# Patient Record
Sex: Female | Born: 1950 | Race: White | Hispanic: No | Marital: Married | Smoking: Never smoker
Health system: Southern US, Community
[De-identification: ages and names within clinical notes are randomized; demographics above are authoritative.]

## PROBLEM LIST (undated history)

## (undated) DIAGNOSIS — E119 Type 2 diabetes mellitus without complications: Secondary | ICD-10-CM

## (undated) DIAGNOSIS — E785 Hyperlipidemia, unspecified: Secondary | ICD-10-CM

## (undated) DIAGNOSIS — E039 Hypothyroidism, unspecified: Secondary | ICD-10-CM

## (undated) DIAGNOSIS — M1990 Unspecified osteoarthritis, unspecified site: Secondary | ICD-10-CM

## (undated) DIAGNOSIS — R918 Other nonspecific abnormal finding of lung field: Secondary | ICD-10-CM

## (undated) DIAGNOSIS — R569 Unspecified convulsions: Secondary | ICD-10-CM

## (undated) DIAGNOSIS — N6019 Diffuse cystic mastopathy of unspecified breast: Secondary | ICD-10-CM

## (undated) DIAGNOSIS — I10 Essential (primary) hypertension: Secondary | ICD-10-CM

## (undated) DIAGNOSIS — B009 Herpesviral infection, unspecified: Secondary | ICD-10-CM

## (undated) DIAGNOSIS — G4730 Sleep apnea, unspecified: Secondary | ICD-10-CM

## (undated) DIAGNOSIS — K5790 Diverticulosis of intestine, part unspecified, without perforation or abscess without bleeding: Secondary | ICD-10-CM

## (undated) DIAGNOSIS — E7800 Pure hypercholesterolemia, unspecified: Secondary | ICD-10-CM

## (undated) DIAGNOSIS — F40240 Claustrophobia: Secondary | ICD-10-CM

## (undated) DIAGNOSIS — M75100 Unspecified rotator cuff tear or rupture of unspecified shoulder, not specified as traumatic: Secondary | ICD-10-CM

## (undated) DIAGNOSIS — R7303 Prediabetes: Secondary | ICD-10-CM

## (undated) DIAGNOSIS — K219 Gastro-esophageal reflux disease without esophagitis: Secondary | ICD-10-CM

## (undated) DIAGNOSIS — E559 Vitamin D deficiency, unspecified: Secondary | ICD-10-CM

## (undated) DIAGNOSIS — D649 Anemia, unspecified: Secondary | ICD-10-CM

## (undated) HISTORY — PX: KNEE ARTHROSCOPY: SHX127

## (undated) HISTORY — PX: DG FOOT HEEL (ARMC HX): HXRAD1517

## (undated) HISTORY — PX: CHOLECYSTECTOMY: SHX55

## (undated) HISTORY — PX: ABDOMINAL HYSTERECTOMY: SHX81

## (undated) HISTORY — PX: DILATION AND CURETTAGE OF UTERUS: SHX78

## (undated) HISTORY — PX: BREAST EXCISIONAL BIOPSY: SUR124

## (undated) HISTORY — PX: JOINT REPLACEMENT: SHX530

---

## 2005-02-14 ENCOUNTER — Ambulatory Visit: Payer: Self-pay

## 2006-03-11 ENCOUNTER — Ambulatory Visit: Payer: Self-pay

## 2007-04-20 ENCOUNTER — Ambulatory Visit: Payer: Self-pay | Admitting: Internal Medicine

## 2008-03-13 ENCOUNTER — Observation Stay: Payer: Self-pay | Admitting: Internal Medicine

## 2008-03-18 ENCOUNTER — Emergency Department: Payer: Self-pay | Admitting: Emergency Medicine

## 2008-03-18 ENCOUNTER — Other Ambulatory Visit: Payer: Self-pay

## 2008-06-27 ENCOUNTER — Ambulatory Visit: Payer: Self-pay | Admitting: Internal Medicine

## 2009-03-14 ENCOUNTER — Ambulatory Visit: Payer: Self-pay | Admitting: Orthopedic Surgery

## 2009-03-15 ENCOUNTER — Ambulatory Visit: Payer: Self-pay | Admitting: Orthopedic Surgery

## 2009-06-29 ENCOUNTER — Ambulatory Visit: Payer: Self-pay | Admitting: Internal Medicine

## 2010-06-28 ENCOUNTER — Emergency Department: Payer: Self-pay | Admitting: Unknown Physician Specialty

## 2010-07-01 ENCOUNTER — Ambulatory Visit: Payer: Self-pay | Admitting: Internal Medicine

## 2010-08-04 ENCOUNTER — Encounter: Payer: Self-pay | Admitting: Orthopedic Surgery

## 2010-09-26 ENCOUNTER — Ambulatory Visit: Payer: Self-pay | Admitting: Internal Medicine

## 2011-01-27 ENCOUNTER — Ambulatory Visit: Payer: Self-pay | Admitting: Internal Medicine

## 2011-06-03 ENCOUNTER — Ambulatory Visit: Payer: Self-pay | Admitting: Internal Medicine

## 2011-07-31 ENCOUNTER — Ambulatory Visit: Payer: Self-pay | Admitting: Internal Medicine

## 2012-02-09 ENCOUNTER — Ambulatory Visit: Payer: Self-pay | Admitting: Internal Medicine

## 2012-03-14 ENCOUNTER — Emergency Department: Payer: Self-pay | Admitting: Internal Medicine

## 2012-06-03 ENCOUNTER — Ambulatory Visit: Payer: Self-pay | Admitting: Internal Medicine

## 2012-08-19 ENCOUNTER — Ambulatory Visit: Payer: Self-pay | Admitting: Internal Medicine

## 2012-12-06 ENCOUNTER — Emergency Department: Payer: Self-pay | Admitting: Emergency Medicine

## 2012-12-06 LAB — COMPREHENSIVE METABOLIC PANEL
Albumin: 3.8 g/dL (ref 3.4–5.0)
Alkaline Phosphatase: 75 U/L (ref 50–136)
Anion Gap: 8 (ref 7–16)
BUN: 10 mg/dL (ref 7–18)
Bilirubin,Total: 0.4 mg/dL (ref 0.2–1.0)
Calcium, Total: 9 mg/dL (ref 8.5–10.1)
Chloride: 107 mmol/L (ref 98–107)
Co2: 26 mmol/L (ref 21–32)
Creatinine: 0.76 mg/dL (ref 0.60–1.30)
EGFR (African American): >60
EGFR (Non-African Amer.): >60
Glucose: 118 mg/dL — ABNORMAL HIGH (ref 65–99)
Osmolality: 281 (ref 275–301)
Potassium: 3.6 mmol/L (ref 3.5–5.1)
SGOT(AST): 31 U/L (ref 15–37)
SGPT (ALT): 26 U/L (ref 12–78)
Sodium: 141 mmol/L (ref 136–145)
Total Protein: 7.6 g/dL (ref 6.4–8.2)

## 2012-12-06 LAB — CBC
HCT: 38.4 % (ref 35.0–47.0)
HGB: 13.1 g/dL (ref 12.0–16.0)
MCH: 31.3 pg (ref 26.0–34.0)
MCHC: 34.2 g/dL (ref 32.0–36.0)
MCV: 91 fL (ref 80–100)
Platelet: 245 10*3/uL (ref 150–440)
RBC: 4.2 10*6/uL (ref 3.80–5.20)
RDW: 14.7 % — ABNORMAL HIGH (ref 11.5–14.5)
WBC: 7.3 10*3/uL (ref 3.6–11.0)

## 2012-12-31 ENCOUNTER — Ambulatory Visit: Payer: Self-pay | Admitting: Internal Medicine

## 2013-01-26 ENCOUNTER — Ambulatory Visit: Payer: Self-pay | Admitting: Gastroenterology

## 2013-01-27 LAB — PATHOLOGY REPORT

## 2013-02-15 ENCOUNTER — Ambulatory Visit: Payer: Self-pay | Admitting: Internal Medicine

## 2013-06-07 ENCOUNTER — Ambulatory Visit: Payer: Self-pay | Admitting: Internal Medicine

## 2014-02-01 ENCOUNTER — Emergency Department: Payer: Self-pay | Admitting: Emergency Medicine

## 2014-02-01 LAB — CBC WITH DIFFERENTIAL/PLATELET
Basophil #: 0.1 10*3/uL (ref 0.0–0.1)
Basophil %: 0.9 %
Eosinophil #: 0.2 10*3/uL (ref 0.0–0.7)
Eosinophil %: 2.3 %
HCT: 39.3 % (ref 35.0–47.0)
HGB: 12.9 g/dL (ref 12.0–16.0)
Lymphocyte #: 4.8 10*3/uL — ABNORMAL HIGH (ref 1.0–3.6)
Lymphocyte %: 52 %
MCH: 31.2 pg (ref 26.0–34.0)
MCHC: 32.8 g/dL (ref 32.0–36.0)
MCV: 95 fL (ref 80–100)
Monocyte #: 0.6 x10 3/mm (ref 0.2–0.9)
Monocyte %: 6.6 %
Neutrophil #: 3.5 10*3/uL (ref 1.4–6.5)
Neutrophil %: 38.2 %
Platelet: 269 10*3/uL (ref 150–440)
RBC: 4.13 10*6/uL (ref 3.80–5.20)
RDW: 13.7 % (ref 11.5–14.5)
WBC: 9.3 10*3/uL (ref 3.6–11.0)

## 2014-02-01 LAB — BASIC METABOLIC PANEL
Anion Gap: 9 (ref 7–16)
BUN: 25 mg/dL — ABNORMAL HIGH (ref 7–18)
Calcium, Total: 8.6 mg/dL (ref 8.5–10.1)
Chloride: 107 mmol/L (ref 98–107)
Co2: 24 mmol/L (ref 21–32)
Creatinine: 1.19 mg/dL (ref 0.60–1.30)
EGFR (African American): 56 — ABNORMAL LOW
EGFR (Non-African Amer.): 48 — ABNORMAL LOW
Glucose: 127 mg/dL — ABNORMAL HIGH (ref 65–99)
Osmolality: 285 (ref 275–301)
Potassium: 3.5 mmol/L (ref 3.5–5.1)
Sodium: 140 mmol/L (ref 136–145)

## 2014-05-26 DIAGNOSIS — E7800 Pure hypercholesterolemia, unspecified: Secondary | ICD-10-CM | POA: Insufficient documentation

## 2014-06-12 ENCOUNTER — Ambulatory Visit: Payer: Self-pay | Admitting: Internal Medicine

## 2014-06-26 ENCOUNTER — Emergency Department: Payer: Self-pay | Admitting: Emergency Medicine

## 2015-03-21 ENCOUNTER — Encounter: Payer: Self-pay | Admitting: *Deleted

## 2015-03-22 ENCOUNTER — Encounter
Admission: RE | Disposition: A | Discharge status: Home / Self Care | Payer: Self-pay | Source: Ambulatory Visit | Attending: Gastroenterology

## 2015-03-22 ENCOUNTER — Ambulatory Visit
Admission: RE | Admit: 2015-03-22 | Discharge: 2015-03-22 | Disposition: A | Discharge status: Home / Self Care | Payer: BLUE CROSS/BLUE SHIELD | Source: Ambulatory Visit | Attending: Gastroenterology | Admitting: Gastroenterology

## 2015-03-22 ENCOUNTER — Ambulatory Visit: Payer: BLUE CROSS/BLUE SHIELD | Admitting: Anesthesiology

## 2015-03-22 DIAGNOSIS — R1310 Dysphagia, unspecified: Secondary | ICD-10-CM | POA: Insufficient documentation

## 2015-03-22 DIAGNOSIS — K635 Polyp of colon: Secondary | ICD-10-CM | POA: Insufficient documentation

## 2015-03-22 DIAGNOSIS — K219 Gastro-esophageal reflux disease without esophagitis: Secondary | ICD-10-CM | POA: Insufficient documentation

## 2015-03-22 DIAGNOSIS — Z6837 Body mass index (BMI) 37.0-37.9, adult: Secondary | ICD-10-CM | POA: Diagnosis not present

## 2015-03-22 DIAGNOSIS — E119 Type 2 diabetes mellitus without complications: Secondary | ICD-10-CM | POA: Diagnosis not present

## 2015-03-22 DIAGNOSIS — K5730 Diverticulosis of large intestine without perforation or abscess without bleeding: Secondary | ICD-10-CM | POA: Insufficient documentation

## 2015-03-22 DIAGNOSIS — I10 Essential (primary) hypertension: Secondary | ICD-10-CM | POA: Diagnosis not present

## 2015-03-22 DIAGNOSIS — E559 Vitamin D deficiency, unspecified: Secondary | ICD-10-CM | POA: Insufficient documentation

## 2015-03-22 DIAGNOSIS — E785 Hyperlipidemia, unspecified: Secondary | ICD-10-CM | POA: Insufficient documentation

## 2015-03-22 DIAGNOSIS — E780 Pure hypercholesterolemia: Secondary | ICD-10-CM | POA: Insufficient documentation

## 2015-03-22 DIAGNOSIS — Z800 Family history of malignant neoplasm of digestive organs: Secondary | ICD-10-CM | POA: Diagnosis not present

## 2015-03-22 DIAGNOSIS — G4730 Sleep apnea, unspecified: Secondary | ICD-10-CM | POA: Diagnosis not present

## 2015-03-22 DIAGNOSIS — E039 Hypothyroidism, unspecified: Secondary | ICD-10-CM | POA: Diagnosis not present

## 2015-03-22 HISTORY — DX: Type 2 diabetes mellitus without complications: E11.9

## 2015-03-22 HISTORY — DX: Vitamin D deficiency, unspecified: E55.9

## 2015-03-22 HISTORY — DX: Herpesviral infection, unspecified: B00.9

## 2015-03-22 HISTORY — DX: Diffuse cystic mastopathy of unspecified breast: N60.19

## 2015-03-22 HISTORY — DX: Gastro-esophageal reflux disease without esophagitis: K21.9

## 2015-03-22 HISTORY — DX: Hyperlipidemia, unspecified: E78.5

## 2015-03-22 HISTORY — PX: ESOPHAGOGASTRODUODENOSCOPY (EGD) WITH PROPOFOL: SHX5813

## 2015-03-22 HISTORY — DX: Pure hypercholesterolemia, unspecified: E78.00

## 2015-03-22 HISTORY — DX: Sleep apnea, unspecified: G47.30

## 2015-03-22 HISTORY — DX: Essential (primary) hypertension: I10

## 2015-03-22 HISTORY — DX: Hypothyroidism, unspecified: E03.9

## 2015-03-22 HISTORY — PX: COLONOSCOPY WITH PROPOFOL: SHX5780

## 2015-03-22 SURGERY — COLONOSCOPY WITH PROPOFOL
Anesthesia: General

## 2015-03-22 MED ORDER — SODIUM CHLORIDE 0.9 % IV SOLN
INTRAVENOUS | Status: DC
Start: 2015-03-22 — End: 2015-03-22
  Administered 2015-03-22 (×2): via INTRAVENOUS

## 2015-03-22 MED ORDER — PROPOFOL INFUSION 10 MG/ML OPTIME
INTRAVENOUS | Status: DC | PRN
  Administered 2015-03-22: 125 ug/kg/min via INTRAVENOUS

## 2015-03-22 MED ORDER — PROPOFOL 10 MG/ML IV BOLUS
INTRAVENOUS | Status: DC | PRN
  Administered 2015-03-22: 100 mg via INTRAVENOUS

## 2015-03-22 MED ORDER — GLYCOPYRROLATE 0.2 MG/ML IJ SOLN
INTRAMUSCULAR | Status: DC | PRN
  Administered 2015-03-22: 0.2 mg via INTRAVENOUS

## 2015-03-22 MED ORDER — LIDOCAINE HCL (CARDIAC) 20 MG/ML IV SOLN
INTRAVENOUS | Status: DC | PRN
  Administered 2015-03-22: 20 mg via INTRAVENOUS

## 2015-03-22 NOTE — Transfer of Care (Signed)
Immediate Anesthesia Transfer of Care Note  Patient: Dana Morton  Procedure(s) Performed: Procedure(s): COLONOSCOPY WITH PROPOFOL (N/A) ESOPHAGOGASTRODUODENOSCOPY (EGD) WITH PROPOFOL (N/A)  Patient Location: Endoscopy Unit  Anesthesia Type:General  Level of Consciousness: awake and oriented  Airway & Oxygen Therapy: Patient Spontanous Breathing and Patient connected to nasal cannula oxygen  Post-op Assessment: Report given to RN and Post -op Vital signs reviewed and stable  Post vital signs: Reviewed and stable  Last Vitals:  Filed Vitals:   03/22/15 0953  BP: 133/65  Pulse: 69  Temp: 35.9 C  Resp: 20    Complications: No apparent anesthesia complications

## 2015-03-22 NOTE — Anesthesia Preprocedure Evaluation (Signed)
Anesthesia Evaluation  Patient identified by MRN, date of birth, ID band Patient awake    Reviewed: Allergy & Precautions, H&P , NPO status , Patient's Chart, lab work & pertinent test results, reviewed documented beta blocker date and time   History of Anesthesia Complications Negative for: history of anesthetic complications  Airway Mallampati: IV  TM Distance: >3 FB Neck ROM: full  Mouth opening: Limited Mouth Opening  Dental no notable dental hx. (+) Teeth Intact   Pulmonary neg shortness of breath, sleep apnea , neg COPD, neg recent URI,    Pulmonary exam normal breath sounds clear to auscultation       Cardiovascular Exercise Tolerance: Good hypertension, On Medications (-) angina(-) CAD, (-) Past MI, (-) Cardiac Stents and (-) CABG Normal cardiovascular exam(-) dysrhythmias (-) Valvular Problems/Murmurs Rhythm:regular Rate:Normal     Neuro/Psych negative neurological ROS  negative psych ROS   GI/Hepatic Neg liver ROS, GERD  Medicated and Controlled,  Endo/Other  diabetesHypothyroidism Morbid obesity  Renal/GU negative Renal ROS  negative genitourinary   Musculoskeletal   Abdominal   Peds  Hematology negative hematology ROS (+)   Anesthesia Other Findings Past Medical History:   Hypertension                                                 Herpes simplex                                               Fibrocystic disease of breast                                Hyperlipidemia                                               Vitamin D deficiency                                         Hypothyroidism                                               Diabetes mellitus without complication                       Hypercholesteremia                                           Sleep apnea                                                  GERD (gastroesophageal reflux disease)  Reproductive/Obstetrics negative OB ROS                             Anesthesia Physical Anesthesia Plan  ASA: III  Anesthesia Plan: General   Post-op Pain Management:    Induction:   Airway Management Planned:   Additional Equipment:   Intra-op Plan:   Post-operative Plan:   Informed Consent: I have reviewed the patients History and Physical, chart, labs and discussed the procedure including the risks, benefits and alternatives for the proposed anesthesia with the patient or authorized representative who has indicated his/her understanding and acceptance.   Dental Advisory Given  Plan Discussed with: Anesthesiologist, CRNA and Surgeon  Anesthesia Plan Comments:         Anesthesia Quick Evaluation

## 2015-03-22 NOTE — Op Note (Signed)
[facility] Gastroenterology Patient Name: Dana Morton Procedure Date: 03/22/2015 10:49 AM MRN: [id number] Account #: [id number] Date of Birth: [birth year] Admit Type: Outpatient Age: 64 Room: [facility] ENDO ROOM 4 Gender: Female Note Status: Finalized Procedure:         Upper GI endoscopy Indications:       Dysphagia Providers:         Lupita Dawn. Candace Cruise, MD Referring MD:      Ramonita Lab, MD (Referring MD) Medicines:         Monitored Anesthesia Care Complications:     No immediate complications. Procedure:         Pre-Anesthesia Assessment:                    - Prior to the procedure, a History and Physical was                     performed, and patient medications, allergies and                     sensitivities were reviewed. The patient's tolerance of                     previous anesthesia was reviewed.                    - The risks and benefits of the procedure and the sedation                     options and risks were discussed with the patient. All                     questions were answered and informed consent was obtained.                    - After reviewing the risks and benefits, the patient was                     deemed in satisfactory condition to undergo the procedure.                    After obtaining informed consent, the endoscope was passed                     under direct vision. Throughout the procedure, the                     patient's blood pressure, pulse, and oxygen saturations                     were monitored continuously. The Endoscope was introduced                     through the mouth, and advanced to the second part of                     duodenum. The upper GI endoscopy was accomplished without                     difficulty. The patient tolerated the procedure well. Findings:      No endoscopic abnormality was evident in the esophagus to explain the       patient's complaint of dysphagia. It was decided, however, to proceed   with dilation of the entire esophagus. The scope  was withdrawn. Dilation       was performed with a Maloney dilator with mild resistance at 40 Fr.      The entire examined stomach was normal.      The examined duodenum was normal. Impression:        - No endoscopic esophageal abnormality to explain                     patient's dysphagia. Esophagus dilated. Dilated.                    - Normal stomach.                    - Normal examined duodenum.                    - No specimens collected. Recommendation:    - Discharge patient to home.                    - Observe patient's clinical course.                    - The findings and recommendations were discussed with the                     patient. Procedure Code(s): --- Professional ---                    [phone], Esophagogastroduodenoscopy, flexible, transoral;                     diagnostic, including collection of specimen(s) by                     brushing or washing, when performed (separate procedure)                    43450, Dilation of esophagus, by unguided sound or bougie,                     single or multiple passes Diagnosis Code(s): --- Professional ---                    R13.10, Dysphagia, unspecified CPT copyright 2014 American Medical Association. All rights reserved. The codes documented in this report are preliminary and upon coder review may  be revised to meet current compliance requirements. Hulen Luster, MD 03/22/2015 11:15:45 AM This report has been signed electronically. Number of Addenda: 0 Note Initiated On: 03/22/2015 10:49 AM      [facility]

## 2015-03-22 NOTE — H&P (Signed)
Primary Care Physician:  Adin Hector, MD Primary Gastroenterologist:  Dr. Candace Cruise  Pre-Procedure History & Physical: HPI:  Dana Morton is a 64 y.o. female is here for an EGD/colonoscopy.   Past Medical History  Diagnosis Date  . Hypertension   . Herpes simplex   . Fibrocystic disease of breast   . Hyperlipidemia   . Vitamin D deficiency   . Hypothyroidism   . Diabetes mellitus without complication   . Hypercholesteremia     No past surgical history on file.  Prior to Admission medications   Medication Sig Start Date End Date Taking? Authorizing Provider  acetaminophen (TYLENOL) 500 MG tablet Take 500 mg by mouth every 6 (six) hours as needed.   Yes Historical Provider, MD  bisoprolol (ZEBETA) 5 MG tablet Take 5 mg by mouth daily.   Yes Historical Provider, MD  BLACK COHOSH PO Take by mouth.   Yes Historical Provider, MD  calcium carbonate (OS-CAL) 600 MG TABS tablet Take 600 mg by mouth 2 (two) times daily with a meal.   Yes Historical Provider, MD  Cetirizine HCl (ZYRTEC ALLERGY) 10 MG CAPS Take by mouth.   Yes Historical Provider, MD  cholecalciferol (VITAMIN D) 400 UNITS TABS tablet Take 400 Units by mouth.   Yes Historical Provider, MD  hydrochlorothiazide (HYDRODIURIL) 25 MG tablet Take 25 mg by mouth daily.   Yes Historical Provider, MD  ibuprofen (ADVIL,MOTRIN) 200 MG tablet Take 200 mg by mouth every 6 (six) hours as needed.   Yes Historical Provider, MD  levothyroxine (SYNTHROID, LEVOTHROID) 125 MCG tablet Take 125 mcg by mouth daily before breakfast.   Yes Historical Provider, MD  lisinopril (PRINIVIL,ZESTRIL) 40 MG tablet Take 40 mg by mouth daily.   Yes Historical Provider, MD  lovastatin (MEVACOR) 40 MG tablet Take 40 mg by mouth at bedtime.   Yes Historical Provider, MD  Multiple Vitamins-Minerals (CENTRUM SILVER PO) Take by mouth.   Yes Historical Provider, MD  omeprazole (PRILOSEC) 20 MG capsule Take 20 mg by mouth daily.   Yes Historical Provider, MD     Allergies as of 02/28/2015  . (Not on File)    No family history on file.  Social History   Social History  . Marital Status: Married    Spouse Name: N/A  . Number of Children: N/A  . Years of Education: N/A   Occupational History  . Not on file.   Social History Main Topics  . Smoking status: Not on file  . Smokeless tobacco: Not on file  . Alcohol Use: Not on file  . Drug Use: Not on file  . Sexual Activity: Not on file   Other Topics Concern  . Not on file   Social History Narrative  . No narrative on file    Review of Systems: See HPI, otherwise negative ROS  Physical Exam: There were no vitals taken for this visit. General:   Alert,  pleasant and cooperative in NAD Head:  Normocephalic and atraumatic. Neck:  Supple; no masses or thyromegaly. Lungs:  Clear throughout to auscultation.    Heart:  Regular rate and rhythm. Abdomen:  Soft, nontender and nondistended. Normal bowel sounds, without guarding, and without rebound.   Neurologic:  Alert and  oriented x4;  grossly normal neurologically.  Impression/Plan: Dana Morton is here for an EGD/colonoscopy to be performed for rectal bleeding, family hx of colon cancer, and dysphagia.  Risks, benefits, limitations, and alternatives regarding EGD/colonoscopy have been reviewed  with the patient.  Questions have been answered.  All parties agreeable.   Janey Petron, Lupita Dawn, MD  03/22/2015, 9:33 AM

## 2015-03-22 NOTE — Op Note (Signed)
[facility] Gastroenterology Patient Name: Dana Morton Procedure Date: 03/22/2015 10:49 AM MRN: [id number] Account #: [id number] Date of Birth: 28-Aug-1950 Admit Type: Outpatient Age: 64 Room: [facility] ENDO ROOM 4 Gender: Female Note Status: Finalized Procedure:         Colonoscopy Indications:       Rectal bleeding, Family history of colon cancer in                     multiple first-degree relatives Providers:         Lupita Dawn. Candace Cruise, MD Referring MD:      Ramonita Lab, MD (Referring MD) Medicines:         Monitored Anesthesia Care Complications:     No immediate complications. Procedure:         Pre-Anesthesia Assessment:                    - Prior to the procedure, a History and Physical was                     performed, and patient medications, allergies and                     sensitivities were reviewed. The patient's tolerance of                     previous anesthesia was reviewed.                    - The risks and benefits of the procedure and the sedation                     options and risks were discussed with the patient. All                     questions were answered and informed consent was obtained.                    - After reviewing the risks and benefits, the patient was                     deemed in satisfactory condition to undergo the procedure.                    After obtaining informed consent, the colonoscope was                     passed under direct vision. Throughout the procedure, the                     patient's blood pressure, pulse, and oxygen saturations                     were monitored continuously. The Olympus CF-Q160AL                     colonoscope (S#. [id number]) was introduced through the anus                     and advanced to the the cecum, identified by appendiceal                     orifice and ileocecal valve. The entire colon was examined. Findings:      Multiple small and large-mouthed diverticula were found in the  sigmoid       colon.      Two sessile polyps were found in the sigmoid colon. The polyps were       small in size. These polyps were removed with a cold snare. Resection       and retrieval were complete.      The exam was otherwise without abnormality. Impression:        - Diverticulosis in the sigmoid colon.                    - Two small polyps in the sigmoid colon. Resected and                     retrieved.                    - The examination was otherwise normal. Recommendation:    - Discharge patient to home.                    - Await pathology results.                    - Repeat colonoscopy in 5 years for surveillance based on                     pathology results.                    - The findings and recommendations were discussed with the                     patient. Procedure Code(s): --- Professional ---                    [phone], Colonoscopy, flexible; with removal of tumor(s),                     polyp(s), or other lesion(s) by snare technique Diagnosis Code(s): --- Professional ---                    D12.5, Benign neoplasm of sigmoid colon                    K62.5, Hemorrhage of anus and rectum                    Z80.0, Family history of malignant neoplasm of digestive                     organs                    K57.30, Diverticulosis of large intestine without                     perforation or abscess without bleeding CPT copyright 2014 American Medical Association. All rights reserved. The codes documented in this report are preliminary and upon coder review may  be revised to meet current compliance requirements. Hulen Luster, MD 03/22/2015 11:35:11 AM This report has been signed electronically. Number of Addenda: 0 Note Initiated On: 03/22/2015 10:49 AM Scope Withdrawal Time: 0 hours 12 minutes 2 seconds  Total Procedure Duration: 0 hours 13 minutes 58 seconds       [facility]

## 2015-03-23 ENCOUNTER — Encounter: Payer: Self-pay | Admitting: Gastroenterology

## 2015-03-23 LAB — SURGICAL PATHOLOGY

## 2015-03-23 NOTE — Anesthesia Postprocedure Evaluation (Signed)
  Anesthesia Post-op Note  Patient: Dana Morton  Procedure(s) Performed: Procedure(s): COLONOSCOPY WITH PROPOFOL (N/A) ESOPHAGOGASTRODUODENOSCOPY (EGD) WITH PROPOFOL (N/A)  Anesthesia type:General  Patient location: PACU  Post pain: Pain level controlled  Post assessment: Post-op Vital signs reviewed, Patient's Cardiovascular Status Stable, Respiratory Function Stable, Patent Airway and No signs of Nausea or vomiting  Post vital signs: Reviewed and stable  Last Vitals:  Filed Vitals:   03/22/15 1210  BP: 110/51  Pulse: 68  Temp:   Resp: 23    Level of consciousness: awake, alert  and patient cooperative  Complications: No apparent anesthesia complications

## 2015-05-27 ENCOUNTER — Encounter: Payer: Self-pay | Admitting: *Deleted

## 2015-05-27 ENCOUNTER — Emergency Department
Admission: EM | Admit: 2015-05-27 | Discharge: 2015-05-27 | Disposition: A | Discharge status: Home / Self Care | Payer: 59 | Attending: Emergency Medicine | Admitting: Emergency Medicine

## 2015-05-27 DIAGNOSIS — E119 Type 2 diabetes mellitus without complications: Secondary | ICD-10-CM | POA: Insufficient documentation

## 2015-05-27 DIAGNOSIS — X501XXA Overexertion from prolonged static or awkward postures, initial encounter: Secondary | ICD-10-CM | POA: Insufficient documentation

## 2015-05-27 DIAGNOSIS — Y9289 Other specified places as the place of occurrence of the external cause: Secondary | ICD-10-CM | POA: Diagnosis not present

## 2015-05-27 DIAGNOSIS — Y998 Other external cause status: Secondary | ICD-10-CM | POA: Diagnosis not present

## 2015-05-27 DIAGNOSIS — Z79899 Other long term (current) drug therapy: Secondary | ICD-10-CM | POA: Insufficient documentation

## 2015-05-27 DIAGNOSIS — S99929A Unspecified injury of unspecified foot, initial encounter: Secondary | ICD-10-CM | POA: Diagnosis not present

## 2015-05-27 DIAGNOSIS — I10 Essential (primary) hypertension: Secondary | ICD-10-CM | POA: Insufficient documentation

## 2015-05-27 DIAGNOSIS — S3992XA Unspecified injury of lower back, initial encounter: Secondary | ICD-10-CM | POA: Diagnosis not present

## 2015-05-27 DIAGNOSIS — M545 Low back pain: Secondary | ICD-10-CM

## 2015-05-27 DIAGNOSIS — Y9389 Activity, other specified: Secondary | ICD-10-CM | POA: Insufficient documentation

## 2015-05-27 DIAGNOSIS — S79921A Unspecified injury of right thigh, initial encounter: Secondary | ICD-10-CM | POA: Insufficient documentation

## 2015-05-27 MED ORDER — PREDNISONE 20 MG PO TABS: 20.0000 mg | ORAL_TABLET | Freq: Every day | ORAL | Status: DC

## 2015-05-27 MED ORDER — PREDNISONE 20 MG PO TABS
20.0000 mg | ORAL_TABLET | Freq: Once | ORAL | Status: AC
  Administered 2015-05-27: 20 mg via ORAL
  Filled 2015-05-27: qty 1

## 2015-05-27 MED ORDER — IBUPROFEN 600 MG PO TABS
600.0000 mg | ORAL_TABLET | ORAL | Status: AC
  Administered 2015-05-27: 600 mg via ORAL
  Filled 2015-05-27: qty 1

## 2015-05-27 NOTE — ED Notes (Signed)
Pt ambulated to wheel chair. 

## 2015-05-27 NOTE — ED Provider Notes (Signed)
[facility] Emergency Department Provider Note REMINDER - THIS NOTE IS NOT A FINAL MEDICAL RECORD UNTIL IT IS SIGNED. UNTIL THEN, THE CONTENT BELOW MAY REFLECT INFORMATION FROM A DOCUMENTATION TEMPLATE, NOT THE ACTUAL PATIENT VISIT. ____________________________________________  Time seen: Approximately 7:38 AM  I have reviewed the triage vital signs and the nursing notes.   HISTORY  Chief Complaint Leg Pain    HPI Dana Morton is a 64 y.o. female . History of hypertension, hypothyroidism, low back pain.  Patient presents today states that about 2 weeks ago she slipped out of bed, but did not fall or injure herself but sort of twisted and since that time she been having shooting pains from the right lower back into the right buttock and back of the right thigh. He says it radiates to the right knee.  Pain is severe at times, worse with standing or walking. This morning she had fairly severe sharp pain radiating down to the level of the knee, she took 2 hydrocodone tablets and a gabapentin reports at the present time her pain is much improved. She does, however have significant on pain when she goes to walk. She denies any numbness or tingling, no weakness in the legs. No trouble with her bowel or bladder. She has not expressed any abdominal pain, nausea, vomiting or fevers.   She is no previous history of any cancer. She's never fractured or broken her back. She saw her primary care doctor who placed her on pain medications as well as did an x-ray of the low back and was told this was normal at Dr. Olin Pia office.    Past Medical History  Diagnosis Date  . Hypertension   . Herpes simplex   . Fibrocystic disease of breast   . Hyperlipidemia   . Vitamin D deficiency   . Hypothyroidism   . Diabetes mellitus without complication ([place])   . Hypercholesteremia   . Sleep apnea   . GERD (gastroesophageal reflux disease)     There are no active problems to  display for this patient.   Past Surgical History  Procedure Laterality Date  . Abdominal hysterectomy    . Cholecystectomy    . Dilation and curettage of uterus    . Colonoscopy with propofol N/A 03/22/2015    Procedure: COLONOSCOPY WITH PROPOFOL;  Surgeon: Hulen Luster, MD;  [facility] ENDOSCOPY;  Service: Gastroenterology;  Laterality: N/A;  . Esophagogastroduodenoscopy (egd) with propofol N/A 03/22/2015    Procedure: ESOPHAGOGASTRODUODENOSCOPY (EGD) WITH PROPOFOL;  Surgeon: Hulen Luster, MD;  Location: [facility] ENDOSCOPY;  Service: Gastroenterology;  Laterality: N/A;    Current Outpatient Rx  Name  Route  Sig  Dispense  Refill  . acetaminophen (TYLENOL) 500 MG tablet   Oral   Take 500 mg by mouth every 6 (six) hours as needed.         . bisoprolol (ZEBETA) 5 MG tablet   Oral   Take 5 mg by mouth daily.         Marland Kitchen BLACK COHOSH PO   Oral   Take by mouth.         . calcium carbonate (OS-CAL) 600 MG TABS tablet   Oral   Take 600 mg by mouth 2 (two) times daily with a meal.         . Cetirizine HCl (ZYRTEC ALLERGY) 10 MG CAPS   Oral   Take by mouth.         . cholecalciferol (VITAMIN D)  400 UNITS TABS tablet   Oral   Take 400 Units by mouth.         . hydrochlorothiazide (HYDRODIURIL) 25 MG tablet   Oral   Take 25 mg by mouth daily.         Marland Kitchen ibuprofen (ADVIL,MOTRIN) 200 MG tablet   Oral   Take 200 mg by mouth every 6 (six) hours as needed.         Marland Kitchen levothyroxine (SYNTHROID, LEVOTHROID) 125 MCG tablet   Oral   Take 125 mcg by mouth daily before breakfast.         . lisinopril (PRINIVIL,ZESTRIL) 40 MG tablet   Oral   Take 40 mg by mouth daily.         Marland Kitchen lovastatin (MEVACOR) 40 MG tablet   Oral   Take 40 mg by mouth at bedtime.         . Multiple Vitamins-Minerals (CENTRUM SILVER PO)   Oral   Take by mouth.         Marland Kitchen omeprazole (PRILOSEC) 20 MG capsule   Oral   Take 20 mg by mouth daily.         . predniSONE (DELTASONE) 20 MG tablet    Oral   Take 1 tablet (20 mg total) by mouth daily with breakfast.   5 tablet   0     Allergies Codeine  History reviewed. No pertinent family history.  Social History Social History  Substance Use Topics  . Smoking status: Never Smoker   . Smokeless tobacco: None  . Alcohol Use: None    Review of Systems Constitutional: No fever/chills. Eyes: No visual changes. ENT: No sore throat. Cardiovascular: Denies chest pain. Respiratory: Denies shortness of breath. Gastrointestinal: No abdominal pain.  No nausea, no vomiting.  No diarrhea.  No constipation. Genitourinary: Negative for dysuria. MusculoskeletalPain lower legs or foot. in: Negative for rash. Neurological: Negative for headaches, focal weakness or numbness.  10-point ROS otherwise negative.  ____________________________________________   PHYSICAL EXAM:  VITAL SIGNS: ED Triage Vitals  Enc Vitals Group     BP 05/27/15 0637 130/67 mmHg     Pulse Rate 05/27/15 0637 75     Resp 05/27/15 0637 20     Temp 05/27/15 0637 98.1 F (36.7 C)     Temp Source 05/27/15 0637 Oral     SpO2 05/27/15 0637 92 %     Weight 05/27/15 0637 250 lb (113.399 kg)     Height 05/27/15 0637 5\' 6"  (1.676 m)     Head Cir --      Peak Flow --      Pain Score 05/27/15 0638 0     Pain Loc --      Pain Edu? --      Excl. in [place]? --    Constitutional: Alert and oriented. Well appearing and in no acute distress. Eyes: Conjunctivae are normal. PERRL. EOMI. Head: Atraumatic. Nose: No congestion/rhinnorhea. Mouth/Throat: Mucous membranes are moist.  Oropharynx non-erythematous. Neck: No stridor.  No thoracic tenderness. Patient does have mild low lumbar midline tenderness. Cardiovascular: Normal rate, regular rhythm. Grossly normal heart sounds.  Good peripheral circulation. Respiratory: Normal respiratory effort.  No retractions. Lungs CTAB. Gastrointestinal: Soft and nontender. No distention. No abdominal bruits. No CVA  tenderness. Musculoskeletal: No lower extremity tenderness nor edema.  No joint effusions.  Lower Extremities  No edema. Normal, dopplerable DP/PT pulses bilateral with good cap refill.  Normal neuro-motor function lower extremities bilateral.  RIGHT Right  lower extremity demonstrates normal strength, good use of all muscles. No edema bruising or contusions of the right hip, right knee, right ankle. Full range of motion of the right lower extremity without pain except when flexing at the right hip, she experiences return of shooting pain from the back into the back of the right thigh. No pain on axial loading. No evidence of trauma.  LEFT Left lower extremity demonstrates normal strength, good use of all muscles. No edema bruising or contusions of the hip,  knee, ankle. Full range of motion of the left lower extremity without pain. No pain on axial loading. No evidence of trauma.   Neurologic:  Normal speech and language. No gross focal neurologic deficits are appreciated. No gait instability. Skin:  Skin is warm, dry and intact. No rash noted. Psychiatric: Mood and affect are normal. Speech and behavior are normal.  ____________________________________________   LABS (all labs ordered are listed, but only abnormal results are displayed)  Labs Reviewed - No data to display ____________________________________________  EKG   ____________________________________________  RADIOLOGY   ____________________________________________   PROCEDURES  Procedure(s) performed: None  Critical Care performed: No  ____________________________________________   INITIAL IMPRESSION / ASSESSMENT AND PLAN / ED COURSE  Pertinent labs & imaging results that were available during my care of the patient were reviewed by me and considered in my medical decision making (see chart for details).  Patient presents for reevaluation of her right low back pain. She reports that primary concern is  pain control that she had a "thorough" evaluation by Dr. Caryl Comes. To my examination, I find no evidence of traumatic injury. No indication for repeat x-ray. She has no seemingly significant red flags for back pain. She has no significant risk factor for aneurysm or dissection. Her pain seems to be intermittent and worse with ambulation. She is reassuring neurologic and vascular exam. I do believe pain control is warranted, and her doctor has initiated a good regimen of hydrocodone and Neurontin. I will add NSAIDs including Motrin 600 mg up to 3 times a day which I discussed the patient, and we will also try low-dose steroid for the next 6 days. Patient is agreeable, we discuss careful back pain return precautions and follow-up care. She'll call Dr. Olin Pia office Monday as well as attempt to make a sooner appointment with orthopedics ____________________________________________   FINAL CLINICAL IMPRESSION(S) / ED DIAGNOSES  Final diagnoses:  Right low back pain, with sciatica presence unspecified      Delman Kitten, MD 05/27/15 [id number]

## 2015-05-27 NOTE — ED Notes (Signed)
Pt presents w/ c/o R back and leg pain. Pt slipped off her bed x 2 weeks ago, injuring R knee. Pt has a f/u appt. W/ back specialist for lack of resolution of pain and increased pain in R Leg and R back. Pt states this morning she had renewed R knee pain. Pt took hydrocodone x 2 tabs, gabapentin and a muscle relaxer that was RX'd to her for this pain. Pt continues to c/o decreased ability to move and continued pain. Pt states while sitting in wheelchair she is pain free.

## 2015-05-27 NOTE — Discharge Instructions (Signed)
You have been seen in the Emergency Department (ED)  today for back pain.  Your workup and exam have not shown any acute abnormalities and you are likely suffering from muscle strain or possible problems with your discs, but there is no treatment that will fix your symptoms at this time.  Please take Motrin (ibuprofen) as needed for your pain according to the instructions written on the box.  Alternatively, for the next five days you can take 600mg  three times daily with meals (it may upset your stomach).   Please follow up with your doctor as soon as possible regarding today's ED visit and your back pain.  Return to the ED for worsening back pain, fever, weakness or numbness of either leg, or if you develop either (1) an inability to urinate or have bowel movements, or (2) loss of your ability to control your bathroom functions (if you start having "accidents"), or if you develop other new symptoms that concern you.   Back Pain, Adult Back pain is very common in adults.The cause of back pain is rarely dangerous and the pain often gets better over time.The cause of your back pain may not be known. Some common causes of back pain include:  Strain of the muscles or ligaments supporting the spine.  Wear and tear (degeneration) of the spinal disks.  Arthritis.  Direct injury to the back. For many people, back pain may return. Since back pain is rarely dangerous, most people can learn to manage this condition on their own. HOME CARE INSTRUCTIONS Watch your back pain for any changes. The following actions may help to lessen any discomfort you are feeling:  Remain active. It is stressful on your back to sit or stand in one place for long periods of time. Do not sit, drive, or stand in one place for more than 30 minutes at a time. Take short walks on even surfaces as soon as you are able.Try to increase the length of time you walk each day.  Exercise regularly as directed by your health care  provider. Exercise helps your back heal faster. It also helps avoid future injury by keeping your muscles strong and flexible.  Do not stay in bed.Resting more than 1-2 days can delay your recovery.  Pay attention to your body when you bend and lift. The most comfortable positions are those that put less stress on your recovering back. Always use proper lifting techniques, including:  Bending your knees.  Keeping the load close to your body.  Avoiding twisting.  Find a comfortable position to sleep. Use a firm mattress and lie on your side with your knees slightly bent. If you lie on your back, put a pillow under your knees.  Avoid feeling anxious or stressed.Stress increases muscle tension and can worsen back pain.It is important to recognize when you are anxious or stressed and learn ways to manage it, such as with exercise.  Take medicines only as directed by your health care provider. Over-the-counter medicines to reduce pain and inflammation are often the most helpful.Your health care provider may prescribe muscle relaxant drugs.These medicines help dull your pain so you can more quickly return to your normal activities and healthy exercise.  Apply ice to the injured area:  Put ice in a plastic bag.  Place a towel between your skin and the bag.  Leave the ice on for 20 minutes, 2-3 times a day for the first 2-3 days. After that, ice and heat may be alternated to reduce  pain and spasms.  Maintain a healthy weight. Excess weight puts extra stress on your back and makes it difficult to maintain good posture. SEEK MEDICAL CARE IF:  You have pain that is not relieved with rest or medicine.  You have increasing pain going down into the legs or buttocks.  You have pain that does not improve in one week.  You have night pain.  You lose weight.  You have a fever or chills. SEEK IMMEDIATE MEDICAL CARE IF:   You develop new bowel or bladder control problems.  You have  unusual weakness or numbness in your arms or legs.  You develop nausea or vomiting.  You develop abdominal pain.  You feel faint.   This information is not intended to replace advice given to you by your health care provider. Make sure you discuss any questions you have with your health care provider.   Document Released: 06/30/2005 Document Revised: 07/21/2014 Document Reviewed: 11/01/2013 Elsevier Interactive Patient Education [employer].

## 2015-07-12 ENCOUNTER — Other Ambulatory Visit: Payer: Self-pay | Admitting: Orthopedic Surgery

## 2015-07-12 DIAGNOSIS — S83211A Bucket-handle tear of medial meniscus, current injury, right knee, initial encounter: Secondary | ICD-10-CM

## 2015-07-12 DIAGNOSIS — M2391 Unspecified internal derangement of right knee: Secondary | ICD-10-CM

## 2015-07-23 ENCOUNTER — Other Ambulatory Visit: Payer: Self-pay | Admitting: Neurosurgery

## 2015-07-23 DIAGNOSIS — M5416 Radiculopathy, lumbar region: Secondary | ICD-10-CM

## 2015-07-25 ENCOUNTER — Ambulatory Visit
Admission: RE | Admit: 2015-07-25 | Discharge: 2015-07-25 | Disposition: A | Discharge status: Home / Self Care | Payer: 59 | Source: Ambulatory Visit | Attending: Orthopedic Surgery | Admitting: Orthopedic Surgery

## 2015-07-25 DIAGNOSIS — M2391 Unspecified internal derangement of right knee: Secondary | ICD-10-CM | POA: Insufficient documentation

## 2015-07-25 DIAGNOSIS — S83211A Bucket-handle tear of medial meniscus, current injury, right knee, initial encounter: Secondary | ICD-10-CM

## 2015-07-25 DIAGNOSIS — W19XXXA Unspecified fall, initial encounter: Secondary | ICD-10-CM | POA: Insufficient documentation

## 2015-08-01 ENCOUNTER — Encounter: Payer: Self-pay | Admitting: *Deleted

## 2015-08-01 ENCOUNTER — Encounter
Admission: RE | Admit: 2015-08-01 | Discharge: 2015-08-01 | Disposition: A | Discharge status: Home / Self Care | Payer: Managed Care, Other (non HMO) | Source: Ambulatory Visit | Attending: Orthopedic Surgery | Admitting: Orthopedic Surgery

## 2015-08-01 DIAGNOSIS — E669 Obesity, unspecified: Secondary | ICD-10-CM | POA: Diagnosis not present

## 2015-08-01 DIAGNOSIS — G4730 Sleep apnea, unspecified: Secondary | ICD-10-CM | POA: Diagnosis not present

## 2015-08-01 DIAGNOSIS — Z90710 Acquired absence of both cervix and uterus: Secondary | ICD-10-CM | POA: Diagnosis not present

## 2015-08-01 DIAGNOSIS — E7800 Pure hypercholesterolemia, unspecified: Secondary | ICD-10-CM | POA: Diagnosis not present

## 2015-08-01 DIAGNOSIS — Z9049 Acquired absence of other specified parts of digestive tract: Secondary | ICD-10-CM | POA: Diagnosis not present

## 2015-08-01 DIAGNOSIS — M6751 Plica syndrome, right knee: Secondary | ICD-10-CM | POA: Diagnosis not present

## 2015-08-01 DIAGNOSIS — Z86010 Personal history of colonic polyps: Secondary | ICD-10-CM | POA: Diagnosis not present

## 2015-08-01 DIAGNOSIS — S83231A Complex tear of medial meniscus, current injury, right knee, initial encounter: Secondary | ICD-10-CM | POA: Diagnosis not present

## 2015-08-01 DIAGNOSIS — E039 Hypothyroidism, unspecified: Secondary | ICD-10-CM | POA: Diagnosis not present

## 2015-08-01 DIAGNOSIS — E785 Hyperlipidemia, unspecified: Secondary | ICD-10-CM | POA: Diagnosis not present

## 2015-08-01 DIAGNOSIS — N6019 Diffuse cystic mastopathy of unspecified breast: Secondary | ICD-10-CM | POA: Diagnosis not present

## 2015-08-01 DIAGNOSIS — E559 Vitamin D deficiency, unspecified: Secondary | ICD-10-CM | POA: Diagnosis not present

## 2015-08-01 DIAGNOSIS — X58XXXA Exposure to other specified factors, initial encounter: Secondary | ICD-10-CM | POA: Diagnosis not present

## 2015-08-01 DIAGNOSIS — Z79899 Other long term (current) drug therapy: Secondary | ICD-10-CM | POA: Diagnosis not present

## 2015-08-01 DIAGNOSIS — Z8249 Family history of ischemic heart disease and other diseases of the circulatory system: Secondary | ICD-10-CM | POA: Diagnosis not present

## 2015-08-01 DIAGNOSIS — I10 Essential (primary) hypertension: Secondary | ICD-10-CM | POA: Diagnosis not present

## 2015-08-01 DIAGNOSIS — M1711 Unilateral primary osteoarthritis, right knee: Secondary | ICD-10-CM | POA: Diagnosis not present

## 2015-08-01 DIAGNOSIS — Z811 Family history of alcohol abuse and dependence: Secondary | ICD-10-CM | POA: Diagnosis not present

## 2015-08-01 DIAGNOSIS — Y939 Activity, unspecified: Secondary | ICD-10-CM | POA: Diagnosis not present

## 2015-08-01 DIAGNOSIS — E1165 Type 2 diabetes mellitus with hyperglycemia: Secondary | ICD-10-CM | POA: Diagnosis not present

## 2015-08-01 DIAGNOSIS — Z6841 Body Mass Index (BMI) 40.0 and over, adult: Secondary | ICD-10-CM | POA: Diagnosis not present

## 2015-08-01 DIAGNOSIS — Z888 Allergy status to other drugs, medicaments and biological substances status: Secondary | ICD-10-CM | POA: Diagnosis not present

## 2015-08-01 DIAGNOSIS — D649 Anemia, unspecified: Secondary | ICD-10-CM | POA: Diagnosis not present

## 2015-08-01 DIAGNOSIS — S83241A Other tear of medial meniscus, current injury, right knee, initial encounter: Secondary | ICD-10-CM | POA: Diagnosis present

## 2015-08-01 DIAGNOSIS — Z885 Allergy status to narcotic agent status: Secondary | ICD-10-CM | POA: Diagnosis not present

## 2015-08-01 DIAGNOSIS — Z800 Family history of malignant neoplasm of digestive organs: Secondary | ICD-10-CM | POA: Diagnosis not present

## 2015-08-01 DIAGNOSIS — K5790 Diverticulosis of intestine, part unspecified, without perforation or abscess without bleeding: Secondary | ICD-10-CM | POA: Diagnosis not present

## 2015-08-01 LAB — BASIC METABOLIC PANEL
Anion gap: 7 (ref 5–15)
BUN: 22 mg/dL — ABNORMAL HIGH (ref 6–20)
CO2: 28 mmol/L (ref 22–32)
Calcium: 9 mg/dL (ref 8.9–10.3)
Chloride: 106 mmol/L (ref 101–111)
Creatinine, Ser: 1.03 mg/dL — ABNORMAL HIGH (ref 0.44–1.00)
GFR calc Af Amer: >60 mL/min (ref >60)
GFR calc non Af Amer: 55 mL/min — ABNORMAL LOW (ref >60)
Glucose, Bld: 165 mg/dL — ABNORMAL HIGH (ref 65–99)
Potassium: 4.1 mmol/L (ref 3.5–5.1)
Sodium: 141 mmol/L (ref 135–145)

## 2015-08-01 NOTE — Pre-Procedure Instructions (Signed)
CLEARANCE NOTE RECEIVED FROM HOPE AT DR [facility] OFFICE-PT LOW RISK PER DR Ramonita Lab

## 2015-08-01 NOTE — Pre-Procedure Instructions (Signed)
SPOKE WITH DR [facility] VIA PHONE ABOUT ABNORMAL EKG-DR P WANTS PT TO GET MEDICAL CLEARANCE- CALLED [facility]OPE AT DR [facility] OFFICE AND NOTIFIED HER OF THIS- I TOLD HER THAT SHE MAY BE ABLE TO GET PT IN QUICKER THAN ME-HOPE TOLD ME TO FAX OVER EKG AND CLEARANCE NOTE OVER TO HER-RECEIVED FAX CONFIRMATION THAT IT WENT THRU

## 2015-08-01 NOTE — Patient Instructions (Signed)
  Your procedure is scheduled on: 08-02-15 (THURSDAY) Report to [place] To find out your arrival time please call [phone] between 1PM - 3PM on 08-01-15 [facility])  Remember: Instructions that are not followed completely may result in serious medical risk, up to and including death, or upon the discretion of your surgeon and anesthesiologist your surgery may need to be rescheduled.    _X___ 1. Do not eat food or drink liquids after midnight. No gum chewing or hard candies.     _X___ 2. No Alcohol for 24 hours before or after surgery.   ____ 3. Bring all medications with you on the day of surgery if instructed.    _X___ 4. Notify your doctor if there is any change in your medical condition     (cold, fever, infections).     Do not wear jewelry, make-up, hairpins, clips or nail polish.  Do not wear lotions, powders, or perfumes. You may wear deodorant.  Do not shave 48 hours prior to surgery. Men may shave face and neck.  Do not bring valuables to the hospital.    [facility] is not responsible for any belongings or valuables.               Contacts, dentures or bridgework may not be worn into surgery.  Leave your suitcase in the car. After surgery it may be brought to your room.  For patients admitted to the hospital, discharge time is determined by your  treatment team.   Patients discharged the day of surgery will not be allowed to drive home.   Please read over the following fact sheets that you were given:      _X___ Take these medicines the morning of surgery with A SIP OF WATER:    1. BISOPROLOL  2. LISINOPRIL  3. LEVOTHYROXINE  4. GABAPENTIN  5. PRILOSEC  6. TAKE AN EXTRA PRILOSEC TONIGHT  ____ Fleet Enema (as directed)   _X___ Use CHG Soap as directed  ____ Use inhalers on the day of surgery  ____ Stop metformin 2 days prior to surgery    ____ Take 1/2 of usual insulin dose the night before surgery and none on the morning of  surgery.   ____ Stop Coumadin/Plavix/aspirin-N/A  _X___ Stop Anti-inflammatories-STOP ADVIL NOW-NO NSAIDS OR ASPIRIN PRODUCTS-HYDROCODONE OK TO CONTINUE   _X___ Stop supplements until after surgery-STOP BLACK COHOSH NOW   ____ Bring C-Pap to the hospital.

## 2015-08-02 ENCOUNTER — Ambulatory Visit: Payer: Managed Care, Other (non HMO) | Admitting: Anesthesiology

## 2015-08-02 ENCOUNTER — Encounter
Admission: RE | Disposition: A | Discharge status: Home / Self Care | Payer: Self-pay | Source: Ambulatory Visit | Attending: Orthopedic Surgery

## 2015-08-02 ENCOUNTER — Ambulatory Visit
Admission: RE | Admit: 2015-08-02 | Discharge: 2015-08-02 | Disposition: A | Discharge status: Home / Self Care | Payer: Managed Care, Other (non HMO) | Source: Ambulatory Visit | Attending: Orthopedic Surgery | Admitting: Orthopedic Surgery

## 2015-08-02 ENCOUNTER — Encounter: Payer: Self-pay | Admitting: *Deleted

## 2015-08-02 ENCOUNTER — Inpatient Hospital Stay: Admission: RE | Admit: 2015-08-02 | Payer: Managed Care, Other (non HMO) | Source: Ambulatory Visit

## 2015-08-02 DIAGNOSIS — E7800 Pure hypercholesterolemia, unspecified: Secondary | ICD-10-CM | POA: Insufficient documentation

## 2015-08-02 DIAGNOSIS — Z800 Family history of malignant neoplasm of digestive organs: Secondary | ICD-10-CM | POA: Insufficient documentation

## 2015-08-02 DIAGNOSIS — Z885 Allergy status to narcotic agent status: Secondary | ICD-10-CM | POA: Insufficient documentation

## 2015-08-02 DIAGNOSIS — E785 Hyperlipidemia, unspecified: Secondary | ICD-10-CM | POA: Insufficient documentation

## 2015-08-02 DIAGNOSIS — Z888 Allergy status to other drugs, medicaments and biological substances status: Secondary | ICD-10-CM | POA: Insufficient documentation

## 2015-08-02 DIAGNOSIS — S83231A Complex tear of medial meniscus, current injury, right knee, initial encounter: Secondary | ICD-10-CM | POA: Insufficient documentation

## 2015-08-02 DIAGNOSIS — Z90710 Acquired absence of both cervix and uterus: Secondary | ICD-10-CM | POA: Insufficient documentation

## 2015-08-02 DIAGNOSIS — N6019 Diffuse cystic mastopathy of unspecified breast: Secondary | ICD-10-CM | POA: Insufficient documentation

## 2015-08-02 DIAGNOSIS — X58XXXA Exposure to other specified factors, initial encounter: Secondary | ICD-10-CM | POA: Insufficient documentation

## 2015-08-02 DIAGNOSIS — M1711 Unilateral primary osteoarthritis, right knee: Secondary | ICD-10-CM | POA: Insufficient documentation

## 2015-08-02 DIAGNOSIS — E039 Hypothyroidism, unspecified: Secondary | ICD-10-CM | POA: Insufficient documentation

## 2015-08-02 DIAGNOSIS — M6751 Plica syndrome, right knee: Secondary | ICD-10-CM | POA: Insufficient documentation

## 2015-08-02 DIAGNOSIS — Z811 Family history of alcohol abuse and dependence: Secondary | ICD-10-CM | POA: Insufficient documentation

## 2015-08-02 DIAGNOSIS — I10 Essential (primary) hypertension: Secondary | ICD-10-CM | POA: Insufficient documentation

## 2015-08-02 DIAGNOSIS — E559 Vitamin D deficiency, unspecified: Secondary | ICD-10-CM | POA: Insufficient documentation

## 2015-08-02 DIAGNOSIS — Z79899 Other long term (current) drug therapy: Secondary | ICD-10-CM | POA: Insufficient documentation

## 2015-08-02 DIAGNOSIS — Z86010 Personal history of colonic polyps: Secondary | ICD-10-CM | POA: Insufficient documentation

## 2015-08-02 DIAGNOSIS — Y939 Activity, unspecified: Secondary | ICD-10-CM | POA: Insufficient documentation

## 2015-08-02 DIAGNOSIS — E1165 Type 2 diabetes mellitus with hyperglycemia: Secondary | ICD-10-CM | POA: Insufficient documentation

## 2015-08-02 DIAGNOSIS — Z8249 Family history of ischemic heart disease and other diseases of the circulatory system: Secondary | ICD-10-CM | POA: Insufficient documentation

## 2015-08-02 DIAGNOSIS — S83241A Other tear of medial meniscus, current injury, right knee, initial encounter: Secondary | ICD-10-CM | POA: Insufficient documentation

## 2015-08-02 DIAGNOSIS — K5790 Diverticulosis of intestine, part unspecified, without perforation or abscess without bleeding: Secondary | ICD-10-CM | POA: Insufficient documentation

## 2015-08-02 DIAGNOSIS — Z6841 Body Mass Index (BMI) 40.0 and over, adult: Secondary | ICD-10-CM | POA: Insufficient documentation

## 2015-08-02 DIAGNOSIS — G4730 Sleep apnea, unspecified: Secondary | ICD-10-CM | POA: Insufficient documentation

## 2015-08-02 DIAGNOSIS — E669 Obesity, unspecified: Secondary | ICD-10-CM | POA: Insufficient documentation

## 2015-08-02 DIAGNOSIS — Z9049 Acquired absence of other specified parts of digestive tract: Secondary | ICD-10-CM | POA: Insufficient documentation

## 2015-08-02 DIAGNOSIS — D649 Anemia, unspecified: Secondary | ICD-10-CM | POA: Insufficient documentation

## 2015-08-02 HISTORY — PX: KNEE ARTHROSCOPY: SHX127

## 2015-08-02 HISTORY — DX: Anemia, unspecified: D64.9

## 2015-08-02 SURGERY — ARTHROSCOPY, KNEE
Anesthesia: General | Site: Knee | Laterality: Right | Wound class: Clean

## 2015-08-02 MED ORDER — METOCLOPRAMIDE HCL 10 MG PO TABS: 5.0000 mg | ORAL_TABLET | Freq: Three times a day (TID) | ORAL | Status: DC | PRN

## 2015-08-02 MED ORDER — FENTANYL CITRATE (PF) 100 MCG/2ML IJ SOLN
INTRAMUSCULAR | Status: DC | PRN
  Administered 2015-08-02: 100 ug via INTRAVENOUS

## 2015-08-02 MED ORDER — BUPIVACAINE-EPINEPHRINE (PF) 0.5% -1:200000 IJ SOLN
INTRAMUSCULAR | Status: AC
  Filled 2015-08-02: qty 30

## 2015-08-02 MED ORDER — HYDROCODONE-ACETAMINOPHEN 5-325 MG PO TABS
1.0000 | ORAL_TABLET | ORAL | Status: DC | PRN
  Administered 2015-08-02: 1 via ORAL

## 2015-08-02 MED ORDER — SODIUM CHLORIDE 0.9 % IV SOLN: INTRAVENOUS | Status: DC

## 2015-08-02 MED ORDER — METOCLOPRAMIDE HCL 5 MG/ML IJ SOLN: 5.0000 mg | Freq: Three times a day (TID) | INTRAMUSCULAR | Status: DC | PRN

## 2015-08-02 MED ORDER — MIDAZOLAM HCL 2 MG/2ML IJ SOLN
INTRAMUSCULAR | Status: DC | PRN
Start: 2015-08-02 — End: 2015-08-02
  Administered 2015-08-02: 2 mg via INTRAVENOUS

## 2015-08-02 MED ORDER — ONDANSETRON HCL 4 MG/2ML IJ SOLN: 4.0000 mg | Freq: Once | INTRAMUSCULAR | Status: DC | PRN

## 2015-08-02 MED ORDER — FENTANYL CITRATE (PF) 100 MCG/2ML IJ SOLN
25.0000 ug | INTRAMUSCULAR | Status: DC | PRN
  Administered 2015-08-02 (×4): 25 ug via INTRAVENOUS

## 2015-08-02 MED ORDER — PROPOFOL 10 MG/ML IV BOLUS
INTRAVENOUS | Status: DC | PRN
  Administered 2015-08-02: 50 mg via INTRAVENOUS
  Administered 2015-08-02: 150 mg via INTRAVENOUS

## 2015-08-02 MED ORDER — ONDANSETRON HCL 4 MG PO TABS: 4.0000 mg | ORAL_TABLET | Freq: Four times a day (QID) | ORAL | Status: DC | PRN

## 2015-08-02 MED ORDER — ONDANSETRON HCL 4 MG/2ML IJ SOLN: 4.0000 mg | Freq: Four times a day (QID) | INTRAMUSCULAR | Status: DC | PRN

## 2015-08-02 MED ORDER — HYDROCODONE-ACETAMINOPHEN 5-325 MG PO TABS: 1.0000 | ORAL_TABLET | Freq: Four times a day (QID) | ORAL | Status: DC | PRN

## 2015-08-02 MED ORDER — LIDOCAINE HCL (CARDIAC) 20 MG/ML IV SOLN
INTRAVENOUS | Status: DC | PRN
  Administered 2015-08-02: 100 mg via INTRAVENOUS

## 2015-08-02 MED ORDER — ONDANSETRON HCL 4 MG/2ML IJ SOLN
INTRAMUSCULAR | Status: DC | PRN
  Administered 2015-08-02: 4 mg via INTRAVENOUS

## 2015-08-02 MED ORDER — LACTATED RINGERS IV SOLN
INTRAVENOUS | Status: DC
  Administered 2015-08-02: 10:00:00 via INTRAVENOUS

## 2015-08-02 MED ORDER — HYDROCODONE-ACETAMINOPHEN 5-325 MG PO TABS
ORAL_TABLET | ORAL | Status: AC
  Filled 2015-08-02: qty 1

## 2015-08-02 MED ORDER — FENTANYL CITRATE (PF) 100 MCG/2ML IJ SOLN
INTRAMUSCULAR | Status: AC
  Administered 2015-08-02: 25 ug via INTRAVENOUS
  Filled 2015-08-02: qty 2

## 2015-08-02 SURGICAL SUPPLY — 28 items
BANDAGE ACE 4X5 VEL STRL LF (GAUZE/BANDAGES/DRESSINGS) ×2 IMPLANT
BANDAGE ELASTIC 4 LF NS (GAUZE/BANDAGES/DRESSINGS) ×2 IMPLANT
BLADE FULL RADIUS 3.5 (BLADE) IMPLANT
BLADE INCISOR PLUS 4.5 (BLADE) ×2 IMPLANT
BLADE SHAVER 4.5 DBL SERAT CV (CUTTER) IMPLANT
BLADE SHAVER 4.5X7 STR FR (MISCELLANEOUS) ×2 IMPLANT
CHLORAPREP W/TINT 26ML (MISCELLANEOUS) ×2 IMPLANT
CUTTER AGGRESSIVE+ 3.5 (CUTTER) IMPLANT
GAUZE PETRO XEROFOAM 1X8 (MISCELLANEOUS) ×2 IMPLANT
GAUZE SPONGE 4X4 12PLY STRL (GAUZE/BANDAGES/DRESSINGS) ×2 IMPLANT
GLOVE BIOGEL PI IND STRL 9 (GLOVE) ×1 IMPLANT
GLOVE BIOGEL PI INDICATOR 9 (GLOVE) ×1
GLOVE SURG ORTHO 9.0 STRL STRW (GLOVE) ×2 IMPLANT
GOWN SPECIALTY ULTRA XL (MISCELLANEOUS) ×2 IMPLANT
GOWN STRL REUS W/ TWL LRG LVL3 (GOWN DISPOSABLE) ×1 IMPLANT
GOWN STRL REUS W/TWL LRG LVL3 (GOWN DISPOSABLE) ×1
IV LACTATED RINGER IRRG 3000ML (IV SOLUTION) ×4
IV LR IRRIG 3000ML ARTHROMATIC (IV SOLUTION) ×4 IMPLANT
KIT RM TURNOVER STRD PROC AR (KITS) ×2 IMPLANT
MANIFOLD NEPTUNE II (INSTRUMENTS) ×2 IMPLANT
PACK ARTHROSCOPY KNEE (MISCELLANEOUS) ×2 IMPLANT
SET TUBE SUCT SHAVER OUTFL 24K (TUBING) ×2 IMPLANT
SET TUBE TIP INTRA-ARTICULAR (MISCELLANEOUS) ×2 IMPLANT
SUT ETHILON 4-0 (SUTURE) ×1
SUT ETHILON 4-0 FS2 18XMFL BLK (SUTURE) ×1
SUTURE ETHLN 4-0 FS2 18XMF BLK (SUTURE) ×1 IMPLANT
TUBING ARTHRO INFLOW-ONLY STRL (TUBING) ×2 IMPLANT
WAND HAND CNTRL MULTIVAC 50 (MISCELLANEOUS) ×2 IMPLANT

## 2015-08-02 NOTE — Discharge Instructions (Addendum)
Keep dressing clean and dry. If bandage slides down leg remove entire bandage, covered to incisions with Band-Aids and then reapply Ace wrap. Weightbearing as tolerated on right leg. Keep leg elevated this weekend. Take aspirin 325 mg 1 a day until walking normallyAMBULATORY SURGERY  DISCHARGE INSTRUCTIONS   1) The drugs that you were given will stay in your system until tomorrow so for the next 24 hours you should not:  A) Drive an automobile B) Make any legal decisions C) Drink any alcoholic beverage   2) You may resume regular meals tomorrow.  Today it is better to start with liquids and gradually work up to solid foods.  You may eat anything you prefer, but it is better to start with liquids, then soup and crackers, and gradually work up to solid foods.   3) Please notify your doctor immediately if you have any unusual bleeding, trouble breathing, redness and pain at the surgery site, drainage, fever, or pain not relieved by medication.    4) Additional Instructions:        Please contact your physician with any problems or Same Day Surgery at [phone], Monday through Friday 6 am to 4 pm, or [place] at [facility] number at [phone].

## 2015-08-02 NOTE — Anesthesia Preprocedure Evaluation (Signed)
Anesthesia Evaluation  Patient identified by MRN, date of birth, ID band Patient awake    Reviewed: Allergy & Precautions, NPO status , Patient's Chart, lab work & pertinent test results, reviewed documented beta blocker date and time   Airway Mallampati: III  TM Distance: <3 FB Neck ROM: Limited    Dental  (+) Chipped   Pulmonary sleep apnea ,    Pulmonary exam normal breath sounds clear to auscultation       Cardiovascular hypertension, Pt. on medications and Pt. on home beta blockers Normal cardiovascular exam     Neuro/Psych negative neurological ROS  negative psych ROS   GI/Hepatic Neg liver ROS, GERD  Medicated and Controlled,  Endo/Other  Hypothyroidism   Renal/GU negative Renal ROS  negative genitourinary   Musculoskeletal  (+) Arthritis , Osteoarthritis,    Abdominal Normal abdominal exam  (+) + obese,   Peds negative pediatric ROS (+)  Hematology  (+) anemia ,   Anesthesia Other Findings Large neck...obese  Reproductive/Obstetrics                             Anesthesia Physical Anesthesia Plan  ASA: III  Anesthesia Plan: General   Post-op Pain Management:    Induction: Intravenous  Airway Management Planned: LMA  Additional Equipment:   Intra-op Plan:   Post-operative Plan: Extubation in OR  Informed Consent: I have reviewed the patients History and Physical, chart, labs and discussed the procedure including the risks, benefits and alternatives for the proposed anesthesia with the patient or authorized representative who has indicated his/her understanding and acceptance.   Dental advisory given  Plan Discussed with: Surgeon and CRNA  Anesthesia Plan Comments:         Anesthesia Quick Evaluation

## 2015-08-02 NOTE — H&P (Signed)
Reviewed paper H+P, will be scanned into chart. No changes noted.  

## 2015-08-02 NOTE — Transfer of Care (Signed)
Immediate Anesthesia Transfer of Care Note  Patient: Dana Morton  Procedure(s) Performed: Procedure(s): ARTHROSCOPY KNEE, PARTIAL MEDIAL MENISECTOMY, PLICA INCISION (Right)  Patient Location: PACU  Anesthesia Type:General  Level of Consciousness: sedated  Airway & Oxygen Therapy: Patient Spontanous Breathing and Patient connected to face mask oxygen  Post-op Assessment: Report given to RN and Post -op Vital signs reviewed and stable  Post vital signs: Reviewed and stable  Last Vitals:  Filed Vitals:   08/02/15 0838  BP: 146/72  Pulse: 85  Temp: 36.8 C  Resp: 18    Complications: No apparent anesthesia complications

## 2015-08-02 NOTE — Op Note (Signed)
08/02/2015  10:36 AM  PATIENT:  Dana Morton  65 y.o. female  PRE-OPERATIVE DIAGNOSIS:  Internal derangement right knee, arthritis and medial meniscus tear  POST-OPERATIVE DIAGNOSIS:  Internal derangement right knee, same with plica  PROCEDURE:  Procedure(s): ARTHROSCOPY KNEE, PARTIAL MEDIAL MENISECTOMY, PLICA INCISION (Right)  SURGEON: Laurene Footman, MD  ASSISTANTS: None  ANESTHESIA:   general  EBL:     BLOOD ADMINISTERED:none  DRAINS: none   LOCAL MEDICATIONS USED:  MARCAINE     SPECIMEN:  No Specimen  DISPOSITION OF SPECIMEN:  N/A  COUNTS:  YES  TOURNIQUET:  None   IMPLANTS:  None  DICTATION: .Dragon Dictation patient brought the operating room and after adequate general anesthesia was obtained, the right leg was placed in the arthroscopic leg holder with tourniquet applied but not required and the leg prepped and draped in usual sterile fashion. After patient identification and timeout procedures were completed, an inferior lateral portal was made and the arthroscope was introduced. Initial inspection revealed mild to moderate degenerative changes around the entire patella without fissuring or exposed bone cement findings and the trochlear groove with normal tracking coming medially there is a very thick medial plica that impinged on the patellofemoral joint this was subsequently debrided with use of a shaver. Coming in the medial compartment inferior medial portal was made and a probe introduced with a complex tear consistent with MRI findings with the radial tear in the middle third that extended posteriorly involving the entire posterior third this was debrided with use of a meniscal punch followed by shaver and then ArthriCare wand to remove all loose malignant meniscal tissue resecting approximately two thirds of the posterior and middle thirds. There was mild degenerative changes in the medial compartment again was superficial articular cartilage loss without  fissuring or exposed bone. The anterior cruciate ligament was intact and the lateral compartment was relatively normal with just some fraying of the anterior horn of lateral meniscus gutters were free of any loose bodies after addressing the medial meniscal tear of the plica was debrided with a shaver and no longer impinged on the patellofemoral joint or medial femoral condyle. The knee was irrigated until clear. 30 cc of half percent Sensorcaine were infiltrated in the area the incisions and the wounds were closed simple interrupted 4-0 nylon skin sutures. Xeroform 4 x 4 web roll and Ace wrap applied  PLAN OF CARE: Discharge to home after PACU  PATIENT DISPOSITION:  PACU - hemodynamically stable.

## 2015-08-02 NOTE — Anesthesia Procedure Notes (Signed)
Procedure Name: LMA Insertion Date/Time: 08/02/2015 10:07 AM Performed by: Nelda Marseille Pre-anesthesia Checklist: Patient identified, Patient being monitored, Timeout performed, Emergency Drugs available and Suction available Patient Re-evaluated:Patient Re-evaluated prior to inductionOxygen Delivery Method: Circle system utilized Preoxygenation: Pre-oxygenation with 100% oxygen Intubation Type: IV induction Ventilation: Mask ventilation without difficulty LMA: LMA inserted LMA Size: 3.5 Tube type: Oral Number of attempts: 1 Placement Confirmation: positive ETCO2 and breath sounds checked- equal and bilateral Tube secured with: Tape Dental Injury: Teeth and Oropharynx as per pre-operative assessment

## 2015-08-03 NOTE — Anesthesia Postprocedure Evaluation (Signed)
Anesthesia Post Note  Patient: Dana Morton  Procedure(s) Performed: Procedure(s) (LRB): ARTHROSCOPY KNEE, PARTIAL MEDIAL MENISECTOMY, PLICA INCISION (Right)  Patient location during evaluation: PACU Anesthesia Type: General Level of consciousness: awake and alert and oriented Pain management: pain level controlled Vital Signs Assessment: post-procedure vital signs reviewed and stable Respiratory status: spontaneous breathing Cardiovascular status: blood pressure returned to baseline Anesthetic complications: no    Last Vitals:  Filed Vitals:   08/02/15 1123 08/02/15 1140  BP: 132/64 103/79  Pulse: 65 59  Temp: 36.3 C 36.8 C  Resp: 16 16    Last Pain:  Filed Vitals:   08/03/15 0823  PainSc: 8                  Dana Morton

## 2015-08-12 ENCOUNTER — Ambulatory Visit
Admission: RE | Admit: 2015-08-12 | Discharge: 2015-08-12 | Disposition: A | Discharge status: Home / Self Care | Payer: Managed Care, Other (non HMO) | Source: Ambulatory Visit | Attending: Neurosurgery | Admitting: Neurosurgery

## 2015-08-12 DIAGNOSIS — M5416 Radiculopathy, lumbar region: Secondary | ICD-10-CM

## 2015-09-12 ENCOUNTER — Ambulatory Visit: Payer: Managed Care, Other (non HMO) | Attending: Orthopedic Surgery

## 2015-09-12 DIAGNOSIS — M25561 Pain in right knee: Secondary | ICD-10-CM | POA: Diagnosis present

## 2015-09-12 DIAGNOSIS — R531 Weakness: Secondary | ICD-10-CM | POA: Insufficient documentation

## 2015-09-12 DIAGNOSIS — Z4789 Encounter for other orthopedic aftercare: Secondary | ICD-10-CM

## 2015-09-12 DIAGNOSIS — R262 Difficulty in walking, not elsewhere classified: Secondary | ICD-10-CM | POA: Insufficient documentation

## 2015-09-12 NOTE — Therapy (Addendum)
[place] [facility] [address] [place], [address] Phone: [phone]   Fax:  [phone]  Physical Therapy Evaluation  Patient Details  Name: Dana Morton MRN: [id number] Date of Birth: 26-Aug-1950 Referring Provider: Hessie Knows, MD  Encounter Date: 09/12/2015      PT End of Session - 09/12/15 0910    Visit Number 1   Number of Visits 13   Date for PT Re-Evaluation 10/25/15   Authorization Type 1   Authorization Time Period of 10   PT Start Time 0907   PT Stop Time 1000   PT Time Calculation (min) 53 min   Equipment Utilized During Treatment --  pt rollator walker   Activity Tolerance Patient tolerated treatment well   Behavior During Therapy [facility] for tasks assessed/performed      Past Medical History  Diagnosis Date  . Hypertension   . Herpes simplex   . Fibrocystic disease of breast   . Hyperlipidemia   . Vitamin D deficiency   . Hypothyroidism   . Hypercholesteremia   . GERD (gastroesophageal reflux disease)   . Sleep apnea     no cpap  . Cancer ([place])   . Anemia     h/o     Past Surgical History  Procedure Laterality Date  . Abdominal hysterectomy    . Cholecystectomy    . Dilation and curettage of uterus    . Colonoscopy with propofol N/A 03/22/2015    Procedure: COLONOSCOPY WITH PROPOFOL;  Surgeon: Hulen Luster, MD;  Location: [facility] ENDOSCOPY;  Service: Gastroenterology;  Laterality: N/A;  . Esophagogastroduodenoscopy (egd) with propofol N/A 03/22/2015    Procedure: ESOPHAGOGASTRODUODENOSCOPY (EGD) WITH PROPOFOL;  Surgeon: Hulen Luster, MD;  Location: [facility] ENDOSCOPY;  Service: Gastroenterology;  Laterality: N/A;  . Dg foot heel ([facility] hx)    . Knee arthroscopy Right 08/02/2015    Procedure: ARTHROSCOPY KNEE, PARTIAL MEDIAL MENISECTOMY, PLICA INCISION;  Surgeon: Hessie Knows, MD;  Location: [facility] ORS;  Service: Orthopedics;  Laterality: Right;    There were no vitals filed for this  visit.  Visit Diagnosis:  Orthopedic aftercare - Plan: PT plan of care cert/re-cert  Right knee pain - Plan: PT plan of care cert/re-cert  Weakness - Plan: PT plan of care cert/re-cert  Difficulty walking - Plan: PT plan of care cert/re-cert      Subjective Assessment - 09/12/15 0915    Subjective R knee pain: 0/10 currently (pt sitting); 10/10 at worst (towards the evening when up and around). 7/10 R knee pain when she stands up from sitting (took 2 hydrocodone an hour ago).    Pertinent History S/P R medial menisectomy 08/02/2015 secondary to severe knee pain which got to the point that she could not walk. Currently has knee pain with walking. Pt however had one day when she walked without knee pain (3 weeks post op). Still has knee pain and feels like her tendon is going to snap when she stands up and walks.    Patient Stated Goals To be able to walk again normally   Currently in Pain? Yes   Pain Score 7   with sit <> stand   Pain Location Knee   Pain Orientation Right   Pain Descriptors / Indicators Aching   Aggravating Factors  standing up from sitting. Walking (sometimes knee hurts, sometimes, it does not)   Pain Relieving Factors sitting position, heat before exercise, ice pack   Multiple Pain Sites No  Objectives  Manual therapy:  Medial glide to R patella in supine with knee in slight flexion, and in about 90 degree flexion grade 3. Improved mobility.   No R knee pain with sit <> stand afterwards.          [facility] PT Assessment - 09/12/15 0927    Assessment   Medical Diagnosis S/P R knee partial menisectomy, plica incision   Referring Provider Hessie Knows, MD   Onset Date/Surgical Date 08/02/15   Prior Therapy No known physical therapy for current condition   Precautions   Precaution Comments No known precautions   Restrictions   Other Position/Activity Restrictions no known restrictions   Balance Screen   Has the patient fallen in the past 6 months No    Has the patient had a decrease in activity level because of a fear of falling?  No  Pt does state fear of falling.    Is the patient reluctant to leave their home because of a fear of falling?  No  Pt does state fear of falling.    Prior Function   Vocation Retired   Biomedical scientist PLOF: better able to stand up from a chair, walk   Observation/Other Assessments   Observations Surgical incision healed satisfactorily   Posture/Postural Control   Posture Comments Bilateral foot pronation, slight R trunk rotation, bilaterally protracted shoulders and neck, slight L weight shift.   AROM   Right Knee Extension -13  seated; -8 degrees supine PROM   Right Knee Flexion 108  supine; 114 supine PROM   Left Knee Extension 0  seated   Left Knee Flexion 120  supine   Strength   Right Hip ABduction 4/5   Left Hip ABduction 4/5   Right Knee Flexion 4+/5   Right Knee Extension 4/5   Left Knee Flexion 4/5   Left Knee Extension 4+/5   Palpation   Palpation comment TTP R knee (medial and lateral). Decreased medial, superior, and inferior mobility R patella. Some swelling   Ambulation/Gait   Gait Comments uses rollator, antalgic, slight decrease stance R LE; side weight shifting.                            PT Education - 09/12/15 2037    Education provided Yes   Education Details plan of care   Person(s) Educated Patient   Methods Explanation   Comprehension Verbalized understanding             PT Long Term Goals - 09/12/15 2041    PT LONG TERM GOAL #1   Title Patient will have a decrease in R knee pain to 5/10 or less at worst to promote ability to stand up from a chair as well as to improve ability to ambulate.    Baseline 10/10 at worst   Time 6   Period Weeks   Status New   PT LONG TERM GOAL #2   Title Patient will improve bilateral hip strength and R knee strength by at least 1/2 MMT grade to promote ability to stand up from a chair as well as to  improve ability to ambulate.    Time 6   Period Weeks   Status New   PT LONG TERM GOAL #3   Title Patient will be able to ambulate at least 500 ft without AD and no LOB to promote independence with ambulation and mobility.   Baseline pt currently ambulates with a rollator  walker   Time 6   Period Weeks   Status New   PT LONG TERM GOAL #4   Title Patient will improve R knee extension AROM to 0 degrees to promote knee extension during stance phase of gait.    Baseline -13 degrees knee extension AROM   Time 6   Period Weeks   Status New   PT LONG TERM GOAL #5   Title Patient will improve R knee flexion AROM to 115 degrees to promote ability to stand up from a chair.    Baseline 108 degrees R knee flexion AROM   Time 6   Period Weeks   Status New               Plan - 2015/09/16 1005    Clinical Impression Statement Patient is a 65 year old female who came to physical therapy S/P R parital menisectomy, plica incision on [id number]. She also presents with TTP medial and lateral R knee, decreased medial, superior, inferior glide R patella, limited R knee flexion and extension AROM, hip and knee weakness, altered gait pattern and posture, and difficulty performing functional tasks such as walking and standing up from the seated position. Patient will benefit from skilled physical therapy intervention to address the aforementioned deficits.    Pt will benefit from skilled therapeutic intervention in order to improve on the following deficits Pain;Decreased range of motion;Postural dysfunction;Difficulty walking;Decreased strength;Abnormal gait   Rehab Potential Good   Clinical Impairments Affecting Rehab Potential pain   PT Frequency 2x / week   PT Duration 6 weeks   PT Treatment/Interventions Manual techniques;Therapeutic activities;Therapeutic exercise;Electrical Stimulation;Aquatic Therapy;Gait training;Neuromuscular re-education;Patient/family education;Ultrasound   PT Next Visit Plan  patellar mobility, hip strengthening, R LE strengthening, gait   Consulted and Agree with Plan of Care Patient          G-Codes - 09/16/15 10/18/2047    Functional Assessment Tool Used Patient interview, clinical presentation   Functional Limitation Mobility: Walking and moving around   Mobility: Walking and Moving Around Current Status [phone]) At least 60 percent but less than 80 percent impaired, limited or restricted   Mobility: Walking and Moving Around Goal Status [phone]) At least 20 percent but less than 40 percent impaired, limited or restricted       Problem List There are no active problems to display for this patient.  Thank you for your referral.  Joneen Boers PT, DPT    Sep 16, 2015, 8:56 PM  [place] [facility] [address] [place], [address] Phone: [phone]   Fax:  [phone]  Name: Dana Morton MRN: [id number] Date of Birth: 08-02-1950

## 2015-09-17 ENCOUNTER — Ambulatory Visit: Payer: Managed Care, Other (non HMO)

## 2015-09-17 DIAGNOSIS — M25561 Pain in right knee: Secondary | ICD-10-CM

## 2015-09-17 DIAGNOSIS — Z4789 Encounter for other orthopedic aftercare: Secondary | ICD-10-CM | POA: Diagnosis not present

## 2015-09-17 DIAGNOSIS — R531 Weakness: Secondary | ICD-10-CM

## 2015-09-17 DIAGNOSIS — R262 Difficulty in walking, not elsewhere classified: Secondary | ICD-10-CM

## 2015-09-17 NOTE — Therapy (Addendum)
[place] [facility] [address], [place], [address] Phone: [phone]   Fax:  [phone]  Physical Therapy Treatment  Patient Details  Name: Dana Morton MRN: [id number] Date of Birth: May 30, 1951 Referring Provider: Hessie Knows, MD  Encounter Date: 09/17/2015      PT End of Session - 09/17/15 1259    Visit Number 2   Number of Visits 13   Date for PT Re-Evaluation 10/25/15   Authorization Type 2   Authorization Time Period of 10   PT Start Time 1259   PT Stop Time 1343   PT Time Calculation (min) 44 min   Equipment Utilized During Treatment --  pt rollator walker   Activity Tolerance Patient tolerated treatment well   Behavior During Therapy [facility] for tasks assessed/performed      Past Medical History  Diagnosis Date  . Hypertension   . Herpes simplex   . Fibrocystic disease of breast   . Hyperlipidemia   . Vitamin D deficiency   . Hypothyroidism   . Hypercholesteremia   . GERD (gastroesophageal reflux disease)   . Sleep apnea     no cpap  . Cancer ([place])   . Anemia     h/o     Past Surgical History  Procedure Laterality Date  . Abdominal hysterectomy    . Cholecystectomy    . Dilation and curettage of uterus    . Colonoscopy with propofol N/A 03/22/2015    Procedure: COLONOSCOPY WITH PROPOFOL;  Surgeon: Hulen Luster, MD;  Location: [facility] ENDOSCOPY;  Service: Gastroenterology;  Laterality: N/A;  . Esophagogastroduodenoscopy (egd) with propofol N/A 03/22/2015    Procedure: ESOPHAGOGASTRODUODENOSCOPY (EGD) WITH PROPOFOL;  Surgeon: Hulen Luster, MD;  Location: [facility] ENDOSCOPY;  Service: Gastroenterology;  Laterality: N/A;  . Dg foot heel ([facility] hx)    . Knee arthroscopy Right 08/02/2015    Procedure: ARTHROSCOPY KNEE, PARTIAL MEDIAL MENISECTOMY, PLICA INCISION;  Surgeon: Hessie Knows, MD;  Location: [facility] ORS;  Service: Orthopedics;  Laterality: Right;    There were no vitals filed for this visit.  Visit  Diagnosis:  Orthopedic aftercare  Right knee pain  Weakness  Difficulty walking      Subjective Assessment - 09/17/15 1301    Subjective 9/10 R knee this weekend (pt was walking, went out of town this weekend). Knee pain 0/10 currently.    Pertinent History S/P R medial menisectomy 08/02/2015 secondary to severe knee pain which got to the point that she could not walk. Currently has knee pain with walking. Pt however had one day when she walked without knee pain (3 weeks post op). Still has knee pain and feels like her tendon is going to snap when she stands up and walks.    Patient Stated Goals To be able to walk again normally   Currently in Pain? No/denies   Pain Score 0-No pain   Multiple Pain Sites No        Objectives  Manual therapy:  Medial glide to R patella sitting with knee in flexion and in supine with knee in  and in about 90 degree flexion grade 3. Improved mobility.  Soft tissue mobilization to R hamstring in supine with R leg propped on pillow to promote extension AROM     There-ex  Directed patient with supine quad set 10x3 with 5 second holds with L leg propped on pillow  Supine SLR R hip flexion 10x2  S/L R hip abduction 5x3  Seated R knee extension 10x2 with 5 second holds  Sit <> stand with hip adductor ball squeeze from elevated mat table 5x   Improved exercise technique, movement at target joints, use of target muscles after mod verbal, visual, tactile cues.     Pt tolerated session well without aggravation of knee pain. Good glute med and quadriceps muscle use felt by pt during exercises.           PT Education - 09/17/15 1337    Education provided Yes   Education Details ther-ex, HEP   Person(s) Educated Patient   Methods Explanation;Demonstration;Tactile cues;Verbal cues   Comprehension Verbalized understanding;Returned demonstration             PT Long Term Goals - 09/12/15 2041    PT LONG TERM GOAL #1   Title Patient  will have a decrease in R knee pain to 5/10 or less at worst to promote ability to stand up from a chair as well as to improve ability to ambulate.    Baseline 10/10 at worst   Time 6   Period Weeks   Status New   PT LONG TERM GOAL #2   Title Patient will improve bilateral hip strength and R knee strength by at least 1/2 MMT grade to promote ability to stand up from a chair as well as to improve ability to ambulate.    Time 6   Period Weeks   Status New   PT LONG TERM GOAL #3   Title Patient will be able to ambulate at least 500 ft without AD and no LOB to promote independence with ambulation and mobility.   Baseline pt currently ambulates with a rollator walker   Time 6   Period Weeks   Status New   PT LONG TERM GOAL #4   Title Patient will improve R knee extension AROM to 0 degrees to promote knee extension during stance phase of gait.    Baseline -13 degrees knee extension AROM   Time 6   Period Weeks   Status New   PT LONG TERM GOAL #5   Title Patient will improve R knee flexion AROM to 115 degrees to promote ability to stand up from a chair.    Baseline 108 degrees R knee flexion AROM   Time 6   Period Weeks   Status New               Plan - 09/17/15 1337    Clinical Impression Statement Pt tolerated session well without aggravation of knee pain. Good glute med and quadriceps muscle use felt by pt during exercises.    Pt will benefit from skilled therapeutic intervention in order to improve on the following deficits Pain;Decreased range of motion;Postural dysfunction;Difficulty walking;Decreased strength;Abnormal gait   Rehab Potential Good   Clinical Impairments Affecting Rehab Potential pain   PT Frequency 2x / week   PT Duration 6 weeks   PT Treatment/Interventions Manual techniques;Therapeutic activities;Therapeutic exercise;Electrical Stimulation;Aquatic Therapy;Gait training;Neuromuscular re-education;Patient/family education;Ultrasound   PT Next Visit Plan  patellar mobility, hip strengthening, R LE strengthening, gait   Consulted and Agree with Plan of Care Patient        Problem List There are no active problems to display for this patient.   Joneen Boers PT, DPT   09/17/2015, 8:38 PM  [place] [facility] [address] [place], [address] Phone: [phone]   Fax:  [phone]  Name: MODESTY BELGRAVE MRN:  [id number] Date of Birth: [birth year]

## 2015-09-17 NOTE — Patient Instructions (Signed)
Quad Set    With other leg bent, foot flat, slowly tighten muscles on thigh of straight leg (leg propped on pillow) while counting out loud to _5___.  Repeat _10___ times. Do _3___ sessions per day.  http://gt2.exer.us/276   Copyright  VHI. All rights reserved.    Clam Shell 45 Degrees    Lying with hips and knees bent 45, one pillow between knees and ankles. Lift knee. Be sure pelvis does not roll backward. Do not arch back. Do _10__ times, each leg, _3__ times per day.  http://ss.exer.us/75   Copyright  VHI. All rights reserved.

## 2015-09-20 ENCOUNTER — Ambulatory Visit: Payer: Managed Care, Other (non HMO)

## 2015-09-20 DIAGNOSIS — M25561 Pain in right knee: Secondary | ICD-10-CM

## 2015-09-20 DIAGNOSIS — R262 Difficulty in walking, not elsewhere classified: Secondary | ICD-10-CM

## 2015-09-20 DIAGNOSIS — Z4789 Encounter for other orthopedic aftercare: Secondary | ICD-10-CM

## 2015-09-20 DIAGNOSIS — R531 Weakness: Secondary | ICD-10-CM

## 2015-09-20 NOTE — Therapy (Signed)
[place] [facility] [address] [place], [address] Phone: [phone]   Fax:  [phone]  Physical Therapy Treatment  Patient Details  Name: Dana Morton MRN: [id number] Date of Birth: [birth year] Referring Provider: Hessie Knows, MD  Encounter Date: 09/20/2015      PT End of Session - 09/20/15 0948    Visit Number 3   Number of Visits 13   Date for PT Re-Evaluation 10/25/15   Authorization Type 3   Authorization Time Period of 10   PT Start Time 0948   PT Stop Time 1032   PT Time Calculation (min) 44 min   Equipment Utilized During Treatment --  pt rollator walker   Activity Tolerance Patient tolerated treatment well   Behavior During Therapy [facility] for tasks assessed/performed      Past Medical History  Diagnosis Date  . Hypertension   . Herpes simplex   . Fibrocystic disease of breast   . Hyperlipidemia   . Vitamin D deficiency   . Hypothyroidism   . Hypercholesteremia   . GERD (gastroesophageal reflux disease)   . Sleep apnea     no cpap  . Cancer ([place])   . Anemia     h/o     Past Surgical History  Procedure Laterality Date  . Abdominal hysterectomy    . Cholecystectomy    . Dilation and curettage of uterus    . Colonoscopy with propofol N/A 03/22/2015    Procedure: COLONOSCOPY WITH PROPOFOL;  Surgeon: Hulen Luster, MD;  Location: [facility] ENDOSCOPY;  Service: Gastroenterology;  Laterality: N/A;  . Esophagogastroduodenoscopy (egd) with propofol N/A 03/22/2015    Procedure: ESOPHAGOGASTRODUODENOSCOPY (EGD) WITH PROPOFOL;  Surgeon: Hulen Luster, MD;  Location: [facility] ENDOSCOPY;  Service: Gastroenterology;  Laterality: N/A;  . Dg foot heel ([facility] hx)    . Knee arthroscopy Right 08/02/2015    Procedure: ARTHROSCOPY KNEE, PARTIAL MEDIAL MENISECTOMY, PLICA INCISION;  Surgeon: Hessie Knows, MD;  Location: [facility] ORS;  Service: Orthopedics;  Laterality: Right;    There were no vitals filed for this visit.  Visit  Diagnosis:  Orthopedic aftercare  Right knee pain  Weakness  Difficulty walking      Subjective Assessment - 09/20/15 0950    Subjective Pt states having pain bilateral lower pelvic area (anterior hip bilaterally, and R lateral hip) since Monday. Standing up and sitting down from the standing position causes a pulling sensation. Symptoms the same since Monday.  R knee bothered her yesterday trying to get out of the car. Pt felt too weak  to get out of the car and was scary for her.  8/10 R knee pain yesterday. 3/10 R knee pain when walking from waiting room to treatment table.    Pertinent History S/P R medial menisectomy 08/02/2015 secondary to severe knee pain which got to the point that she could not walk. Currently has knee pain with walking. Pt however had one day when she walked without knee pain (3 weeks post op). Still has knee pain and feels like her tendon is going to snap when she stands up and walks.    Patient Stated Goals To be able to walk again normally   Currently in Pain? Yes   Pain Score 3    Multiple Pain Sites No          Objectives  Manual therapy:  Medial glide grade 3 to R patella in sitting with knee in flexion.  Soft tissue  mobilization to R hamstring in supine with R leg propped on pillow to promote extension AROM    There-ex  Directed patient with sit <> stand from mat table 1x (no hip pain)   supine quad set 10x3 with 5 second holds with L leg propped on pillow  S/L R hip abduction 5x  S/L clam shells 10x3   Seated R knee extension 10x2 with 5 second holds   Sit <> stand from elevated mat table (23 inches) 10x2  Seated manually resisted R hip extension (hands at thigh to promote strength to get up from a lower surface such as a car seat) 10x   Improved exercise technique, movement at target joints, use of target muscles after mod verbal, visual, tactile cues.    Tolerated session without complain of knee pain. No hip or knee pain  after session when standing up from the seated position or walking.                              PT Education - 09/20/15 1022    Education provided Yes   Education Details ther-ex   [employer]) Educated Patient   Methods Explanation;Demonstration;Tactile cues;Verbal cues   Comprehension Verbalized understanding;Returned demonstration             PT Long Term Goals - 09/12/15 2041    PT LONG TERM GOAL #1   Title Patient will have a decrease in R knee pain to 5/10 or less at worst to promote ability to stand up from a chair as well as to improve ability to ambulate.    Baseline 10/10 at worst   Time 6   Period Weeks   Status New   PT LONG TERM GOAL #2   Title Patient will improve bilateral hip strength and R knee strength by at least 1/2 MMT grade to promote ability to stand up from a chair as well as to improve ability to ambulate.    Time 6   Period Weeks   Status New   PT LONG TERM GOAL #3   Title Patient will be able to ambulate at least 500 ft without AD and no LOB to promote independence with ambulation and mobility.   Baseline pt currently ambulates with a rollator walker   Time 6   Period Weeks   Status New   PT LONG TERM GOAL #4   Title Patient will improve R knee extension AROM to 0 degrees to promote knee extension during stance phase of gait.    Baseline -13 degrees knee extension AROM   Time 6   Period Weeks   Status New   PT LONG TERM GOAL #5   Title Patient will improve R knee flexion AROM to 115 degrees to promote ability to stand up from a chair.    Baseline 108 degrees R knee flexion AROM   Time 6   Period Weeks   Status New               Plan - 09/20/15 1023    Clinical Impression Statement Tolerated session without complain of knee pain. No hip or knee pain after session when standing up from the seated position or walking.    Pt will benefit from skilled therapeutic intervention in order to improve on the following  deficits Pain;Decreased range of motion;Postural dysfunction;Difficulty walking;Decreased strength;Abnormal gait   Rehab Potential Good   Clinical Impairments Affecting Rehab Potential pain   PT  Frequency 2x / week   PT Duration 6 weeks   PT Treatment/Interventions Manual techniques;Therapeutic activities;Therapeutic exercise;Electrical Stimulation;Aquatic Therapy;Gait training;Neuromuscular re-education;Patient/family education;Ultrasound   PT Next Visit Plan patellar mobility, hip strengthening, R LE strengthening, gait   Consulted and Agree with Plan of Care Patient        Problem List There are no active problems to display for this patient.   Joneen Boers PT, DPT   09/20/2015, 8:18 PM  [place] [facility] [address] [place], [address] Phone: [phone]   Fax:  [phone]  Name: Dana Morton MRN: [id number] Date of Birth: 08-Sep-1950

## 2015-09-24 ENCOUNTER — Ambulatory Visit: Payer: Managed Care, Other (non HMO)

## 2015-09-24 DIAGNOSIS — R531 Weakness: Secondary | ICD-10-CM

## 2015-09-24 DIAGNOSIS — R262 Difficulty in walking, not elsewhere classified: Secondary | ICD-10-CM

## 2015-09-24 DIAGNOSIS — M25561 Pain in right knee: Secondary | ICD-10-CM

## 2015-09-24 DIAGNOSIS — Z4789 Encounter for other orthopedic aftercare: Secondary | ICD-10-CM

## 2015-09-24 NOTE — Therapy (Signed)
[place] [facility] [address], [place], [address] Phone: [phone]   Fax:  [phone]  Physical Therapy Treatment  Patient Details  Name: COURTENEY INTERRANTE MRN: [id number] Date of Birth: February 21, 1951 Referring Provider: Hessie Knows, MD  Encounter Date: 09/24/2015      PT End of Session - 09/24/15 0947    Visit Number 4   Number of Visits 13   Date for PT Re-Evaluation 10/25/15   Authorization Type 4   Authorization Time Period of 10   PT Start Time 0947   PT Stop Time 1031   PT Time Calculation (min) 44 min   Equipment Utilized During Treatment --  pt rollator walker   Activity Tolerance Patient tolerated treatment well   Behavior During Therapy [facility] for tasks assessed/performed      Past Medical History  Diagnosis Date  . Hypertension   . Herpes simplex   . Fibrocystic disease of breast   . Hyperlipidemia   . Vitamin D deficiency   . Hypothyroidism   . Hypercholesteremia   . GERD (gastroesophageal reflux disease)   . Sleep apnea     no cpap  . Cancer ([place])   . Anemia     h/o     Past Surgical History  Procedure Laterality Date  . Abdominal hysterectomy    . Cholecystectomy    . Dilation and curettage of uterus    . Colonoscopy with propofol N/A 03/22/2015    Procedure: COLONOSCOPY WITH PROPOFOL;  Surgeon: Hulen Luster, MD;  Location: [facility] ENDOSCOPY;  Service: Gastroenterology;  Laterality: N/A;  . Esophagogastroduodenoscopy (egd) with propofol N/A 03/22/2015    Procedure: ESOPHAGOGASTRODUODENOSCOPY (EGD) WITH PROPOFOL;  Surgeon: Hulen Luster, MD;  Location: [facility] ENDOSCOPY;  Service: Gastroenterology;  Laterality: N/A;  . Dg foot heel ([facility] hx)    . Knee arthroscopy Right 08/02/2015    Procedure: ARTHROSCOPY KNEE, PARTIAL MEDIAL MENISECTOMY, PLICA INCISION;  Surgeon: Hessie Knows, MD;  Location: [facility] ORS;  Service: Orthopedics;  Laterality: Right;    There were no vitals filed for this  visit.  Visit Diagnosis:  Orthopedic aftercare  Right knee pain  Weakness  Difficulty walking      Subjective Assessment - 09/24/15 0949    Subjective R knee is ok today. Did not take pain medication yesterday. Felt a toothache from he knee to her ankle. Took a pain medication yesterday before going to bed. 2/10 when walking from waiting room to treatment table.    Pertinent History S/P R medial menisectomy 08/02/2015 secondary to severe knee pain which got to the point that she could not walk. Currently has knee pain with walking. Pt however had one day when she walked without knee pain (3 weeks post op). Still has knee pain and feels like her tendon is going to snap when she stands up and walks.    Patient Stated Goals To be able to walk again normally   Currently in Pain? Yes   Pain Score 2    Multiple Pain Sites No         Objectives  Manual therapy:  Medial glide grade 3- to 3 to R patella in sitting with knee in flexion and in supine with knee in very slight flexion and propped with pillow and a towel roll  Soft tissue mobilization to R hamstring in supine with R leg propped on pillow to promote extension AROM    There-ex  Directed patient with SLR R  hip flexion 10x2   S/L clam shells 10x3   S/L R hip abduction 5x2  Seated manually resisted R hip extension (hands at thigh to promote strength to get up from a lower surface such as a car seat) 10x2  Seated R knee extension 10x with 5 second holds, then 7x 5 second holds (rest breaks due to fatigue)   Sit <> stand from elevated mat table (25 inches) 10x2   Improved exercise technique, movement at target joints, use of target muscles after mod verbal, visual, tactile cues.       Pt tolerated session well without aggravation of knee pain. No complain of knee pain at end of session with sit <> stand or walking.                             PT Education - 09/24/15 0959    Education  provided Yes   Education Details ther-ex   [employer]) Educated Patient   Methods Explanation;Demonstration;Tactile cues;Verbal cues   Comprehension Returned demonstration;Verbalized understanding             PT Long Term Goals - 09/12/15 2041    PT LONG TERM GOAL #1   Title Patient will have a decrease in R knee pain to 5/10 or less at worst to promote ability to stand up from a chair as well as to improve ability to ambulate.    Baseline 10/10 at worst   Time 6   Period Weeks   Status New   PT LONG TERM GOAL #2   Title Patient will improve bilateral hip strength and R knee strength by at least 1/2 MMT grade to promote ability to stand up from a chair as well as to improve ability to ambulate.    Time 6   Period Weeks   Status New   PT LONG TERM GOAL #3   Title Patient will be able to ambulate at least 500 ft without AD and no LOB to promote independence with ambulation and mobility.   Baseline pt currently ambulates with a rollator walker   Time 6   Period Weeks   Status New   PT LONG TERM GOAL #4   Title Patient will improve R knee extension AROM to 0 degrees to promote knee extension during stance phase of gait.    Baseline -13 degrees knee extension AROM   Time 6   Period Weeks   Status New   PT LONG TERM GOAL #5   Title Patient will improve R knee flexion AROM to 115 degrees to promote ability to stand up from a chair.    Baseline 108 degrees R knee flexion AROM   Time 6   Period Weeks   Status New               Plan - 09/24/15 1002    Clinical Impression Statement  Pt tolerated session well without aggravation of knee pain. No complain of knee pain at end of session with sit <> stand or walking.    Pt will benefit from skilled therapeutic intervention in order to improve on the following deficits Pain;Decreased range of motion;Postural dysfunction;Difficulty walking;Decreased strength;Abnormal gait   Rehab Potential Good   Clinical Impairments Affecting  Rehab Potential pain   PT Frequency 2x / week   PT Duration 6 weeks   PT Treatment/Interventions Manual techniques;Therapeutic activities;Therapeutic exercise;Electrical Stimulation;Aquatic Therapy;Gait training;Neuromuscular re-education;Patient/family education;Ultrasound   PT Next Visit Plan patellar mobility,  hip strengthening, R LE strengthening, gait   Consulted and Agree with Plan of Care Patient        Problem List There are no active problems to display for this patient.   Joneen Boers PT, DPT   09/24/2015, 12:58 PM  [place] [facility] [address] [place], [address] Phone: [phone]   Fax:  [phone]  Name: SHREENA EILAND MRN: [id number] Date of Birth: 06-07-1951

## 2015-09-27 ENCOUNTER — Ambulatory Visit: Payer: Managed Care, Other (non HMO)

## 2015-09-27 DIAGNOSIS — M25561 Pain in right knee: Secondary | ICD-10-CM

## 2015-09-27 DIAGNOSIS — R262 Difficulty in walking, not elsewhere classified: Secondary | ICD-10-CM

## 2015-09-27 DIAGNOSIS — R531 Weakness: Secondary | ICD-10-CM

## 2015-09-27 DIAGNOSIS — Z4789 Encounter for other orthopedic aftercare: Secondary | ICD-10-CM

## 2015-09-27 NOTE — Therapy (Signed)
[place] [facility] [address] [place], [address] Phone: [phone]   Fax:  [phone]  Physical Therapy Treatment And Progress Report  Patient Details  Name: Dana Morton MRN: [id number] Date of Birth: 1950/09/19 Referring Provider: Hessie Knows, MD  Encounter Date: 09/27/2015      PT End of Session - 09/27/15 0953    Visit Number 5   Number of Visits 13   Date for PT Re-Evaluation 10/25/15   Authorization Type 5   Authorization Time Period of 10   PT Start Time 0951   PT Stop Time 1032   PT Time Calculation (min) 41 min   Equipment Utilized During Treatment --  pt rollator walker   Activity Tolerance Patient tolerated treatment well   Behavior During Therapy [facility] for tasks assessed/performed      Past Medical History  Diagnosis Date  . Hypertension   . Herpes simplex   . Fibrocystic disease of breast   . Hyperlipidemia   . Vitamin D deficiency   . Hypothyroidism   . Hypercholesteremia   . GERD (gastroesophageal reflux disease)   . Sleep apnea     no cpap  . Cancer ([place])   . Anemia     h/o     Past Surgical History  Procedure Laterality Date  . Abdominal hysterectomy    . Cholecystectomy    . Dilation and curettage of uterus    . Colonoscopy with propofol N/A 03/22/2015    Procedure: COLONOSCOPY WITH PROPOFOL;  Surgeon: Hulen Luster, MD;  Location: [facility] ENDOSCOPY;  Service: Gastroenterology;  Laterality: N/A;  . Esophagogastroduodenoscopy (egd) with propofol N/A 03/22/2015    Procedure: ESOPHAGOGASTRODUODENOSCOPY (EGD) WITH PROPOFOL;  Surgeon: Hulen Luster, MD;  Location: [facility] ENDOSCOPY;  Service: Gastroenterology;  Laterality: N/A;  . Dg foot heel ([facility] hx)    . Knee arthroscopy Right 08/02/2015    Procedure: ARTHROSCOPY KNEE, PARTIAL MEDIAL MENISECTOMY, PLICA INCISION;  Surgeon: Hessie Knows, MD;  Location: [facility] ORS;  Service: Orthopedics;  Laterality: Right;    There were no vitals filed  for this visit.  Visit Diagnosis:  Orthopedic aftercare  Right knee pain  Weakness  Difficulty walking      Subjective Assessment - 09/27/15 0953    Subjective Pt states her chairs at home are really low and has a hard time standing up from it. R knee is doing pretty pretty good. 1/10 R knee pain when walking from waiting room to treatment table. Next MD appointment is this Monday 10/01/15 at 1:30pm.   Pertinent History S/P R medial menisectomy 08/02/2015 secondary to severe knee pain which got to the point that she could not walk. Currently has knee pain with walking. Pt however had one day when she walked without knee pain (3 weeks post op). Still has knee pain and feels like her tendon is going to snap when she stands up and walks.    Patient Stated Goals To be able to walk again normally   Currently in Pain? Yes   Pain Score 1    Multiple Pain Sites No        Objectives  Manual therapy:  Medial glide grade 3- to 3 to R patella in supine with knee in very slight flexion and propped with pillow   Soft tissue mobilization to R vastus lateralis in sitting with knee in flexion 90 degrees.      There-ex  Directed patient with S/L hip abduction 10x2  each LE  Sit <> stand from low mat table (19 inches) with hip adductor ball squeeze 10x2  Seated manually resisted knee flexion, knee extension, S/L hip abduction 1-2x each LE for each way  Seated AROM R knee flexion and extension  Reviewed progress/current status with LE strength and R knee AROM with pt.    Improved exercise technique, movement at target joints, use of target muscles after mod verbal, visual, tactile cues.      Pt tolerated session well without aggravation of R knee pain or discomfort.  Patient has demonstrated improved R knee flexion and extension AROM, knee extension strength, and overall decreased R knee pain since initial evaluation. She still demonstrates bilateral hip weakness, R knee pain, and  difficulty walking and would benefit from continued skilled physical therapy intervention to address the aforementioned deficits.               [facility] PT Assessment - 09/27/15 0956    AROM   Right Knee Extension -6   Right Knee Flexion 105  improved to 110 degrees after manual therapy    Strength   Right Hip ABduction 4/5   Left Hip ABduction 4/5   Right Knee Flexion 4+/5   Right Knee Extension 5/5   Left Knee Flexion 4/5   Left Knee Extension 5/5                             PT Education - 09/27/15 1013    Education provided Yes   Education Details ther-ex, progress/current status with PT   Person([employer]) Educated Patient   Methods Explanation;Demonstration;Tactile cues;Verbal cues   Comprehension Verbalized understanding;Returned demonstration             PT Long Term Goals - 09/27/15 1014    PT LONG TERM GOAL #1   Title Patient will have a decrease in R knee pain to 5/10 or less at worst to promote ability to stand up from a chair as well as to improve ability to ambulate.    Baseline --   Time 6   Period Weeks   Status On-going   PT LONG TERM GOAL #2   Title Patient will improve bilateral hip strength and R knee strength by at least 1/2 MMT grade to promote ability to stand up from a chair as well as to improve ability to ambulate.    Time 6   Period Weeks   Status Partially Met   PT LONG TERM GOAL #3   Title Patient will be able to ambulate at least 500 ft without AD and no LOB to promote independence with ambulation and mobility.   Baseline pt currently ambulates with a rollator walker   Time 6   Period Weeks   Status On-going   PT LONG TERM GOAL #4   Title Patient will improve R knee extension AROM to 0 degrees to promote knee extension during stance phase of gait.    Baseline --   Time 6   Period Weeks   Status On-going   PT LONG TERM GOAL #5   Title Patient will improve R knee flexion AROM to 115 degrees to promote ability to  stand up from a chair.    Baseline --   Time 6   Period Weeks   Status On-going               Plan - 09/27/15 1014    Clinical Impression Statement Pt  tolerated session well without aggravation of R knee pain or discomfort.  Patient has demonstrated improved R knee flexion and extension AROM, knee extension strength, and overall decreased R knee pain since initial evaluation. She still demonstrates bilateral hip weakness, R knee pain, and difficulty walking and would benefit from continued skilled physical therapy intervention to address the aforementioned deficits.    Pt will benefit from skilled therapeutic intervention in order to improve on the following deficits Pain;Decreased range of motion;Postural dysfunction;Difficulty walking;Decreased strength;Abnormal gait   Rehab Potential Good   Clinical Impairments Affecting Rehab Potential pain   PT Frequency 2x / week   PT Duration 6 weeks   PT Treatment/Interventions Manual techniques;Therapeutic activities;Therapeutic exercise;Electrical Stimulation;Aquatic Therapy;Gait training;Neuromuscular re-education;Patient/family education;Ultrasound   PT Next Visit Plan patellar mobility, hip strengthening, R LE strengthening, gait   Consulted and Agree with Plan of Care Patient        Problem List There are no active problems to display for this patient.  Thank you for your referral.   Joneen Boers PT, DPT   09/27/2015, 4:22 PM  [place] [facility] [address] [place], [address] Phone: [phone]   Fax:  [phone]  Name: KAMI KUBE MRN: [id number] Date of Birth: Nov 19, 1950

## 2015-10-01 ENCOUNTER — Ambulatory Visit: Payer: Managed Care, Other (non HMO)

## 2015-10-04 ENCOUNTER — Ambulatory Visit: Payer: Managed Care, Other (non HMO)

## 2015-10-08 ENCOUNTER — Ambulatory Visit: Payer: Managed Care, Other (non HMO)

## 2015-10-10 ENCOUNTER — Ambulatory Visit: Payer: Managed Care, Other (non HMO)

## 2015-10-10 DIAGNOSIS — R531 Weakness: Secondary | ICD-10-CM

## 2015-10-10 DIAGNOSIS — Z4789 Encounter for other orthopedic aftercare: Secondary | ICD-10-CM

## 2015-10-10 DIAGNOSIS — M25561 Pain in right knee: Secondary | ICD-10-CM

## 2015-10-10 DIAGNOSIS — R262 Difficulty in walking, not elsewhere classified: Secondary | ICD-10-CM

## 2015-10-10 NOTE — Therapy (Signed)
[place] [facility] [address], [place], [address] Phone: [phone]   Fax:  [phone]  Physical Therapy Treatment  Patient Details  Name: Dana Morton MRN: [id number] Date of Birth: Jul 10, 1951 Referring Provider: Hessie Knows, MD  Encounter Date: 10/10/2015      PT End of Session - 10/10/15 1003    Visit Number 6   Number of Visits 13   Date for PT Re-Evaluation 10/25/15   Authorization Type 6   Authorization Time Period of 10   PT Start Time 1004   PT Stop Time 1047   PT Time Calculation (min) 43 min   Equipment Utilized During Treatment --  pt rollator walker   Activity Tolerance Patient tolerated treatment well   Behavior During Therapy WFL for tasks assessed/performed      Past Medical History  Diagnosis Date  . Hypertension   . Herpes simplex   . Fibrocystic disease of breast   . Hyperlipidemia   . Vitamin D deficiency   . Hypothyroidism   . Hypercholesteremia   . GERD (gastroesophageal reflux disease)   . Sleep apnea     no cpap  . Cancer ([place])   . Anemia     h/o     Past Surgical History  Procedure Laterality Date  . Abdominal hysterectomy    . Cholecystectomy    . Dilation and curettage of uterus    . Colonoscopy with propofol N/A 03/22/2015    Procedure: COLONOSCOPY WITH PROPOFOL;  Surgeon: Hulen Luster, MD;  Location[facility] ENDOSCOPY;  Service: Gastroenterology;  Laterality: N/A;  . Esophagogastroduodenoscopy (egd) with propofol N/A 03/22/2015    Procedure: ESOPHAGOGASTRODUODENOSCOPY (EGD) WITH PROPOFOL;  Surgeon: Hulen Luster, MD;  Location: [facility] ENDOSCOPY;  Service: Gastroenterology;  Laterality: N/A;  . Dg foot heel ([facility] hx)    . Knee arthroscopy Right 08/02/2015    Procedure: ARTHROSCOPY KNEE, PARTIAL MEDIAL MENISECTOMY, PLICA INCISION;  Surgeon: Hessie Knows, MD;  Location: [facility] ORS;  Service: Orthopedics;  Laterality: Right;    There were no vitals filed for this  visit.  Visit Diagnosis:  Orthopedic aftercare  Right knee pain  Weakness  Difficulty walking      Subjective Assessment - 10/10/15 1007    Subjective Pt states that her R knee is bothering her. Still feels like R knee snaps and pops.  Got a shot in her R knee last Monday. Not really sure if it helps but she is able to walk better once she has gotten adjusted. R knee pain 2/10 when walking from the waiting room to the treatment table. When she raises her R knee, she feels something pulling on the outside of her knee.    Pertinent History S/P R medial menisectomy 08/02/2015 secondary to severe knee pain which got to the point that she could not walk. Currently has knee pain with walking. Pt however had one day when she walked without knee pain (3 weeks post op). Still has knee pain and feels like her tendon is going to snap when she stands up and walks.    Patient Stated Goals To be able to walk again normally   Currently in Pain? Yes   Pain Score --  please see subjective   Multiple Pain Sites No           Objectives  Manual therapy:  Medial glide grade 3 to R patella in supine with knee in slight flexion propped with pillow  Soft tissue mobilization to R vastus lateralis in sitting with knee in flexion 90 degrees.      There-ex  Directed patient with supine SLR R hip flexion 10x  Standing retro stepping with R foot back in tandem stance 10x3 (reviewed and given as part of her HEP; pt demonstrated and verbalized understanding)  R LE SLS with L tip toe assist with emphasis on steady pelvis and use of R glute med muscle 10x2 with 5 second holds   Sit <> stand from low mat table (19 inches) with hip adductor pillow squeeze 10x2  Standing heel-toe raises with bilateral UE assist 10x3     Improved exercise technique, movement at target joints, use of target muscles after mod verbal, visual, tactile cues.     Some stiffness with medial patellar glide which improved  after manual therapy. TTP R vastus lateralis. Pt tolerated session well without aggravation of R knee discomfort.           PT Education - 10/10/15 1040    Education provided Yes   Education Details ther-ex   [employer]) Educated Patient   Methods Explanation;Demonstration;Tactile cues;Verbal cues   Comprehension Verbalized understanding;Returned demonstration             PT Long Term Goals - 09/27/15 1014    PT LONG TERM GOAL #1   Title Patient will have a decrease in R knee pain to 5/10 or less at worst to promote ability to stand up from a chair as well as to improve ability to ambulate.    Baseline --   Time 6   Period Weeks   Status On-going   PT LONG TERM GOAL #2   Title Patient will improve bilateral hip strength and R knee strength by at least 1/2 MMT grade to promote ability to stand up from a chair as well as to improve ability to ambulate.    Time 6   Period Weeks   Status Partially Met   PT LONG TERM GOAL #3   Title Patient will be able to ambulate at least 500 ft without AD and no LOB to promote independence with ambulation and mobility.   Baseline pt currently ambulates with a rollator walker   Time 6   Period Weeks   Status On-going   PT LONG TERM GOAL #4   Title Patient will improve R knee extension AROM to 0 degrees to promote knee extension during stance phase of gait.    Baseline --   Time 6   Period Weeks   Status On-going   PT LONG TERM GOAL #5   Title Patient will improve R knee flexion AROM to 115 degrees to promote ability to stand up from a chair.    Baseline --   Time 6   Period Weeks   Status On-going               Plan - 10/10/15 1040    Clinical Impression Statement Some stiffness with medial patellar glide which improved after manual therapy. TTP R vastus lateralis. Pt tolerated session well without aggravation of R knee discomfort. No knee pain at end of session.    Pt will benefit from skilled therapeutic intervention in  order to improve on the following deficits Pain;Decreased range of motion;Postural dysfunction;Difficulty walking;Decreased strength;Abnormal gait   Rehab Potential Good   Clinical Impairments Affecting Rehab Potential pain   PT Frequency 2x / week   PT Duration 6 weeks   PT Treatment/Interventions Manual techniques;Therapeutic activities;Therapeutic  exercise;Electrical Stimulation;Aquatic Therapy;Gait training;Neuromuscular re-education;Patient/family education;Ultrasound   PT Next Visit Plan patellar mobility, hip strengthening, R LE strengthening, gait   Consulted and Agree with Plan of Care Patient        Problem List There are no active problems to display for this patient.   Joneen Boers PT, DPT   10/10/2015, 12:56 PM  [place] [facility] [address] [place], [address] Phone: [phone]   Fax:  [phone]  Name: TASHIANA LAMARCA MRN: [id number] Date of Birth: [birth year]

## 2015-10-10 NOTE — Patient Instructions (Signed)
Standing retro stepping with R foot back in tandem stance 10x3 daily  (reviewed and given as part of her HEP; pt demonstrated and verbalized understanding)

## 2015-10-15 ENCOUNTER — Ambulatory Visit: Payer: Managed Care, Other (non HMO)

## 2015-10-16 ENCOUNTER — Ambulatory Visit: Payer: Managed Care, Other (non HMO) | Attending: Orthopedic Surgery

## 2015-10-16 DIAGNOSIS — R531 Weakness: Secondary | ICD-10-CM | POA: Diagnosis present

## 2015-10-16 DIAGNOSIS — Z4789 Encounter for other orthopedic aftercare: Secondary | ICD-10-CM | POA: Diagnosis not present

## 2015-10-16 DIAGNOSIS — M25561 Pain in right knee: Secondary | ICD-10-CM

## 2015-10-16 DIAGNOSIS — M6281 Muscle weakness (generalized): Secondary | ICD-10-CM | POA: Diagnosis present

## 2015-10-16 DIAGNOSIS — R262 Difficulty in walking, not elsewhere classified: Secondary | ICD-10-CM

## 2015-10-16 NOTE — Therapy (Signed)
[place] [facility] [address] [place], [address] Phone: [phone]   Fax:  [phone]  Physical Therapy Treatment  Patient Details  Name: Dana Morton MRN: [id number] Date of Birth: [birth year] Referring Provider: Hessie Knows, MD  Encounter Date: 10/16/2015      PT End of Session - 10/16/15 1020    Visit Number 7   Number of Visits 13   Date for PT Re-Evaluation 10/25/15   Authorization Type 7   Authorization Time Period of 10   PT Start Time 1020   PT Stop Time 1107   PT Time Calculation (min) 47 min   Equipment Utilized During Treatment --  pt rollator walker   Activity Tolerance Patient tolerated treatment well   Behavior During Therapy [facility] for tasks assessed/performed      Past Medical History  Diagnosis Date  . Hypertension   . Herpes simplex   . Fibrocystic disease of breast   . Hyperlipidemia   . Vitamin D deficiency   . Hypothyroidism   . Hypercholesteremia   . GERD (gastroesophageal reflux disease)   . Sleep apnea     no cpap  . Cancer ([place])   . Anemia     h/o     Past Surgical History  Procedure Laterality Date  . Abdominal hysterectomy    . Cholecystectomy    . Dilation and curettage of uterus    . Colonoscopy with propofol N/A 03/22/2015    Procedure: COLONOSCOPY WITH PROPOFOL;  Surgeon: Hulen Luster, MD;  Location: [facility] ENDOSCOPY;  Service: Gastroenterology;  Laterality: N/A;  . Esophagogastroduodenoscopy (egd) with propofol N/A 03/22/2015    Procedure: ESOPHAGOGASTRODUODENOSCOPY (EGD) WITH PROPOFOL;  Surgeon: Hulen Luster, MD;  Location: [facility] ENDOSCOPY;  Service: Gastroenterology;  Laterality: N/A;  . Dg foot heel ([facility] hx)    . Knee arthroscopy Right 08/02/2015    Procedure: ARTHROSCOPY KNEE, PARTIAL MEDIAL MENISECTOMY, PLICA INCISION;  Surgeon: Hessie Knows, MD;  Location: [facility] ORS;  Service: Orthopedics;  Laterality: Right;    There were no vitals filed for this visit.  Visit  Diagnosis:  Orthopedic aftercare  Right knee pain  Weakness  Difficulty walking      Subjective Assessment - 10/16/15 1021    Subjective R knee is doing pretty good. Feels like something is not right in it. Feels a raw feeling at her knee at night. No problems with HEP. Sometimes has to stand still after getting up before walking because of knee pain. No knee pain currently or when she stood up and walked from the waiting room to the treatment table. Feels like something is pulling in her knee.    Pertinent History S/P R medial menisectomy 08/02/2015 secondary to severe knee pain which got to the point that she could not walk. Currently has knee pain with walking. Pt however had one day when she walked without knee pain (3 weeks post op). Still has knee pain and feels like her tendon is going to snap when she stands up and walks.    Patient Stated Goals To be able to walk again normally   Currently in Pain? No/denies   Pain Score 0-No pain   Multiple Pain Sites No         Objectives  Manual therapy:  Medial glide grade 3 to R patella in supine with knee in slight flexion propped with pillow   McConnell Tape to R patella to promote medial glide.   Low  dye tape to R foot to support arch and redistribute pressure from forefoot to rest of the foot (slight decrease in R foot pain with gait)  Recommended for pt to get arch supports. Pt verbalized understanding.     There-ex  Directed patient with gait with rollator 100 ft x 2  Gait with SPC on L side 50 ft, 100 ft with CGA, then 100 ft SBA  Sit <> stand from low mat table (19 inches) with hip adductor pillow squeeze 10x2  R LE SLS with L tip toe assist with emphasis on steady pelvis and use of R glute med muscle 10x with 5 second holds    Improved exercise technique, movement at target joints, use of target muscles after min to mod verbal, visual, tactile cues.     Decreased foot pain with gait after low dye taping. No  knee pain with exercises and gait throughout session. Able to ambulate with SPC without LOB. Tried McConnell tape to see if promoting medial glide to R patella helps with discomfort outside of therapy.                            PT Education - 10/16/15 1114    Education provided Yes   Education Details ther-ex, arch supports   Person(s) Educated Patient   Methods Explanation;Demonstration;Verbal cues;Tactile cues   Comprehension Verbalized understanding;Returned demonstration             PT Long Term Goals - 09/27/15 1014    PT LONG TERM GOAL #1   Title Patient will have a decrease in R knee pain to 5/10 or less at worst to promote ability to stand up from a chair as well as to improve ability to ambulate.    Baseline --   Time 6   Period Weeks   Status On-going   PT LONG TERM GOAL #2   Title Patient will improve bilateral hip strength and R knee strength by at least 1/2 MMT grade to promote ability to stand up from a chair as well as to improve ability to ambulate.    Time 6   Period Weeks   Status Partially Met   PT LONG TERM GOAL #3   Title Patient will be able to ambulate at least 500 ft without AD and no LOB to promote independence with ambulation and mobility.   Baseline pt currently ambulates with a rollator walker   Time 6   Period Weeks   Status On-going   PT LONG TERM GOAL #4   Title Patient will improve R knee extension AROM to 0 degrees to promote knee extension during stance phase of gait.    Baseline --   Time 6   Period Weeks   Status On-going   PT LONG TERM GOAL #5   Title Patient will improve R knee flexion AROM to 115 degrees to promote ability to stand up from a chair.    Baseline --   Time 6   Period Weeks   Status On-going               Plan - 10/16/15 1115    Clinical Impression Statement Decreased foot pain with gait after low dye taping. No knee pain with exercises and gait throughout session. Able to ambulate  with SPC without LOB. Tried McConnell tape to see if promoting medial glide to R patella helps with discomfort outside of therapy.    Pt will benefit  from skilled therapeutic intervention in order to improve on the following deficits Pain;Decreased range of motion;Postural dysfunction;Difficulty walking;Decreased strength;Abnormal gait   Rehab Potential Good   Clinical Impairments Affecting Rehab Potential pain   PT Frequency 2x / week   PT Duration 6 weeks   PT Treatment/Interventions Manual techniques;Therapeutic activities;Therapeutic exercise;Electrical Stimulation;Aquatic Therapy;Gait training;Neuromuscular re-education;Patient/family education;Ultrasound   PT Next Visit Plan patellar mobility, hip strengthening, R LE strengthening, gait   Consulted and Agree with Plan of Care Patient        Problem List There are no active problems to display for this patient.   Joneen Boers PT, DPT   10/16/2015, 11:25 AM  [place] [facility] [address], [place], [address] Phone: [phone]   Fax:  [phone]  Name: Dana Morton MRN: [id number] Date of Birth: [birth year]

## 2015-10-17 ENCOUNTER — Ambulatory Visit: Payer: Managed Care, Other (non HMO)

## 2015-10-17 DIAGNOSIS — R262 Difficulty in walking, not elsewhere classified: Secondary | ICD-10-CM

## 2015-10-17 DIAGNOSIS — M25561 Pain in right knee: Secondary | ICD-10-CM

## 2015-10-17 DIAGNOSIS — R531 Weakness: Secondary | ICD-10-CM

## 2015-10-17 DIAGNOSIS — Z4789 Encounter for other orthopedic aftercare: Secondary | ICD-10-CM | POA: Diagnosis not present

## 2015-10-17 NOTE — Therapy (Signed)
[place] [facility] [address], [place], [address] Phone: [phone]   Fax:  [phone]  Physical Therapy Treatment  Patient Details  Name: Dana Morton MRN: [id number] Date of Birth: 06-14-1951 Referring Provider: Hessie Knows, MD  Encounter Date: 10/17/2015      PT End of Session - 10/17/15 0948    Visit Number 8   Number of Visits 13   Date for PT Re-Evaluation 10/25/15   Authorization Type 8   Authorization Time Period of 10   PT Start Time 0948   PT Stop Time 1030   PT Time Calculation (min) 42 min   Equipment Utilized During Treatment --   Activity Tolerance Patient tolerated treatment well   Behavior During Therapy [facility] for tasks assessed/performed      Past Medical History  Diagnosis Date  . Hypertension   . Herpes simplex   . Fibrocystic disease of breast   . Hyperlipidemia   . Vitamin D deficiency   . Hypothyroidism   . Hypercholesteremia   . GERD (gastroesophageal reflux disease)   . Sleep apnea     no cpap  . Cancer ([place])   . Anemia     h/o     Past Surgical History  Procedure Laterality Date  . Abdominal hysterectomy    . Cholecystectomy    . Dilation and curettage of uterus    . Colonoscopy with propofol N/A 03/22/2015    Procedure: COLONOSCOPY WITH PROPOFOL;  Surgeon: Hulen Luster, MD;  Location: [facility] ENDOSCOPY;  Service: Gastroenterology;  Laterality: N/A;  . Esophagogastroduodenoscopy (egd) with propofol N/A 03/22/2015    Procedure: ESOPHAGOGASTRODUODENOSCOPY (EGD) WITH PROPOFOL;  Surgeon: Hulen Luster, MD;  Location: [facility] ENDOSCOPY;  Service: Gastroenterology;  Laterality: N/A;  . Dg foot heel ([facility] hx)    . Knee arthroscopy Right 08/02/2015    Procedure: ARTHROSCOPY KNEE, PARTIAL MEDIAL MENISECTOMY, PLICA INCISION;  Surgeon: Hessie Knows, MD;  Location: [facility] ORS;  Service: Orthopedics;  Laterality: Right;    There were no vitals filed for this visit.  Visit Diagnosis:   Orthopedic aftercare  Right knee pain  Weakness  Difficulty walking      Subjective Assessment - 10/17/15 0950    Subjective The tape for her knee and foot bothered her yesterday and took it off. R knee feels pretty good today. 2-3/10 R knee pain when walking from waiting room to gym. Stopped using her rollator walker this morning. Forgot the cane.    Pertinent History S/P R medial menisectomy 08/02/2015 secondary to severe knee pain which got to the point that she could not walk. Currently has knee pain with walking. Pt however had one day when she walked without knee pain (3 weeks post op). Still has knee pain and feels like her tendon is going to snap when she stands up and walks.    Patient Stated Goals To be able to walk again normally   Pain Score 3   2-3/10 R knee pain when walking from waiting room to treatment room         Objectives  Manual therapy:  Medial glide grade 3 to R patella in supine with knee in slight flexion propped with pillow   STM R vastus lateralis in supine.   Improved patellar mobility and slight decrease in vastus lateralis sensitivity after manual therapy    There-ex  Directed patient gait with SPC on L side 300 ft with SBA  Then without AD  100 ft standing alternating toe taps onto treadmill without UE assist 6x, then 5x, then with bilateral UE assist 5x2 each LE for glute med muscle use  forward step up onto Air Ex pad 10x2 with R LE with bilateral UE assist  side step up onto Air Ex pad 10x2 with R LE with bilateral UE assist  Seated R knee flexion resisting green band 10x (slight R lateral knee discomfort, decreased with rest)  Seated hip adduction pillow squeeze 10x5 second holds  Pt was recommended to use SPC for now if not using her rollator walker for safety and to help decrease pressure on R knee when walking. Pt demonstrated and verbalized understanding.    Improved exercise technique, movement at target joints, use of target  muscles after mod verbal, visual, tactile cues.     Pt able to ambulate with SPC and without AD without aggravation of knee discomfort. No LOB. Some R LE weakness felt during standing exercises. Patient will benefit from continued skilled physical therapy services to promote ability to ambulate, help decrease knee pain, and improve function.                      PT Education - 10/17/15 1022    Education provided Yes   Education Details ther-ex   [employer]) Educated Patient   Methods Explanation;Demonstration;Tactile cues;Verbal cues   Comprehension Verbalized understanding;Returned demonstration             PT Long Term Goals - 09/27/15 1014    PT LONG TERM GOAL #1   Title Patient will have a decrease in R knee pain to 5/10 or less at worst to promote ability to stand up from a chair as well as to improve ability to ambulate.    Baseline --   Time 6   Period Weeks   Status On-going   PT LONG TERM GOAL #2   Title Patient will improve bilateral hip strength and R knee strength by at least 1/2 MMT grade to promote ability to stand up from a chair as well as to improve ability to ambulate.    Time 6   Period Weeks   Status Partially Met   PT LONG TERM GOAL #3   Title Patient will be able to ambulate at least 500 ft without AD and no LOB to promote independence with ambulation and mobility.   Baseline pt currently ambulates with a rollator walker   Time 6   Period Weeks   Status On-going   PT LONG TERM GOAL #4   Title Patient will improve R knee extension AROM to 0 degrees to promote knee extension during stance phase of gait.    Baseline --   Time 6   Period Weeks   Status On-going   PT LONG TERM GOAL #5   Title Patient will improve R knee flexion AROM to 115 degrees to promote ability to stand up from a chair.    Baseline --   Time 6   Period Weeks   Status On-going               Plan - 10/17/15 1021    Clinical Impression Statement Pt able to  ambulate with SPC and without AD without aggravation of knee discomfort. No LOB. Some R LE weakness felt during standing exercises. Patient will benefit from continued skilled physical therapy services to promote ability to ambulate, help decrease knee pain, and improve function.    Pt will benefit from  skilled therapeutic intervention in order to improve on the following deficits Pain;Decreased range of motion;Postural dysfunction;Difficulty walking;Decreased strength;Abnormal gait   Rehab Potential Good   Clinical Impairments Affecting Rehab Potential pain   PT Frequency 2x / week   PT Duration 6 weeks   PT Treatment/Interventions Manual techniques;Therapeutic activities;Therapeutic exercise;Electrical Stimulation;Aquatic Therapy;Gait training;Neuromuscular re-education;Patient/family education;Ultrasound   PT Next Visit Plan patellar mobility, hip strengthening, R LE strengthening, gait   Consulted and Agree with Plan of Care Patient        Problem List There are no active problems to display for this patient.  Joneen Boers PT, DPT   10/17/2015, 11:53 AM  [place] [facility] [address] [place], [address] Phone: [phone]   Fax:  [phone]  Name: Dana Morton MRN: [id number] Date of Birth: [birth year]

## 2015-10-22 ENCOUNTER — Ambulatory Visit: Payer: Managed Care, Other (non HMO)

## 2015-10-22 ENCOUNTER — Other Ambulatory Visit: Payer: Self-pay | Admitting: Internal Medicine

## 2015-10-22 DIAGNOSIS — R262 Difficulty in walking, not elsewhere classified: Secondary | ICD-10-CM

## 2015-10-22 DIAGNOSIS — Z4789 Encounter for other orthopedic aftercare: Secondary | ICD-10-CM

## 2015-10-22 DIAGNOSIS — R531 Weakness: Secondary | ICD-10-CM

## 2015-10-22 DIAGNOSIS — Z1231 Encounter for screening mammogram for malignant neoplasm of breast: Secondary | ICD-10-CM

## 2015-10-22 DIAGNOSIS — M25561 Pain in right knee: Secondary | ICD-10-CM

## 2015-10-22 NOTE — Therapy (Signed)
[place] [facility] [address], [place], [address] Phone: [phone]   Fax:  [phone]  Physical Therapy Treatment  Patient Details  Name: Dana Morton MRN: [id number] Date of Birth: 1950/12/17 Referring Provider: Hessie Knows, MD  Encounter Date: 10/22/2015      PT End of Session - 10/22/15 1028    Visit Number 9   Number of Visits 13   Date for PT Re-Evaluation 10/25/15   Authorization Type 9   Authorization Time Period of 10   PT Start Time 1028   PT Stop Time 1117   PT Time Calculation (min) 49 min   Activity Tolerance Patient tolerated treatment well   Behavior During Therapy [facility] for tasks assessed/performed      Past Medical History  Diagnosis Date  . Hypertension   . Herpes simplex   . Fibrocystic disease of breast   . Hyperlipidemia   . Vitamin D deficiency   . Hypothyroidism   . Hypercholesteremia   . GERD (gastroesophageal reflux disease)   . Sleep apnea     no cpap  . Cancer ([place])   . Anemia     h/o     Past Surgical History  Procedure Laterality Date  . Abdominal hysterectomy    . Cholecystectomy    . Dilation and curettage of uterus    . Colonoscopy with propofol N/A 03/22/2015    Procedure: COLONOSCOPY WITH PROPOFOL;  Surgeon: Hulen Luster, MD;  Location: [facility] ENDOSCOPY;  Service: Gastroenterology;  Laterality: N/A;  . Esophagogastroduodenoscopy (egd) with propofol N/A 03/22/2015    Procedure: ESOPHAGOGASTRODUODENOSCOPY (EGD) WITH PROPOFOL;  Surgeon: Hulen Luster, MD;  Location: [facility] ENDOSCOPY;  Service: Gastroenterology;  Laterality: N/A;  . Dg foot heel ([facility] hx)    . Knee arthroscopy Right 08/02/2015    Procedure: ARTHROSCOPY KNEE, PARTIAL MEDIAL MENISECTOMY, PLICA INCISION;  Surgeon: Hessie Knows, MD;  Location: [facility] ORS;  Service: Orthopedics;  Laterality: Right;    There were no vitals filed for this visit.      Subjective Assessment - 10/22/15 1030    Subjective R knee  still feels like it's not healed. Feels popping when she straightens her R leg. Was swollen yesterday.    Pertinent History S/P R medial menisectomy 08/02/2015 secondary to severe knee pain which got to the point that she could not walk. Currently has knee pain with walking. Pt however had one day when she walked without knee pain (3 weeks post op). Still has knee pain and feels like her tendon is going to snap when she stands up and walks.    Patient Stated Goals To be able to walk again normally   Currently in Pain? Yes   Pain Score 3   2-3/10 medial patellar twinge with knee extension   Pain Orientation Right   Multiple Pain Sites No            [facility] PT Assessment - 10/22/15 1054    AROM   Right Knee Extension -5  sitting   Right Knee Flexion 115  sitting     S/P 11 weeks  Objectives   [place] E-stim R VMO x 15 min at 19.5 mA. Quad set during 10 seconds on/ rest at 10 seconds off   There-ex  Directed patient gait with SPC on L side 500 ft with SBA  L tip-toe, standing on R LE with finger touch assist 7x 5 second holds for R glute med muscle use  Seated R knee flexion and extension AROM  Reviewed progress/current status with R knee AROM with pt.    standing alternating toe taps onto first regular step  10x each LE with bilateral UE assist for glute med use   Seated knee flexion/extension rolling basketball (mid range, comfortable range) 10x2 R knee (decreased R knee discomfort)   Improved exercise technique, movement at target joints, use of target muscles after mod verbal, visual, tactile cues.     Some discomfort with R knee during standing activities as well as with knee flexion activities to end range. Slight decrease in R knee discomfort after seated knee flexion/extension (mid range) rolling a basketball exercise. Decreased R knee twinge with knee extension after e-stim. Improved R knee flexion/extension AROM, overall decreased knee pain, and ability to  ambulate with a less restrictive AD since initial evaluation.             PT Education - 10/22/15 1046    Education provided Yes   Education Details ther-ex, e-stm   Person(s) Educated Patient   Methods Explanation;Tactile cues;Verbal cues;Demonstration   Comprehension Verbalized understanding;Returned demonstration             PT Long Term Goals - 10/22/15 1102    PT LONG TERM GOAL #1   Title Patient will have a decrease in R knee pain to 5/10 or less at worst to promote ability to stand up from a chair as well as to improve ability to ambulate.    Baseline 5-6/10 R knee pain at most for the past 5 days (when standing from a sitting position)   Time 6   Period Weeks   Status On-going   PT LONG TERM GOAL #2   Title Patient will improve bilateral hip strength and R knee strength by at least 1/2 MMT grade to promote ability to stand up from a chair as well as to improve ability to ambulate.    Time 6   Period Weeks   Status Partially Met   PT LONG TERM GOAL #3   Title Patient will be able to ambulate at least 500 ft without AD and no LOB to promote independence with ambulation and mobility.   Baseline pt ambulated with a rollator walker; pt currently now able to ambulate 500 ft with SPC   Time 6   Period Weeks   Status On-going   PT LONG TERM GOAL #4   Title Patient will improve R knee extension AROM to 0 degrees to promote knee extension during stance phase of gait.    Baseline Currently -5 degrees R knee extension AROM in sitting   Time 6   Period Weeks   Status On-going   PT LONG TERM GOAL #5   Title Patient will improve R knee flexion AROM to 115 degrees to promote ability to stand up from a chair.    Baseline 115 degrees R knee flexion in sitting.    Time 6   Period Weeks   Status Achieved               Plan - 10/22/15 1026    Clinical Impression Statement Some discomfort with R knee during standing activities as well as with knee flexion activities  to end range. Slight decrease in R knee discomfort after seated knee flexion/extension (mid range) rolling a basketball exercise. Decreased R knee twinge with knee extension after e-stim. Improved R knee flexion/extension AROM, overall decreased knee pain, and ability to ambulate with a less restrictive AD  since initial evaluation.    Rehab Potential Good   Clinical Impairments Affecting Rehab Potential pain   PT Frequency 2x / week   PT Duration 6 weeks   PT Treatment/Interventions Manual techniques;Therapeutic activities;Therapeutic exercise;Electrical Stimulation;Aquatic Therapy;Gait training;Neuromuscular re-education;Patient/family education;Ultrasound   PT Next Visit Plan patellar mobility, hip strengthening, R LE strengthening, gait   Consulted and Agree with Plan of Care Patient      Patient will benefit from skilled therapeutic intervention in order to improve the following deficits and impairments:  Pain, Decreased range of motion, Postural dysfunction, Difficulty walking, Decreased strength, Abnormal gait  Visit Diagnosis: Orthopedic aftercare  Right knee pain  Weakness  Difficulty walking     Problem List There are no active problems to display for this patient.   Joneen Boers PT, DPT   10/22/2015, 3:36 PM  [place] [facility] [address] [place], [address] Phone: [phone]   Fax:  [phone]  Name: Dana Morton MRN: [id number] Date of Birth: 04-Dec-1950

## 2015-10-24 ENCOUNTER — Ambulatory Visit: Payer: Managed Care, Other (non HMO)

## 2015-10-24 DIAGNOSIS — R262 Difficulty in walking, not elsewhere classified: Secondary | ICD-10-CM

## 2015-10-24 DIAGNOSIS — M6281 Muscle weakness (generalized): Secondary | ICD-10-CM

## 2015-10-24 DIAGNOSIS — Z4789 Encounter for other orthopedic aftercare: Secondary | ICD-10-CM | POA: Diagnosis not present

## 2015-10-24 DIAGNOSIS — M25561 Pain in right knee: Secondary | ICD-10-CM

## 2015-10-24 NOTE — Therapy (Signed)
[facility] [address], [place], [address] Phone: [phone]   Fax:  [phone]  Physical Therapy Treatment And Progress Report  Patient Details  Name: Dana Morton MRN: [id number] Date of Birth: [birth year] Referring Provider: Hessie Knows, MD  Encounter Date: 10/24/2015      PT End of Session - 10/24/15 1037    Visit Number 10   Number of Visits 21   Date for PT Re-Evaluation 11/22/15   Authorization Type 1   Authorization Time Period of 10   PT Start Time 1035   PT Stop Time 1117   PT Time Calculation (min) 42 min   Activity Tolerance Patient tolerated treatment well   Behavior During Therapy [facility] for tasks assessed/performed      Past Medical History  Diagnosis Date  . Hypertension   . Herpes simplex   . Fibrocystic disease of breast   . Hyperlipidemia   . Vitamin D deficiency   . Hypothyroidism   . Hypercholesteremia   . GERD (gastroesophageal reflux disease)   . Sleep apnea     no cpap  . Cancer ([place])   . Anemia     h/o     Past Surgical History  Procedure Laterality Date  . Abdominal hysterectomy    . Cholecystectomy    . Dilation and curettage of uterus    . Colonoscopy with propofol N/A 03/22/2015    Procedure: COLONOSCOPY WITH PROPOFOL;  Surgeon: Hulen Luster, MD;  Location: [facility] ENDOSCOPY;  Service: Gastroenterology;  Laterality: N/A;  . Esophagogastroduodenoscopy (egd) with propofol N/A 03/22/2015    Procedure: ESOPHAGOGASTRODUODENOSCOPY (EGD) WITH PROPOFOL;  Surgeon: Hulen Luster, MD;  Location: [facility] ENDOSCOPY;  Service: Gastroenterology;  Laterality: N/A;  . Dg foot heel ([facility] hx)    . Knee arthroscopy Right 08/02/2015    Procedure: ARTHROSCOPY KNEE, PARTIAL MEDIAL MENISECTOMY, PLICA INCISION;  Surgeon: Hessie Knows, MD;  Location: [facility] ORS;  Service: Orthopedics;  Laterality: Right;    There were no vitals filed for this visit.      Subjective Assessment - 10/24/15 1037     Subjective R knee is coming along. No pain currently (pt sitting). The bottom of her R foot is bothering her when she walks (1-2/10) when going to the waiting room to the treatment table. The R knee still hurts when standing up from a chair. 5/10 when standing up from a chair. 7-8/10 when standing up from sitting 1 hour or longer.   PLOF: independent ambulation but with a limp on the R side. Pt also states that PT is helping with her knee.    Pertinent History S/P R medial menisectomy 08/02/2015 secondary to severe knee pain which got to the point that she could not walk. Currently has knee pain with walking. Pt however had one day when she walked without knee pain (3 weeks post op). Still has knee pain and feels like her tendon is going to snap when she stands up and walks.    Patient Stated Goals To be able to walk again normally   Currently in Pain? Yes   Pain Score 5   when standing up from a chair       Objectives  There-ex  Sit <> stand from low mat table with hip adductor pillow squeeze 10x  Then 6x Side stepping at treadmill bars (5 ft) 8x each direction (R gastroc tightness)  Standing bilateral ankle DF/PF x 1 min for gentle gastroc  stretch  Forward wedding march 32 ft with SPC  Standing R gastroc stretch 10 seconds x 5  Rest breaks provided secondary to fatigue  R gastroc tightness with standing exercises.   Improved exercise technique, movement at target joints, use of target muscles after mod verbal, visual, tactile cues.      Manual therapy  Medial glide to R patella in sitting grade 3 STM to R vastus lateralis  No knee pain with sit <> stand from low mat table after manual therapy       Patient has demonstrated decreased R knee pain with standing up from the sitting position following manual therapy techniques to promote medial glide to the patella, as well as improved R knee flexion and extension AROM, and R knee extension strength since initial evaluation.  Pt also now currently ambulating with a SPC on her L side instead of her rollator. Patient still demonstrates bilateral hip weakness, R knee pain, difficulty walking, and difficulty standing up from the seated position (secondary to R knee pain) and would benefit from continued skilled physical therapy intervention to address the aforementioned deficits.                      PT Education - 10/24/15 1043    Education provided Yes   Education Details ther-ex   [employer]) Educated Patient   Methods Explanation;Demonstration;Tactile cues;Verbal cues   Comprehension Verbalized understanding;Returned demonstration             PT Long Term Goals - 10/24/15 2004    PT LONG TERM GOAL #1   Title Patient will have a decrease in R knee pain to 5/10 or less at worst to promote ability to stand up from a chair as well as to improve ability to ambulate.    Baseline 7-8/10 when standing up after sitting for one hour or greater   Time 4   Period Weeks   Status On-going   PT LONG TERM GOAL #2   Title Patient will improve bilateral hip strength and R knee strength by at least 1/2 MMT grade to promote ability to stand up from a chair as well as to improve ability to ambulate.    Time 4   Period Weeks   Status Partially Met   PT LONG TERM GOAL #3   Title Patient will be able to ambulate at least 500 ft without AD and no LOB to promote independence with ambulation and mobility.   Baseline pt ambulated with a rollator walker; pt currently now able to ambulate 500 ft with SPC   Time 4   Period Weeks   Status On-going   PT LONG TERM GOAL #4   Title Patient will improve R knee extension AROM to 0 degrees to promote knee extension during stance phase of gait.    Baseline Currently -5 degrees R knee extension AROM in sitting   Time 4   Period Weeks   Status On-going   PT LONG TERM GOAL #5   Title Patient will improve R knee flexion AROM to 115 degrees to promote ability to stand up from  a chair.    Baseline 115 degrees R knee flexion in sitting.    Time 6   Period Weeks   Status Achieved               Plan - 10/24/15 1044    Clinical Impression Statement Patient has demonstrated decreased R knee pain with standing up from the sitting  position following manual therapy techniques to promote medial glide to the patella, as well as improved R knee flexion and extension AROM, and R knee extension strength since initial evaluation. Pt also now currently ambulating with a SPC on her L side instead of her rollator. Patient still demonstrates bilateral hip weakness, R knee pain, difficulty walking, and difficulty standing up from the seated position (secondary to R knee pain) and would benefit from continued skilled physical therapy intervention to address the aforementioned deficits.    Rehab Potential Good   Clinical Impairments Affecting Rehab Potential pain, insurance   PT Frequency 2x / week   PT Duration 6 weeks   PT Treatment/Interventions Manual techniques;Therapeutic activities;Therapeutic exercise;Electrical Stimulation;Aquatic Therapy;Gait training;Neuromuscular re-education;Patient/family education;Ultrasound   PT Next Visit Plan patellar mobility, hip strengthening, R LE strengthening, gait   Consulted and Agree with Plan of Care Patient      Patient will benefit from skilled therapeutic intervention in order to improve the following deficits and impairments:  Pain, Decreased range of motion, Postural dysfunction, Difficulty walking, Decreased strength, Abnormal gait  Visit Diagnosis: Difficulty in walking, not elsewhere classified - Plan: PT plan of care cert/re-cert  Pain in right knee - Plan: PT plan of care cert/re-cert  Muscle weakness (generalized) - Plan: PT plan of care cert/re-cert       G-Codes - November 10, 2015 1119    Functional Assessment Tool Used Patient interview, clinical presentation   Functional Limitation Mobility: Walking and moving around    Mobility: Walking and Moving Around Current Status (Q4210) At least 60 percent but less than 80 percent impaired, limited or restricted   Mobility: Walking and Moving Around Goal Status (Z1281) At least 20 percent but less than 40 percent impaired, limited or restricted      Problem List There are no active problems to display for this patient.  Thank you for your referral.  Joneen Boers PT, DPT   November 10, 2015, 8:22 PM  [facility] [address] [place], [address] Phone: [phone]   Fax:  [phone]  Name: Dana Morton MRN: [id number] Date of Birth: [birth year]

## 2015-10-30 ENCOUNTER — Ambulatory Visit
Admission: RE | Admit: 2015-10-30 | Discharge: 2015-10-30 | Disposition: A | Discharge status: Home / Self Care | Payer: Managed Care, Other (non HMO) | Source: Ambulatory Visit | Attending: Internal Medicine | Admitting: Internal Medicine

## 2015-10-30 ENCOUNTER — Ambulatory Visit: Payer: Managed Care, Other (non HMO)

## 2015-10-30 DIAGNOSIS — Z4789 Encounter for other orthopedic aftercare: Secondary | ICD-10-CM | POA: Diagnosis not present

## 2015-10-30 DIAGNOSIS — Z1231 Encounter for screening mammogram for malignant neoplasm of breast: Secondary | ICD-10-CM | POA: Diagnosis not present

## 2015-10-30 DIAGNOSIS — M6281 Muscle weakness (generalized): Secondary | ICD-10-CM

## 2015-10-30 DIAGNOSIS — M25561 Pain in right knee: Secondary | ICD-10-CM

## 2015-10-30 DIAGNOSIS — R262 Difficulty in walking, not elsewhere classified: Secondary | ICD-10-CM

## 2015-10-30 NOTE — Therapy (Signed)
[facility] [address] [place], [address] Phone: [phone]   Fax:  [phone]  Physical Therapy Treatment  Patient Details  Name: Dana Morton MRN: [id number] Date of Birth: [birth year] Referring Provider: Hessie Knows, MD  Encounter Date: 10/30/2015      PT End of Session - 10/30/15 1722    Visit Number 11   Number of Visits 21   Date for PT Re-Evaluation 11/22/15   Authorization Type 2   Authorization Time Period of 10   PT Start Time 1722   PT Stop Time 1809   PT Time Calculation (min) 47 min   Activity Tolerance Patient tolerated treatment well   Behavior During Therapy [facility] for tasks assessed/performed      Past Medical History  Diagnosis Date  . Hypertension   . Herpes simplex   . Fibrocystic disease of breast   . Hyperlipidemia   . Vitamin D deficiency   . Hypothyroidism   . Hypercholesteremia   . GERD (gastroesophageal reflux disease)   . Sleep apnea     no cpap  . Cancer ([place])   . Anemia     h/o     Past Surgical History  Procedure Laterality Date  . Abdominal hysterectomy    . Cholecystectomy    . Dilation and curettage of uterus    . Colonoscopy with propofol N/A 03/22/2015    Procedure: COLONOSCOPY WITH PROPOFOL;  Surgeon: Hulen Luster, MD;  Location: [facility] ENDOSCOPY;  Service: Gastroenterology;  Laterality: N/A;  . Esophagogastroduodenoscopy (egd) with propofol N/A 03/22/2015    Procedure: ESOPHAGOGASTRODUODENOSCOPY (EGD) WITH PROPOFOL;  Surgeon: Hulen Luster, MD;  Location: [facility] ENDOSCOPY;  Service: Gastroenterology;  Laterality: N/A;  . Dg foot heel ([facility] hx)    . Knee arthroscopy Right 08/02/2015    Procedure: ARTHROSCOPY KNEE, PARTIAL MEDIAL MENISECTOMY, PLICA INCISION;  Surgeon: Hessie Knows, MD;  Location: [facility] ORS;  Service: Orthopedics;  Laterality: Right;    There were no vitals filed for this visit.      Subjective Assessment - 10/30/15 1726    Subjective R knee  has been doing pretty good. Feels sore today because she has been up on it a lot today from doctor's appointments. Used to rollator today becuase of knee pain.    Pertinent History S/P R medial menisectomy 08/02/2015 secondary to severe knee pain which got to the point that she could not walk. Currently has knee pain with walking. Pt however had one day when she walked without knee pain (3 weeks post op). Still has knee pain and feels like her tendon is going to snap when she stands up and walks.    Patient Stated Goals To be able to walk again normally   Currently in Pain? Yes   Pain Score 4   when standing up from the sitting position.     Pt adds that she feels a burning sensation on her R forefoot at times when she is standing on it.      Objectives   Manual therapy:   Medial glide to R patella in sitting grade 3 STM to R vastus lateralis   There-ex  Sit <> stand from low mat table with hip adductor pillow squeeze 10x2  Forward wedding march 32 ft x 2 with SPC  Standing bilateral ankle DF/PF x 1 min 30 seconds on rocker board for gentle gastroc stretch (Pt states feeling a burning sensation at her forefoot which decreased  with rest)  (-) Slump test R LE S/L R hip abduction 4x (increased knee pain, decreased with rest) S/L clam shell 5x4 R hip  NuStep seat 8 Level 1 x 5 min (cues for proper technique)  Improved exercise technique, movement at target joints, use of target muscles after min to mod verbal, visual, tactile cues.     Decreased R knee pain with sit <> stand after manual therapy. Slight increase in R knee pain with S/L R hip abduction which decreased with rest. Increased burning sensation to R forefoot with standing bilateral ankle DF/PF on rocker board which decreased with rest. Fair tolerance to therapy today.              PT Education - 10/30/15 1818    Education provided Yes   Education Details ther-ex   Person(s) Educated Patient   Methods  Explanation;Demonstration;Tactile cues;Verbal cues   Comprehension Verbalized understanding;Returned demonstration             PT Long Term Goals - 10/24/15 2004    PT LONG TERM GOAL #1   Title Patient will have a decrease in R knee pain to 5/10 or less at worst to promote ability to stand up from a chair as well as to improve ability to ambulate.    Baseline 7-8/10 when standing up after sitting for one hour or greater   Time 4   Period Weeks   Status On-going   PT LONG TERM GOAL #2   Title Patient will improve bilateral hip strength and R knee strength by at least 1/2 MMT grade to promote ability to stand up from a chair as well as to improve ability to ambulate.    Time 4   Period Weeks   Status Partially Met   PT LONG TERM GOAL #3   Title Patient will be able to ambulate at least 500 ft without AD and no LOB to promote independence with ambulation and mobility.   Baseline pt ambulated with a rollator walker; pt currently now able to ambulate 500 ft with SPC   Time 4   Period Weeks   Status On-going   PT LONG TERM GOAL #4   Title Patient will improve R knee extension AROM to 0 degrees to promote knee extension during stance phase of gait.    Baseline Currently -5 degrees R knee extension AROM in sitting   Time 4   Period Weeks   Status On-going   PT LONG TERM GOAL #5   Title Patient will improve R knee flexion AROM to 115 degrees to promote ability to stand up from a chair.    Baseline 115 degrees R knee flexion in sitting.    Time 6   Period Weeks   Status Achieved               Plan - 10/30/15 1818    Clinical Impression Statement Decreased R knee pain with sit <> stand after manual therapy. Slight increase in R knee pain with S/L R hip abduction which decreased with rest. Increased burning sensation to R forefoot with standing bilateral ankle DF/PF on rocker board which decreased with rest. Fair tolerance to therapy today.    Rehab Potential Good   Clinical  Impairments Affecting Rehab Potential pain, insurance   PT Frequency 2x / week   PT Duration 6 weeks   PT Treatment/Interventions Manual techniques;Therapeutic activities;Therapeutic exercise;Electrical Stimulation;Aquatic Therapy;Gait training;Neuromuscular re-education;Patient/family education;Ultrasound   PT Next Visit Plan patellar mobility, hip strengthening, R  LE strengthening, gait   Consulted and Agree with Plan of Care Patient      Patient will benefit from skilled therapeutic intervention in order to improve the following deficits and impairments:  Pain, Decreased range of motion, Postural dysfunction, Difficulty walking, Decreased strength, Abnormal gait  Visit Diagnosis: Difficulty in walking, not elsewhere classified  Pain in right knee  Muscle weakness (generalized)     Problem List There are no active problems to display for this patient.   Joneen Boers PT, DPT   10/30/2015, 6:22 PM  [facility] [address] [place], [address] Phone: [phone]   Fax:  [phone]  Name: EMSLEE LOPEZMARTINEZ MRN: [id number] Date of Birth: 1950-09-12

## 2015-11-01 ENCOUNTER — Ambulatory Visit: Payer: Managed Care, Other (non HMO)

## 2015-11-05 ENCOUNTER — Ambulatory Visit: Payer: Managed Care, Other (non HMO)

## 2015-11-05 DIAGNOSIS — Z4789 Encounter for other orthopedic aftercare: Secondary | ICD-10-CM | POA: Diagnosis not present

## 2015-11-05 DIAGNOSIS — M6281 Muscle weakness (generalized): Secondary | ICD-10-CM

## 2015-11-05 DIAGNOSIS — M25561 Pain in right knee: Secondary | ICD-10-CM

## 2015-11-05 DIAGNOSIS — R262 Difficulty in walking, not elsewhere classified: Secondary | ICD-10-CM

## 2015-11-05 NOTE — Therapy (Signed)
[facility] [address], [place], [address] Phone: [phone]   Fax:  [phone]  Physical Therapy Treatment And Discharge Summary  Patient Details  Name: LAQUISHIA GIMLIN MRN: [id number] Date of Birth: [birth year] Referring Provider: Hessie Knows, MD  Encounter Date: 11/05/2015      PT End of Session - 11/05/15 1621    Visit Number 12   Number of Visits 21   Date for PT Re-Evaluation 11/22/15   Authorization Type 3   Authorization Time Period of 10   PT Start Time 1620   PT Stop Time 1706   PT Time Calculation (min) 46 min   Activity Tolerance Patient tolerated treatment well   Behavior During Therapy [facility] for tasks assessed/performed      Past Medical History  Diagnosis Date  . Hypertension   . Herpes simplex   . Fibrocystic disease of breast   . Hyperlipidemia   . Vitamin D deficiency   . Hypothyroidism   . Hypercholesteremia   . GERD (gastroesophageal reflux disease)   . Sleep apnea     no cpap  . Cancer ([place])   . Anemia     h/o     Past Surgical History  Procedure Laterality Date  . Abdominal hysterectomy    . Cholecystectomy    . Dilation and curettage of uterus    . Colonoscopy with propofol N/A 03/22/2015    Procedure: COLONOSCOPY WITH PROPOFOL;  Surgeon: Hulen Luster, MD;  Location: [facility] ENDOSCOPY;  Service: Gastroenterology;  Laterality: N/A;  . Esophagogastroduodenoscopy (egd) with propofol N/A 03/22/2015    Procedure: ESOPHAGOGASTRODUODENOSCOPY (EGD) WITH PROPOFOL;  Surgeon: Hulen Luster, MD;  Location: [facility] ENDOSCOPY;  Service: Gastroenterology;  Laterality: N/A;  . Dg foot heel ([facility] hx)    . Knee arthroscopy Right 08/02/2015    Procedure: ARTHROSCOPY KNEE, PARTIAL MEDIAL MENISECTOMY, PLICA INCISION;  Surgeon: Hessie Knows, MD;  Location: [facility] ORS;  Service: Orthopedics;  Laterality: Right;    There were no vitals filed for this visit.      Subjective Assessment - 11/05/15  1622    Subjective Pt states that she did not have to use her cane or rollator walker this weekend and today and is doing good. 0/10 R knee pain currently. 1/10 R knee pain at worst for the past 3 days. Today might be my last visit.    Pertinent History S/P R medial menisectomy 08/02/2015 secondary to severe knee pain which got to the point that she could not walk. Currently has knee pain with walking. Pt however had one day when she walked without knee pain (3 weeks post op). Still has knee pain and feels like her tendon is going to snap when she stands up and walks.    Patient Stated Goals To be able to walk again normally   Currently in Pain? No/denies   Pain Score 0-No pain  raining and cold outside   Multiple Pain Sites No            [facility] PT Assessment - 11/05/15 1636    AROM   Right Knee Extension -5  sitting   Strength   Right Hip ABduction 4+/5   Left Hip ABduction 4+/5   Right Knee Flexion 5/5   Right Knee Extension 5/5   Left Knee Flexion 5/5   Left Knee Extension 5/5      Objectives   There-ex  NuStep seat 8 Level 1 x 5 min (  no charge)   Gait around gym 500 ft without AD  Seated manually resisted knee flexion, knee extension, S/L hip abduction 1-2x each way for each LE  Reviewed progress/current status with LE strength, R knee AROM, and gait without AD with pt.   Sit <> stand from low mat table with hip adductor pillow squeeze 10x2  Forward wedding march 32 ft x 2   Side stepping 32 ft each direction  Improved exercise technique, movement at target joints, use of target muscles after min to mod verbal, visual, tactile cues.      Manual therapy:   Medial glide to R patella in sitting grade 3 STM to R vastus lateralis   Patient has demonstrated improved bilateral hip and knee strength, ability to ambulate without AD, improved R knee flexion and extension AROM, and overall decreased pain since initial evaluation. Patient has progressed very well towards  goals. Skilled physical therapy services discharged with patient continuing progress with her home exercises.                    PT Education - 11/05/15 1626    Education provided Yes   Education Details ther-ex; progress/current status with PT. POC: discharge   Person(s) Educated Patient   Methods Explanation;Demonstration;Tactile cues;Verbal cues   Comprehension Verbalized understanding;Returned demonstration             PT Long Term Goals - 11/05/15 1627    PT LONG TERM GOAL #1   Title Patient will have a decrease in R knee pain to 5/10 or less at worst to promote ability to stand up from a chair as well as to improve ability to ambulate.    Baseline 7-8/10 when standing up after sitting for one hour or greater   Time 4   Period Weeks   Status Achieved   PT LONG TERM GOAL #2   Title Patient will improve bilateral hip strength and R knee strength by at least 1/2 MMT grade to promote ability to stand up from a chair as well as to improve ability to ambulate.    Time 4   Period Weeks   Status Achieved   PT LONG TERM GOAL #3   Title Patient will be able to ambulate at least 500 ft without AD and no LOB to promote independence with ambulation and mobility.   Baseline Able to ambulate indepenently without LOB or aggravation of knee pain.    Time 4   Period Weeks   Status Achieved   PT LONG TERM GOAL #4   Title Patient will improve R knee extension AROM to 0 degrees to promote knee extension during stance phase of gait.    Baseline Currently -5 degrees R knee extension AROM in sitting   Time 4   Period Weeks   Status On-going   PT LONG TERM GOAL #5   Title Patient will improve R knee flexion AROM to 115 degrees to promote ability to stand up from a chair.    Baseline 115 degrees R knee flexion in sitting.    Time 6   Period Weeks   Status Achieved               Plan - 11/05/15 1627    Clinical Impression Statement Patient has demonstrated improved  bilateral hip and knee strength, ability to ambulate without AD, improved R knee flexion and extension AROM, and overall decreased pain since initial evaluation. Patient has progressed very well towards goals. Skilled physical  therapy services discharged with patient continuing progress with her home exercises.     Rehab Potential Good   Clinical Impairments Affecting Rehab Potential pain, insurance   PT Frequency --   PT Duration --   PT Treatment/Interventions Manual techniques;Therapeutic activities;Therapeutic exercise;Electrical Stimulation;Aquatic Therapy;Gait training;Neuromuscular re-education;Patient/family education;Ultrasound   PT Next Visit Plan continue progress with her exercises at home   Consulted and Agree with Plan of Care Patient      Patient will benefit from skilled therapeutic intervention in order to improve the following deficits and impairments:  Pain, Decreased range of motion, Postural dysfunction, Difficulty walking, Decreased strength, Abnormal gait  Visit Diagnosis: Difficulty in walking, not elsewhere classified  Pain in right knee  Muscle weakness (generalized)       G-Codes - 11/20/2015 1717    Functional Assessment Tool Used Patient interview, clinical presentation   Functional Limitation Mobility: Walking and moving around   Mobility: Walking and Moving Around Goal Status [phone]) At least 20 percent but less than 40 percent impaired, limited or restricted   Mobility: Walking and Moving Around Discharge Status [phone]) At least 20 percent but less than 40 percent impaired, limited or restricted      Problem List There are no active problems to display for this patient.  Thank you for your referral.   Joneen Boers PT, DPT   11/20/2015, 5:25 PM  [facility] [address] [place], [address] Phone: [phone]   Fax:  [phone]  Name: CEILIDH LUKER MRN:  [id number] Date of Birth: Jul 17, 1950

## 2015-12-03 DIAGNOSIS — Z6841 Body Mass Index (BMI) 40.0 and over, adult: Secondary | ICD-10-CM | POA: Insufficient documentation

## 2016-05-30 ENCOUNTER — Encounter: Payer: Self-pay | Admitting: Emergency Medicine

## 2016-05-30 ENCOUNTER — Emergency Department
Admission: EM | Admit: 2016-05-30 | Discharge: 2016-05-31 | Disposition: A | Discharge status: Home / Self Care | Payer: Managed Care, Other (non HMO) | Attending: Emergency Medicine | Admitting: Emergency Medicine

## 2016-05-30 ENCOUNTER — Emergency Department: Payer: Managed Care, Other (non HMO)

## 2016-05-30 DIAGNOSIS — R0602 Shortness of breath: Secondary | ICD-10-CM | POA: Diagnosis not present

## 2016-05-30 DIAGNOSIS — Z79899 Other long term (current) drug therapy: Secondary | ICD-10-CM | POA: Diagnosis not present

## 2016-05-30 DIAGNOSIS — I10 Essential (primary) hypertension: Secondary | ICD-10-CM | POA: Diagnosis not present

## 2016-05-30 DIAGNOSIS — E039 Hypothyroidism, unspecified: Secondary | ICD-10-CM | POA: Diagnosis not present

## 2016-05-30 MED ORDER — ALBUTEROL SULFATE (2.5 MG/3ML) 0.083% IN NEBU
2.5000 mg | INHALATION_SOLUTION | Freq: Once | RESPIRATORY_TRACT | Status: AC
  Administered 2016-05-31: 2.5 mg via RESPIRATORY_TRACT

## 2016-05-30 MED ORDER — ALBUTEROL SULFATE (2.5 MG/3ML) 0.083% IN NEBU
INHALATION_SOLUTION | RESPIRATORY_TRACT | Status: AC
  Administered 2016-05-31: 2.5 mg via RESPIRATORY_TRACT
  Filled 2016-05-30: qty 3

## 2016-05-30 MED ORDER — ALBUTEROL SULFATE (2.5 MG/3ML) 0.083% IN NEBU
INHALATION_SOLUTION | RESPIRATORY_TRACT | Status: AC
  Filled 2016-05-30: qty 3

## 2016-05-30 NOTE — ED Provider Notes (Signed)
[facility] Emergency Department Provider Note   ____________________________________________   First MD Initiated Contact with Patient 05/30/16 2338     (approximate)  I have reviewed the triage vital signs and the nursing notes.   HISTORY  Chief Complaint Shortness of Breath    HPI Dana Morton is a 65 y.o. female who presents to the ED from home with a chief complaint of shortness of breath. Patient reports sudden onset of dyspnea approximately 1 hour ago while at rest. Denies history of lung disease. Denies recent illness. Denies fever, chills, chest pain, cough, abdominal pain, nausea, vomiting, diarrhea. Also complains of generalized weakness. Denies recent travel or trauma. Denies hormone use. Nothing makes her symptoms better. Exertion makes her symptoms worse.   Past Medical History:  Diagnosis Date  . Anemia    h/o   . Fibrocystic disease of breast   . GERD (gastroesophageal reflux disease)   . Herpes simplex   . Hypercholesteremia   . Hyperlipidemia   . Hypertension   . Hypothyroidism   . Sleep apnea    no cpap  . Vitamin D deficiency     There are no active problems to display for this patient.   Past Surgical History:  Procedure Laterality Date  . ABDOMINAL HYSTERECTOMY    . CHOLECYSTECTOMY    . COLONOSCOPY WITH PROPOFOL N/A 03/22/2015   Procedure: COLONOSCOPY WITH PROPOFOL;  Surgeon: Hulen Luster, MD;  Location: [facility] ENDOSCOPY;  Service: Gastroenterology;  Laterality: N/A;  . DG FOOT HEEL ([place] HX)    . DILATION AND CURETTAGE OF UTERUS    . ESOPHAGOGASTRODUODENOSCOPY (EGD) WITH PROPOFOL N/A 03/22/2015   Procedure: ESOPHAGOGASTRODUODENOSCOPY (EGD) WITH PROPOFOL;  Surgeon: Hulen Luster, MD;  Location: [facility] ENDOSCOPY;  Service: Gastroenterology;  Laterality: N/A;  . KNEE ARTHROSCOPY Right 08/02/2015   Procedure: ARTHROSCOPY KNEE, PARTIAL MEDIAL MENISECTOMY, PLICA INCISION;  Surgeon: Hessie Knows, MD;  Location: [facility] ORS;  Service:  Orthopedics;  Laterality: Right;    Prior to Admission medications   Medication Sig Start Date End Date Taking? Authorizing Provider  albuterol (PROVENTIL HFA;VENTOLIN HFA) 108 (90 Base) MCG/ACT inhaler Inhale 2 puffs into the lungs every 4 (four) hours as needed for wheezing or shortness of breath. 05/31/16   Paulette Blanch, MD  bisoprolol (ZEBETA) 5 MG tablet Take 5 mg by mouth every morning.     Historical Provider, MD  BLACK COHOSH PO Take by mouth.    Historical Provider, MD  calcium carbonate (OS-CAL) 600 MG TABS tablet Take 600 mg by mouth 2 (two) times daily with a meal.    Historical Provider, MD  Cetirizine HCl (ZYRTEC ALLERGY) 10 MG CAPS Take 1 capsule by mouth daily.     Historical Provider, MD  cholecalciferol (VITAMIN D) 400 UNITS TABS tablet Take 400 Units by mouth.    Historical Provider, MD  gabapentin (NEURONTIN) 300 MG capsule Take 300 mg by mouth 2 (two) times daily.    Historical Provider, MD  hydrochlorothiazide (HYDRODIURIL) 25 MG tablet Take 25 mg by mouth daily.    Historical Provider, MD  HYDROcodone-acetaminophen (NORCO) 5-325 MG tablet Take 1 tablet by mouth every 6 (six) hours as needed for moderate pain. 08/02/15   Hessie Knows, MD  HYDROcodone-acetaminophen (NORCO/VICODIN) 5-325 MG tablet Take 2 tablets by mouth daily.    Historical Provider, MD  ibuprofen (ADVIL,MOTRIN) 200 MG tablet Take 800 mg by mouth at bedtime.     Historical Provider, MD  levothyroxine (SYNTHROID, LEVOTHROID) 125 MCG  tablet Take 125 mcg by mouth daily before breakfast.    Historical Provider, MD  lisinopril (PRINIVIL,ZESTRIL) 40 MG tablet Take 40 mg by mouth 2 (two) times daily.     Historical Provider, MD  lovastatin (MEVACOR) 40 MG tablet Take 40 mg by mouth at bedtime.    Historical Provider, MD  Multiple Vitamins-Minerals (CENTRUM SILVER PO) Take by mouth.    Historical Provider, MD  omeprazole (PRILOSEC) 20 MG capsule Take 20 mg by mouth every morning.     Historical Provider, MD     Allergies Codeine  No family history on file.  Social History Social History  Substance Use Topics  . Smoking status: Never Smoker  . Smokeless tobacco: Never Used  . Alcohol use No    Review of Systems  Constitutional: No fever/chills. Eyes: No visual changes. ENT: No sore throat. Cardiovascular: Denies chest pain. Respiratory: Positive for shortness of breath. Gastrointestinal: No abdominal pain.  No nausea, no vomiting.  No diarrhea.  No constipation. Genitourinary: Negative for dysuria. Musculoskeletal: Negative for back pain. Skin: Negative for rash. Neurological: Negative for headaches, focal weakness or numbness.  10-point ROS otherwise negative.  ____________________________________________   PHYSICAL EXAM:  VITAL SIGNS: ED Triage Vitals  Enc Vitals Group     BP      Pulse      Resp      Temp      Temp src      SpO2      Weight      Height      Head Circumference      Peak Flow      Pain Score      Pain Loc      Pain Edu?      Excl. in [place]?     Constitutional: Alert and oriented. Well appearing and in mild acute distress. Anxious. Eyes: Conjunctivae are normal. PERRL. EOMI. Head: Atraumatic. Nose: No congestion/rhinnorhea. Mouth/Throat: Mucous membranes are moist.  Oropharynx non-erythematous. Neck: No stridor.   Cardiovascular: Normal rate, regular rhythm. Grossly normal heart sounds.  Good peripheral circulation. Respiratory: Person lip breathing. Increased respiratory effort.  No retractions. Lungs diminished bibasilarly; rhonchi heard at left lower lobe. Gastrointestinal: Obese. Soft and nontender. No distention. No abdominal bruits. No CVA tenderness. Musculoskeletal: No lower extremity tenderness. Baseline 1+ nonpitting edema.  No joint effusions. Neurologic:  Normal speech and language. No gross focal neurologic deficits are appreciated.  Skin:  Skin is warm, clammy and intact. No rash noted. Psychiatric: Mood and affect are normal.  Speech and behavior are normal.  ____________________________________________   LABS (all labs ordered are listed, but only abnormal results are displayed)  Labs Reviewed  CBC WITH DIFFERENTIAL/PLATELET - Abnormal; Notable for the following:       Result Value   WBC 15.4 (*)    Lymphs Abs 8.0 (*)    Monocytes Absolute 1.1 (*)    All other components within normal limits  COMPREHENSIVE METABOLIC PANEL - Abnormal; Notable for the following:    Potassium 3.4 (*)    Glucose, Bld 111 (*)    BUN 21 (*)    Creatinine, Ser 1.09 (*)    GFR calc non Af Amer 51 (*)    GFR calc Af Amer 59 (*)    All other components within normal limits  BRAIN NATRIURETIC PEPTIDE  TROPONIN I  TROPONIN I   ____________________________________________  EKG  ED ECG REPORT I, Paelyn Smick J, the attending physician, personally viewed and interpreted this ECG.  Date: 05/30/2016  EKG Time: 2337  Rate: 62  Rhythm: normal EKG, normal sinus rhythm  Axis: Normal  Intervals:none  ST&T Change: Nonspecific  ____________________________________________  RADIOLOGY  Portable chest x-ray (viewed by me, interpreted per Dr. Randel Pigg): Upper lobe predominant emphysematous hyperinflation of the lungs.  CT Chest interpreted per Dr. Radene Knee: 1. No evidence of significant pulmonary embolus.  2. Tiny hiatal hernia noted.  3. Minimal bibasilar atelectasis or scarring noted. Mild scarring at  the lung apices.   ____________________________________________   PROCEDURES  Procedure(s) performed: None  Procedures  Critical Care performed: No  ____________________________________________   INITIAL IMPRESSION / ASSESSMENT AND PLAN / ED COURSE  Pertinent labs & imaging results that were available during my care of the patient were reviewed by me and considered in my medical decision making (see chart for details).  65 year old female without prior history of lung disease nor CHF who presents with acute onset of  shortness of breath. Patient is mildly tachypneic, hypertensive without chest pain, not hypoxic. Will initiate albuterol neb to improve aeration; obtain screening lab work, chest x-ray and reassess.  Clinical Course as of Jun 01 735  Sat May 31, 2016  0111 Patient feeling somewhat improved after nebulizer treatment. Still appears anxious. Chest x-ray unremarkable. Will proceed with CT chest to evaluate for pulmonary embolism. Low dose Ativan given for anxiety.  [JS]  [id number] Patient ambulated with a pulse oximeter and remained at 95% or above. Awaiting repeat troponin level.  [JS]  [id number] Updated patient and spouse on repeat negative troponin. Patient is resting comfortably in no acute distress. Will discharge home with prescription for albuterol inhaler to use as needed. Strict return precautions given. Patient and spouse verbalized understanding and agree with plan of care.  [JS]    Clinical Course User Index [JS] Paulette Blanch, MD     ____________________________________________   FINAL CLINICAL IMPRESSION(S) / ED DIAGNOSES  Final diagnoses:  Shortness of breath      NEW MEDICATIONS STARTED DURING THIS VISIT:  Discharge Medication List as of 05/31/2016  4:18 AM    START taking these medications   Details  albuterol (PROVENTIL HFA;VENTOLIN HFA) 108 (90 Base) MCG/ACT inhaler Inhale 2 puffs into the lungs every 4 (four) hours as needed for wheezing or shortness of breath., Starting Sat 05/31/2016, Print         Note:  This document was prepared using Dragon voice recognition software and may include unintentional dictation errors.    Paulette Blanch, MD 05/31/16 [phone]

## 2016-05-30 NOTE — ED Triage Notes (Signed)
Pt presents w/ sudden onset of dyspnea w/o prior exertion. Pt denies pain, cardiac and respiratory illness hx. Pt denies vomiting. Pt c/o weakness and required assistance w/ transfer from [facility] to stretcher.

## 2016-05-31 ENCOUNTER — Emergency Department: Payer: Managed Care, Other (non HMO)

## 2016-05-31 DIAGNOSIS — R0602 Shortness of breath: Secondary | ICD-10-CM | POA: Diagnosis not present

## 2016-05-31 LAB — COMPREHENSIVE METABOLIC PANEL
ALT: 20 U/L (ref 14–54)
AST: 31 U/L (ref 15–41)
Albumin: 4.4 g/dL (ref 3.5–5.0)
Alkaline Phosphatase: 68 U/L (ref 38–126)
Anion gap: 8 (ref 5–15)
BUN: 21 mg/dL — ABNORMAL HIGH (ref 6–20)
CO2: 27 mmol/L (ref 22–32)
Calcium: 9.1 mg/dL (ref 8.9–10.3)
Chloride: 102 mmol/L (ref 101–111)
Creatinine, Ser: 1.09 mg/dL — ABNORMAL HIGH (ref 0.44–1.00)
GFR calc Af Amer: 59 mL/min — ABNORMAL LOW (ref >60)
GFR calc non Af Amer: 51 mL/min — ABNORMAL LOW (ref >60)
Glucose, Bld: 111 mg/dL — ABNORMAL HIGH (ref 65–99)
Potassium: 3.4 mmol/L — ABNORMAL LOW (ref 3.5–5.1)
Sodium: 137 mmol/L (ref 135–145)
Total Bilirubin: 0.4 mg/dL (ref 0.3–1.2)
Total Protein: 7.6 g/dL (ref 6.5–8.1)

## 2016-05-31 LAB — CBC WITH DIFFERENTIAL/PLATELET
Basophils Absolute: 0.1 10*3/uL (ref 0–0.1)
Basophils Relative: 0 %
Eosinophils Absolute: 0.4 10*3/uL (ref 0–0.7)
Eosinophils Relative: 2 %
HCT: 39.6 % (ref 35.0–47.0)
Hemoglobin: 13.5 g/dL (ref 12.0–16.0)
Lymphocytes Relative: 53 %
Lymphs Abs: 8 10*3/uL — ABNORMAL HIGH (ref 1.0–3.6)
MCH: 32.3 pg (ref 26.0–34.0)
MCHC: 34.2 g/dL (ref 32.0–36.0)
MCV: 94.5 fL (ref 80.0–100.0)
Monocytes Absolute: 1.1 10*3/uL — ABNORMAL HIGH (ref 0.2–0.9)
Monocytes Relative: 7 %
Neutro Abs: 5.9 10*3/uL (ref 1.4–6.5)
Neutrophils Relative %: 38 %
Platelets: 239 10*3/uL (ref 150–440)
RBC: 4.19 MIL/uL (ref 3.80–5.20)
RDW: 13.9 % (ref 11.5–14.5)
WBC: 15.4 10*3/uL — ABNORMAL HIGH (ref 3.6–11.0)

## 2016-05-31 LAB — TROPONIN I
Troponin I: <0.03 ng/mL (ref <0.03)
Troponin I: <0.03 ng/mL (ref <0.03)

## 2016-05-31 LAB — BRAIN NATRIURETIC PEPTIDE: B Natriuretic Peptide: 32 pg/mL (ref 0.0–100.0)

## 2016-05-31 MED ORDER — ALBUTEROL SULFATE HFA 108 (90 BASE) MCG/ACT IN AERS: 2.0000 | INHALATION_SPRAY | RESPIRATORY_TRACT | 0 refills | Status: DC | PRN

## 2016-05-31 MED ORDER — LORAZEPAM 2 MG/ML IJ SOLN
0.5000 mg | Freq: Once | INTRAMUSCULAR | Status: AC
  Administered 2016-05-31: 0.5 mg via INTRAVENOUS
  Filled 2016-05-31: qty 1

## 2016-05-31 MED ORDER — IOPAMIDOL (ISOVUE-370) INJECTION 76%
75.0000 mL | Freq: Once | INTRAVENOUS | Status: AC | PRN
  Administered 2016-05-31: 75 mL via INTRAVENOUS

## 2016-05-31 NOTE — ED Notes (Signed)
Pt. States when she was getting ready for bed tonight around 11 pm pt. States she was feeling weak.  Pt. States when she got into bed, pt. States feeling pressure in chest.  Pt. Denies cardiac or respiratory hx.

## 2016-05-31 NOTE — ED Notes (Signed)
Pt ambulated in room with pulse ox, sp02 remained at 95%

## 2016-05-31 NOTE — Discharge Instructions (Signed)
1.  Use albuterol inhaler 2 puffs every 4 hours as needed for difficulty breathing. 2.  Return to the ER for worsening symptoms, persistent vomiting, difficulty breathing or other concerns. 

## 2016-10-15 ENCOUNTER — Other Ambulatory Visit: Payer: Self-pay | Admitting: Neurosurgery

## 2016-10-15 DIAGNOSIS — M4310 Spondylolisthesis, site unspecified: Secondary | ICD-10-CM

## 2016-10-22 ENCOUNTER — Ambulatory Visit
Admission: RE | Admit: 2016-10-22 | Discharge: 2016-10-22 | Disposition: A | Discharge status: Home / Self Care | Payer: Managed Care, Other (non HMO) | Source: Ambulatory Visit | Attending: Neurosurgery | Admitting: Neurosurgery

## 2016-10-22 DIAGNOSIS — M4310 Spondylolisthesis, site unspecified: Secondary | ICD-10-CM

## 2016-10-22 MED ORDER — ONDANSETRON HCL 4 MG/2ML IJ SOLN: 4.0000 mg | Freq: Four times a day (QID) | INTRAMUSCULAR | Status: DC | PRN

## 2016-10-22 MED ORDER — IOPAMIDOL (ISOVUE-M 200) INJECTION 41%
15.0000 mL | Freq: Once | INTRAMUSCULAR | Status: AC
  Administered 2016-10-22: 15 mL via INTRATHECAL

## 2016-10-22 MED ORDER — DIAZEPAM 5 MG PO TABS
5.0000 mg | ORAL_TABLET | Freq: Once | ORAL | Status: AC
  Administered 2016-10-22: 5 mg via ORAL

## 2016-10-22 NOTE — Discharge Instructions (Signed)

## 2016-10-31 ENCOUNTER — Other Ambulatory Visit: Payer: Self-pay | Admitting: Neurosurgery

## 2016-10-31 DIAGNOSIS — M4310 Spondylolisthesis, site unspecified: Secondary | ICD-10-CM

## 2016-11-05 ENCOUNTER — Ambulatory Visit
Admission: RE | Admit: 2016-11-05 | Discharge: 2016-11-05 | Disposition: A | Discharge status: Home / Self Care | Payer: Managed Care, Other (non HMO) | Source: Ambulatory Visit | Attending: Neurosurgery | Admitting: Neurosurgery

## 2016-11-05 VITALS — BP 145/62 | HR 66

## 2016-11-05 DIAGNOSIS — M4310 Spondylolisthesis, site unspecified: Secondary | ICD-10-CM

## 2016-11-05 MED ORDER — METHYLPREDNISOLONE ACETATE 40 MG/ML INJ SUSP (RADIOLOG
120.0000 mg | Freq: Once | INTRAMUSCULAR | Status: AC
  Administered 2016-11-05: 120 mg via EPIDURAL

## 2016-11-05 MED ORDER — DIAZEPAM 5 MG PO TABS
5.0000 mg | ORAL_TABLET | Freq: Once | ORAL | Status: AC
  Administered 2016-11-05: 5 mg via ORAL

## 2016-11-05 MED ORDER — IOPAMIDOL (ISOVUE-M 200) INJECTION 41%
1.0000 mL | Freq: Once | INTRAMUSCULAR | Status: AC
  Administered 2016-11-05: 1 mL via EPIDURAL

## 2016-11-05 NOTE — Discharge Instructions (Signed)

## 2016-12-05 ENCOUNTER — Other Ambulatory Visit: Payer: Self-pay | Admitting: Neurosurgery

## 2016-12-05 DIAGNOSIS — M4310 Spondylolisthesis, site unspecified: Secondary | ICD-10-CM

## 2016-12-11 ENCOUNTER — Other Ambulatory Visit: Payer: Self-pay | Admitting: Internal Medicine

## 2016-12-11 DIAGNOSIS — Z1231 Encounter for screening mammogram for malignant neoplasm of breast: Secondary | ICD-10-CM

## 2016-12-16 ENCOUNTER — Ambulatory Visit
Admission: RE | Admit: 2016-12-16 | Discharge: 2016-12-16 | Disposition: A | Discharge status: Home / Self Care | Payer: Managed Care, Other (non HMO) | Source: Ambulatory Visit | Attending: Internal Medicine | Admitting: Internal Medicine

## 2016-12-16 DIAGNOSIS — Z1231 Encounter for screening mammogram for malignant neoplasm of breast: Secondary | ICD-10-CM | POA: Insufficient documentation

## 2017-01-01 ENCOUNTER — Ambulatory Visit
Admission: RE | Admit: 2017-01-01 | Discharge: 2017-01-01 | Disposition: A | Discharge status: Home / Self Care | Payer: Managed Care, Other (non HMO) | Source: Ambulatory Visit | Attending: Neurosurgery | Admitting: Neurosurgery

## 2017-01-01 DIAGNOSIS — M4310 Spondylolisthesis, site unspecified: Secondary | ICD-10-CM

## 2017-01-01 MED ORDER — METHYLPREDNISOLONE ACETATE 40 MG/ML INJ SUSP (RADIOLOG
120.0000 mg | Freq: Once | INTRAMUSCULAR | Status: AC
  Administered 2017-01-01: 120 mg via EPIDURAL

## 2017-01-01 MED ORDER — DIAZEPAM 5 MG PO TABS
5.0000 mg | ORAL_TABLET | Freq: Once | ORAL | Status: AC
  Administered 2017-01-01: 5 mg via ORAL

## 2017-01-01 MED ORDER — IOPAMIDOL (ISOVUE-M 200) INJECTION 41%
1.0000 mL | Freq: Once | INTRAMUSCULAR | Status: AC
  Administered 2017-01-01: 1 mL via EPIDURAL

## 2017-01-01 NOTE — Discharge Instructions (Signed)

## 2017-01-16 ENCOUNTER — Emergency Department
Admission: EM | Admit: 2017-01-16 | Discharge: 2017-01-16 | Disposition: A | Discharge status: Home / Self Care | Payer: Managed Care, Other (non HMO) | Attending: Emergency Medicine | Admitting: Emergency Medicine

## 2017-01-16 ENCOUNTER — Encounter: Payer: Self-pay | Admitting: Medical Oncology

## 2017-01-16 DIAGNOSIS — I10 Essential (primary) hypertension: Secondary | ICD-10-CM | POA: Insufficient documentation

## 2017-01-16 DIAGNOSIS — Z79899 Other long term (current) drug therapy: Secondary | ICD-10-CM | POA: Diagnosis not present

## 2017-01-16 DIAGNOSIS — R531 Weakness: Secondary | ICD-10-CM | POA: Insufficient documentation

## 2017-01-16 DIAGNOSIS — E039 Hypothyroidism, unspecified: Secondary | ICD-10-CM | POA: Diagnosis not present

## 2017-01-16 LAB — URINALYSIS, COMPLETE (UACMP) WITH MICROSCOPIC
Bilirubin Urine: NEGATIVE
Glucose, UA: NEGATIVE mg/dL
Hgb urine dipstick: NEGATIVE
Ketones, ur: NEGATIVE mg/dL
Nitrite: NEGATIVE
Protein, ur: NEGATIVE mg/dL
Specific Gravity, Urine: 1.021 (ref 1.005–1.030)
pH: 5 (ref 5.0–8.0)

## 2017-01-16 LAB — COMPREHENSIVE METABOLIC PANEL
ALT: 35 U/L (ref 14–54)
AST: 34 U/L (ref 15–41)
Albumin: 4 g/dL (ref 3.5–5.0)
Alkaline Phosphatase: 71 U/L (ref 38–126)
Anion gap: 10 (ref 5–15)
BUN: 41 mg/dL — ABNORMAL HIGH (ref 6–20)
CO2: 28 mmol/L (ref 22–32)
Calcium: 9.3 mg/dL (ref 8.9–10.3)
Chloride: 99 mmol/L — ABNORMAL LOW (ref 101–111)
Creatinine, Ser: 1.67 mg/dL — ABNORMAL HIGH (ref 0.44–1.00)
GFR calc Af Amer: 36 mL/min — ABNORMAL LOW (ref >60)
GFR calc non Af Amer: 31 mL/min — ABNORMAL LOW (ref >60)
Glucose, Bld: 140 mg/dL — ABNORMAL HIGH (ref 65–99)
Potassium: 4.1 mmol/L (ref 3.5–5.1)
Sodium: 137 mmol/L (ref 135–145)
Total Bilirubin: 0.7 mg/dL (ref 0.3–1.2)
Total Protein: 7.6 g/dL (ref 6.5–8.1)

## 2017-01-16 LAB — CBC
HCT: 41.2 % (ref 35.0–47.0)
Hemoglobin: 14.1 g/dL (ref 12.0–16.0)
MCH: 32.9 pg (ref 26.0–34.0)
MCHC: 34.3 g/dL (ref 32.0–36.0)
MCV: 96.1 fL (ref 80.0–100.0)
Platelets: 241 10*3/uL (ref 150–440)
RBC: 4.29 MIL/uL (ref 3.80–5.20)
RDW: 14.2 % (ref 11.5–14.5)
WBC: 11.2 10*3/uL — ABNORMAL HIGH (ref 3.6–11.0)

## 2017-01-16 LAB — TROPONIN I: Troponin I: <0.03 ng/mL (ref <0.03)

## 2017-01-16 NOTE — ED Triage Notes (Signed)
Pt reports that for the past 30 mins she has been feeling really weak all over. Pts husband checked pts bp at home and noted it to be 86/54. Pt denies pain. Pt reports similar sx's occurred 3-4 days ago too.

## 2017-01-16 NOTE — Discharge Instructions (Signed)
As we discussed starting tomorrow please take 2.5 mg instead of 5 mg of your bisoprolol.  Please follow-up with your primary care doctor soon as possible. Please call the number provided for cardiology to arrange a follow-up appointment next week. Return to the emergency department for any further concerning issues, or for any chest pain or trouble breathing, or any other symptom personally concerning to yourself.

## 2017-01-16 NOTE — ED Provider Notes (Signed)
[facility] Emergency Department Provider Note  Time seen: 11:31 AM  I have reviewed the triage vital signs and the nursing notes.   HISTORY  Chief Complaint Hypotension and Weakness    HPI Dana Morton is a 66 y.o. female With a past medical history of gastric reflux, hypertension, hyperlipidemia, presents the emergency departmentfor episodes of weakness. According to the patient over the past 2-3 weeks she has been expressing episodes of weakness. She describes these episodes as brief lasting seconds to sometimes minutes. During these episodes she states she will occasionally get nauseated or diaphoretic. Denies any chest pain or shortness of breath. Denies abdominal pain. Denies dysuria, nausea vomiting diarrhea, black or bloody stool. Patient states she has a history of panic attacks but states this feels somewhat different to her. She was at home today when one of these episodes occurred and she used her husband's blood pressure cuff and got a reading in the 80s so she came to the emergency department for evaluation. Patient does currently take 3 blood pressure medications per patient. Denies any recent changes in these medications.  Past Medical History:  Diagnosis Date  . Anemia    h/o   . Fibrocystic disease of breast   . GERD (gastroesophageal reflux disease)   . Herpes simplex   . Hypercholesteremia   . Hyperlipidemia   . Hypertension   . Hypothyroidism   . Sleep apnea    no cpap  . Vitamin D deficiency     There are no active problems to display for this patient.   Past Surgical History:  Procedure Laterality Date  . ABDOMINAL HYSTERECTOMY    . BREAST EXCISIONAL BIOPSY Left    long ago  . CHOLECYSTECTOMY    . COLONOSCOPY WITH PROPOFOL N/A 03/22/2015   Procedure: COLONOSCOPY WITH PROPOFOL;  Surgeon: Hulen Luster, MD;  Location: [facility] ENDOSCOPY;  Service: Gastroenterology;  Laterality: N/A;  . DG FOOT HEEL ([place] HX)    . DILATION AND  CURETTAGE OF UTERUS    . ESOPHAGOGASTRODUODENOSCOPY (EGD) WITH PROPOFOL N/A 03/22/2015   Procedure: ESOPHAGOGASTRODUODENOSCOPY (EGD) WITH PROPOFOL;  Surgeon: Hulen Luster, MD;  Location: [facility] ENDOSCOPY;  Service: Gastroenterology;  Laterality: N/A;  . KNEE ARTHROSCOPY Right 08/02/2015   Procedure: ARTHROSCOPY KNEE, PARTIAL MEDIAL MENISECTOMY, PLICA INCISION;  Surgeon: Hessie Knows, MD;  Location: [facility] ORS;  Service: Orthopedics;  Laterality: Right;    Prior to Admission medications   Medication Sig Start Date End Date Taking? Authorizing Provider  bisoprolol (ZEBETA) 5 MG tablet Take 5 mg by mouth every morning.     [provider]  BLACK COHOSH PO Take by mouth.    [provider]  calcium carbonate (OS-CAL) 600 MG TABS tablet Take 600 mg by mouth 2 (two) times daily with a meal.    [provider]  Cetirizine HCl (ZYRTEC ALLERGY) 10 MG CAPS Take 1 capsule by mouth daily.     [provider]  cholecalciferol (VITAMIN D) 400 UNITS TABS tablet Take 400 Units by mouth.    [provider]  gabapentin (NEURONTIN) 300 MG capsule Take 300 mg by mouth 2 (two) times daily.    [provider]  hydrochlorothiazide (HYDRODIURIL) 25 MG tablet Take 25 mg by mouth daily.    [provider]  HYDROcodone-acetaminophen (NORCO) 5-325 MG tablet Take 1 tablet by mouth every 6 (six) hours as needed for moderate pain. 08/02/15   Hessie Knows, MD  HYDROcodone-acetaminophen (NORCO/VICODIN) 5-325 MG tablet Take 2  tablets by mouth daily.    [provider]  ibuprofen (ADVIL,MOTRIN) 200 MG tablet Take 800 mg by mouth at bedtime.     [provider]  levothyroxine (SYNTHROID, LEVOTHROID) 125 MCG tablet Take 125 mcg by mouth daily before breakfast.    [provider]  lisinopril (PRINIVIL,ZESTRIL) 40 MG tablet Take 40 mg by mouth 2 (two) times daily.     [provider]  lovastatin (MEVACOR) 40 MG tablet Take 40 mg by mouth at  bedtime.    [provider]  Multiple Vitamins-Minerals (CENTRUM SILVER PO) Take by mouth.    [provider]  omeprazole (PRILOSEC) 20 MG capsule Take 20 mg by mouth every morning.     [provider]    Allergies  Allergen Reactions  . Celebrex [Celecoxib] Nausea Only  . Codeine Nausea Only    No family history on file.  Social History Social History  Substance Use Topics  . Smoking status: Never Smoker  . Smokeless tobacco: Never Used  . Alcohol use No    Review of Systems Constitutional: Negative for fever. Cardiovascular: Negative for chest pain. Respiratory: Negative for shortness of breath. Gastrointestinal: Negative for abdominal pain Genitourinary: Negative for dysuria. Musculoskeletal: Negative for back pain Neurological: Negative for headaches, focal weakness or numbness. Generalized weakness at times. All other ROS negative  ____________________________________________   PHYSICAL EXAM:  VITAL SIGNS: ED Triage Vitals  Enc Vitals Group     BP 01/16/17 1101 (!) 113/44     Pulse Rate 01/16/17 1101 66     Resp 01/16/17 1101 20     Temp 01/16/17 1101 97.6 F (36.4 C)     Temp Source 01/16/17 1101 Oral     SpO2 01/16/17 1101 95 %     Weight 01/16/17 1059 270 lb (122.5 kg)     Height 01/16/17 1059 5\' 6"  (1.676 m)     Head Circumference --      Peak Flow --      Pain Score --      Pain Loc --      Pain Edu? --      Excl. in [place]? --     Constitutional: Alert and oriented. Well appearing and in no distress. Eyes: Normal exam ENT   Head: Normocephalic and atraumatic.   Mouth/Throat: Mucous membranes are moist. Cardiovascular: Normal rate, regular rhythm. No murmur Respiratory: Normal respiratory effort without tachypnea nor retractions. Breath sounds are clear Gastrointestinal: Soft and nontender. No distention.   Musculoskeletal: Nontender with normal range of motion in all extremities.  Neurologic:  Normal speech and  language. No gross focal neurologic deficits  Skin:  Skin is warm, dry and intact.  Psychiatric: Mood and affect are normal.   ____________________________________________    EKG  EKG reviewed and interpreted by myself shows normal sinus rhythm at 60 bpm, narrow QRS, normal axis, normal intervals, no concerning ST changes.  ____________________________________________   INITIAL IMPRESSION / ASSESSMENT AND PLAN / ED COURSE  Pertinent labs & imaging results that were available during my care of the patient were reviewed by me and considered in my medical decision making (see chart for details).  The patient presents the emergency department for episodes of weakness. She states the episodes are brief lasting seconds to sometimes up weight minute. Denies any chest pain or shortness of breath. Largely negative review of systems. We will check labs including cardiac enzymes. EKG is reassuring. Currently the patient appears well her heart rate is around  60 bpm and blood pressure 438 systolic. We will continue to closely monitor the patient. Pulse ox recordederroneous at 68%, during my evaluation this was 98-100 percent on room air. In reviewing the patient's medications she appears to take lisinopril, hydrochlorothiazide as well as a beta blocker. Given the patient's symptoms I believe she would benefit from discontinuation of her beta blocker until could be seen by her primary care doctor.  Patient's lab work is largely at baseline, besides slight renal insufficiency. Troponin negative. Patient's blood pressure currently 887 systolic heart rate remains around 60 bpm. I discussed with the patient starting a half dose of the beta blocker and following up with cardiologist for further workup. Patient agreeable.  ____________________________________________   FINAL CLINICAL IMPRESSION(S) / ED DIAGNOSES  Weakness    Harvest Dark, MD 01/16/17 1319

## 2017-02-23 ENCOUNTER — Other Ambulatory Visit: Payer: Self-pay | Admitting: Neurosurgery

## 2017-02-23 DIAGNOSIS — M4310 Spondylolisthesis, site unspecified: Secondary | ICD-10-CM

## 2017-03-03 DIAGNOSIS — R600 Localized edema: Secondary | ICD-10-CM | POA: Insufficient documentation

## 2017-03-05 ENCOUNTER — Ambulatory Visit
Admission: RE | Admit: 2017-03-05 | Discharge: 2017-03-05 | Disposition: A | Discharge status: Home / Self Care | Payer: Managed Care, Other (non HMO) | Source: Ambulatory Visit | Attending: Neurosurgery | Admitting: Neurosurgery

## 2017-03-05 DIAGNOSIS — M4310 Spondylolisthesis, site unspecified: Secondary | ICD-10-CM

## 2017-03-05 MED ORDER — IOPAMIDOL (ISOVUE-M 200) INJECTION 41%
1.0000 mL | Freq: Once | INTRAMUSCULAR | Status: AC
  Administered 2017-03-05: 1 mL via EPIDURAL

## 2017-03-05 MED ORDER — DIAZEPAM 5 MG PO TABS
5.0000 mg | ORAL_TABLET | Freq: Once | ORAL | Status: AC
  Administered 2017-03-05: 5 mg via ORAL

## 2017-03-05 MED ORDER — METHYLPREDNISOLONE ACETATE 40 MG/ML INJ SUSP (RADIOLOG
120.0000 mg | Freq: Once | INTRAMUSCULAR | Status: AC
  Administered 2017-03-05: 120 mg via EPIDURAL

## 2017-03-05 NOTE — Discharge Instructions (Signed)

## 2017-04-24 ENCOUNTER — Other Ambulatory Visit: Payer: Self-pay | Admitting: Neurosurgery

## 2017-04-24 DIAGNOSIS — M4310 Spondylolisthesis, site unspecified: Secondary | ICD-10-CM

## 2017-05-21 ENCOUNTER — Other Ambulatory Visit: Payer: BLUE CROSS/BLUE SHIELD

## 2017-07-27 ENCOUNTER — Ambulatory Visit
Admission: RE | Admit: 2017-07-27 | Discharge: 2017-07-27 | Disposition: A | Discharge status: Home / Self Care | Payer: BLUE CROSS/BLUE SHIELD | Source: Ambulatory Visit | Attending: Neurosurgery | Admitting: Neurosurgery

## 2017-07-27 DIAGNOSIS — M4310 Spondylolisthesis, site unspecified: Secondary | ICD-10-CM

## 2017-07-27 MED ORDER — IOPAMIDOL (ISOVUE-M 200) INJECTION 41%
1.0000 mL | Freq: Once | INTRAMUSCULAR | Status: AC
  Administered 2017-07-27: 1 mL via EPIDURAL

## 2017-07-27 MED ORDER — DIAZEPAM 5 MG PO TABS
5.0000 mg | ORAL_TABLET | Freq: Once | ORAL | Status: AC
  Administered 2017-07-27: 5 mg via ORAL

## 2017-07-27 MED ORDER — METHYLPREDNISOLONE ACETATE 40 MG/ML INJ SUSP (RADIOLOG
120.0000 mg | Freq: Once | INTRAMUSCULAR | Status: AC
  Administered 2017-07-27: 120 mg via EPIDURAL

## 2017-07-27 NOTE — Discharge Instructions (Signed)

## 2017-09-09 ENCOUNTER — Encounter
Admission: RE | Admit: 2017-09-09 | Discharge: 2017-09-09 | Disposition: A | Discharge status: Home / Self Care | Payer: BLUE CROSS/BLUE SHIELD | Source: Ambulatory Visit | Attending: Orthopedic Surgery | Admitting: Orthopedic Surgery

## 2017-09-09 ENCOUNTER — Other Ambulatory Visit: Payer: Self-pay

## 2017-09-09 DIAGNOSIS — Z01812 Encounter for preprocedural laboratory examination: Secondary | ICD-10-CM | POA: Diagnosis not present

## 2017-09-09 DIAGNOSIS — Z01810 Encounter for preprocedural cardiovascular examination: Secondary | ICD-10-CM | POA: Insufficient documentation

## 2017-09-09 DIAGNOSIS — E119 Type 2 diabetes mellitus without complications: Secondary | ICD-10-CM | POA: Insufficient documentation

## 2017-09-09 HISTORY — DX: Pure hypercholesterolemia, unspecified: E78.00

## 2017-09-09 HISTORY — DX: Claustrophobia: F40.240

## 2017-09-09 HISTORY — DX: Diffuse cystic mastopathy of unspecified breast: N60.19

## 2017-09-09 HISTORY — DX: Unspecified rotator cuff tear or rupture of unspecified shoulder, not specified as traumatic: M75.100

## 2017-09-09 HISTORY — DX: Prediabetes: R73.03

## 2017-09-09 HISTORY — DX: Hyperlipidemia, unspecified: E78.5

## 2017-09-09 HISTORY — DX: Diverticulosis of intestine, part unspecified, without perforation or abscess without bleeding: K57.90

## 2017-09-09 HISTORY — DX: Other nonspecific abnormal finding of lung field: R91.8

## 2017-09-09 HISTORY — DX: Unspecified osteoarthritis, unspecified site: M19.90

## 2017-09-09 LAB — URINALYSIS, ROUTINE W REFLEX MICROSCOPIC
Bilirubin Urine: NEGATIVE
Glucose, UA: NEGATIVE mg/dL
Hgb urine dipstick: NEGATIVE
Ketones, ur: NEGATIVE mg/dL
Leukocytes, UA: NEGATIVE
Nitrite: NEGATIVE
Protein, ur: NEGATIVE mg/dL
Specific Gravity, Urine: 1.021 (ref 1.005–1.030)
pH: 5 (ref 5.0–8.0)

## 2017-09-09 LAB — C-REACTIVE PROTEIN: CRP: 1.2 mg/dL — ABNORMAL HIGH (ref <1.0)

## 2017-09-09 LAB — CBC
HCT: 41.7 % (ref 35.0–47.0)
Hemoglobin: 13.8 g/dL (ref 12.0–16.0)
MCH: 32.2 pg (ref 26.0–34.0)
MCHC: 33 g/dL (ref 32.0–36.0)
MCV: 97.3 fL (ref 80.0–100.0)
Platelets: 226 10*3/uL (ref 150–440)
RBC: 4.29 MIL/uL (ref 3.80–5.20)
RDW: 13.6 % (ref 11.5–14.5)
WBC: 7.8 10*3/uL (ref 3.6–11.0)

## 2017-09-09 LAB — APTT: aPTT: 29 seconds (ref 24–36)

## 2017-09-09 LAB — COMPREHENSIVE METABOLIC PANEL
ALT: 28 U/L (ref 14–54)
AST: 33 U/L (ref 15–41)
Albumin: 4.1 g/dL (ref 3.5–5.0)
Alkaline Phosphatase: 71 U/L (ref 38–126)
Anion gap: 9 (ref 5–15)
BUN: 36 mg/dL — ABNORMAL HIGH (ref 6–20)
CO2: 27 mmol/L (ref 22–32)
Calcium: 9.3 mg/dL (ref 8.9–10.3)
Chloride: 103 mmol/L (ref 101–111)
Creatinine, Ser: 1.01 mg/dL — ABNORMAL HIGH (ref 0.44–1.00)
GFR calc Af Amer: >60 mL/min (ref >60)
GFR calc non Af Amer: 56 mL/min — ABNORMAL LOW (ref >60)
Glucose, Bld: 103 mg/dL — ABNORMAL HIGH (ref 65–99)
Potassium: 3.7 mmol/L (ref 3.5–5.1)
Sodium: 139 mmol/L (ref 135–145)
Total Bilirubin: 0.7 mg/dL (ref 0.3–1.2)
Total Protein: 7.9 g/dL (ref 6.5–8.1)

## 2017-09-09 LAB — TYPE AND SCREEN
ABO/RH(D): O POS
Antibody Screen: NEGATIVE

## 2017-09-09 LAB — PROTIME-INR
INR: 1.04
Prothrombin Time: 13.5 seconds (ref 11.4–15.2)

## 2017-09-09 LAB — SURGICAL PCR SCREEN
MRSA, PCR: NEGATIVE
Staphylococcus aureus: NEGATIVE

## 2017-09-09 LAB — HEMOGLOBIN A1C
Hgb A1c MFr Bld: 6.5 % — ABNORMAL HIGH (ref 4.8–5.6)
Mean Plasma Glucose: 139.85 mg/dL

## 2017-09-09 LAB — SEDIMENTATION RATE: Sed Rate: 37 mm/hr — ABNORMAL HIGH (ref 0–30)

## 2017-09-09 NOTE — Patient Instructions (Signed)
Your procedure is scheduled on: Wednesday 09/23/17 Report to [place]. To find out your arrival time please call [phone] between 1PM - 3PM on Tuesday 09/22/17.  Remember: Instructions that are not followed completely may result in serious medical risk, up to and including death, or upon the discretion of your surgeon and anesthesiologist your surgery may need to be rescheduled.     _X__ 1. Do not eat food after midnight the night before your procedure.                 No gum chewing or hard candies. You may drink clear liquids up to 2 hours                 before you are scheduled to arrive for your surgery- DO not drink clear                 liquids within 2 hours of the start of your surgery.                 Clear Liquids include:  water, apple juice without pulp, clear carbohydrate                 drink such as Clearfast or Gatorade, Black Coffee or Tea (Do not add                 anything to coffee or tea).  __X__2.  On the morning of surgery brush your teeth with toothpaste and water, you                 may rinse your mouth with mouthwash if you wish.  Do not swallow any              toothpaste of mouthwash.     _X__ 3.  No Alcohol for 24 hours before or after surgery.   _X__ 4.  Do Not Smoke or use e-cigarettes For 24 Hours Prior to Your Surgery.                 Do not use any chewable tobacco products for at least 6 hours prior to                 surgery.  ____  5.  Bring all medications with you on the day of surgery if instructed.   __X__  6.  Notify your doctor if there is any change in your medical condition      (cold, fever, infections).     Do not wear jewelry, make-up, hairpins, clips or nail polish. Do not wear lotions, powders, or perfumes. You may wear deodorant. Do not shave 48 hours prior to surgery. Men may shave face and neck. Do not bring valuables to the hospital.    [facility] is not responsible  for any belongings or valuables.  Contacts, dentures or bridgework may not be worn into surgery. Leave your suitcase in the car. After surgery it may be brought to your room. For patients admitted to the hospital, discharge time is determined by your treatment team.   Patients discharged the day of surgery will not be allowed to drive home.   Please read over the following fact sheets that you were given:   MRSA Information   __X__ Take these medicines the morning of surgery with A SIP OF WATER:    1. CETIRIZINE  2. LEVOTHYROXINE  3. OMEPRAZOLE  4.  5.  6.  ____ Fleet Enema (as directed)   __X__ Use CHG Soap as directed  ____ Use inhalers on the day of surgery  ____ Stop metformin/Janumet/Farxiga 2 days prior to surgery    ____ Take 1/2 of usual insulin dose the night before surgery. No insulin the morning          of surgery.   ____ Stop Blood Thinners Coumadin/Plavix/Xarelto/Pleta/Pradaxa/Eliquis/Effient/Aspirin  on   __X__ Stop Anti-inflammatories such as Advil, Ibuprofen, Motrin, BC or Goodies  Powder, Naprosyn, Naproxen, Aleve YOU MAY USE TYLENOL/ACETAMINOPHEN   __X__ Stop herbal supplements, fish oil or vitamin E until after surgery.  STOP KETO AND BLACK COHOSH 7 DAYS PRIOR  ____ Bring C-Pap to the hospital.

## 2017-09-09 NOTE — Pre-Procedure Instructions (Signed)
Anterior infact noted on previous EKG from 08/01/15

## 2017-09-10 LAB — URINE CULTURE
Culture: NO GROWTH
Special Requests: NORMAL

## 2017-09-22 ENCOUNTER — Encounter: Payer: Self-pay | Admitting: Orthopedic Surgery

## 2017-09-22 DIAGNOSIS — I10 Essential (primary) hypertension: Secondary | ICD-10-CM | POA: Insufficient documentation

## 2017-09-22 DIAGNOSIS — E039 Hypothyroidism, unspecified: Secondary | ICD-10-CM | POA: Insufficient documentation

## 2017-09-22 DIAGNOSIS — E119 Type 2 diabetes mellitus without complications: Secondary | ICD-10-CM | POA: Insufficient documentation

## 2017-09-22 DIAGNOSIS — E559 Vitamin D deficiency, unspecified: Secondary | ICD-10-CM | POA: Insufficient documentation

## 2017-09-22 MED ORDER — CEFAZOLIN SODIUM-DEXTROSE 2-4 GM/100ML-% IV SOLN: 2.0000 g | INTRAVENOUS | Status: DC

## 2017-09-22 MED ORDER — TRANEXAMIC ACID 1000 MG/10ML IV SOLN
1000.0000 mg | INTRAVENOUS | Status: DC
  Filled 2017-09-22: qty 10

## 2017-09-22 NOTE — Discharge Instructions (Signed)
°  Instructions after Total Knee Replacement ° ° Alicja Everitt P. Esta Carmon, Jr., M.D.    ° Dept. of Orthopaedics & Sports Medicine ° [facility] ° [address] ° , [place]  [address] ° Phone: [phone]   Fax: [phone] ° °  °DIET: °• Drink plenty of non-alcoholic fluids. °• Resume your normal diet. Include foods high in fiber. ° °ACTIVITY:  °• You may use crutches or a walker with weight-bearing as tolerated, unless instructed otherwise. °• You may be weaned off of the walker or crutches by your Physical Therapist.  °• Do NOT place pillows under the knee. Anything placed under the knee could limit your ability to straighten the knee.   °• Continue doing gentle exercises. Exercising will reduce the pain and swelling, increase motion, and prevent muscle weakness.   °• Please continue to use the TED compression stockings for 6 weeks. You may remove the stockings at night, but should reapply them in the morning. °• Do not drive or operate any equipment until instructed. ° °WOUND CARE:  °• Continue to use the PolarCare or ice packs periodically to reduce pain and swelling. °• You may bathe or shower after the staples are removed at the first office visit following surgery. ° °MEDICATIONS: °• You may resume your regular medications. °• Please take the pain medication as prescribed on the medication. °• Do not take pain medication on an empty stomach. °• You have been given a prescription for a blood thinner (Lovenox or Coumadin). Please take the medication as instructed. (NOTE: After completing a 2 week course of Lovenox, take one Enteric-coated aspirin once a day. This along with elevation will help reduce the possibility of phlebitis in your operated leg.) °• Do not drive or drink alcoholic beverages when taking pain medications. ° °CALL THE OFFICE FOR: °• Temperature above 101 degrees °• Excessive bleeding or drainage on the dressing. °• Excessive swelling, coldness, or paleness of the toes. °• Persistent  nausea and vomiting. ° °FOLLOW-UP:  °• You should have an appointment to return to the office in 10-14 days after surgery. °• Arrangements have been made for continuation of Physical Therapy (either home therapy or outpatient therapy). °  °

## 2017-09-23 ENCOUNTER — Other Ambulatory Visit: Payer: Self-pay

## 2017-09-23 ENCOUNTER — Encounter
Admission: AD | Disposition: A | Discharge status: Home Health | Payer: Self-pay | Source: Ambulatory Visit | Attending: Orthopedic Surgery

## 2017-09-23 ENCOUNTER — Ambulatory Visit: Payer: BLUE CROSS/BLUE SHIELD | Admitting: Certified Registered Nurse Anesthetist

## 2017-09-23 ENCOUNTER — Inpatient Hospital Stay: Payer: BLUE CROSS/BLUE SHIELD

## 2017-09-23 ENCOUNTER — Inpatient Hospital Stay
Admission: AD | Admit: 2017-09-23 | Discharge: 2017-09-25 | DRG: 470 | Disposition: A | Discharge status: Home Health | Payer: BLUE CROSS/BLUE SHIELD | Source: Ambulatory Visit | Attending: Orthopedic Surgery | Admitting: Orthopedic Surgery

## 2017-09-23 DIAGNOSIS — Z79890 Hormone replacement therapy (postmenopausal): Secondary | ICD-10-CM

## 2017-09-23 DIAGNOSIS — E559 Vitamin D deficiency, unspecified: Secondary | ICD-10-CM | POA: Diagnosis present

## 2017-09-23 DIAGNOSIS — M1711 Unilateral primary osteoarthritis, right knee: Principal | ICD-10-CM | POA: Diagnosis present

## 2017-09-23 DIAGNOSIS — Z6841 Body Mass Index (BMI) 40.0 and over, adult: Secondary | ICD-10-CM

## 2017-09-23 DIAGNOSIS — E7800 Pure hypercholesterolemia, unspecified: Secondary | ICD-10-CM | POA: Diagnosis present

## 2017-09-23 DIAGNOSIS — Z886 Allergy status to analgesic agent status: Secondary | ICD-10-CM | POA: Diagnosis not present

## 2017-09-23 DIAGNOSIS — E039 Hypothyroidism, unspecified: Secondary | ICD-10-CM | POA: Diagnosis present

## 2017-09-23 DIAGNOSIS — Z8249 Family history of ischemic heart disease and other diseases of the circulatory system: Secondary | ICD-10-CM

## 2017-09-23 DIAGNOSIS — Z885 Allergy status to narcotic agent status: Secondary | ICD-10-CM

## 2017-09-23 DIAGNOSIS — Z791 Long term (current) use of non-steroidal anti-inflammatories (NSAID): Secondary | ICD-10-CM | POA: Diagnosis not present

## 2017-09-23 DIAGNOSIS — Z79899 Other long term (current) drug therapy: Secondary | ICD-10-CM

## 2017-09-23 DIAGNOSIS — E119 Type 2 diabetes mellitus without complications: Secondary | ICD-10-CM | POA: Diagnosis present

## 2017-09-23 DIAGNOSIS — I10 Essential (primary) hypertension: Secondary | ICD-10-CM | POA: Diagnosis present

## 2017-09-23 DIAGNOSIS — Z96659 Presence of unspecified artificial knee joint: Secondary | ICD-10-CM

## 2017-09-23 DIAGNOSIS — M25761 Osteophyte, right knee: Secondary | ICD-10-CM | POA: Diagnosis present

## 2017-09-23 DIAGNOSIS — K219 Gastro-esophageal reflux disease without esophagitis: Secondary | ICD-10-CM | POA: Diagnosis present

## 2017-09-23 DIAGNOSIS — F40240 Claustrophobia: Secondary | ICD-10-CM | POA: Diagnosis present

## 2017-09-23 DIAGNOSIS — M25561 Pain in right knee: Secondary | ICD-10-CM | POA: Diagnosis present

## 2017-09-23 HISTORY — PX: KNEE ARTHROPLASTY: SHX992

## 2017-09-23 LAB — ABO/RH: ABO/RH(D): O POS

## 2017-09-23 SURGERY — ARTHROPLASTY, KNEE, TOTAL, USING IMAGELESS COMPUTER-ASSISTED NAVIGATION
Anesthesia: Spinal | Laterality: Right

## 2017-09-23 MED ORDER — LIDOCAINE HCL (PF) 2 % IJ SOLN
INTRAMUSCULAR | Status: AC
  Filled 2017-09-23: qty 10

## 2017-09-23 MED ORDER — METOCLOPRAMIDE HCL 10 MG PO TABS: 5.0000 mg | ORAL_TABLET | Freq: Three times a day (TID) | ORAL | Status: DC | PRN

## 2017-09-23 MED ORDER — BUPIVACAINE LIPOSOME 1.3 % IJ SUSP
INTRAMUSCULAR | Status: AC
  Filled 2017-09-23: qty 20

## 2017-09-23 MED ORDER — ACETAMINOPHEN 10 MG/ML IV SOLN
INTRAVENOUS | Status: AC
  Filled 2017-09-23: qty 100

## 2017-09-23 MED ORDER — MIDAZOLAM HCL 2 MG/2ML IJ SOLN
INTRAMUSCULAR | Status: AC
  Filled 2017-09-23: qty 2

## 2017-09-23 MED ORDER — NEOMYCIN-POLYMYXIN B GU 40-200000 IR SOLN
Status: AC
  Filled 2017-09-23: qty 20

## 2017-09-23 MED ORDER — BUPIVACAINE HCL (PF) 0.5 % IJ SOLN
INTRAMUSCULAR | Status: DC | PRN
  Administered 2017-09-23: 2.5 mL

## 2017-09-23 MED ORDER — FENTANYL CITRATE (PF) 100 MCG/2ML IJ SOLN
INTRAMUSCULAR | Status: AC
  Filled 2017-09-23: qty 2

## 2017-09-23 MED ORDER — ONDANSETRON HCL 4 MG/2ML IJ SOLN: 4.0000 mg | Freq: Once | INTRAMUSCULAR | Status: DC | PRN

## 2017-09-23 MED ORDER — LORATADINE 10 MG PO TABS
10.0000 mg | ORAL_TABLET | Freq: Every day | ORAL | Status: DC
  Administered 2017-09-24 – 2017-09-25 (×2): 10 mg via ORAL
  Filled 2017-09-23 (×2): qty 1

## 2017-09-23 MED ORDER — FENTANYL CITRATE (PF) 100 MCG/2ML IJ SOLN: 25.0000 ug | INTRAMUSCULAR | Status: DC | PRN

## 2017-09-23 MED ORDER — PROPOFOL 500 MG/50ML IV EMUL
INTRAVENOUS | Status: AC
  Filled 2017-09-23: qty 50

## 2017-09-23 MED ORDER — MENTHOL 3 MG MT LOZG: 1.0000 | LOZENGE | OROMUCOSAL | Status: DC | PRN

## 2017-09-23 MED ORDER — NEOMYCIN-POLYMYXIN B GU 40-200000 IR SOLN
Status: DC | PRN
Start: 2017-09-23 — End: 2017-09-23
  Administered 2017-09-23: 14 mL

## 2017-09-23 MED ORDER — DEXAMETHASONE SODIUM PHOSPHATE 10 MG/ML IJ SOLN
8.0000 mg | Freq: Once | INTRAMUSCULAR | Status: AC
  Administered 2017-09-23: 8 mg via INTRAVENOUS

## 2017-09-23 MED ORDER — CALCIUM-VITAMIN D 500-200 MG-UNIT PO TABS
1.0000 | ORAL_TABLET | Freq: Every day | ORAL | Status: DC
  Administered 2017-09-24 – 2017-09-25 (×2): 1 via ORAL
  Filled 2017-09-23 (×3): qty 1

## 2017-09-23 MED ORDER — ADULT MULTIVITAMIN W/MINERALS CH
1.0000 | ORAL_TABLET | Freq: Every day | ORAL | Status: DC
  Administered 2017-09-24 – 2017-09-25 (×2): 1 via ORAL
  Filled 2017-09-23 (×2): qty 1

## 2017-09-23 MED ORDER — SODIUM CHLORIDE 0.9 % IJ SOLN
INTRAMUSCULAR | Status: AC
  Filled 2017-09-23: qty 50

## 2017-09-23 MED ORDER — ONDANSETRON HCL 4 MG PO TABS: 4.0000 mg | ORAL_TABLET | Freq: Four times a day (QID) | ORAL | Status: DC | PRN

## 2017-09-23 MED ORDER — PROPOFOL 10 MG/ML IV BOLUS
INTRAVENOUS | Status: DC | PRN
  Administered 2017-09-23: 30 mg via INTRAVENOUS

## 2017-09-23 MED ORDER — ALUM & MAG HYDROXIDE-SIMETH 200-200-20 MG/5ML PO SUSP: 30.0000 mL | ORAL | Status: DC | PRN

## 2017-09-23 MED ORDER — BISACODYL 10 MG RE SUPP: 10.0000 mg | Freq: Every day | RECTAL | Status: DC | PRN

## 2017-09-23 MED ORDER — OXYCODONE HCL 5 MG PO TABS
10.0000 mg | ORAL_TABLET | ORAL | Status: DC | PRN
  Administered 2017-09-24 – 2017-09-25 (×6): 10 mg via ORAL
  Filled 2017-09-23 (×6): qty 2

## 2017-09-23 MED ORDER — GABAPENTIN 300 MG PO CAPS
300.0000 mg | ORAL_CAPSULE | Freq: Every day | ORAL | Status: DC
  Administered 2017-09-23 – 2017-09-24 (×2): 300 mg via ORAL
  Filled 2017-09-23 (×2): qty 1

## 2017-09-23 MED ORDER — MIDAZOLAM HCL 5 MG/5ML IJ SOLN
INTRAMUSCULAR | Status: DC | PRN
  Administered 2017-09-23: 2 mg via INTRAVENOUS

## 2017-09-23 MED ORDER — LACTATED RINGERS IV SOLN
INTRAVENOUS | Status: DC
  Administered 2017-09-23 (×2): via INTRAVENOUS

## 2017-09-23 MED ORDER — CEFAZOLIN SODIUM-DEXTROSE 2-3 GM-%(50ML) IV SOLR
INTRAVENOUS | Status: DC | PRN
  Administered 2017-09-23: 2 g via INTRAVENOUS

## 2017-09-23 MED ORDER — GLYCOPYRROLATE 0.2 MG/ML IJ SOLN
INTRAMUSCULAR | Status: AC
  Filled 2017-09-23: qty 1

## 2017-09-23 MED ORDER — MAGNESIUM OXIDE 400 (241.3 MG) MG PO TABS
400.0000 mg | ORAL_TABLET | Freq: Every day | ORAL | Status: DC
  Administered 2017-09-24 – 2017-09-25 (×2): 400 mg via ORAL
  Filled 2017-09-23 (×2): qty 1

## 2017-09-23 MED ORDER — TETRACAINE HCL 1 % IJ SOLN
INTRAMUSCULAR | Status: DC | PRN
  Administered 2017-09-23: 5 mg via INTRASPINAL

## 2017-09-23 MED ORDER — PANTOPRAZOLE SODIUM 40 MG PO TBEC
40.0000 mg | DELAYED_RELEASE_TABLET | Freq: Two times a day (BID) | ORAL | Status: DC
  Administered 2017-09-23 – 2017-09-25 (×4): 40 mg via ORAL
  Filled 2017-09-23 (×4): qty 1

## 2017-09-23 MED ORDER — BUPIVACAINE HCL (PF) 0.25 % IJ SOLN
INTRAMUSCULAR | Status: AC
  Filled 2017-09-23: qty 60

## 2017-09-23 MED ORDER — LISINOPRIL 20 MG PO TABS
40.0000 mg | ORAL_TABLET | Freq: Every day | ORAL | Status: DC
  Administered 2017-09-23 – 2017-09-24 (×2): 40 mg via ORAL
  Filled 2017-09-23 (×2): qty 2

## 2017-09-23 MED ORDER — GABAPENTIN 300 MG PO CAPS
ORAL_CAPSULE | ORAL | Status: AC
  Administered 2017-09-23: 300 mg via ORAL
  Filled 2017-09-23: qty 1

## 2017-09-23 MED ORDER — BUPIVACAINE HCL (PF) 0.5 % IJ SOLN
INTRAMUSCULAR | Status: AC
  Filled 2017-09-23: qty 10

## 2017-09-23 MED ORDER — BUPIVACAINE HCL (PF) 0.25 % IJ SOLN
INTRAMUSCULAR | Status: DC | PRN
  Administered 2017-09-23: 60 mL

## 2017-09-23 MED ORDER — ENOXAPARIN SODIUM 30 MG/0.3ML ~~LOC~~ SOLN
30.0000 mg | Freq: Two times a day (BID) | SUBCUTANEOUS | Status: DC
  Administered 2017-09-24 – 2017-09-25 (×3): 30 mg via SUBCUTANEOUS
  Filled 2017-09-23 (×3): qty 0.3

## 2017-09-23 MED ORDER — VITAMIN D 1000 UNITS PO TABS
1000.0000 [IU] | ORAL_TABLET | Freq: Every day | ORAL | Status: DC
  Administered 2017-09-24 – 2017-09-25 (×2): 1000 [IU] via ORAL
  Filled 2017-09-23 (×2): qty 1

## 2017-09-23 MED ORDER — PHENOL 1.4 % MT LIQD: 1.0000 | OROMUCOSAL | Status: DC | PRN

## 2017-09-23 MED ORDER — ACETAMINOPHEN 325 MG PO TABS: 325.0000 mg | ORAL_TABLET | Freq: Four times a day (QID) | ORAL | Status: DC | PRN

## 2017-09-23 MED ORDER — OXYCODONE HCL 5 MG PO TABS
5.0000 mg | ORAL_TABLET | ORAL | Status: DC | PRN
  Administered 2017-09-23 – 2017-09-24 (×3): 5 mg via ORAL
  Filled 2017-09-23 (×3): qty 1

## 2017-09-23 MED ORDER — GABAPENTIN 300 MG PO CAPS
300.0000 mg | ORAL_CAPSULE | Freq: Once | ORAL | Status: AC
  Administered 2017-09-23: 300 mg via ORAL

## 2017-09-23 MED ORDER — TRANEXAMIC ACID 1000 MG/10ML IV SOLN
1000.0000 mg | Freq: Once | INTRAVENOUS | Status: AC
  Administered 2017-09-23: 1000 mg via INTRAVENOUS
  Filled 2017-09-23: qty 10

## 2017-09-23 MED ORDER — METOCLOPRAMIDE HCL 5 MG/ML IJ SOLN: 5.0000 mg | Freq: Three times a day (TID) | INTRAMUSCULAR | Status: DC | PRN

## 2017-09-23 MED ORDER — FENTANYL CITRATE (PF) 100 MCG/2ML IJ SOLN
INTRAMUSCULAR | Status: DC | PRN
  Administered 2017-09-23: 100 ug via INTRAVENOUS

## 2017-09-23 MED ORDER — CHLORHEXIDINE GLUCONATE 4 % EX LIQD: 60.0000 mL | Freq: Once | CUTANEOUS | Status: DC

## 2017-09-23 MED ORDER — PRAVASTATIN SODIUM 20 MG PO TABS
40.0000 mg | ORAL_TABLET | Freq: Every day | ORAL | Status: DC
  Administered 2017-09-24: 40 mg via ORAL
  Filled 2017-09-23: qty 2

## 2017-09-23 MED ORDER — SODIUM CHLORIDE 0.9 % IV SOLN
INTRAVENOUS | Status: DC | PRN
  Administered 2017-09-23: 60 mL

## 2017-09-23 MED ORDER — SODIUM CHLORIDE 0.9 % IV SOLN
INTRAVENOUS | Status: DC
  Administered 2017-09-23 – 2017-09-24 (×2): via INTRAVENOUS

## 2017-09-23 MED ORDER — FERROUS SULFATE 325 (65 FE) MG PO TABS
325.0000 mg | ORAL_TABLET | Freq: Two times a day (BID) | ORAL | Status: DC
  Administered 2017-09-24 – 2017-09-25 (×3): 325 mg via ORAL
  Filled 2017-09-23 (×3): qty 1

## 2017-09-23 MED ORDER — SODIUM CHLORIDE 0.9 % IV SOLN
INTRAVENOUS | Status: DC | PRN
  Administered 2017-09-23: 30 ug/min via INTRAVENOUS

## 2017-09-23 MED ORDER — BISOPROLOL FUMARATE 5 MG PO TABS
5.0000 mg | ORAL_TABLET | Freq: Every day | ORAL | Status: DC
  Administered 2017-09-24 – 2017-09-25 (×2): 5 mg via ORAL
  Filled 2017-09-23 (×3): qty 1

## 2017-09-23 MED ORDER — DIPHENHYDRAMINE HCL 12.5 MG/5ML PO ELIX: 12.5000 mg | ORAL_SOLUTION | ORAL | Status: DC | PRN

## 2017-09-23 MED ORDER — ACETAMINOPHEN 10 MG/ML IV SOLN
1000.0000 mg | Freq: Four times a day (QID) | INTRAVENOUS | Status: AC
  Administered 2017-09-23 – 2017-09-24 (×4): 1000 mg via INTRAVENOUS
  Filled 2017-09-23 (×4): qty 100

## 2017-09-23 MED ORDER — HYDROMORPHONE HCL 1 MG/ML IJ SOLN: 0.5000 mg | INTRAMUSCULAR | Status: DC | PRN

## 2017-09-23 MED ORDER — TRANEXAMIC ACID 1000 MG/10ML IV SOLN
INTRAVENOUS | Status: DC | PRN
  Administered 2017-09-23: 1000 mg via INTRAVENOUS

## 2017-09-23 MED ORDER — FLEET ENEMA 7-19 GM/118ML RE ENEM: 1.0000 | ENEMA | Freq: Once | RECTAL | Status: DC | PRN

## 2017-09-23 MED ORDER — PHENYLEPHRINE HCL 10 MG/ML IJ SOLN
INTRAMUSCULAR | Status: AC
  Filled 2017-09-23: qty 1

## 2017-09-23 MED ORDER — METOCLOPRAMIDE HCL 10 MG PO TABS
10.0000 mg | ORAL_TABLET | Freq: Three times a day (TID) | ORAL | Status: DC
  Administered 2017-09-23 – 2017-09-25 (×6): 10 mg via ORAL
  Filled 2017-09-23 (×6): qty 1

## 2017-09-23 MED ORDER — MAGNESIUM HYDROXIDE 400 MG/5ML PO SUSP: 30.0000 mL | Freq: Every day | ORAL | Status: DC | PRN

## 2017-09-23 MED ORDER — DEXAMETHASONE SODIUM PHOSPHATE 10 MG/ML IJ SOLN
INTRAMUSCULAR | Status: AC
  Administered 2017-09-23: 8 mg via INTRAVENOUS
  Filled 2017-09-23: qty 1

## 2017-09-23 MED ORDER — CEFAZOLIN SODIUM-DEXTROSE 2-4 GM/100ML-% IV SOLN
2.0000 g | Freq: Four times a day (QID) | INTRAVENOUS | Status: AC
  Administered 2017-09-23 – 2017-09-24 (×4): 2 g via INTRAVENOUS
  Filled 2017-09-23 (×4): qty 100

## 2017-09-23 MED ORDER — HYDROCHLOROTHIAZIDE 25 MG PO TABS
25.0000 mg | ORAL_TABLET | Freq: Every day | ORAL | Status: DC
  Administered 2017-09-24 – 2017-09-25 (×2): 25 mg via ORAL
  Filled 2017-09-23 (×2): qty 1

## 2017-09-23 MED ORDER — LEVOTHYROXINE SODIUM 125 MCG PO TABS
125.0000 ug | ORAL_TABLET | Freq: Every day | ORAL | Status: DC
  Administered 2017-09-24 – 2017-09-25 (×2): 125 ug via ORAL
  Filled 2017-09-23 (×3): qty 1

## 2017-09-23 MED ORDER — TRAMADOL HCL 50 MG PO TABS
50.0000 mg | ORAL_TABLET | ORAL | Status: DC | PRN
  Administered 2017-09-23 – 2017-09-25 (×7): 100 mg via ORAL
  Filled 2017-09-23 (×7): qty 2

## 2017-09-23 MED ORDER — ACETAMINOPHEN 10 MG/ML IV SOLN
INTRAVENOUS | Status: DC | PRN
  Administered 2017-09-23: 1000 mg via INTRAVENOUS

## 2017-09-23 MED ORDER — CEFAZOLIN SODIUM-DEXTROSE 2-4 GM/100ML-% IV SOLN
INTRAVENOUS | Status: AC
  Filled 2017-09-23: qty 100

## 2017-09-23 MED ORDER — PROPOFOL 10 MG/ML IV BOLUS
INTRAVENOUS | Status: AC
  Filled 2017-09-23: qty 20

## 2017-09-23 MED ORDER — PROPOFOL 500 MG/50ML IV EMUL
INTRAVENOUS | Status: DC | PRN
  Administered 2017-09-23: 50 ug/kg/min via INTRAVENOUS

## 2017-09-23 MED ORDER — ONDANSETRON HCL 4 MG/2ML IJ SOLN: 4.0000 mg | Freq: Four times a day (QID) | INTRAMUSCULAR | Status: DC | PRN

## 2017-09-23 MED ORDER — SENNOSIDES-DOCUSATE SODIUM 8.6-50 MG PO TABS
1.0000 | ORAL_TABLET | Freq: Two times a day (BID) | ORAL | Status: DC
  Administered 2017-09-23 – 2017-09-25 (×4): 1 via ORAL
  Filled 2017-09-23 (×4): qty 1

## 2017-09-23 SURGICAL SUPPLY — 67 items
BATTERY INSTRU NAVIGATION (MISCELLANEOUS) ×8 IMPLANT
BLADE SAW 1 (BLADE) ×2 IMPLANT
BLADE SAW 1/2 (BLADE) ×2 IMPLANT
BLADE SAW 70X12.5 (BLADE) ×2 IMPLANT
BONE CEMENT GENTAMICIN (Cement) ×4 IMPLANT
CANISTER SUCT 1200ML W/VALVE (MISCELLANEOUS) ×2 IMPLANT
CANISTER SUCT 3000ML PPV (MISCELLANEOUS) ×4 IMPLANT
CAPT KNEE TOTAL 3 ATTUNE ×2 IMPLANT
CEMENT BONE GENTAMICIN 40 (Cement) ×2 IMPLANT
COOLER POLAR GLACIER W/PUMP (MISCELLANEOUS) ×2 IMPLANT
CUFF TOURN 24 STER (MISCELLANEOUS) IMPLANT
CUFF TOURN 30 STER DUAL PORT (MISCELLANEOUS) IMPLANT
CUFF TOURN 34 STER (MISCELLANEOUS) ×2 IMPLANT
DRAPE SHEET LG 3/4 BI-LAMINATE (DRAPES) ×2 IMPLANT
DRSG DERMACEA 8X12 NADH (GAUZE/BANDAGES/DRESSINGS) ×2 IMPLANT
DRSG OPSITE POSTOP 4X14 (GAUZE/BANDAGES/DRESSINGS) ×2 IMPLANT
DRSG TEGADERM 4X4.75 (GAUZE/BANDAGES/DRESSINGS) ×2 IMPLANT
DURAPREP 26ML APPLICATOR (WOUND CARE) ×4 IMPLANT
ELECT CAUTERY BLADE 6.4 (BLADE) ×2 IMPLANT
ELECT REM PT RETURN 9FT ADLT (ELECTROSURGICAL) ×2
ELECTRODE REM PT RTRN 9FT ADLT (ELECTROSURGICAL) ×1 IMPLANT
EVACUATOR 1/8 PVC DRAIN (DRAIN) ×2 IMPLANT
EX-PIN ORTHOLOCK NAV 4X150 (PIN) ×4 IMPLANT
GLOVE BIOGEL M STRL SZ7.5 (GLOVE) ×4 IMPLANT
GLOVE BIOGEL PI IND STRL 9 (GLOVE) ×1 IMPLANT
GLOVE BIOGEL PI INDICATOR 9 (GLOVE) ×1
GLOVE INDICATOR 8.0 STRL GRN (GLOVE) ×2 IMPLANT
GLOVE SURG SYN 9.0  PF PI (GLOVE) ×1
GLOVE SURG SYN 9.0 PF PI (GLOVE) ×1 IMPLANT
GOWN STRL REUS W/ TWL LRG LVL3 (GOWN DISPOSABLE) ×2 IMPLANT
GOWN STRL REUS W/TWL 2XL LVL3 (GOWN DISPOSABLE) ×2 IMPLANT
GOWN STRL REUS W/TWL LRG LVL3 (GOWN DISPOSABLE) ×2
HOLDER FOLEY CATH W/STRAP (MISCELLANEOUS) ×2 IMPLANT
HOOD PEEL AWAY FLYTE STAYCOOL (MISCELLANEOUS) ×4 IMPLANT
KIT TURNOVER KIT A (KITS) ×2 IMPLANT
KNIFE SCULPS 14X20 (INSTRUMENTS) ×2 IMPLANT
LABEL OR SOLS (LABEL) ×2 IMPLANT
NDL SAFETY ECLIPSE 18X1.5 (NEEDLE) ×1 IMPLANT
NEEDLE HYPO 18GX1.5 SHARP (NEEDLE) ×1
NEEDLE SPNL 20GX3.5 QUINCKE YW (NEEDLE) ×4 IMPLANT
NS IRRIG 500ML POUR BTL (IV SOLUTION) ×2 IMPLANT
PACK TOTAL KNEE (MISCELLANEOUS) ×2 IMPLANT
PAD WRAPON POLAR KNEE (MISCELLANEOUS) ×1 IMPLANT
PAD WRAPON POLAR KNEE LONG (MISCELLANEOUS) ×1 IMPLANT
PIN DRILL QUICK PACK ×2 IMPLANT
PIN FIXATION 1/8DIA X 3INL (PIN) ×2 IMPLANT
PULSAVAC PLUS IRRIG FAN TIP (DISPOSABLE) ×2
SOL .9 NS 3000ML IRR  AL (IV SOLUTION) ×1
SOL .9 NS 3000ML IRR UROMATIC (IV SOLUTION) ×1 IMPLANT
SOL PREP PVP 2OZ (MISCELLANEOUS) ×2
SOLUTION PREP PVP 2OZ (MISCELLANEOUS) ×1 IMPLANT
SPONGE DRAIN TRACH 4X4 STRL 2S (GAUZE/BANDAGES/DRESSINGS) ×2 IMPLANT
STAPLER SKIN PROX 35W (STAPLE) ×2 IMPLANT
STRAP TIBIA SHORT (MISCELLANEOUS) ×2 IMPLANT
SUCTION FRAZIER HANDLE 10FR (MISCELLANEOUS) ×1
SUCTION TUBE FRAZIER 10FR DISP (MISCELLANEOUS) ×1 IMPLANT
SUT VIC AB 0 CT1 36 (SUTURE) ×2 IMPLANT
SUT VIC AB 1 CT1 36 (SUTURE) ×4 IMPLANT
SUT VIC AB 2-0 CT2 27 (SUTURE) ×2 IMPLANT
SYR 20CC LL (SYRINGE) ×2 IMPLANT
SYR 30ML LL (SYRINGE) ×4 IMPLANT
TIP FAN IRRIG PULSAVAC PLUS (DISPOSABLE) ×1 IMPLANT
TOWEL OR 17X26 4PK STRL BLUE (TOWEL DISPOSABLE) ×2 IMPLANT
TOWER CARTRIDGE SMART MIX (DISPOSABLE) ×2 IMPLANT
TRAY FOLEY W/METER SILVER 16FR (SET/KITS/TRAYS/PACK) ×2 IMPLANT
WRAPON POLAR PAD KNEE (MISCELLANEOUS) ×2
WRAPON POLAR PAD KNEE LONG (MISCELLANEOUS) ×2

## 2017-09-23 NOTE — Anesthesia Post-op Follow-up Note (Signed)
Anesthesia QCDR form completed.        

## 2017-09-23 NOTE — Progress Notes (Signed)
ADMISSION NOTE:  Pt alert and oriented X4. No complaints of pain. Bone foam on. Surgical dressing clean dry and intact. Family at bedside. Bed in lowest position call bell in reach and bed alarm on.

## 2017-09-23 NOTE — Anesthesia Preprocedure Evaluation (Signed)
Anesthesia Evaluation  Patient identified by MRN, date of birth, ID band Patient awake    Reviewed: Allergy & Precautions, H&P , NPO status , Patient's Chart, lab work & pertinent test results, reviewed documented beta blocker date and time   History of Anesthesia Complications Negative for: history of anesthetic complications  Airway Mallampati: IV  TM Distance: >3 FB Neck ROM: full  Mouth opening: Limited Mouth Opening  Dental  (+) Teeth Intact, Dental Advidsory Given   Pulmonary neg shortness of breath, sleep apnea , neg COPD, neg recent URI,           Cardiovascular Exercise Tolerance: Good hypertension, On Medications (-) angina(-) CAD, (-) Past MI, (-) Cardiac Stents and (-) CABG (-) dysrhythmias (-) Valvular Problems/Murmurs     Neuro/Psych negative neurological ROS  negative psych ROS   GI/Hepatic Neg liver ROS, GERD  Medicated and Controlled,  Endo/Other  diabetesHypothyroidism Morbid obesity  Renal/GU negative Renal ROS  negative genitourinary   Musculoskeletal   Abdominal   Peds  Hematology negative hematology ROS (+)   Anesthesia Other Findings Past Medical History:   Hypertension                                                 Herpes simplex                                               Fibrocystic disease of breast                                Hyperlipidemia                                               Vitamin D deficiency                                         Hypothyroidism                                               Diabetes mellitus without complication                       Hypercholesteremia                                           Sleep apnea                                                  GERD (gastroesophageal reflux disease)  Reproductive/Obstetrics negative OB ROS                             Anesthesia Physical  Anesthesia  Plan  ASA: III  Anesthesia Plan: Spinal   Post-op Pain Management:    Induction: Intravenous  PONV Risk Score and Plan: 2 and Propofol infusion  Airway Management Planned: Simple Face Mask  Additional Equipment:   Intra-op Plan:   Post-operative Plan:   Informed Consent: I have reviewed the patients History and Physical, chart, labs and discussed the procedure including the risks, benefits and alternatives for the proposed anesthesia with the patient or authorized representative who has indicated his/her understanding and acceptance.   Dental Advisory Given  Plan Discussed with: Anesthesiologist, CRNA and Surgeon  Anesthesia Plan Comments:         Anesthesia Quick Evaluation

## 2017-09-23 NOTE — NC FL2 (Signed)
[place] LEVEL OF CARE SCREENING TOOL     IDENTIFICATION  Patient Name: Dana Morton Birthdate: 16-Jul-1950 Sex: female Admission Date (Current Location): 09/23/2017  [place] and [place] Number:  Engineering geologist and Address:  [facility], [address] [place], [place] [address]      Provider Number: [id number]  Attending Physician Name and Address:  Dereck Leep, MD  Relative Name and Phone Number:       Current Level of Care: Hospital Recommended Level of Care: [place] Prior Approval Number:    Date Approved/Denied:   PASRR Number: [id number])  Discharge Plan: SNF    Current Diagnoses: Patient Active Problem List   Diagnosis Date Noted  . S/P total knee arthroplasty 09/23/2017  . Diabetes mellitus type 2, uncomplicated ([place]) 72/03/4708  . Hypertension 09/22/2017  . Hypothyroidism, unspecified 09/22/2017  . Vitamin D deficiency, unspecified 09/22/2017  . Edema of both legs 03/03/2017  . BMI 40.0-44.9, adult ([place]) 12/03/2015  . Pure hypercholesterolemia 05/26/2014    Orientation RESPIRATION BLADDER Height & Weight     Self, Time, Situation, Place  Normal Continent Weight:   Height:     BEHAVIORAL SYMPTOMS/MOOD NEUROLOGICAL BOWEL NUTRITION STATUS      Continent Diet(Diet: Clear Liquid to be Advanced. )  AMBULATORY STATUS COMMUNICATION OF NEEDS Skin   Extensive Assist Verbally Surgical wounds(Incision: Right Knee. )                       Personal Care Assistance Level of Assistance  Bathing, Feeding, Dressing Bathing Assistance: Limited assistance Feeding assistance: Independent Dressing Assistance: Limited assistance     Functional Limitations Info  Sight, Hearing, Speech Sight Info: Adequate Hearing Info: Adequate Speech Info: Adequate    SPECIAL CARE FACTORS FREQUENCY  PT (By licensed PT), OT (By licensed OT)     PT Frequency: (5) OT Frequency: (5)             Contractures      Additional Factors Info  Code Status, Allergies Code Status Info: (Full Code. ) Allergies Info: (Celebrex Celecoxib, Codeine)           Current Medications (09/23/2017):  This is the current hospital active medication list Current Facility-Administered Medications  Medication Dose Route Frequency Provider Last Rate Last Dose  . 0.9 %  sodium chloride infusion   Intravenous Continuous Hooten, Laurice Record, MD 100 mL/hr at 09/23/17 1307    . acetaminophen (OFIRMEV) IV 1,000 mg  1,000 mg Intravenous Q6H Hooten, Laurice Record, MD      . Derrill Memo ON 09/24/2017] acetaminophen (TYLENOL) tablet 325-650 mg  325-650 mg Oral Q6H PRN Hooten, Laurice Record, MD      . alum & mag hydroxide-simeth (MAALOX/MYLANTA) 200-200-20 MG/5ML suspension 30 mL  30 mL Oral Q4H PRN Hooten, Laurice Record, MD      . bisacodyl (DULCOLAX) suppository 10 mg  10 mg Rectal Daily PRN Hooten, Laurice Record, MD      . bisoprolol (ZEBETA) tablet 5 mg  5 mg Oral Daily Hooten, Laurice Record, MD      . calcium-vitamin D 500-200 MG-UNIT per tablet 1 tablet  1 tablet Oral Daily Hooten, Laurice Record, MD      . ceFAZolin (ANCEF) 2-4 GM/100ML-% IVPB           . ceFAZolin (ANCEF) IVPB 2g/100 mL premix  2 g Intravenous Q6H Hooten, Laurice Record, MD      . cholecalciferol (VITAMIN  D) tablet 1,000 Units  1,000 Units Oral Daily Hooten, Laurice Record, MD      . diphenhydrAMINE (BENADRYL) 12.5 MG/5ML elixir 12.5-25 mg  12.5-25 mg Oral Q4H PRN Hooten, Laurice Record, MD      . Derrill Memo ON 09/24/2017] enoxaparin (LOVENOX) injection 30 mg  30 mg Subcutaneous Q12H Hooten, Laurice Record, MD      . ferrous sulfate tablet 325 mg  325 mg Oral BID WC Hooten, Laurice Record, MD      . gabapentin (NEURONTIN) capsule 300 mg  300 mg Oral QHS Hooten, Laurice Record, MD      . hydrochlorothiazide (HYDRODIURIL) tablet 25 mg  25 mg Oral Daily Hooten, Laurice Record, MD      . HYDROmorphone (DILAUDID) injection 0.5-1 mg  0.5-1 mg Intravenous Q4H PRN Hooten, Laurice Record, MD      . Derrill Memo ON 09/24/2017] levothyroxine (SYNTHROID,  LEVOTHROID) tablet 125 mcg  125 mcg Oral QAC breakfast Hooten, Laurice Record, MD      . lisinopril (PRINIVIL,ZESTRIL) tablet 40 mg  40 mg Oral QHS Hooten, Laurice Record, MD      . loratadine (CLARITIN) tablet 10 mg  10 mg Oral Daily Hooten, Laurice Record, MD      . magnesium hydroxide (MILK OF MAGNESIA) suspension 30 mL  30 mL Oral Daily PRN Hooten, Laurice Record, MD      . magnesium oxide (MAG-OX) tablet 400 mg  400 mg Oral Daily Hooten, Laurice Record, MD      . menthol-cetylpyridinium (CEPACOL) lozenge 3 mg  1 lozenge Oral PRN Hooten, Laurice Record, MD       Or  . phenol (CHLORASEPTIC) mouth spray 1 spray  1 spray Mouth/Throat PRN Hooten, Laurice Record, MD      . metoCLOPramide (REGLAN) tablet 5-10 mg  5-10 mg Oral Q8H PRN Hooten, Laurice Record, MD       Or  . metoCLOPramide (REGLAN) injection 5-10 mg  5-10 mg Intravenous Q8H PRN Hooten, Laurice Record, MD      . metoCLOPramide (REGLAN) tablet 10 mg  10 mg Oral TID AC & HS Hooten, Laurice Record, MD      . multivitamin with minerals tablet 1 tablet  1 tablet Oral Daily Hooten, Laurice Record, MD      . ondansetron (ZOFRAN) tablet 4 mg  4 mg Oral Q6H PRN Hooten, Laurice Record, MD       Or  . ondansetron (ZOFRAN) injection 4 mg  4 mg Intravenous Q6H PRN Hooten, Laurice Record, MD      . oxyCODONE (Oxy IR/ROXICODONE) immediate release tablet 10 mg  10 mg Oral Q4H PRN Hooten, Laurice Record, MD      . oxyCODONE (Oxy IR/ROXICODONE) immediate release tablet 5 mg  5 mg Oral Q4H PRN Hooten, Laurice Record, MD      . pantoprazole (PROTONIX) EC tablet 40 mg  40 mg Oral BID Hooten, Laurice Record, MD      . pravastatin (PRAVACHOL) tablet 40 mg  40 mg Oral q1800 Hooten, Laurice Record, MD      . senna-docusate (Senokot-S) tablet 1 tablet  1 tablet Oral BID Hooten, Laurice Record, MD      . sodium phosphate (FLEET) 7-19 GM/118ML enema 1 enema  1 enema Rectal Once PRN Hooten, Laurice Record, MD      . traMADol Veatrice Bourbon) tablet 50-100 mg  50-100 mg Oral Q4H PRN Hooten, Laurice Record, MD         Discharge Medications: Please see discharge summary for a  list of discharge  medications.  Relevant Imaging Results:  Relevant Lab Results:   Additional Information (SSN: [id number])  Jaziya Obarr, Veronia Beets, LCSW

## 2017-09-23 NOTE — H&P (Signed)
The patient has been re-examined, and the chart reviewed, and there have been no interval changes to the documented history and physical.    The risks, benefits, and alternatives have been discussed at length. The patient expressed understanding of the risks benefits and agreed with plans for surgical intervention.  Jaimeson Gopal P. Britlee Skolnik, Jr. M.D.    

## 2017-09-23 NOTE — Progress Notes (Signed)
PT Cancellation Note  Patient Details Name: Dana Morton MRN: [id number] DOB: [birth year]   Cancelled Treatment:    Reason Eval/Treat Not Completed: Medical issues which prohibited therapy(Consult received and chart reviewed.  Patient continues with absent sensation in bilat LEs status post surgical procedure.  Will hold at this time and re-attempt at later time/date as medically appropriate.)   Susi Goslin H. Owens Shark, PT, DPT, NCS 09/23/17, 2:44 PM [phone]

## 2017-09-23 NOTE — Op Note (Signed)
OPERATIVE NOTE  DATE OF SURGERY:  09/23/2017  PATIENT NAME:  Dana Morton   DOB: 1951-01-23  MRN: [id number]  PRE-OPERATIVE DIAGNOSIS: Degenerative arthrosis of the right knee, primary  POST-OPERATIVE DIAGNOSIS:  Same  PROCEDURE:  Right total knee arthroplasty using computer-assisted navigation  SURGEON:  Marciano Sequin. M.D.  ASSISTANT:  Vance Peper, PA (present and scrubbed throughout the case, critical for assistance with exposure, retraction, instrumentation, and closure)  ANESTHESIA: spinal  ESTIMATED BLOOD LOSS: 50 mL  FLUIDS REPLACED: 1100 mL of crystalloid  TOURNIQUET TIME: 97 minutes  DRAINS: 2 medium Hemovac drains  SOFT TISSUE RELEASES: Anterior cruciate ligament, posterior cruciate ligament, deep medial collateral ligament, patellofemoral ligament  IMPLANTS UTILIZED: DePuy Attune size 5N posterior stabilized femoral component (cemented), size 4 rotating platform tibial component (cemented), 35 mm medialized dome patella (cemented), and a 5 mm stabilized rotating platform polyethylene insert.  INDICATIONS FOR SURGERY: Dana Morton is a 67 y.o. year old female with a long history of progressive knee pain. X-rays demonstrated severe degenerative changes in tricompartmental fashion. The patient had not seen any significant improvement despite conservative nonsurgical intervention. After discussion of the risks and benefits of surgical intervention, the patient expressed understanding of the risks benefits and agree with plans for total knee arthroplasty.   The risks, benefits, and alternatives were discussed at length including but not limited to the risks of infection, bleeding, nerve injury, stiffness, blood clots, the need for revision surgery, cardiopulmonary complications, among others, and they were willing to proceed.  PROCEDURE IN DETAIL: The patient was brought into the operating room and, after adequate spinal anesthesia was achieved, a tourniquet was  placed on the patient's upper thigh. The patient's knee and leg were cleaned and prepped with alcohol and DuraPrep and draped in the usual sterile fashion. A "timeout" was performed as per usual protocol. The lower extremity was exsanguinated using an Esmarch, and the tourniquet was inflated to 300 mmHg. An anterior longitudinal incision was made followed by a standard mid vastus approach. The deep fibers of the medial collateral ligament were elevated in a subperiosteal fashion off of the medial flare of the tibia so as to maintain a continuous soft tissue sleeve. The patella was subluxed laterally and the patellofemoral ligament was incised. Inspection of the knee demonstrated severe degenerative changes with full-thickness loss of articular cartilage. Osteophytes were debrided using a rongeur. Anterior and posterior cruciate ligaments were excised. Two 4.0 mm Schanz pins were inserted in the femur and into the tibia for attachment of the array of trackers used for computer-assisted navigation. Hip center was identified using a circumduction technique. Distal landmarks were mapped using the computer. The distal femur and proximal tibia were mapped using the computer. The distal femoral cutting guide was positioned using computer-assisted navigation so as to achieve a 5 distal valgus cut. The femur was sized and it was felt that a size 5N femoral component was appropriate. A size 5 femoral cutting guide was positioned and the anterior cut was performed and verified using the computer. This was followed by completion of the posterior and chamfer cuts. Femoral cutting guide for the central box was then positioned in the center box cut was performed.  Attention was then directed to the proximal tibia. Medial and lateral menisci were excised. The extramedullary tibial cutting guide was positioned using computer-assisted navigation so as to achieve a 0 varus-valgus alignment and 3 posterior slope. The cut was  performed and verified using the computer. The proximal tibia  was sized and it was felt that a size 4 tibial tray was appropriate. Tibial and femoral trials were inserted followed by insertion of a 5 mm polyethylene insert. This allowed for excellent mediolateral soft tissue balancing both in flexion and in full extension. Finally, the patella was cut and prepared so as to accommodate a 35 mm medialized dome patella. A patella trial was placed and the knee was placed through a range of motion with excellent patellar tracking appreciated. The femoral trial was removed after debridement of posterior osteophytes. The central post-hole for the tibial component was reamed followed by insertion of a keel punch. Tibial trials were then removed. Cut surfaces of bone were irrigated with copious amounts of normal saline with antibiotic solution using pulsatile lavage and then suctioned dry. Polymethylmethacrylate cement with gentamicin was prepared in the usual fashion using a vacuum mixer. Cement was applied to the cut surface of the proximal tibia as well as along the undersurface of a size 4 rotating platform tibial component. Tibial component was positioned and impacted into place. Excess cement was removed using [employer] fast streamer. Cement was then applied to the cut surfaces of the femur as well as along the posterior flanges of the size 5N femoral component. The femoral component was positioned and impacted into place. Excess cement was removed using [employer] fast streamer. A 5 mm polyethylene trial was inserted and the knee was brought into full extension with steady axial compression applied. Finally, cement was applied to the backside of a 35 mm medialized dome patella and the patellar component was positioned and patellar clamp applied. Excess cement was removed using [employer] fast streamer. After adequate curing of the cement, the tourniquet was deflated after a total tourniquet time of 97 minutes. Hemostasis was achieved using  electrocautery. The knee was irrigated with copious amounts of normal saline with antibiotic solution using pulsatile lavage and then suctioned dry. 20 mL of 1.3% Exparel and 60 mL of 0.25% Marcaine in 40 mL of normal saline was injected along the posterior capsule, medial and lateral gutters, and along the arthrotomy site. A 5 mm stabilized rotating platform polyethylene insert was inserted and the knee was placed through a range of motion with excellent mediolateral soft tissue balancing appreciated and excellent patellar tracking noted. 2 medium drains were placed in the wound bed and brought out through separate stab incisions. The medial parapatellar portion of the incision was reapproximated using interrupted sutures of #1 Vicryl. Subcutaneous tissue was approximated in layers using first #0 Vicryl followed #2-0 Vicryl. The skin was approximated with skin staples. A sterile dressing was applied.  The patient tolerated the procedure well and was transported to the recovery room in stable condition.    Sorren Vallier P. Holley Bouche., M.D.

## 2017-09-23 NOTE — Anesthesia Procedure Notes (Signed)
Spinal  Patient location during procedure: OR Start time: 09/23/2017 7:25 AM End time: 09/23/2017 7:36 AM Staffing Performed: resident/CRNA  Preanesthetic Checklist Completed: patient identified, site marked, surgical consent, pre-op evaluation, timeout performed, IV checked, risks and benefits discussed and monitors and equipment checked Spinal Block Patient position: sitting Prep: ChloraPrep Patient monitoring: heart rate, continuous pulse ox, blood pressure and cardiac monitor Approach: midline Location: L3-4 Injection technique: single-shot Needle Needle type: Introducer and Pencan  Needle gauge: 24 G Needle length: 9 cm Additional Notes Negative paresthesia. Negative blood return. Positive free-flowing CSF. Expiration date of kit checked and confirmed. Patient tolerated procedure well, without complications.

## 2017-09-23 NOTE — Transfer of Care (Signed)
Immediate Anesthesia Transfer of Care Note  Patient: Dana Morton  Procedure(s) Performed: COMPUTER ASSISTED TOTAL KNEE ARTHROPLASTY (Right )  Patient Location: PACU  Anesthesia Type:Spinal  Level of Consciousness: sedated  Airway & Oxygen Therapy: Patient Spontanous Breathing and Patient connected to nasal cannula oxygen  Post-op Assessment: Report given to RN and Post -op Vital signs reviewed and stable  Post vital signs: Reviewed and stable  Last Vitals:  Vitals:   09/23/17 0632  BP: (!) 149/60  Pulse: 73  Resp: 18  Temp: (!) 36.4 C  SpO2: 96%    Last Pain:  Vitals:   09/23/17 9244  TempSrc: Oral         Complications: No apparent anesthesia complications

## 2017-09-24 MED ORDER — TRAMADOL HCL 50 MG PO TABS: 50.0000 mg | ORAL_TABLET | ORAL | 0 refills | Status: DC | PRN

## 2017-09-24 MED ORDER — OXYCODONE HCL 5 MG PO TABS: 5.0000 mg | ORAL_TABLET | ORAL | 0 refills | Status: DC | PRN

## 2017-09-24 MED ORDER — ENOXAPARIN SODIUM 40 MG/0.4ML ~~LOC~~ SOLN: 40.0000 mg | SUBCUTANEOUS | 0 refills | Status: DC

## 2017-09-24 NOTE — Progress Notes (Signed)
Physical Therapy Treatment Patient Details Name: Dana Morton MRN: [id number] DOB: 1950-11-13 Today's Date: 09/24/2017    History of Present Illness Pt admitted for R TKR. Pt is POD 1 at time of evaluation. History includes GERD, HTN, fibrocystic breast dx, and claustrophobia.     PT Comments    Pt is making good progress towards goals with improved gait distance this session. Pt able to perform there-ex with good technique and continues to be motivated to perform therapy. Will continue to progress. Needs to perform stair training prior to discharge.   Follow Up Recommendations  Home health PT     Equipment Recommendations  Rolling walker with 5" wheels;3in1 (PT)    Recommendations for Other Services       Precautions / Restrictions Precautions Precautions: Fall;Knee Precaution Booklet Issued: No Restrictions Weight Bearing Restrictions: Yes RLE Weight Bearing: Weight bearing as tolerated    Mobility  Bed Mobility Overal bed mobility: Needs Assistance Bed Mobility: Sit to Supine       Sit to supine: Min guard   General bed mobility comments: guidance given for bringing B LEs up on bed. Able to reposition self once in bed  Transfers Overall transfer level: Needs assistance Equipment used: Rolling walker (2 wheeled) Transfers: Sit to/from Stand Sit to Stand: Min guard         General transfer comment: cues for hand placement. Once standing, able to stand upright  Ambulation/Gait Ambulation/Gait assistance: Min guard Ambulation Distance (Feet): 85 Feet Assistive device: Rolling walker (2 wheeled) Gait Pattern/deviations: Step-through pattern     General Gait Details: ambulated with smooth reciprocal gait and fluid pattern. Able to demonstrate upright posture, however limited by fatigue and pain. Unable to carry conversation during mobility as she is focused on breathing.   Stairs            Wheelchair Mobility    Modified Rankin (Stroke  Patients Only)       Balance Overall balance assessment: Needs assistance Sitting-balance support: Feet supported Sitting balance-Leahy Scale: Good     Standing balance support: Bilateral upper extremity supported Standing balance-Leahy Scale: Good                              Cognition Arousal/Alertness: Awake/alert Behavior During Therapy: WFL for tasks assessed/performed Overall Cognitive Status: Within Functional Limits for tasks assessed                                        Exercises Other Exercises Other Exercises: Pt instructed in polar care mgt, compression stocking mgt, and falls prevention strategies to maximize safety and independence during recovery Other Exercises: supine ther-ex performed on R LE including ankle pumps, quad sets, glut sets, SLRs, hip abd/add x 10 reps x cga.    General Comments General comments (skin integrity, edema, etc.): polar care in place at start and end of session      Pertinent Vitals/Pain Pain Assessment: Faces Pain Score: 5  Faces Pain Scale: Hurts little more Pain Location: R knee Pain Descriptors / Indicators: Operative site guarding Pain Intervention(s): Limited activity within patient's tolerance;Ice applied    Home Living Family/patient expects to be discharged to:: Private residence Living Arrangements: Spouse/significant other Available Help at Discharge: Family Type of Home: House Home Access: Stairs to enter Entrance Stairs-Rails: Can reach both Home Layout: One level Home  Equipment: Gilford Rile - 4 wheels;Shower seat - built in;Grab bars - tub/shower;Grab bars - toilet;Adaptive equipment      Prior Function Level of Independence: Independent with assistive device(s)      Comments: was using rollater for mobility, no falls, indep with ADL   PT Goals (current goals can now be found in the care plan section) Acute Rehab PT Goals Patient Stated Goal: to get stronger PT Goal Formulation:  With patient Time For Goal Achievement: 10/08/17 Potential to Achieve Goals: Good Progress towards PT goals: Progressing toward goals    Frequency    BID      PT Plan Current plan remains appropriate    Co-evaluation              AM-PAC PT "6 Clicks" Daily Activity  Outcome Measure  Difficulty turning over in bed (including adjusting bedclothes, sheets and blankets)?: Unable Difficulty moving from lying on back to sitting on the side of the bed? : Unable Difficulty sitting down on and standing up from a chair with arms (e.g., wheelchair, bedside commode, etc,.)?: Unable Help needed moving to and from a bed to chair (including a wheelchair)?: A Little Help needed walking in hospital room?: A Little Help needed climbing 3-5 steps with a railing? : A Lot 6 Click Score: 11    End of Session Equipment Utilized During Treatment: Gait belt Activity Tolerance: Patient tolerated treatment well Patient left: in bed;with bed alarm set Nurse Communication: Mobility status PT Visit Diagnosis: Muscle weakness (generalized) (M62.81);Difficulty in walking, not elsewhere classified (R26.2);Pain Pain - Right/Left: Right Pain - part of body: Knee     Time: 1321-1346 PT Time Calculation (min) (ACUTE ONLY): 25 min  Charges:  $Gait Training: 8-22 mins $Therapeutic Exercise: 8-22 mins                    G Codes:       Dana Morton, PT, DPT [phone]    Dana Morton 09/24/2017, 3:30 PM

## 2017-09-24 NOTE — Progress Notes (Signed)
Pt refusing bone foam. Attempted to dangle pt but declined.

## 2017-09-24 NOTE — Evaluation (Signed)
Physical Therapy Evaluation Patient Details Name: Dana Morton MRN: [id number] DOB: [birth year] Today's Date: 09/24/2017   History of Present Illness  Pt admitted for R TKR. Pt is POD 1 at time of evaluation. History includes GERD, HTN, fibrocystic breast dx, and claustrophobia.   Clinical Impression  Pt is a pleasant 67 year old female who was admitted for R TKR. Pt performs bed mobility/transfers with min assist and ambulation with cga and RW. Pt demonstrates ability to perform 10 SLRs with independence, therefore does not require KI for mobility. Pt demonstrates deficits with strength/ROM/mobility/pain. Appears motivated to perform therapy. Would benefit from skilled PT to address above deficits and promote optimal return to PLOF. Recommend transition to [facility] upon discharge from acute hospitalization.       Follow Up Recommendations Home health PT    Equipment Recommendations  Rolling walker with 5" wheels;3in1 (PT)    Recommendations for Other Services       Precautions / Restrictions Precautions Precautions: Fall;Knee Precaution Booklet Issued: No Restrictions Weight Bearing Restrictions: Yes RLE Weight Bearing: Weight bearing as tolerated      Mobility  Bed Mobility Overal bed mobility: Needs Assistance Bed Mobility: Supine to Sit     Supine to sit: Min assist     General bed mobility comments: needs assist for guidance of R LE down to floor. Needs slight assist for trunk support. Once seated at EOB, able to sit with upright posture. No dizziness noted  Transfers Overall transfer level: Needs assistance Equipment used: Rolling walker (2 wheeled) Transfers: Sit to/from Stand Sit to Stand: Min assist         General transfer comment: safe technique with cues for sequencing. Once standing, able to tolerate WBing on surgical leg  Ambulation/Gait Ambulation/Gait assistance: Min guard Ambulation Distance (Feet): 45 Feet Assistive device: Rolling walker (2  wheeled) Gait Pattern/deviations: Step-through pattern     General Gait Details: ambulated using RW and verbal cues for sequencing. Step to gait pattern performed with slow speed. Pt follows commands well, however fatigues with increased distance.  Stairs            Wheelchair Mobility    Modified Rankin (Stroke Patients Only)       Balance Overall balance assessment: Needs assistance Sitting-balance support: Feet supported Sitting balance-Leahy Scale: Good     Standing balance support: Bilateral upper extremity supported Standing balance-Leahy Scale: Good                               Pertinent Vitals/Pain Pain Assessment: Faces Faces Pain Scale: Hurts little more Pain Location: R knee Pain Descriptors / Indicators: Operative site guarding Pain Intervention(s): Limited activity within patient's tolerance;Monitored during session;Premedicated before session;Repositioned;Ice applied    Home Living Family/patient expects to be discharged to:: Private residence Living Arrangements: Spouse/significant other Available Help at Discharge: Family Type of Home: House Home Access: Stairs to enter Entrance Stairs-Rails: Can reach both Entrance Stairs-Number of Steps: 3 Home Layout: One level Home Equipment: Environmental consultant - 4 wheels      Prior Function Level of Independence: Independent with assistive device(s)         Comments: was using rollater for mobility     Hand Dominance        Extremity/Trunk Assessment   Upper Extremity Assessment Upper Extremity Assessment: Generalized weakness(B UE grossly 4/5)    Lower Extremity Assessment Lower Extremity Assessment: Generalized weakness(R LE grossly 3/5; L LE grossly 4/5)  Communication   Communication: No difficulties  Cognition Arousal/Alertness: Awake/alert Behavior During Therapy: WFL for tasks assessed/performed Overall Cognitive Status: Within Functional Limits for tasks assessed                                         General Comments      Exercises Total Joint Exercises Goniometric ROM: R knee AAROM: 8-82 degrees; encouraged bone foam; not placed upon arrival Other Exercises Other Exercises: Pt performed supine ther-ex on R LE including ankle pumps, quad sets, SLRs, hip abd/add, and knee flexion stretches. All ther-ex performed x 10 reps with min assist and correct technique.    Assessment/Plan    PT Assessment Patient needs continued PT services  PT Problem List Decreased strength;Decreased range of motion;Decreased activity tolerance;Decreased balance;Decreased mobility;Pain       PT Treatment Interventions DME instruction;Gait training;Stair training;Therapeutic exercise    PT Goals (Current goals can be found in the Care Plan section)  Acute Rehab PT Goals Patient Stated Goal: to get stronger PT Goal Formulation: With patient Time For Goal Achievement: 10/08/17 Potential to Achieve Goals: Good    Frequency BID   Barriers to discharge        Co-evaluation               AM-PAC PT "6 Clicks" Daily Activity  Outcome Measure Difficulty turning over in bed (including adjusting bedclothes, sheets and blankets)?: Unable Difficulty moving from lying on back to sitting on the side of the bed? : Unable Difficulty sitting down on and standing up from a chair with arms (e.g., wheelchair, bedside commode, etc,.)?: Unable Help needed moving to and from a bed to chair (including a wheelchair)?: A Little Help needed walking in hospital room?: A Little Help needed climbing 3-5 steps with a railing? : A Lot 6 Click Score: 11    End of Session Equipment Utilized During Treatment: Gait belt Activity Tolerance: Patient tolerated treatment well Patient left: in chair;with chair alarm set;with SCD's reapplied Nurse Communication: Mobility status PT Visit Diagnosis: Muscle weakness (generalized) (M62.81);Difficulty in walking, not elsewhere  classified (R26.2);Pain Pain - Right/Left: Right Pain - part of body: Knee    Time: 0903-0929 PT Time Calculation (min) (ACUTE ONLY): 26 min   Charges:   PT Evaluation $PT Eval Low Complexity: 1 Low PT Treatments $Therapeutic Exercise: 8-22 mins   PT G Codes:        Greggory Stallion, PT, DPT [phone]   Tykee Heideman 09/24/2017, 11:12 AM

## 2017-09-24 NOTE — Progress Notes (Signed)
   Subjective: 1 Day Post-Op Procedure(s) (LRB): COMPUTER ASSISTED TOTAL KNEE ARTHROPLASTY (Right) Patient reports pain as 5 on 0-10 scale.   Patient is well, and has had no acute complaints or problems We will start therapy today.  Therapy was not initiated yesterday secondary toTherapy was not initiated yesterday secondary to decreased sensation.  No bed exercises were performed secondary to decreased sensation.  Patient refused to dangle leg off side of bed with nurse last evening Plan is to go Home after hospital stay. no nausea and no vomiting Patient denies any chest pains or shortness of breath. Objective: Vital signs in last 24 hours: Temp:  [97.4 F (36.3 C)-98.1 F (36.7 C)] 98.1 F (36.7 C) (03/14 0448) Pulse Rate:  [63-81] 65 (03/14 0448) Resp:  [15-19] 19 (03/14 0448) BP: (98-140)/(46-81) 122/52 (03/14 0448) SpO2:  [92 %-100 %] 95 % (03/14 0448) Heels are non tender and elevated off the bed using rolled towels Pt refusing to use the bone foam  Intake/Output from previous day: 03/13 0701 - 03/14 0700 In: 2260 [P.O.:960; I.V.:1100] Out: 2430 [Urine:2250; Drains:130; Blood:50] Intake/Output this shift: No intake/output data recorded.  No results for input(s): HGB in the last 72 hours. No results for input(s): WBC, RBC, HCT, PLT in the last 72 hours. No results for input(s): NA, K, CL, CO2, BUN, CREATININE, GLUCOSE, CALCIUM in the last 72 hours. No results for input(s): LABPT, INR in the last 72 hours.  EXAM General - Patient is Alert, Appropriate and Oriented Extremity - Neurologically intact Neurovascular intact Sensation intact distally Intact pulses distally Dorsiflexion/Plantar flexion intact No cellulitis present Compartment soft Dressing - dressing C/D/I Motor Function - intact, moving foot and toes well on exam. Able to do SLR on own  Past Medical History:  Diagnosis Date  . Anemia    h/o   . Arthritis   . Claustrophobia   . Diverticulosis   .  Fibrocystic breast disease (FCBD)   . Fibrocystic disease of breast   . GERD (gastroesophageal reflux disease)   . Herpes simplex   . HLD (hyperlipidemia)   . Hypercholesteremia   . Hyperlipidemia   . Hypertension   . Hypothyroidism   . Pre-diabetes   . Pulmonary nodules   . Pure hypercholesterolemia   . Rotator cuff tear    LEFT SHOULDER  . Sleep apnea    no cpap  . Vitamin D deficiency   . Vitamin D deficiency     Assessment/Plan: 1 Day Post-Op Procedure(s) (LRB): COMPUTER ASSISTED TOTAL KNEE ARTHROPLASTY (Right) Active Problems:   S/P total knee arthroplasty  Estimated body mass index is 41.55 kg/m as calculated from the following:   Height as of 09/09/17: 5\' 6"  (1.676 m).   Weight as of 09/09/17: 116.8 kg (257 lb 6.4 oz). Advance diet Up with therapy D/C IV fluids Plan for discharge tomorrow Discharge home with home health  Labs: none DVT Prophylaxis - Lovenox, Foot Pumps and TED hose Weight-Bearing as tolerated to right leg D/C O2 and Pulse OX and try on Room Air Begin working on a bowel movement  Dana Morton R. [place] [facility] [facility] 09/24/2017, 7:16 AM

## 2017-09-24 NOTE — Discharge Summary (Signed)
Physician Discharge Summary  Patient ID: Dana Morton MRN: [id number] DOB/AGE: June 22, 1951 67 y.o.  Admit date: 09/23/2017 Discharge date: 09/25/2017  Admission Diagnoses:  PRIMARY OSTEOARTHRITIS OF RIGHT KNEE   Discharge Diagnoses: Patient Active Problem List   Diagnosis Date Noted  . S/P total knee arthroplasty 09/23/2017  . Diabetes mellitus type 2, uncomplicated ([facility]) 96/75/9163  . Hypertension 09/22/2017  . Hypothyroidism, unspecified 09/22/2017  . Vitamin D deficiency, unspecified 09/22/2017  . Edema of both legs 03/03/2017  . BMI 40.0-44.9, adult ([facility]) 12/03/2015  . Pure hypercholesterolemia 05/26/2014    Past Medical History:  Diagnosis Date  . Anemia    h/o   . Arthritis   . Claustrophobia   . Diverticulosis   . Fibrocystic breast disease (FCBD)   . Fibrocystic disease of breast   . GERD (gastroesophageal reflux disease)   . Herpes simplex   . HLD (hyperlipidemia)   . Hypercholesteremia   . Hyperlipidemia   . Hypertension   . Hypothyroidism   . Pre-diabetes   . Pulmonary nodules   . Pure hypercholesterolemia   . Rotator cuff tear    LEFT SHOULDER  . Sleep apnea    no cpap  . Vitamin D deficiency   . Vitamin D deficiency      Transfusion: none   Consultants (if any):   Discharged Condition: Improved  Hospital Course: Dana Morton is an 67 y.o. female who was admitted 09/23/2017 with a diagnosis of degenerative arthrosis to right knee and went to the operating room on 09/23/2017 and underwent the above named procedures.    Surgeries:Procedure(s): COMPUTER ASSISTED TOTAL KNEE ARTHROPLASTY on 09/23/2017  PRE-OPERATIVE DIAGNOSIS: Degenerative arthrosis of the right knee, primary  POST-OPERATIVE DIAGNOSIS:  Same  PROCEDURE:  Right total knee arthroplasty using computer-assisted navigation  SURGEON:  Marciano Sequin. M.D.  ASSISTANT:  Vance Peper, PA (present and scrubbed throughout the case, critical for assistance with exposure,  retraction, instrumentation, and closure)  ANESTHESIA: spinal  ESTIMATED BLOOD LOSS: 50 mL  FLUIDS REPLACED: 1100 mL of crystalloid  TOURNIQUET TIME: 97 minutes  DRAINS: 2 medium Hemovac drains  SOFT TISSUE RELEASES: Anterior cruciate ligament, posterior cruciate ligament, deep medial collateral ligament, patellofemoral ligament  IMPLANTS UTILIZED: DePuy Attune size 5N posterior stabilized femoral component (cemented), size 4 rotating platform tibial component (cemented), 35 mm medialized dome patella (cemented), and a 5 mm stabilized rotating platform polyethylene insert.  INDICATIONS FOR SURGERY: Dana Morton is a 67 y.o. year old female with a long history of progressive knee pain. X-rays demonstrated severe degenerative changes in tricompartmental fashion. The patient had not seen any significant improvement despite conservative nonsurgical intervention. After discussion of the risks and benefits of surgical intervention, the patient expressed understanding of the risks benefits and agree with plans for total knee arthroplasty.   The risks, benefits, and alternatives were discussed at length including but not limited to the risks of infection, bleeding, nerve injury, stiffness, blood clots, the need for revision surgery, cardiopulmonary complications, among others, and they were willing to proceed.   Patient tolerated the surgery well. No complications .Patient was taken to PACU where she was stabilized and then transferred to the orthopedic floor.  Patient started on Lovenox 30 q 12 hrs. Foot pumps applied bilaterally at 80 mm hgb. Heels elevated off bed with rolled towels. No evidence of DVT. Calves non tender. Negative Homan. Physical therapy started on day #1 for gait training and transfer with OT starting on  day #1 for ADL  and assisted devices. Patient has done well with therapy. Ambulated greater than 200  feet upon being discharged. Was able to ascend and descend four  steps safely and independently  Patient's IV And Foley were discontinued on day #1 with Hemovac being discontinued on day #2. Dressing was changed on day 2 prior to patient being discharged   She was given perioperative antibiotics:  Anti-infectives (From admission, onward)   Start     Dose/Rate Route Frequency Ordered Stop   09/23/17 1400  ceFAZolin (ANCEF) IVPB 2g/100 mL premix     2 g 200 mL/hr over 30 Minutes Intravenous Every 6 hours 09/23/17 1218 09/24/17 1359   09/23/17 0634  ceFAZolin (ANCEF) 2-4 GM/100ML-% IVPB    Comments:  Phineas Real   : cabinet override      09/23/17 0634 09/23/17 1844   09/23/17 0600  ceFAZolin (ANCEF) IVPB 2g/100 mL premix  Status:  Discontinued     2 g 200 mL/hr over 30 Minutes Intravenous On call to O.R. 09/22/17 2220 09/23/17 2694    .  She was fitted with AV 1 compression foot pump devices, instructed on heel pumps, early ambulation, and fitted with TED stockings bilaterally for DVT prophylaxis.  She benefited maximally from the hospital stay and there were no complications.    Recent vital signs:  Vitals:   09/24/17 0002 09/24/17 0448  BP: (!) 131/55 (!) 122/52  Pulse: 71 65  Resp:  19  Temp: 98.1 F (36.7 C) 98.1 F (36.7 C)  SpO2: 96% 95%    Recent laboratory studies:  Lab Results  Component Value Date   HGB 13.8 09/09/2017   HGB 14.1 01/16/2017   HGB 13.5 05/31/2016   Lab Results  Component Value Date   WBC 7.8 09/09/2017   PLT 226 09/09/2017   Lab Results  Component Value Date   INR 1.04 09/09/2017   Lab Results  Component Value Date   NA 139 09/09/2017   K 3.7 09/09/2017   CL 103 09/09/2017   CO2 27 09/09/2017   BUN 36 (H) 09/09/2017   CREATININE 1.01 (H) 09/09/2017   GLUCOSE 103 (H) 09/09/2017    Discharge Medications:   Allergies as of 09/24/2017      Reactions   Celebrex [celecoxib] Nausea Only   Codeine Nausea Only      Medication List    TAKE these medications   bisoprolol 10 MG tablet Commonly  known as:  ZEBETA Take 5 mg by mouth daily.   Black Cohosh 540 MG Caps Take 540 mg by mouth daily.   CALCIUM 600+D PO Take 1 tablet by mouth daily.   cetirizine 10 MG tablet Commonly known as:  ZYRTEC Take 10 mg by mouth daily.   D3-1000 1000 units tablet Generic drug:  Cholecalciferol Take 1,000 Units by mouth daily.   enoxaparin 40 MG/0.4ML injection Commonly known as:  LOVENOX Inject 0.4 mLs (40 mg total) into the skin daily for 14 days. Start taking on:  09/26/2017   gabapentin 300 MG capsule Commonly known as:  NEURONTIN Take 300 mg by mouth at bedtime.   hydrochlorothiazide 25 MG tablet Commonly known as:  HYDRODIURIL Take 25 mg by mouth daily.   HYDROcodone-acetaminophen 10-325 MG tablet Commonly known as:  NORCO Take 1-2 tablets by mouth 2 (two) times daily as needed. For severe back pain.   levothyroxine 125 MCG tablet Commonly known as:  SYNTHROID, LEVOTHROID Take 125 mcg by mouth daily before breakfast.   lisinopril 40 MG tablet  Commonly known as:  PRINIVIL,ZESTRIL Take 40 mg by mouth at bedtime.   lovastatin 40 MG tablet Commonly known as:  MEVACOR Take 40 mg by mouth at bedtime.   Magnesium 500 MG Caps Take 500 mg by mouth daily.   meloxicam 15 MG tablet Commonly known as:  MOBIC Take 1 tablet by mouth daily.   multivitamin with minerals Tabs tablet Take 1 tablet by mouth daily. Centrum Silver for Women   NON FORMULARY Take 800 mg by mouth 2 (two) times daily as needed (KETO ADVANCED WEIGHT LOSS).   omeprazole 20 MG capsule Commonly known as:  PRILOSEC Take 20 mg by mouth daily before breakfast.   oxyCODONE 5 MG immediate release tablet Commonly known as:  Oxy IR/ROXICODONE Take 1 tablet (5 mg total) by mouth every 4 (four) hours as needed for moderate pain (pain score 4-6).   traMADol 50 MG tablet Commonly known as:  ULTRAM Take 1-2 tablets (50-100 mg total) by mouth every 4 (four) hours as needed for moderate pain.             Durable Medical Equipment  (From admission, onward)        Start     Ordered   09/23/17 1219  DME Walker rolling  Once    Question:  Patient needs a walker to treat with the following condition  Answer:  Total knee replacement status   09/23/17 1218   09/23/17 1219  DME Bedside commode  Once    Question:  Patient needs a bedside commode to treat with the following condition  Answer:  Total knee replacement status   09/23/17 1218      Diagnostic Studies: Dg Knee Right Port  Result Date: 09/23/2017 CLINICAL DATA:  Followup total knee replacement. EXAM: PORTABLE RIGHT KNEE - 1-2 VIEW COMPARISON:  None FINDINGS: Two-view show total knee replacement. Components appear well positioned. Drains in the suprapatellar region. Screw defects in the proximal tibial diaphysis and the distal femoral diaphysis. No unexpected finding. IMPRESSION: Good appearance following total knee replacement. Electronically Signed   By: Nelson Chimes M.D.   On: 09/23/2017 11:33    Disposition: 01-Home or Self Care  Discharge Instructions    Increase activity slowly   Complete by:  As directed       Follow-up Information    Watt Climes, PA On 10/08/2017.   Specialty:  Physician Assistant Why:  at 1:15pm Contact information: [place] [address] [phone]        Dereck Leep, MD On 11/05/2017.   Specialty:  Orthopedic Surgery Why:  at 10:45am Contact information: [place] [address] [phone]            Signed: Watt Climes 09/24/2017, 7:25 AM

## 2017-09-24 NOTE — Anesthesia Postprocedure Evaluation (Signed)
Anesthesia Post Note  Patient: Dana Morton  Procedure(s) Performed: COMPUTER ASSISTED TOTAL KNEE ARTHROPLASTY (Right )  Patient location during evaluation: Nursing Unit Anesthesia Type: Spinal Level of consciousness: awake, awake and alert and oriented Pain management: pain level controlled Vital Signs Assessment: post-procedure vital signs reviewed and stable Respiratory status: spontaneous breathing, nonlabored ventilation and respiratory function stable Cardiovascular status: blood pressure returned to baseline and stable Postop Assessment: no headache and no backache Anesthetic complications: no     Last Vitals:  Vitals:   09/24/17 0002 09/24/17 0448  BP: (!) 131/55 (!) 122/52  Pulse: 71 65  Resp:  19  Temp: 36.7 C 36.7 C  SpO2: 96% 95%    Last Pain:  Vitals:   09/24/17 0448  TempSrc: Oral  PainSc:                  [name]

## 2017-09-24 NOTE — Evaluation (Signed)
Occupational Therapy Evaluation Patient Details Name: Dana Morton MRN: [id number] DOB: Jul 08, 1951 Today's Date: 09/24/2017    History of Present Illness Pt admitted for R TKR. Pt is POD 1 at time of evaluation. History includes GERD, HTN, fibrocystic breast dx, and claustrophobia.    Clinical Impression   Pt is s/p R TKR, POD#1.  Pt was independent in all ADLs and mobility prior to surgery and is eager to return to PLOF.  Pt currently requires minimal assist for LB dressing while in seated position due to pain and limited AROM of R knee. Pt instructed in AE for LB ADL, polar care mgt, compression stocking mgt, falls prevention, and home/routines modifications to maximize safety and independence during recovery. Pt verbalized understanding of all education and training provided and expressed appreciation for knowledge gained. Verbalizes plan for spouse/family to assist as needed at home. Do not anticipate any further skilled OT needs at this time. Will sign off. Please re-consult if additional needs arise.     Follow Up Recommendations  No OT follow up    Equipment Recommendations  3 in 1 bedside commode    Recommendations for Other Services       Precautions / Restrictions Precautions Precautions: Fall;Knee Precaution Booklet Issued: No Restrictions Weight Bearing Restrictions: Yes RLE Weight Bearing: Weight bearing as tolerated      Mobility Bed Mobility     General bed mobility comments: deferred, up in recliner  Transfers Overall transfer level: Needs assistance Equipment used: Rolling walker (2 wheeled) Transfers: Sit to/from Stand Sit to Stand: Min assist;Min guard         General transfer comment: VC for hand placement    Balance Overall balance assessment: Needs assistance Sitting-balance support: Feet supported Sitting balance-Leahy Scale: Good     Standing balance support: Bilateral upper extremity supported Standing balance-Leahy Scale: Good                              ADL either performed or assessed with clinical judgement   ADL Overall ADL's : Needs assistance/impaired Eating/Feeding: Sitting;Independent   Grooming: Standing;Min guard   Upper Body Bathing: Sitting;Supervision/ safety   Lower Body Bathing: Sit to/from stand;Minimal assistance   Upper Body Dressing : Sitting;Supervision/safety   Lower Body Dressing: Sit to/from stand;Minimal assistance Lower Body Dressing Details (indicate cue type and reason): instructed in AE for LB dressing to improve independence (has reacher but has not ever used for LB dressing; has sock aid at home) Toilet Transfer: RW;Min guard;Ambulation;Comfort height toilet           Functional mobility during ADLs: Min guard;Rolling walker       Vision Baseline Vision/History: Wears glasses Wears Glasses: At all times Patient Visual Report: No change from baseline       Perception     Praxis      Pertinent Vitals/Pain Pain Assessment: 0-10 Pain Score: 5  Faces Pain Scale: Hurts little more Pain Location: R knee Pain Descriptors / Indicators: Operative site guarding Pain Intervention(s): Limited activity within patient's tolerance;Monitored during session;Ice applied     Hand Dominance     Extremity/Trunk Assessment Upper Extremity Assessment Upper Extremity Assessment: Generalized weakness(grossly 4/5 bilaterally)   Lower Extremity Assessment Lower Extremity Assessment: Generalized weakness;Defer to PT evaluation   Cervical / Trunk Assessment Cervical / Trunk Assessment: Normal   Communication Communication Communication: No difficulties   Cognition Arousal/Alertness: Awake/alert Behavior During Therapy: WFL for tasks assessed/performed Overall Cognitive  Status: Within Functional Limits for tasks assessed                                     General Comments  polar care in place at start and end of session    Exercises Other  Exercises Other Exercises: Pt instructed in polar care mgt, compression stocking mgt, and falls prevention strategies to maximize safety and independence during recovery   Shoulder Instructions      Home Living Family/patient expects to be discharged to:: Private residence Living Arrangements: Spouse/significant other Available Help at Discharge: Family Type of Home: House Home Access: Stairs to enter Technical brewer of Steps: 3 Entrance Stairs-Rails: Can reach both [place]: One level     Bathroom Shower/Tub: Occupational psychologist: Handicapped height     Home Equipment: Environmental consultant - 4 wheels;Shower seat - built in;Grab bars - tub/shower;Grab bars - toilet;Adaptive equipment Adaptive Equipment: Reacher;Sock aid        Prior Functioning/Environment Level of Independence: Independent with assistive device(s)        Comments: was using rollater for mobility, no falls, indep with ADL        OT Problem List:        OT Treatment/Interventions:      OT Goals(Current goals can be found in the care plan section) Acute Rehab OT Goals Patient Stated Goal: to get stronger OT Goal Formulation: All assessment and education complete, DC therapy  OT Frequency:     Barriers to D/C:            Co-evaluation              AM-PAC PT "6 Clicks" Daily Activity     Outcome Measure Help from another person eating meals?: None Help from another person taking care of personal grooming?: None Help from another person toileting, which includes using toliet, bedpan, or urinal?: A Little Help from another person bathing (including washing, rinsing, drying)?: A Little Help from another person to put on and taking off regular upper body clothing?: None Help from another person to put on and taking off regular lower body clothing?: A Little 6 Click Score: 21   End of Session    Activity Tolerance: Patient tolerated treatment well Patient left: in chair;with call  bell/phone within reach;with chair alarm set;Other (comment)(polar care in place)  OT Visit Diagnosis: Other abnormalities of gait and mobility (R26.89)                Time: 2641-5830 OT Time Calculation (min): 15 min Charges:  OT General Charges $OT Visit: 1 Visit OT Evaluation $OT Eval Low Complexity: 1 Low OT Treatments $Self Care/Home Management : 8-22 mins  Jeni Salles, MPH, MS, OTR/L ascom [phone] 09/24/17, 12:51 PM

## 2017-09-24 NOTE — Care Management Note (Signed)
Case Management Note  Patient Details  Name: Dana Morton MRN: [id number] Date of Birth: March 27, 1951  Subjective/Objective:   POD # 1 right total knee arthroplasty. Met with patient and her spouse at bedside. Pt recommending home health PT. Offered choice of agencies. Referral to Sonia [facility] at [facility] for [place]. Will need a rolling walker. Ordered from [place] with [facility]. Pharmacy: [facility]: [phone]. Will check price of Lovenox prior to discharge.                   Action/Plan: [facility] for HHPT. Walker from [place].   Expected Discharge Date:                  Expected Discharge Plan:  [place]  In-House Referral:     Discharge planning Services  CM Consult  Post Acute Care Choice:  Durable Medical Equipment, Home Health Choice offered to:  Patient  DME Arranged:  Walker rolling DME Agency:  [place]:  PT [place] Agency:  [facility]  Status of Service:  In process, will continue to follow  If discussed at Long Length of Stay Meetings, dates discussed:    Additional Comments:  Jolly Mango, RN 09/24/2017, 10:11 AM

## 2017-09-24 NOTE — Progress Notes (Signed)
Clinical Social Worker (CSW) received SNF consult. PT is recommending home health. RN case manager aware of above. Please reconsult if future social work needs arise. CSW signing off.   Amory Simonetti, LCSW [phone] 

## 2017-09-25 NOTE — Progress Notes (Signed)
Physical Therapy Treatment Patient Details Name: Dana Morton MRN: [id number] DOB: 1951/02/20 Today's Date: 09/25/2017    History of Present Illness Pt admitted for R TKR. Pt is POD 1 at time of evaluation. History includes GERD, HTN, fibrocystic breast dx, and claustrophobia.     PT Comments    Pt is making good progress towards goals and is safe to dc this date to home environment. Pt with good progress with ROM and able to perform ambulation around RN station as well as stair training. Pt does fatigue with increased mobility, needs standing rest breaks. Safe technique with RW. Very motivated to participate.    Follow Up Recommendations  Home health PT     Equipment Recommendations  (has equipment)    Recommendations for Other Services       Precautions / Restrictions Precautions Precautions: Fall;Knee Precaution Booklet Issued: Yes (comment) Restrictions Weight Bearing Restrictions: Yes RLE Weight Bearing: Weight bearing as tolerated    Mobility  Bed Mobility Overal bed mobility: Needs Assistance Bed Mobility: Supine to Sit     Supine to sit: Min assist     General bed mobility comments: needs assist for scooting B LE off bed and trunk support. Once seated at EOB, able to sit with upright posture.  Transfers Overall transfer level: Needs assistance Equipment used: Rolling walker (2 wheeled) Transfers: Sit to/from Stand Sit to Stand: Min guard         General transfer comment: bed elevated. Cues for hand placement. Upright posture noted.  Ambulation/Gait Ambulation/Gait assistance: Min guard Ambulation Distance (Feet): 250 Feet Assistive device: Rolling walker (2 wheeled) Gait Pattern/deviations: Step-through pattern     General Gait Details: ambulated with step to gait, progressing to reciprocal gait pattern. Upright posture noted and fluid sequencing. 3 standing rest breaks required due to B UE fatigue.   Stairs Stairs: Yes   Stair Management:  Two rails Number of Stairs: 4 General stair comments: Pt able to ascend/descent safely with sequencing given. Therapist demonstrated prior to performance. Pt also practiced one steps with 1 rail and HHA, however stairs were not wide enough for therapist and pt to ascend more than 1 step. Pt and husband present and feel confident with training  Wheelchair Mobility    Modified Rankin (Stroke Patients Only)       Balance                                            Cognition Arousal/Alertness: Awake/alert Behavior During Therapy: WFL for tasks assessed/performed Overall Cognitive Status: Within Functional Limits for tasks assessed                                        Exercises Total Joint Exercises Goniometric ROM: R knee AAROM: 0-92 degrees Other Exercises Other Exercises: supine ther-ex performed on R LE including ankle pumps, quad sets, glut sets, SLRs, hip abd/add , SAQ, and knee flexion stretches x 12 reps x cga. Written HEP reviewed    General Comments        Pertinent Vitals/Pain Pain Assessment: Faces Faces Pain Scale: Hurts little more Pain Location: R knee Pain Descriptors / Indicators: Operative site guarding Pain Intervention(s): Limited activity within patient's tolerance;Premedicated before session;Ice applied    Home Living  Prior Function            PT Goals (current goals can now be found in the care plan section) Acute Rehab PT Goals Patient Stated Goal: to get stronger PT Goal Formulation: With patient Time For Goal Achievement: 10/08/17 Potential to Achieve Goals: Good Progress towards PT goals: Progressing toward goals    Frequency    BID      PT Plan Current plan remains appropriate    Co-evaluation              AM-PAC PT "6 Clicks" Daily Activity  Outcome Measure  Difficulty turning over in bed (including adjusting bedclothes, sheets and blankets)?:  Unable Difficulty moving from lying on back to sitting on the side of the bed? : Unable Difficulty sitting down on and standing up from a chair with arms (e.g., wheelchair, bedside commode, etc,.)?: Unable Help needed moving to and from a bed to chair (including a wheelchair)?: A Little Help needed walking in hospital room?: A Little Help needed climbing 3-5 steps with a railing? : A Little 6 Click Score: 12    End of Session Equipment Utilized During Treatment: Gait belt Activity Tolerance: Patient tolerated treatment well Patient left: in chair;with chair alarm set Nurse Communication: Mobility status PT Visit Diagnosis: Muscle weakness (generalized) (M62.81);Difficulty in walking, not elsewhere classified (R26.2);Pain Pain - Right/Left: Right Pain - part of body: Knee     Time: 2707-8675 PT Time Calculation (min) (ACUTE ONLY): 40 min  Charges:  $Gait Training: 23-37 mins $Therapeutic Exercise: 8-22 mins                    G Codes:       Greggory Stallion, PT, DPT [phone]    Avalina Benko 09/25/2017, 11:55 AM

## 2017-09-25 NOTE — Care Management Note (Signed)
Case Management Note  Patient Details  Name: CECILLIA MENEES MRN: [id number] Date of Birth: 30-Aug-1950  Subjective/Objective:  Discharging today                 Action/Plan: [facility] notified of discharge. Cost of Lovenox is $ 10.00. Patient has picked it up. DME delivered.   Expected Discharge Date:  09/25/17               Expected Discharge Plan:  [place]  In-House Referral:     Discharge planning Services  CM Consult  Post Acute Care Choice:  Durable Medical Equipment, Home Health Choice offered to:  Patient  DME Arranged:  Walker rolling DME Agency:  [place]:  PT [place] Agency:  [facility]  Status of Service:  Completed, signed off  If discussed at [employer] of Stay Meetings, dates discussed:    Additional Comments:  Jolly Mango, RN 09/25/2017, 8:35 AM

## 2017-09-25 NOTE — Progress Notes (Signed)
   Subjective: 2 Days Post-Op Procedure(s) (LRB): COMPUTER ASSISTED TOTAL KNEE ARTHROPLASTY (Right) Patient reports pain as 8 on 0-10 scale.   Patient is well, and has had no acute complaints or problems Patient did well with physical therapy yesterday.  Was able to ambulate 85 feet. Plan is to go Home after hospital stay. no nausea and no vomiting Patient denies any chest pains or shortness of breath. Objective: Vital signs in last 24 hours: Temp:  [97.8 F (36.6 C)-98.3 F (36.8 C)] 98.1 F (36.7 C) (03/15 0455) Pulse Rate:  [56-72] 72 (03/15 0455) Resp:  [18] 18 (03/15 0455) BP: (126-147)/(52-69) 126/60 (03/15 0455) SpO2:  [95 %-99 %] 95 % (03/15 0455) well approximated incision Heels are non tender and elevated off the bed using rolled towels Intake/Output from previous day: 03/14 0701 - 03/15 0700 In: 1712 [P.O.:960; I.V.:752] Out: -  Intake/Output this shift: No intake/output data recorded.  No results for input(s): HGB in the last 72 hours. No results for input(s): WBC, RBC, HCT, PLT in the last 72 hours. No results for input(s): NA, K, CL, CO2, BUN, CREATININE, GLUCOSE, CALCIUM in the last 72 hours. No results for input(s): LABPT, INR in the last 72 hours.  EXAM General - Patient is Alert, Appropriate and Oriented Extremity - Neurologically intact Neurovascular intact Sensation intact distally Intact pulses distally Dorsiflexion/Plantar flexion intact No cellulitis present Compartment soft Dressing - dressing C/D/I Motor Function - intact, moving foot and toes well on exam.    Past Medical History:  Diagnosis Date  . Anemia    h/o   . Arthritis   . Claustrophobia   . Diverticulosis   . Fibrocystic breast disease (FCBD)   . Fibrocystic disease of breast   . GERD (gastroesophageal reflux disease)   . Herpes simplex   . HLD (hyperlipidemia)   . Hypercholesteremia   . Hyperlipidemia   . Hypertension   . Hypothyroidism   . Pre-diabetes   . Pulmonary  nodules   . Pure hypercholesterolemia   . Rotator cuff tear    LEFT SHOULDER  . Sleep apnea    no cpap  . Vitamin D deficiency   . Vitamin D deficiency     Assessment/Plan: 2 Days Post-Op Procedure(s) (LRB): COMPUTER ASSISTED TOTAL KNEE ARTHROPLASTY (Right) Active Problems:   S/P total knee arthroplasty  Estimated body mass index is 41.55 kg/m as calculated from the following:   Height as of 09/09/17: 5\' 6"  (1.676 m).   Weight as of 09/09/17: 116.8 kg (257 lb 6.4 oz). Up with therapy Discharge home with home health  Labs: none DVT Prophylaxis - Lovenox, Foot Pumps and TED hose Weight-Bearing as tolerated to right leg Hemovac was discontinued on today's visit.  The ends of the drains appear to be intact. Patient may be discharged home today after she does therapy if.  She would need to do the lap around the nurses test and steps. Please change dressing prior to discharge and get the patient to extra honeycomb dressings to take home. Apply TED stockings to both legs. Bone foam is to go home with the patient  Jillyn Ledger. [place] Cumings 09/25/2017, 7:28 AM

## 2017-10-02 NOTE — Care Management (Signed)
10/02/17 post discharge note entry:  RNCM received call from Dr. Clydell Hakim office requesting information regarding SNF.  Patient's daughter can not longer stay with patient in the home and patient's husband had injection to his back making if difficult for him to assist patient. Patient told Dr. Clydell Hakim nurse that "she couldn't get up out of the recliner".  I suggested that MD add social worker and NA to home care order as [facility] PT is already in.  I also provided private duty option that patient can pay for if needed.  Patient has primary blue cross and medicare secondary which would require authorization for SNF.

## 2018-01-22 ENCOUNTER — Other Ambulatory Visit: Payer: Self-pay | Admitting: Neurosurgery

## 2018-01-22 DIAGNOSIS — M4310 Spondylolisthesis, site unspecified: Secondary | ICD-10-CM

## 2018-01-25 ENCOUNTER — Other Ambulatory Visit: Payer: Self-pay | Admitting: Internal Medicine

## 2018-01-25 DIAGNOSIS — Z1231 Encounter for screening mammogram for malignant neoplasm of breast: Secondary | ICD-10-CM

## 2018-02-01 ENCOUNTER — Ambulatory Visit
Admission: RE | Admit: 2018-02-01 | Discharge: 2018-02-01 | Disposition: A | Discharge status: Home / Self Care | Payer: BLUE CROSS/BLUE SHIELD | Source: Ambulatory Visit | Attending: Neurosurgery | Admitting: Neurosurgery

## 2018-02-01 DIAGNOSIS — M4310 Spondylolisthesis, site unspecified: Secondary | ICD-10-CM

## 2018-02-01 MED ORDER — METHYLPREDNISOLONE ACETATE 40 MG/ML INJ SUSP (RADIOLOG
120.0000 mg | Freq: Once | INTRAMUSCULAR | Status: AC
  Administered 2018-02-01: 120 mg via EPIDURAL

## 2018-02-01 MED ORDER — IOPAMIDOL (ISOVUE-M 200) INJECTION 41%
1.0000 mL | Freq: Once | INTRAMUSCULAR | Status: AC
  Administered 2018-02-01: 1 mL via EPIDURAL

## 2018-02-01 MED ORDER — DIAZEPAM 5 MG PO TABS
5.0000 mg | ORAL_TABLET | Freq: Once | ORAL | Status: AC
  Administered 2018-02-01: 5 mg via ORAL

## 2018-02-01 NOTE — Discharge Instructions (Signed)

## 2018-02-10 ENCOUNTER — Ambulatory Visit
Admission: RE | Admit: 2018-02-10 | Discharge: 2018-02-10 | Disposition: A | Discharge status: Home / Self Care | Payer: BLUE CROSS/BLUE SHIELD | Source: Ambulatory Visit | Attending: Internal Medicine | Admitting: Internal Medicine

## 2018-02-10 DIAGNOSIS — Z1231 Encounter for screening mammogram for malignant neoplasm of breast: Secondary | ICD-10-CM | POA: Insufficient documentation

## 2018-03-22 ENCOUNTER — Other Ambulatory Visit: Payer: Self-pay | Admitting: Neurosurgery

## 2018-03-22 DIAGNOSIS — M4310 Spondylolisthesis, site unspecified: Secondary | ICD-10-CM

## 2018-03-29 ENCOUNTER — Ambulatory Visit
Admission: RE | Admit: 2018-03-29 | Discharge: 2018-03-29 | Disposition: A | Discharge status: Home / Self Care | Payer: BLUE CROSS/BLUE SHIELD | Source: Ambulatory Visit | Attending: Neurosurgery | Admitting: Neurosurgery

## 2018-03-29 DIAGNOSIS — M4310 Spondylolisthesis, site unspecified: Secondary | ICD-10-CM

## 2018-03-29 MED ORDER — DIAZEPAM 5 MG PO TABS
5.0000 mg | ORAL_TABLET | Freq: Once | ORAL | Status: AC
  Administered 2018-03-29: 5 mg via ORAL

## 2018-03-29 MED ORDER — IOPAMIDOL (ISOVUE-M 200) INJECTION 41%
1.0000 mL | Freq: Once | INTRAMUSCULAR | Status: AC
  Administered 2018-03-29: 1 mL via EPIDURAL

## 2018-03-29 MED ORDER — METHYLPREDNISOLONE ACETATE 40 MG/ML INJ SUSP (RADIOLOG
120.0000 mg | Freq: Once | INTRAMUSCULAR | Status: AC
  Administered 2018-03-29: 120 mg via EPIDURAL

## 2018-04-12 ENCOUNTER — Other Ambulatory Visit: Payer: BLUE CROSS/BLUE SHIELD

## 2018-04-23 ENCOUNTER — Other Ambulatory Visit: Payer: Self-pay | Admitting: Neurosurgery

## 2018-04-23 DIAGNOSIS — M4310 Spondylolisthesis, site unspecified: Secondary | ICD-10-CM

## 2018-05-06 ENCOUNTER — Other Ambulatory Visit: Payer: Medicare Other

## 2018-05-13 ENCOUNTER — Other Ambulatory Visit: Payer: Self-pay | Admitting: Neurosurgery

## 2018-05-13 ENCOUNTER — Ambulatory Visit
Admission: RE | Admit: 2018-05-13 | Discharge: 2018-05-13 | Disposition: A | Discharge status: Home / Self Care | Payer: BLUE CROSS/BLUE SHIELD | Source: Ambulatory Visit | Attending: Neurosurgery | Admitting: Neurosurgery

## 2018-05-13 DIAGNOSIS — M4310 Spondylolisthesis, site unspecified: Secondary | ICD-10-CM

## 2018-05-13 MED ORDER — METHYLPREDNISOLONE ACETATE 40 MG/ML INJ SUSP (RADIOLOG
120.0000 mg | Freq: Once | INTRAMUSCULAR | Status: AC
  Administered 2018-05-13: 120 mg via EPIDURAL

## 2018-05-13 MED ORDER — IOPAMIDOL (ISOVUE-M 200) INJECTION 41%
1.0000 mL | Freq: Once | INTRAMUSCULAR | Status: AC
  Administered 2018-05-13: 1 mL via EPIDURAL

## 2018-07-26 ENCOUNTER — Other Ambulatory Visit: Payer: Self-pay | Admitting: Neurosurgery

## 2018-07-26 DIAGNOSIS — M4310 Spondylolisthesis, site unspecified: Secondary | ICD-10-CM

## 2018-08-06 ENCOUNTER — Ambulatory Visit
Admission: RE | Admit: 2018-08-06 | Discharge: 2018-08-06 | Disposition: A | Discharge status: Home / Self Care | Payer: Medicare Other | Source: Ambulatory Visit | Attending: Neurosurgery | Admitting: Neurosurgery

## 2018-08-06 ENCOUNTER — Other Ambulatory Visit: Payer: Self-pay | Admitting: Neurosurgery

## 2018-08-06 DIAGNOSIS — M4310 Spondylolisthesis, site unspecified: Secondary | ICD-10-CM

## 2018-08-06 MED ORDER — DIAZEPAM 5 MG PO TABS
5.0000 mg | ORAL_TABLET | Freq: Once | ORAL | Status: AC
  Administered 2018-08-06: 5 mg via ORAL

## 2018-08-06 MED ORDER — IOPAMIDOL (ISOVUE-M 200) INJECTION 41%: 1.0000 mL | Freq: Once | INTRAMUSCULAR | Status: DC

## 2018-08-06 MED ORDER — METHYLPREDNISOLONE ACETATE 40 MG/ML INJ SUSP (RADIOLOG: 120.0000 mg | Freq: Once | INTRAMUSCULAR | Status: DC

## 2019-02-21 ENCOUNTER — Other Ambulatory Visit: Payer: Self-pay | Admitting: Internal Medicine

## 2019-02-21 DIAGNOSIS — Z1231 Encounter for screening mammogram for malignant neoplasm of breast: Secondary | ICD-10-CM

## 2019-02-28 IMAGING — MG MM DIGITAL SCREENING BILAT W/ TOMO W/ CAD
8 series · 8 of 24 positions shown · non-contrast
Comparison: Previous exam(s).

CLINICAL DATA: Screening.

EXAM:
DIGITAL SCREENING BILATERAL MAMMOGRAM WITH TOMO AND CAD

[L CC synth-2D]
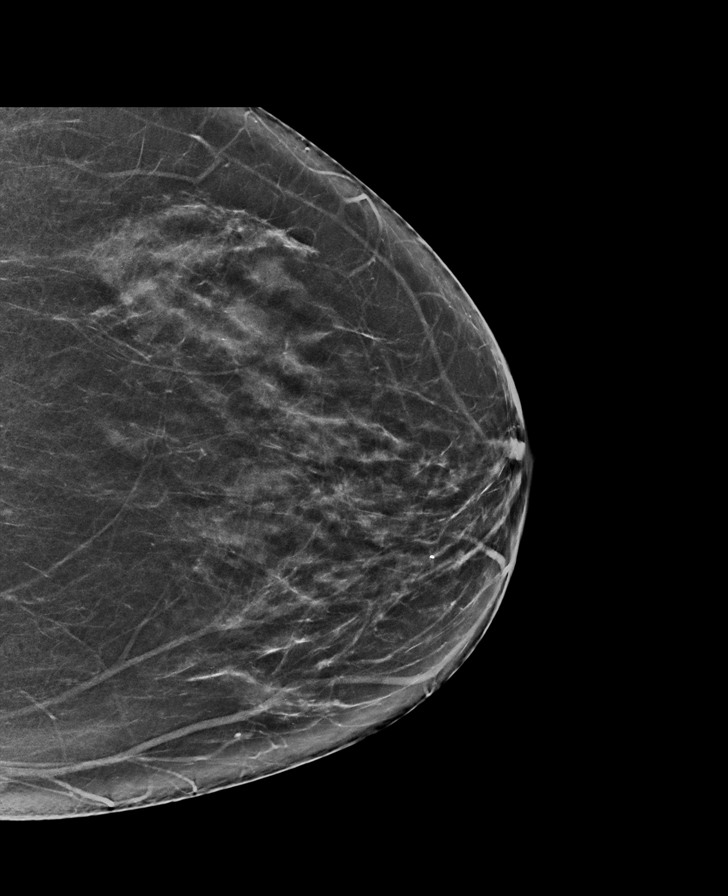

[R CC synth-2D]
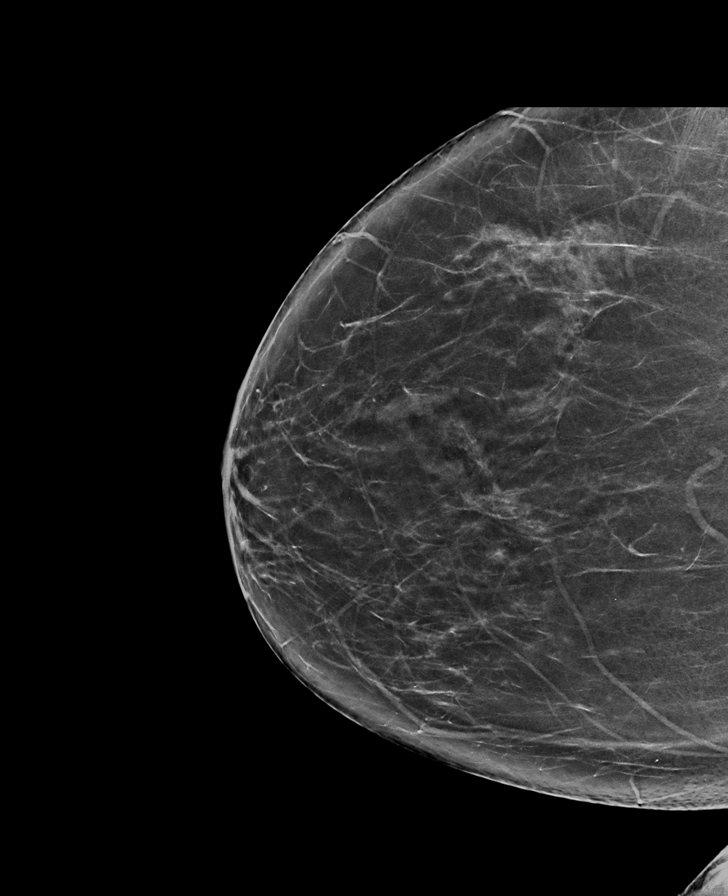

[L MLO synth-2D]
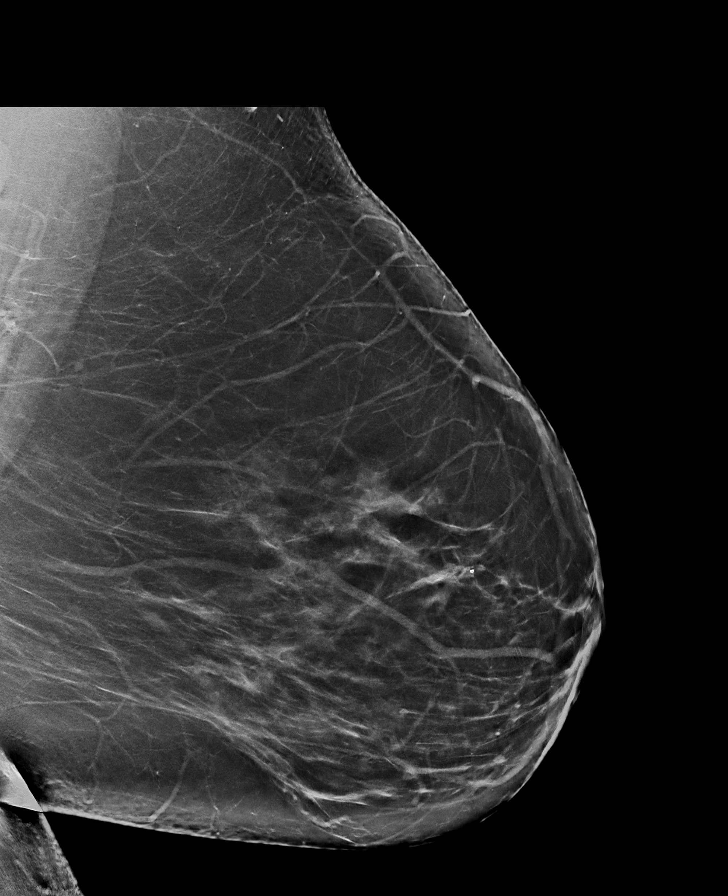

[R MLO synth-2D]
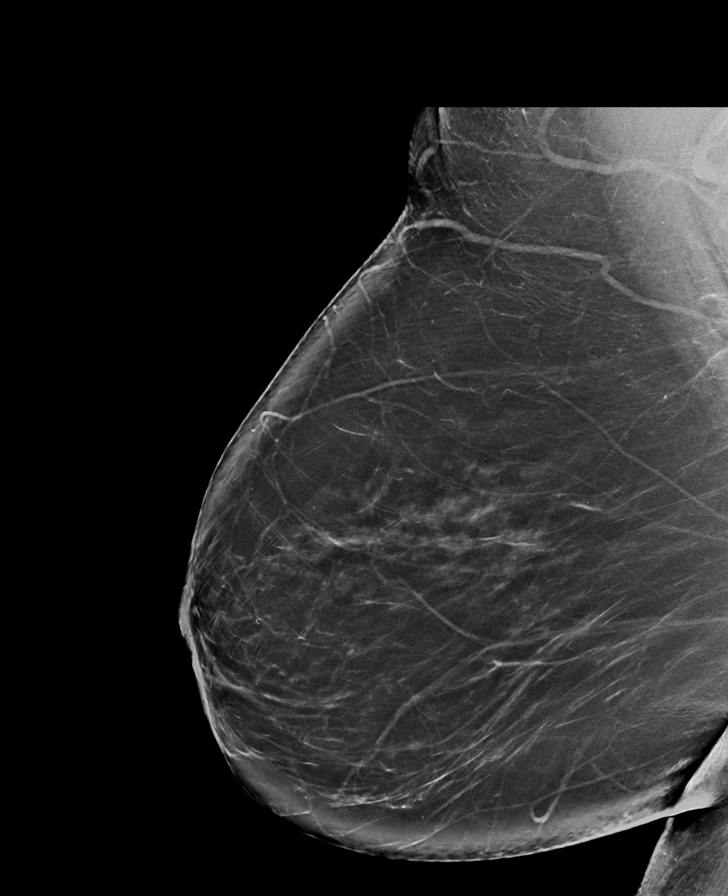

[L MLO tomo · tomo slice 45/88.0]
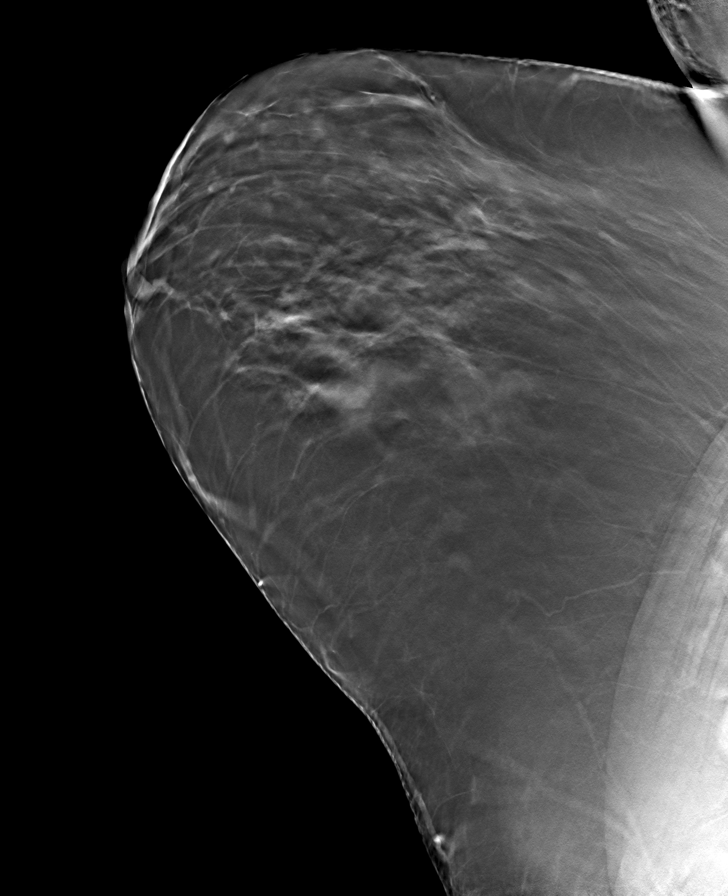

[R CC tomo · tomo slice 37/73.0]
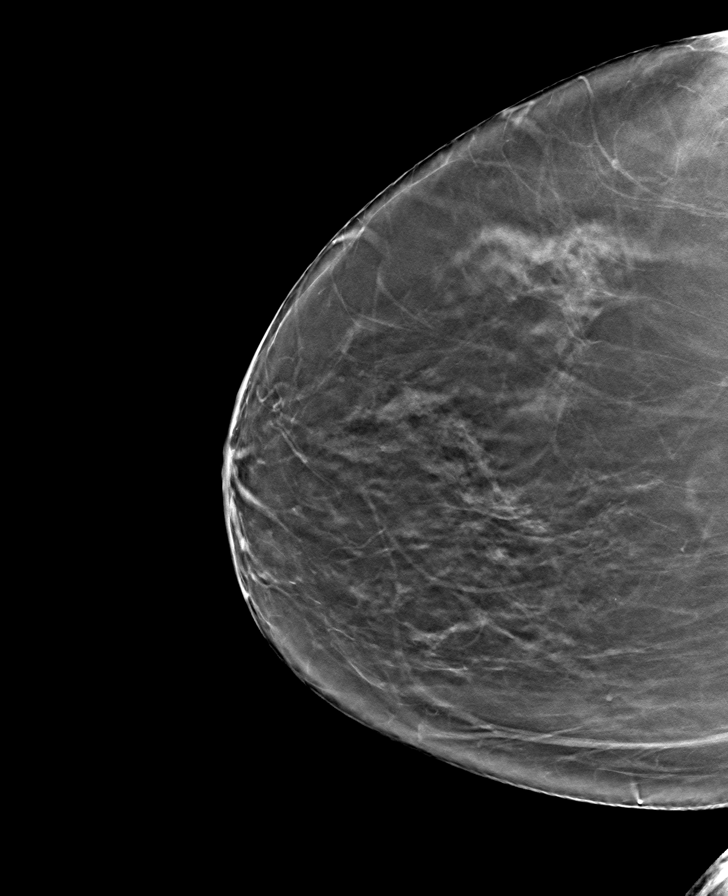

[R MLO tomo · tomo slice 46/91.0]
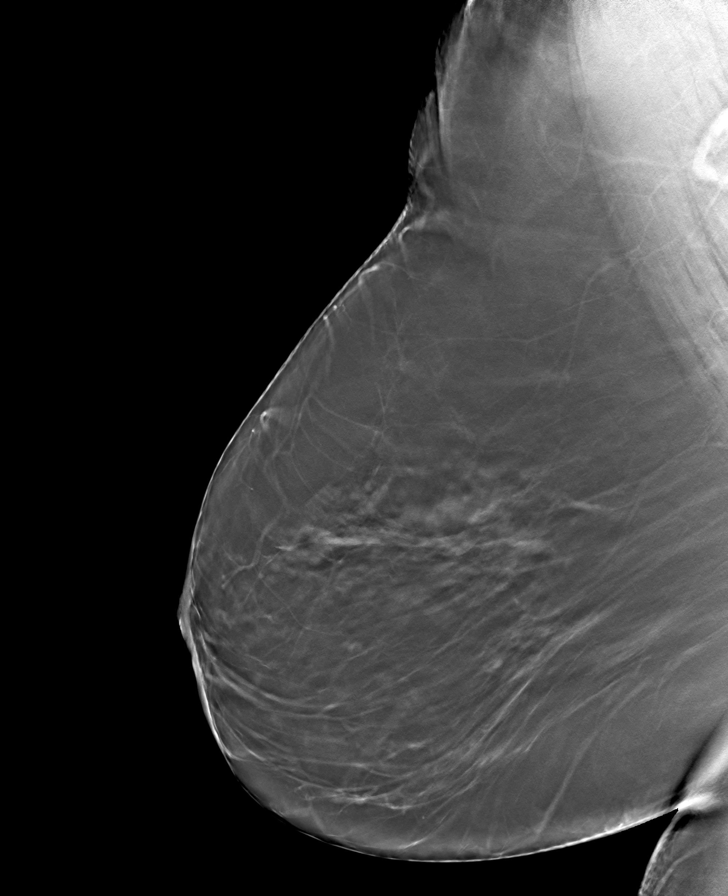

[L CC tomo · tomo slice 37/72.0]
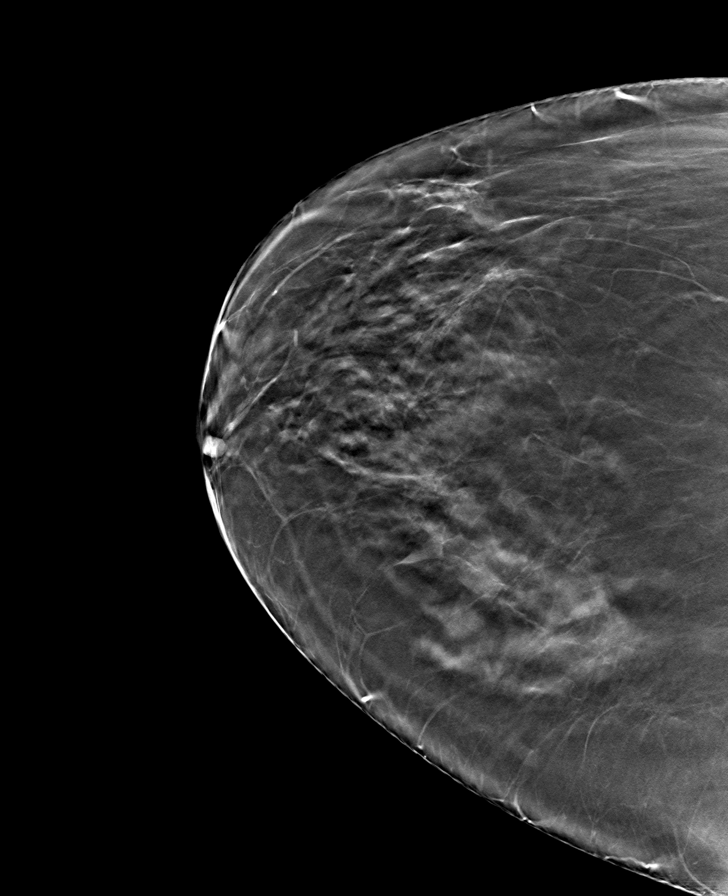

[8 of 24 positions shown; findings below may reference images not displayed]

ACR Breast Density Category b: There are scattered areas of
fibroglandular density.
FINDINGS: There are no findings suspicious for malignancy. Images were
processed with CAD.
IMPRESSION: No mammographic evidence of malignancy. A result letter of this
screening mammogram will be mailed directly to the patient.

RECOMMENDATION:
Screening mammogram in one year. (Code:[id number])

BI-RADS CATEGORY  1: Negative.

## 2019-04-01 ENCOUNTER — Ambulatory Visit
Admission: RE | Admit: 2019-04-01 | Discharge: 2019-04-01 | Disposition: A | Discharge status: Home / Self Care | Payer: No Typology Code available for payment source | Source: Ambulatory Visit | Attending: Internal Medicine | Admitting: Internal Medicine

## 2019-04-01 DIAGNOSIS — Z1231 Encounter for screening mammogram for malignant neoplasm of breast: Secondary | ICD-10-CM | POA: Insufficient documentation

## 2019-05-27 ENCOUNTER — Other Ambulatory Visit: Payer: Self-pay | Admitting: Neurosurgery

## 2019-05-27 DIAGNOSIS — M4310 Spondylolisthesis, site unspecified: Secondary | ICD-10-CM

## 2019-06-06 ENCOUNTER — Other Ambulatory Visit: Payer: Self-pay | Admitting: Neurosurgery

## 2019-06-06 ENCOUNTER — Other Ambulatory Visit: Payer: Self-pay

## 2019-06-06 ENCOUNTER — Ambulatory Visit
Admission: RE | Admit: 2019-06-06 | Discharge: 2019-06-06 | Disposition: A | Discharge status: Home / Self Care | Payer: PRIVATE HEALTH INSURANCE | Source: Ambulatory Visit | Attending: Neurosurgery | Admitting: Neurosurgery

## 2019-06-06 DIAGNOSIS — M4310 Spondylolisthesis, site unspecified: Secondary | ICD-10-CM

## 2019-06-06 MED ORDER — DIAZEPAM 5 MG PO TABS
5.0000 mg | ORAL_TABLET | Freq: Once | ORAL | Status: AC
  Administered 2019-06-06: 5 mg via ORAL

## 2019-06-06 MED ORDER — METHYLPREDNISOLONE ACETATE 40 MG/ML INJ SUSP (RADIOLOG
120.0000 mg | Freq: Once | INTRAMUSCULAR | Status: AC
  Administered 2019-06-06: 120 mg via EPIDURAL

## 2019-06-06 MED ORDER — IOPAMIDOL (ISOVUE-M 200) INJECTION 41%
1.0000 mL | Freq: Once | INTRAMUSCULAR | Status: AC
  Administered 2019-06-06: 1 mL via EPIDURAL

## 2019-11-24 ENCOUNTER — Other Ambulatory Visit: Payer: Self-pay | Admitting: Neurosurgery

## 2019-11-24 DIAGNOSIS — M4310 Spondylolisthesis, site unspecified: Secondary | ICD-10-CM

## 2019-12-08 ENCOUNTER — Ambulatory Visit
Admission: RE | Admit: 2019-12-08 | Discharge: 2019-12-08 | Disposition: A | Discharge status: Home / Self Care | Payer: Medicare Other | Source: Ambulatory Visit | Attending: Neurosurgery | Admitting: Neurosurgery

## 2019-12-08 DIAGNOSIS — M4310 Spondylolisthesis, site unspecified: Secondary | ICD-10-CM

## 2019-12-08 MED ORDER — DIAZEPAM 5 MG PO TABS
5.0000 mg | ORAL_TABLET | Freq: Once | ORAL | Status: AC
  Administered 2019-12-08: 5 mg via ORAL

## 2019-12-08 NOTE — Progress Notes (Signed)
Pt arrived for lumbar epidural injection and requested PO valium prior to procedure due to anxiety. Pt's blood pressure was 225/88, repeated to confirm. Pt was given 5mg  of valium orally. Pt said she wanted to walk outside because she was becoming claustrophobic. Advised that that was okay and someone would be out to get her for her appointment. Pt did not return. Pt was called, and she stated that she left because she was having a panic attack and she was not coming back. Pt was advised to return since she had taken the valium, and pt refused. Dr. Jeralyn Ruths made aware of the situation.

## 2020-03-12 ENCOUNTER — Ambulatory Visit: Payer: Medicare Other | Attending: Internal Medicine

## 2020-03-12 DIAGNOSIS — Z23 Encounter for immunization: Secondary | ICD-10-CM

## 2020-03-12 NOTE — Progress Notes (Signed)
   Covid-19 Vaccination Clinic  Name:  Dana Morton    MRN: [id number] DOB: 1950-11-04  03/12/2020  Dana Morton was observed post Covid-19 immunization for 15 minutes without incident. She was provided with Vaccine Information Sheet and instruction to access the V-Safe system.   Dana Morton was instructed to call 911 with any severe reactions post vaccine: Marland Kitchen Difficulty breathing  . Swelling of face and throat  . A fast heartbeat  . A bad rash all over body  . Dizziness and weakness

## 2020-04-26 ENCOUNTER — Other Ambulatory Visit: Payer: Self-pay | Admitting: Internal Medicine

## 2020-04-26 DIAGNOSIS — Z1231 Encounter for screening mammogram for malignant neoplasm of breast: Secondary | ICD-10-CM

## 2020-05-30 ENCOUNTER — Other Ambulatory Visit: Payer: Self-pay

## 2020-05-30 ENCOUNTER — Ambulatory Visit
Admission: RE | Admit: 2020-05-30 | Discharge: 2020-05-30 | Disposition: A | Discharge status: Home / Self Care | Payer: Medicare Other | Source: Ambulatory Visit | Attending: Internal Medicine | Admitting: Internal Medicine

## 2020-05-30 DIAGNOSIS — Z1231 Encounter for screening mammogram for malignant neoplasm of breast: Secondary | ICD-10-CM | POA: Diagnosis not present

## 2020-06-06 ENCOUNTER — Other Ambulatory Visit: Payer: Self-pay | Admitting: Internal Medicine

## 2020-06-06 DIAGNOSIS — R921 Mammographic calcification found on diagnostic imaging of breast: Secondary | ICD-10-CM

## 2020-06-06 DIAGNOSIS — R928 Other abnormal and inconclusive findings on diagnostic imaging of breast: Secondary | ICD-10-CM

## 2020-06-06 DIAGNOSIS — N6310 Unspecified lump in the right breast, unspecified quadrant: Secondary | ICD-10-CM

## 2020-06-13 ENCOUNTER — Ambulatory Visit
Admission: RE | Admit: 2020-06-13 | Discharge: 2020-06-13 | Disposition: A | Discharge status: Home / Self Care | Payer: Medicare Other | Source: Ambulatory Visit | Attending: Internal Medicine | Admitting: Internal Medicine

## 2020-06-13 ENCOUNTER — Other Ambulatory Visit: Payer: Self-pay

## 2020-06-13 DIAGNOSIS — N6310 Unspecified lump in the right breast, unspecified quadrant: Secondary | ICD-10-CM

## 2020-06-13 DIAGNOSIS — R921 Mammographic calcification found on diagnostic imaging of breast: Secondary | ICD-10-CM | POA: Diagnosis present

## 2020-06-13 DIAGNOSIS — R928 Other abnormal and inconclusive findings on diagnostic imaging of breast: Secondary | ICD-10-CM

## 2020-06-18 ENCOUNTER — Other Ambulatory Visit: Payer: Self-pay | Admitting: Internal Medicine

## 2020-06-18 DIAGNOSIS — R921 Mammographic calcification found on diagnostic imaging of breast: Secondary | ICD-10-CM

## 2020-06-18 DIAGNOSIS — R928 Other abnormal and inconclusive findings on diagnostic imaging of breast: Secondary | ICD-10-CM

## 2020-06-19 ENCOUNTER — Ambulatory Visit
Admission: RE | Admit: 2020-06-19 | Discharge: 2020-06-19 | Disposition: A | Discharge status: Home / Self Care | Payer: Medicare Other | Source: Ambulatory Visit | Attending: Internal Medicine | Admitting: Internal Medicine

## 2020-06-19 ENCOUNTER — Other Ambulatory Visit: Payer: Self-pay

## 2020-06-19 DIAGNOSIS — R928 Other abnormal and inconclusive findings on diagnostic imaging of breast: Secondary | ICD-10-CM | POA: Diagnosis not present

## 2020-06-19 DIAGNOSIS — R921 Mammographic calcification found on diagnostic imaging of breast: Secondary | ICD-10-CM | POA: Diagnosis present

## 2020-06-19 HISTORY — PX: BREAST BIOPSY: SHX20

## 2020-06-20 LAB — SURGICAL PATHOLOGY

## 2020-11-13 ENCOUNTER — Ambulatory Visit: Payer: Medicare Other | Attending: Neurosurgery

## 2020-11-13 ENCOUNTER — Other Ambulatory Visit: Payer: Self-pay

## 2020-11-13 DIAGNOSIS — M542 Cervicalgia: Secondary | ICD-10-CM | POA: Insufficient documentation

## 2020-11-13 DIAGNOSIS — M5412 Radiculopathy, cervical region: Secondary | ICD-10-CM | POA: Diagnosis present

## 2020-11-13 NOTE — Therapy (Signed)
[place] [facility] [address], [place], [address] Phone: [phone]   Fax:  [phone]  Physical Therapy Evaluation  Patient Details  Name: Dana Morton MRN: [id number] Date of Birth: 1951/07/05 Referring Provider (PT): Earnie Larsson, MD   Encounter Date: 11/13/2020   PT End of Session - 11/13/20 1420    Visit Number 1    Number of Visits 17    Date for PT Re-Evaluation 01/10/21    Authorization Type 1    Authorization Time Period 10    PT Start Time 1420    PT Stop Time 1500    PT Time Calculation (min) 40 min    Activity Tolerance Patient tolerated treatment well    Behavior During Therapy [facility] for tasks assessed/performed           Past Medical History:  Diagnosis Date  . Anemia    h/o   . Arthritis   . Claustrophobia   . Diverticulosis   . Fibrocystic breast disease (FCBD)   . Fibrocystic disease of breast   . GERD (gastroesophageal reflux disease)   . Herpes simplex   . HLD (hyperlipidemia)   . Hypercholesteremia   . Hyperlipidemia   . Hypertension   . Hypothyroidism   . Pre-diabetes   . Pulmonary nodules   . Pure hypercholesterolemia   . Rotator cuff tear    LEFT SHOULDER  . Sleep apnea    no cpap  . Vitamin D deficiency   . Vitamin D deficiency     Past Surgical History:  Procedure Laterality Date  . ABDOMINAL HYSTERECTOMY    . BREAST BIOPSY Left 06/19/2020   stereo biopsy/ x clip/ path pending  . BREAST EXCISIONAL BIOPSY Left 70/80s   benign  . CHOLECYSTECTOMY    . COLONOSCOPY WITH PROPOFOL N/A 03/22/2015   Procedure: COLONOSCOPY WITH PROPOFOL;  Surgeon: Hulen Luster, MD;  Location: [facility] ENDOSCOPY;  Service: Gastroenterology;  Laterality: N/A;  . DG FOOT HEEL ([place] HX)    . DILATION AND CURETTAGE OF UTERUS    . ESOPHAGOGASTRODUODENOSCOPY (EGD) WITH PROPOFOL N/A 03/22/2015   Procedure: ESOPHAGOGASTRODUODENOSCOPY (EGD) WITH PROPOFOL;  Surgeon: Hulen Luster, MD;  Location: [facility]  ENDOSCOPY;  Service: Gastroenterology;  Laterality: N/A;  . KNEE ARTHROPLASTY Right 09/23/2017   Procedure: COMPUTER ASSISTED TOTAL KNEE ARTHROPLASTY;  Surgeon: Dereck Leep, MD;  Location: [facility] ORS;  Service: Orthopedics;  Laterality: Right;  . KNEE ARTHROSCOPY Right 08/02/2015   Procedure: ARTHROSCOPY KNEE, PARTIAL MEDIAL MENISECTOMY, PLICA INCISION;  Surgeon: Hessie Knows, MD;  Location: [facility] ORS;  Service: Orthopedics;  Laterality: Right;  . KNEE ARTHROSCOPY      There were no vitals filed for this visit.    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 Pain in neck: L upper trap area. 5/10 L neck pain currently, 9/10 neck pain at worst for the past 3 months.    Pertinent History Neck pain. Pain began several weeks ago sudden onset, no known method of injury. No imaging for her neck yet. 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 Currently doing chores such as taking trash out, take the groceries in, lifiting the hamper. Pt is R hand dominant. Used to have L hand pareshtesia. Currently does not have the hand symptoms. Pt also states having L low back pain.    Patient Stated Goals Get rid of the burning in her neck.  Currently in Pain? Yes    Pain Score 5     Pain Location Neck    Pain Orientation Left    Pain Descriptors / Indicators Burning;Stabbing    Pain Type Chronic pain    Pain Radiating Towards L upper trap    Pain Onset More than a month ago    Pain Frequency Constant    Aggravating Factors  Cervical rotation R and L. Pt also feels L neck pain when her back pain bothers her.    Pain Relieving Factors Staying still. Rest. Heating pad.              [facility] PT Assessment - 11/13/20 1432      Assessment   Medical Diagnosis Cervical Radiculopathy    Referring Provider (PT) Earnie Larsson, MD    Onset Date/Surgical Date 11/01/20    Prior Therapy Yes for knee      Precautions   Precaution Comments No known precautions       Restrictions   Other Position/Activity Restrictions No known restrictions      Balance Screen   Has the patient fallen in the past 6 months No    Has the patient had a decrease in activity level because of a fear of falling?  No    Is the patient reluctant to leave their home because of a fear of falling?  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 R cervical lateral shift, L shoulder lower, R lateral shift      AROM   Overall AROM Comments Movement preferenc to C5/C6 area    Cervical Flexion WFl    Cervical Extension WFL    Cervical - Right Side Bend WFL    Cervical - Left Side Bend WFL    Cervical - Right Rotation WFL with pain    Cervical - Left Rotation WFL pain      Strength   Right Shoulder Flexion 4+/5    Right Shoulder ABduction 4+/5    Right Shoulder Internal Rotation 4+/5    Right Shoulder External Rotation 4/5    Left Shoulder Flexion 5/5    Left Shoulder ABduction 5/5    Left Shoulder Internal Rotation 4+/5    Left Shoulder External Rotation 4+/5    Right Elbow Flexion 4+/5    Right Elbow Extension 4+/5    Left Elbow Flexion 4/5    Left Elbow Extension 4+/5      Palpation   Palpation comment R upper trap muscle tension                      Objective measurements completed on examination: See above findings.  No latex allergies Blood pressure is controlled per pt.  No falls within the past 6 months. Fear of falling does not limit her but she is more carefull     Therapeutic exercise  Seated manually resisted L cervical lateral shift isometrics in neutral 10x3 with 5 seconds  Decreased L UT pain with L cervical rotation  Seated B scapular retraction 7x5 seconds. L upper trap symptoms.   Cervical rotation with scapular retraction.   Decreased neck pain  with cervical rotation.    Improved exercise technique, movement at target joints, use of target muscles  after mod verbal, visual, tactile cues.       Manual therapy  Seated STM cervical paraspinal muscles and R upper trap area to decreaset tension      Response to treatment Decreased L upper trap pain with cervical rotation after treatment.   Clinical impression Pt is a 70 year old female who came to physical therapy secondary to neck/L upper trap pain. She also presents with altered posture, R upper trap and B cervical paraspinal muscle tension, reproduction of symptoms with R and L cervical rotation, scapular weakness, and difficulty performing task which involve turning her head. Pt will benefit from skilled physical therapy services to address the aforementioned deficits.              PT Education - 11/13/20 2015    Education provided Yes    Education Details ther-ex, plan of care    Person(s) Educated Patient    Methods Explanation;Demonstration;Tactile cues;Verbal cues    Comprehension Returned demonstration;Verbalized understanding            PT Short Term Goals - 11/13/20 2029      PT SHORT TERM GOAL #1   Title Patient will be independent with her initial HEP to improve ability to turn her head without pain.    Time 3    Period Weeks    Status New    Target Date 11/15/20             PT Long Term Goals - 11/13/20 2031      PT LONG TERM GOAL #1   Title Pt will have a decrease in neck pain to 2/10 or less at worst to promote ability to turn her head more comfortably.    Baseline 9/10 neck pain at most for the past 3 months (11/14/2018)    Time 8    Period Weeks    Status New    Target Date 01/10/21      PT LONG TERM GOAL #2   Title Pt will improve her neck FOTO by at least 10 points as a demonstration of improved function.    Baseline Neck FOTO 57 (11/13/2020)    Time 8    Period Weeks    Status New    Target Date 01/10/21                   Plan - 11/13/20 2016    Clinical Impression Statement Pt is a 70 year old female who came to physical therapy secondary to neck/L upper trap pain. She also presents with altered posture, R upper trap and B cervical paraspinal muscle tension, reproduction of symptoms with R and L cervical rotation, scapular weakness, and difficulty performing task which involve turning her head. Pt will benefit from skilled physical therapy services to address the aforementioned deficits.    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   Comorbidities Arthritis, HTN    Examination-Activity Limitations Other   looking around   Stability/Clinical Decision Making Stable/Uncomplicated    Clinical Decision Making Low    Rehab Potential Fair    PT Frequency 2x / week    PT Duration 8 weeks    PT Treatment/Interventions Therapeutic activities;Therapeutic exercise;Neuromuscular re-education;Manual techniques;Dry needling;Electrical Stimulation;Iontophoresis 4mg /ml Dexamethasone;Traction    PT Next Visit Plan scapular strengthening, posture, manual techniques, modalities PRN    Consulted and Agree with Plan of Care Patient           Patient will  benefit from skilled therapeutic intervention in order to improve the following deficits and impairments:  Pain,Postural dysfunction,Improper body mechanics  Visit Diagnosis: Cervicalgia - Plan: PT plan of care cert/re-cert  Radiculopathy, cervical region - Plan: PT plan of care cert/re-cert     Problem List Patient Active Problem List   Diagnosis Date Noted  . S/P total knee arthroplasty 09/23/2017  . Diabetes mellitus type 2, uncomplicated ([place]) 29/93/7169  . Hypertension 09/22/2017  . Hypothyroidism, unspecified 09/22/2017  . Vitamin D deficiency, unspecified 09/22/2017  . Edema of both legs 03/03/2017  . BMI 40.0-44.9, adult ([place]) 12/03/2015  . Pure  hypercholesterolemia 05/26/2014    Joneen Boers PT, DPT   11/13/2020, 8:47 PM  [place] [facility] [address] [place], [address] Phone: [phone]   Fax:  [phone]  Name: Dana Morton MRN: [id number] Date of Birth: 1950-11-17

## 2020-11-15 ENCOUNTER — Ambulatory Visit: Payer: Medicare Other

## 2020-11-15 ENCOUNTER — Other Ambulatory Visit: Payer: Self-pay

## 2020-11-15 DIAGNOSIS — M542 Cervicalgia: Secondary | ICD-10-CM

## 2020-11-15 DIAGNOSIS — M5412 Radiculopathy, cervical region: Secondary | ICD-10-CM

## 2020-11-15 NOTE — Therapy (Signed)
[place] [facility] [address] [place], [address] Phone: [phone]   Fax:  [phone]  Physical Therapy Treatment  Patient Details  Name: Dana Morton MRN: [id number] Date of Birth: August 12, 1950 Referring Provider (PT): Earnie Larsson, MD   Encounter Date: 11/15/2020   PT End of Session - 11/15/20 1717    Visit Number 2    Number of Visits 17    Date for PT Re-Evaluation 01/10/21    Authorization Type 2    Authorization Time Period 10    PT Start Time 1717    PT Stop Time 1809    PT Time Calculation (min) 52 min    Activity Tolerance Patient tolerated treatment well    Behavior During Therapy [facility] for tasks assessed/performed           Past Medical History:  Diagnosis Date  . Anemia    h/o   . Arthritis   . Claustrophobia   . Diverticulosis   . Fibrocystic breast disease (FCBD)   . Fibrocystic disease of breast   . GERD (gastroesophageal reflux disease)   . Herpes simplex   . HLD (hyperlipidemia)   . Hypercholesteremia   . Hyperlipidemia   . Hypertension   . Hypothyroidism   . Pre-diabetes   . Pulmonary nodules   . Pure hypercholesterolemia   . Rotator cuff tear    LEFT SHOULDER  . Sleep apnea    no cpap  . Vitamin D deficiency   . Vitamin D deficiency     Past Surgical History:  Procedure Laterality Date  . ABDOMINAL HYSTERECTOMY    . BREAST BIOPSY Left 06/19/2020   stereo biopsy/ x clip/ path pending  . BREAST EXCISIONAL BIOPSY Left 70/80s   benign  . CHOLECYSTECTOMY    . COLONOSCOPY WITH PROPOFOL N/A 03/22/2015   Procedure: COLONOSCOPY WITH PROPOFOL;  Surgeon: Hulen Luster, MD;  Location: [facility] ENDOSCOPY;  Service: Gastroenterology;  Laterality: N/A;  . DG FOOT HEEL ([place] HX)    . DILATION AND CURETTAGE OF UTERUS    . ESOPHAGOGASTRODUODENOSCOPY (EGD) WITH PROPOFOL N/A 03/22/2015   Procedure: ESOPHAGOGASTRODUODENOSCOPY (EGD) WITH PROPOFOL;  Surgeon: Hulen Luster, MD;  Location: [facility]  ENDOSCOPY;  Service: Gastroenterology;  Laterality: N/A;  . KNEE ARTHROPLASTY Right 09/23/2017   Procedure: COMPUTER ASSISTED TOTAL KNEE ARTHROPLASTY;  Surgeon: Dereck Leep, MD;  Location: [facility] ORS;  Service: Orthopedics;  Laterality: Right;  . KNEE ARTHROSCOPY Right 08/02/2015   Procedure: ARTHROSCOPY KNEE, PARTIAL MEDIAL MENISECTOMY, PLICA INCISION;  Surgeon: Hessie Knows, MD;  Location: [facility] ORS;  Service: Orthopedics;  Laterality: Right;  . KNEE ARTHROSCOPY      There were no vitals filed for this visit.   Subjective Assessment - 11/15/20 1719    Subjective L neck is doing pretty good. Only has pain in her low back. Neck bothers her when she turns her head too far. No pain at rest.    Pertinent History Neck pain. Pain began several weeks ago sudden onset, no known method of injury. No imaging for her neck yet. 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 Currently doing chores such as taking trash out, take the groceries in, lifiting the hamper. Pt is R hand dominant. Used to have L hand pareshtesia. Currently does not have the hand symptoms. Pt also states having L low back pain.    Patient Stated Goals Get rid of the burning in her neck.    Currently in Pain? No/denies  Pain Onset More than a month ago                                     PT Education - 11/15/20 1738    Education provided Yes    Education Details ther-ex    [employer]) Educated Patient    Methods Explanation;Demonstration;Tactile cues;Verbal cues    Comprehension Returned demonstration;Verbalized understanding          Objective measurements completed on examination: See above findings.  No latex allergies Blood pressure is controlled per pt.  No falls within the past 6 months. Fear of falling does not limit her but she is more carefull   [employer] Access Code [email or web address]   Manual therapy  Seated STM cervical paraspinal muscles and R upper trap area to decreaset tension and fascial  restrictions    Therapeutic exercise  Seated manually resisted L cervical lateral shift isometrics in neutral 10x3 with 5 seconds          Seated B scapular retraction 10x3  Then manually resisted targeting lower trap muscles    R 10x5 seconds for 3 sets   L 10x5 seconds for 3 sets     Then L again secondary to decreased pain with R and L cervical rotation to end range 10x5 seconds   Seated L cervical side bend with PT gentle caudal pressure to R first rib area 5 seconds x 2  R UE symptoms at first. Low back symptoms with 2nd repetition. Eases with rest.   Seated chin tucks 10x3  Seated cervical rotation with pressing tongue at roof of mouth   R 5x   Improved exercise technique, movement at target joints, use of target muscles after mod verbal, visual, tactile cues.   Response to treatment Fair tolerance to today's session.   Clinical impression Continued working on decreasing cervical paraspinal and B upper trap muscle tension. Decreased cervical pain with R rotation with L scapular retraction. R upper trap muscle tension > L. Decreased neck pain with cervical rotation as well after performing cervical retraction exercises as well to improve intervertebral foraminal space. Fair tolerance to today's session. Pt will benefit from continued skilled physical therapy services to decrease pain, improve strength and function.         PT Short Term Goals - 11/13/20 2029      PT SHORT TERM GOAL #1   Title Patient will be independent with her initial HEP to improve ability to turn her head without pain.    Time 3    Period Weeks    Status New    Target Date 11/15/20             PT Long Term Goals - 11/13/20 2031      PT LONG TERM GOAL #1   Title Pt will have a decrease in neck pain to 2/10 or less at worst to promote ability to turn her head more comfortably.    Baseline 9/10 neck pain at most for the past 3 months (11/14/2018)    Time 8    Period Weeks    Status  New    Target Date 01/10/21      PT LONG TERM GOAL #2   Title Pt will improve her neck FOTO by at least 10 points as a demonstration of improved function.    Baseline Neck FOTO 57 (11/13/2020)    Time 8  Period Weeks    Status New    Target Date 01/10/21                 Plan - 11/15/20 1742    Clinical Impression Statement Continued working on decreasing cervical paraspinal and B upper trap muscle tension. Decreased cervical pain with R rotation with L scapular retraction. R upper trap muscle tension > L. Decreased neck pain with cervical rotation as well after performing cervical retraction exercises as well to improve intervertebral foraminal space. Fair tolerance to today's session. Pt will benefit from continued skilled physical therapy services to decrease pain, improve strength and function.    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   Comorbidities Arthritis, HTN    Examination-Activity Limitations Other   looking around   Stability/Clinical Decision Making Stable/Uncomplicated    Rehab Potential Fair    PT Frequency 2x / week    PT Duration 8 weeks    PT Treatment/Interventions Therapeutic activities;Therapeutic exercise;Neuromuscular re-education;Manual techniques;Dry needling;Electrical Stimulation;Iontophoresis 4mg /ml Dexamethasone;Traction    PT Next Visit Plan scapular strengthening, posture, manual techniques, modalities PRN    Consulted and Agree with Plan of Care Patient           Patient will benefit from skilled therapeutic intervention in order to improve the following deficits and impairments:  Pain,Postural dysfunction,Improper body mechanics  Visit Diagnosis: Cervicalgia  Radiculopathy, cervical region     Problem List Patient Active Problem List   Diagnosis Date Noted  . S/P total knee arthroplasty 09/23/2017  . Diabetes mellitus type 2, uncomplicated  ([place]) 97/35/3299  . Hypertension 09/22/2017  . Hypothyroidism, unspecified 09/22/2017  . Vitamin D deficiency, unspecified 09/22/2017  . Edema of both legs 03/03/2017  . BMI 40.0-44.9, adult ([place]) 12/03/2015  . Pure hypercholesterolemia 05/26/2014    Joneen Boers PT, DPT   11/15/2020, 6:35 PM  [place] [facility] [address], [place], [address] Phone: [phone]   Fax:  [phone]  Name: Dana Morton MRN: [id number] Date of Birth: 30-Aug-1950

## 2020-11-15 NOTE — Patient Instructions (Signed)
Access Code: [email or web address] URL: [email or web address] Date: 11/15/2020 Prepared by: Joneen Boers  Exercises Seated Scapular Retraction - 1 x daily - 7 x weekly - 3 sets - 10 reps - 5 seconds hold

## 2020-11-20 ENCOUNTER — Other Ambulatory Visit: Payer: Self-pay

## 2020-11-20 ENCOUNTER — Ambulatory Visit: Payer: Medicare Other

## 2020-11-20 DIAGNOSIS — M542 Cervicalgia: Secondary | ICD-10-CM

## 2020-11-20 DIAGNOSIS — M5412 Radiculopathy, cervical region: Secondary | ICD-10-CM

## 2020-11-20 NOTE — Therapy (Signed)
[place] [address] [place], [address] Phone: [phone]   Fax:  [phone]  Physical Therapy Treatment  Patient Details  Name: Dana Morton MRN: [id number] Date of Birth: [birth year] Referring Provider (PT): Earnie Larsson, MD   Encounter Date: 11/20/2020   PT End of Session - 11/20/20 1635    Visit Number 3    Number of Visits 17    Date for PT Re-Evaluation 01/10/21    Authorization Type 3    Authorization Time Period 10    PT Start Time 1635    PT Stop Time 6789    PT Time Calculation (min) 40 min    Activity Tolerance Patient tolerated treatment well    Behavior During Therapy [facility] for tasks assessed/performed           Past Medical History:  Diagnosis Date  . Anemia    h/o   . Arthritis   . Claustrophobia   . Diverticulosis   . Fibrocystic breast disease (FCBD)   . Fibrocystic disease of breast   . GERD (gastroesophageal reflux disease)   . Herpes simplex   . HLD (hyperlipidemia)   . Hypercholesteremia   . Hyperlipidemia   . Hypertension   . Hypothyroidism   . Pre-diabetes   . Pulmonary nodules   . Pure hypercholesterolemia   . Rotator cuff tear    LEFT SHOULDER  . Sleep apnea    no cpap  . Vitamin D deficiency   . Vitamin D deficiency     Past Surgical History:  Procedure Laterality Date  . ABDOMINAL HYSTERECTOMY    . BREAST BIOPSY Left 06/19/2020   stereo biopsy/ x clip/ path pending  . BREAST EXCISIONAL BIOPSY Left 70/80s   benign  . CHOLECYSTECTOMY    . COLONOSCOPY WITH PROPOFOL N/A 03/22/2015   Procedure: COLONOSCOPY WITH PROPOFOL;  Surgeon: Hulen Luster, MD;  Location: [facility] ENDOSCOPY;  Service: Gastroenterology;  Laterality: N/A;  . DG FOOT HEEL ([place] HX)    . DILATION AND CURETTAGE OF UTERUS    . ESOPHAGOGASTRODUODENOSCOPY (EGD) WITH PROPOFOL N/A 03/22/2015   Procedure: ESOPHAGOGASTRODUODENOSCOPY (EGD) WITH PROPOFOL;  Surgeon: Hulen Luster, MD;  Location: [facility]  ENDOSCOPY;  Service: Gastroenterology;  Laterality: N/A;  . KNEE ARTHROPLASTY Right 09/23/2017   Procedure: COMPUTER ASSISTED TOTAL KNEE ARTHROPLASTY;  Surgeon: Dereck Leep, MD;  Location: [facility] ORS;  Service: Orthopedics;  Laterality: Right;  . KNEE ARTHROSCOPY Right 08/02/2015   Procedure: ARTHROSCOPY KNEE, PARTIAL MEDIAL MENISECTOMY, PLICA INCISION;  Surgeon: Hessie Knows, MD;  Location: [facility] ORS;  Service: Orthopedics;  Laterality: Right;  . KNEE ARTHROSCOPY      There were no vitals filed for this visit.   Subjective Assessment - 11/20/20 1637    Subjective Neck has been doing pretty good. Pain seems to be in her low back. No neck pain currently.    Pertinent History Neck pain. Pain began several weeks ago sudden onset, no known method of injury. No imaging for her neck yet. 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 Currently doing chores such as taking trash out, take the groceries in, lifiting the hamper. Pt is R hand dominant. Used to have L hand pareshtesia. Currently does not have the hand symptoms. Pt also states having L low back pain.    Patient Stated Goals Get rid of the burning in her neck.    Currently in Pain? No/denies    Pain Score 0-No pain  Pain Onset More than a month ago                                     PT Education - 11/20/20 1654    Education provided Yes    Education Details ther-ex    [employer]) Educated Patient    Methods Explanation;Demonstration;Tactile cues;Verbal cues    Comprehension Verbalized understanding;Returned demonstration          Objective measurements completed on examination: See above findings. No latex allergies Blood pressure is controlled per pt.  No falls within the past 6 months. Fear of falling does not limit her but she is more carefull   [employer] Access Code [email or web address]   Manual therapy  Seated STM cervical paraspinal muscles and R upper trap area to decreaset tensionand fascial  restrictions    Therapeutic exercise   Cervical rotation  L WFL  R WFL with discomfort.   Seated B scapular retraction 10x5 seconds for 2 sets              Then manually resisted targeting lower trap muscles                          R 10x5 seconds for 3 sets                         L 10x5 seconds for 3 sets   Seated chin tucks 2x5 seconds. Felt knee discomfort to her knees, eased with rest.   Seated manually resisted L cervical lateral shift isometrics in neutral 10x3 with 5 seconds  Cervical rotation R and L after session. No pain.      Improved exercise technique, movement at target joints, use of target muscles after mod verbal, visual, tactile cues.  Response to treatment No pain with cervical rotation after session.    Clinical impression Continued working on lower trap strengthening to decrease B upper trap muscle tension to neck. No neck pain at end of session with cervical rotation. Pt will benefit from continued skilled physical therapy services to decrease pain, improve strength and function.       PT Short Term Goals - 11/13/20 2029      PT SHORT TERM GOAL #1   Title Patient will be independent with her initial HEP to improve ability to turn her head without pain.    Time 3    Period Weeks    Status New    Target Date 11/15/20             PT Long Term Goals - 11/13/20 2031      PT LONG TERM GOAL #1   Title Pt will have a decrease in neck pain to 2/10 or less at worst to promote ability to turn her head more comfortably.    Baseline 9/10 neck pain at most for the past 3 months (11/14/2018)    Time 8    Period Weeks    Status New    Target Date 01/10/21      PT LONG TERM GOAL #2   Title Pt will improve her neck FOTO by at least 10 points as a demonstration of improved function.    Baseline Neck FOTO 57 (11/13/2020)    Time 8    Period Weeks    Status New    Target Date 01/10/21  Plan - 11/20/20 1654     Clinical Impression Statement Continued working on lower trap strengthening to decrease B upper trap muscle tension to neck. No neck pain at end of session with cervical rotation. Pt will benefit from continued skilled physical therapy services to decrease pain, improve strength and function.    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   Comorbidities Arthritis, HTN    Examination-Activity Limitations Other   looking around   Stability/Clinical Decision Making Stable/Uncomplicated    Rehab Potential Fair    PT Frequency 2x / week    PT Duration 8 weeks    PT Treatment/Interventions Therapeutic activities;Therapeutic exercise;Neuromuscular re-education;Manual techniques;Dry needling;Electrical Stimulation;Iontophoresis 4mg /ml Dexamethasone;Traction    PT Next Visit Plan scapular strengthening, posture, manual techniques, modalities PRN    Consulted and Agree with Plan of Care Patient           Patient will benefit from skilled therapeutic intervention in order to improve the following deficits and impairments:  Pain,Postural dysfunction,Improper body mechanics  Visit Diagnosis: Cervicalgia  Radiculopathy, cervical region     Problem List Patient Active Problem List   Diagnosis Date Noted  . S/P total knee arthroplasty 09/23/2017  . Diabetes mellitus type 2, uncomplicated ([place]) 38/45/3646  . Hypertension 09/22/2017  . Hypothyroidism, unspecified 09/22/2017  . Vitamin D deficiency, unspecified 09/22/2017  . Edema of both legs 03/03/2017  . BMI 40.0-44.9, adult ([place]) 12/03/2015  . Pure hypercholesterolemia 05/26/2014    Joneen Boers PT, DPT   11/20/2020, 5:17 PM  [place] [facility] [address] [place], [address] Phone: [phone]   Fax:  [phone]  Name: ALAINE LOUGHNEY MRN: [id number] Date of Birth:  10/04/1950

## 2020-11-27 ENCOUNTER — Other Ambulatory Visit: Payer: Self-pay

## 2020-11-27 ENCOUNTER — Ambulatory Visit: Payer: Medicare Other

## 2020-11-27 DIAGNOSIS — M542 Cervicalgia: Secondary | ICD-10-CM

## 2020-11-27 DIAGNOSIS — M5412 Radiculopathy, cervical region: Secondary | ICD-10-CM

## 2020-11-27 NOTE — Therapy (Signed)
[place] [facility] [address] [place], [address] Phone: [phone]   Fax:  [phone]  Physical Therapy Treatment  Patient Details  Name: Dana Morton MRN: [id number] Date of Birth: [birth year] Referring Provider (PT): Earnie Larsson, MD   Encounter Date: 11/27/2020   PT End of Session - 11/27/20 1633    Visit Number 4    Number of Visits 17    Date for PT Re-Evaluation 01/10/21    Authorization Type 4    Authorization Time Period 10    PT Start Time 1633    PT Stop Time 7824    PT Time Calculation (min) 42 min    Activity Tolerance Patient tolerated treatment well    Behavior During Therapy [facility] for tasks assessed/performed           Past Medical History:  Diagnosis Date  . Anemia    h/o   . Arthritis   . Claustrophobia   . Diverticulosis   . Fibrocystic breast disease (FCBD)   . Fibrocystic disease of breast   . GERD (gastroesophageal reflux disease)   . Herpes simplex   . HLD (hyperlipidemia)   . Hypercholesteremia   . Hyperlipidemia   . Hypertension   . Hypothyroidism   . Pre-diabetes   . Pulmonary nodules   . Pure hypercholesterolemia   . Rotator cuff tear    LEFT SHOULDER  . Sleep apnea    no cpap  . Vitamin D deficiency   . Vitamin D deficiency     Past Surgical History:  Procedure Laterality Date  . ABDOMINAL HYSTERECTOMY    . BREAST BIOPSY Left 06/19/2020   stereo biopsy/ x clip/ path pending  . BREAST EXCISIONAL BIOPSY Left 70/80s   benign  . CHOLECYSTECTOMY    . COLONOSCOPY WITH PROPOFOL N/A 03/22/2015   Procedure: COLONOSCOPY WITH PROPOFOL;  Surgeon: Hulen Luster, MD;  Location: [facility] ENDOSCOPY;  Service: Gastroenterology;  Laterality: N/A;  . DG FOOT HEEL ([place] HX)    . DILATION AND CURETTAGE OF UTERUS    . ESOPHAGOGASTRODUODENOSCOPY (EGD) WITH PROPOFOL N/A 03/22/2015   Procedure: ESOPHAGOGASTRODUODENOSCOPY (EGD) WITH PROPOFOL;  Surgeon: Hulen Luster, MD;  Location: [facility]  ENDOSCOPY;  Service: Gastroenterology;  Laterality: N/A;  . KNEE ARTHROPLASTY Right 09/23/2017   Procedure: COMPUTER ASSISTED TOTAL KNEE ARTHROPLASTY;  Surgeon: Dereck Leep, MD;  Location: [facility] ORS;  Service: Orthopedics;  Laterality: Right;  . KNEE ARTHROSCOPY Right 08/02/2015   Procedure: ARTHROSCOPY KNEE, PARTIAL MEDIAL MENISECTOMY, PLICA INCISION;  Surgeon: Hessie Knows, MD;  Location: [facility] ORS;  Service: Orthopedics;  Laterality: Right;  . KNEE ARTHROSCOPY      There were no vitals filed for this visit.   Subjective Assessment - 11/27/20 1635    Subjective Husband fell this past Friday evening. Pt tried to help her husband up. Pt hurting everywhere. L upper trap started to burn again. R upper trap burned Saturday and 1x Sunday, then has not burned since. Her back feels like it is going to go out if she does one more thing. 5/10 L upper trap pain currently. Back is 6/10 currently just above her waistline. Low back pulls when she leans forward.    Pertinent History Neck pain. Pain began several weeks ago sudden onset, no known method of injury. No imaging for her neck yet. 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 Currently doing chores such as taking trash out, take the groceries in, lifiting the hamper. Pt is  R hand dominant. Used to have L hand pareshtesia. Currently does not have the hand symptoms. Pt also states having L low back pain.    Patient Stated Goals Get rid of the burning in her neck.    Currently in Pain? Yes    Pain Onset More than a month ago                                     PT Education - 11/27/20 1711    Education provided Yes    Education Details ther-ex    [employer]) Educated Patient    Methods Explanation;Demonstration;Tactile cues;Verbal cues    Comprehension Returned demonstration;Verbalized understanding             Objective   No latex allergies Blood pressure is controlled per pt.  No falls within the past 6 months. Fear of  falling does not limit her but she is more carefull   [employer] Code [email or web address]   Manual therapy  Seated STM cervical paraspinal muscles and R upper trap area to decrease tensionand fascial restrictions  Decreased L upper trap pain afterwards    Therapeutic exercise   Seated B scapular retraction10x5 seconds for 2 sets  Then manually resisted targeting lower trap muscles  R 10x5 seconds L 10x5 seconds     Seated manually resisted L lateral shift isometrics Lumbar area at neutral to help promote posture for neck as well 10x2 with 5 second holds  Seated scapular depression, forearm at arm rest resistance   L 10x5 seconds for 2 sets  R 10x5 seconds for 2 sets   Decreased neck pain with cervical rotation  Seated manually resisted L cervical lateral shift isometrics in neutral 10x with 5 seconds    Improved exercise technique, movement at target joints, use of target muscles after mod verbal, visual, tactile cues.  Response to treatment Decreased pain with treatment to decrease upper trap and cervical paraspinal muscle tension. Pt will benefit from continued skilled physical therapy services to decrease pain, improve strength and function.           PT Short Term Goals - 11/13/20 2029      PT SHORT TERM GOAL #1   Title Patient will be independent with her initial HEP to improve ability to turn her head without pain.    Time 3    Period Weeks    Status New    Target Date 11/15/20             PT Long Term Goals - 11/13/20 2031      PT LONG TERM GOAL #1   Title Pt will have a decrease in neck pain to 2/10 or less at worst to promote ability to turn her head more comfortably.    Baseline 9/10 neck pain at most for the past 3 months (11/14/2018)    Time 8    Period Weeks    Status New    Target Date 01/10/21      PT LONG TERM GOAL #2   Title Pt will improve her neck FOTO by at  least 10 points as a demonstration of improved function.    Baseline Neck FOTO 57 (11/13/2020)    Time 8    Period Weeks    Status New    Target Date 01/10/21                 Plan -  11/27/20 1846    Clinical Impression Statement Decreased pain with treatment to decrease upper trap and cervical paraspinal muscle tension. Pt will benefit from continued skilled physical therapy services to decrease pain, improve strength and function.    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   Comorbidities Arthritis, HTN    Examination-Activity Limitations Other   looking around   Stability/Clinical Decision Making Stable/Uncomplicated    Rehab Potential Fair    PT Frequency 2x / week    PT Duration 8 weeks    PT Treatment/Interventions Therapeutic activities;Therapeutic exercise;Neuromuscular re-education;Manual techniques;Dry needling;Electrical Stimulation;Iontophoresis 4mg /ml Dexamethasone;Traction    PT Next Visit Plan scapular strengthening, posture, manual techniques, modalities PRN    Consulted and Agree with Plan of Care Patient           Patient will benefit from skilled therapeutic intervention in order to improve the following deficits and impairments:  Pain,Postural dysfunction,Improper body mechanics  Visit Diagnosis: Cervicalgia  Radiculopathy, cervical region     Problem List Patient Active Problem List   Diagnosis Date Noted  . S/P total knee arthroplasty 09/23/2017  . Diabetes mellitus type 2, uncomplicated ([place]) 03/54/6568  . Hypertension 09/22/2017  . Hypothyroidism, unspecified 09/22/2017  . Vitamin D deficiency, unspecified 09/22/2017  . Edema of both legs 03/03/2017  . BMI 40.0-44.9, adult ([place]) 12/03/2015  . Pure hypercholesterolemia 05/26/2014    Joneen Boers PT, DPT   11/27/2020, 6:51 PM  [place] [facility] [address] [place], [address] Phone: [phone]   Fax:  [phone]  Name: Dana Morton MRN: [id number] Date of Birth: Dec 30, 1950

## 2020-12-04 ENCOUNTER — Ambulatory Visit: Payer: Medicare Other

## 2020-12-04 ENCOUNTER — Other Ambulatory Visit: Payer: Self-pay

## 2020-12-04 DIAGNOSIS — M542 Cervicalgia: Secondary | ICD-10-CM | POA: Diagnosis not present

## 2020-12-04 DIAGNOSIS — M5412 Radiculopathy, cervical region: Secondary | ICD-10-CM

## 2020-12-04 NOTE — Therapy (Signed)
[place] [facility] [address] [place], [address] Phone: [phone]   Fax:  [phone]  Physical Therapy Treatment  Patient Details  Name: Dana Morton MRN: [id number] Date of Birth: 01/10/1951 Referring Provider (PT): Earnie Larsson, MD   Encounter Date: 12/04/2020   PT End of Session - 12/04/20 1410    Visit Number 5    Number of Visits 17    Date for PT Re-Evaluation 01/10/21    PT Start Time 1353   Pt checked in late   PT Stop Time 1425    PT Time Calculation (min) 32 min    Activity Tolerance Patient tolerated treatment well    Behavior During Therapy [facility] for tasks assessed/performed           Past Medical History:  Diagnosis Date  . Anemia    h/o   . Arthritis   . Claustrophobia   . Diverticulosis   . Fibrocystic breast disease (FCBD)   . Fibrocystic disease of breast   . GERD (gastroesophageal reflux disease)   . Herpes simplex   . HLD (hyperlipidemia)   . Hypercholesteremia   . Hyperlipidemia   . Hypertension   . Hypothyroidism   . Pre-diabetes   . Pulmonary nodules   . Pure hypercholesterolemia   . Rotator cuff tear    LEFT SHOULDER  . Sleep apnea    no cpap  . Vitamin D deficiency   . Vitamin D deficiency     Past Surgical History:  Procedure Laterality Date  . ABDOMINAL HYSTERECTOMY    . BREAST BIOPSY Left 06/19/2020   stereo biopsy/ x clip/ path pending  . BREAST EXCISIONAL BIOPSY Left 70/80s   benign  . CHOLECYSTECTOMY    . COLONOSCOPY WITH PROPOFOL N/A 03/22/2015   Procedure: COLONOSCOPY WITH PROPOFOL;  Surgeon: Hulen Luster, MD;  Location: [facility] ENDOSCOPY;  Service: Gastroenterology;  Laterality: N/A;  . DG FOOT HEEL ([place] HX)    . DILATION AND CURETTAGE OF UTERUS    . ESOPHAGOGASTRODUODENOSCOPY (EGD) WITH PROPOFOL N/A 03/22/2015   Procedure: ESOPHAGOGASTRODUODENOSCOPY (EGD) WITH PROPOFOL;  Surgeon: Hulen Luster, MD;  Location: [facility] ENDOSCOPY;  Service: Gastroenterology;   Laterality: N/A;  . KNEE ARTHROPLASTY Right 09/23/2017   Procedure: COMPUTER ASSISTED TOTAL KNEE ARTHROPLASTY;  Surgeon: Dereck Leep, MD;  Location: [facility] ORS;  Service: Orthopedics;  Laterality: Right;  . KNEE ARTHROSCOPY Right 08/02/2015   Procedure: ARTHROSCOPY KNEE, PARTIAL MEDIAL MENISECTOMY, PLICA INCISION;  Surgeon: Hessie Knows, MD;  Location: [facility] ORS;  Service: Orthopedics;  Laterality: Right;  . KNEE ARTHROSCOPY      There were no vitals filed for this visit.   Subjective Assessment - 12/04/20 1356    Subjective Pt still in pain exacerbation after helpign husabdn up from floor. Stil working on her HEP retraction. No other updates since past visit.    Pertinent History Neck pain. Pain began several weeks ago sudden onset, no known method of injury. No imaging for her neck yet. 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 Currently doing chores such as taking trash out, take the groceries in, lifiting the hamper. Pt is R hand dominant. Used to have L hand pareshtesia. Currently does not have the hand symptoms. Pt also states having L low back pain.    Patient Stated Goals Get rid of the burning in her neck.    Currently in Pain? Yes    Pain Score 5    Near Left origin of  levator scap          INTERVENTION THIS DATE:  -Hooklying MFR to Rt levator scap 5x60sec (legs supported to maximize comfort 2/2 low back pain baseline)  -MFR Rt levator + Cervical rotation Right x5, Left x5, AA/ROM Left shoulder flexion x5  -Cervical retraction into 2 pillows 15x3secH  -Seated scapular retraction x15 (3secH)  -Seated scapular elevation x15 (3secH) -Standing BUE shoulder ABDCT x10 -Seated Blue T-band row 2x15 (cues for end-range retraction)        PT Short Term Goals - 11/13/20 2029      PT SHORT TERM GOAL #1   Title Patient will be independent with her initial HEP to improve ability to turn her head without pain.    Time 3    Period Weeks    Status New    Target Date 11/15/20              PT Long Term Goals - 11/13/20 2031      PT LONG TERM GOAL #1   Title Pt will have a decrease in neck pain to 2/10 or less at worst to promote ability to turn her head more comfortably.    Baseline 9/10 neck pain at most for the past 3 months (11/14/2018)    Time 8    Period Weeks    Status New    Target Date 01/10/21      PT LONG TERM GOAL #2   Title Pt will improve her neck FOTO by at least 10 points as a demonstration of improved function.    Baseline Neck FOTO 57 (11/13/2020)    Time 8    Period Weeks    Status New    Target Date 01/10/21                 Plan - 12/04/20 1411    Clinical Impression Statement Continued with current plan of care as laid out in evaluation and recent prior sessions. Pt remains motivated to advance progress toward goals. Rest breaks provided as needed, pt quick to ask when needed. Author maintains all interventions within appropriate level of intensity as not to purposefully exacerbate pain. Pt reports reduction in pain from 5 to 2 by end of session. Pt does require varying levels of assistance and cuing for completion of exercises for correct form and sometimes due to pain/weakness. Pt continues to demonstrate progress toward goals AEB progression of some interventions this date either in volume or intensity. No updates to HEP this date.    Personal Factors and Comorbidities Age;Comorbidity 2;Fitness;Past/Current Experience;Other    Comorbidities Arthritis, HTN    Examination-Activity Limitations Other    Stability/Clinical Decision Making Stable/Uncomplicated    Clinical Decision Making Low    Rehab Potential Fair    Clinical Impairments Affecting Rehab Potential pain, insurance    PT Frequency 2x / week    PT Duration 8 weeks    PT Treatment/Interventions Therapeutic activities;Therapeutic exercise;Neuromuscular re-education;Manual techniques;Dry needling;Electrical Stimulation;Iontophoresis 4mg /ml Dexamethasone;Traction    PT Next Visit  Plan scapular strengthening, posture, manual techniques, modalities PRN    PT Home Exercise Plan Scapular retraction holds    Consulted and Agree with Plan of Care Patient           Patient will benefit from skilled therapeutic intervention in order to improve the following deficits and impairments:  Pain,Postural dysfunction,Improper body mechanics  Visit Diagnosis: Cervicalgia  Radiculopathy, cervical region     Problem List Patient Active Problem List   Diagnosis  Date Noted  . S/P total knee arthroplasty 09/23/2017  . Diabetes mellitus type 2, uncomplicated ([place]) 61/90/1222  . Hypertension 09/22/2017  . Hypothyroidism, unspecified 09/22/2017  . Vitamin D deficiency, unspecified 09/22/2017  . Edema of both legs 03/03/2017  . BMI 40.0-44.9, adult ([place]) 12/03/2015  . Pure hypercholesterolemia 05/26/2014   2:23 PM, 12/04/20 Etta Grandchild, PT, DPT Physical Therapist - [place] [phone] (Office)    Etta Grandchild 12/04/2020, 2:14 PM  [place] [facility] [address] [place], [address] Phone: [phone]   Fax:  [phone]  Name: Dana Morton MRN: [id number] Date of Birth: May 10, 1951

## 2020-12-06 ENCOUNTER — Ambulatory Visit: Payer: Medicare Other

## 2020-12-06 ENCOUNTER — Other Ambulatory Visit: Payer: Self-pay

## 2020-12-06 DIAGNOSIS — M5412 Radiculopathy, cervical region: Secondary | ICD-10-CM

## 2020-12-06 DIAGNOSIS — M542 Cervicalgia: Secondary | ICD-10-CM | POA: Diagnosis not present

## 2020-12-06 NOTE — Therapy (Signed)
[facility] [address] [place], [address] Phone: [phone]   Fax:  [phone]  Physical Therapy Treatment  Patient Details  Name: Dana Morton MRN: [id number] Date of Birth: 07-05-1951 Referring Provider (PT): Earnie Larsson, MD   Encounter Date: 12/06/2020   PT End of Session - 12/06/20 1633    Visit Number 6    Number of Visits 17    Date for PT Re-Evaluation 01/10/21    Authorization Type 6    Authorization Time Period 10    PT Start Time 1633    PT Stop Time 1712    PT Time Calculation (min) 39 min    Activity Tolerance Patient tolerated treatment well    Behavior During Therapy [facility] for tasks assessed/performed           Past Medical History:  Diagnosis Date  . Anemia    h/o   . Arthritis   . Claustrophobia   . Diverticulosis   . Fibrocystic breast disease (FCBD)   . Fibrocystic disease of breast   . GERD (gastroesophageal reflux disease)   . Herpes simplex   . HLD (hyperlipidemia)   . Hypercholesteremia   . Hyperlipidemia   . Hypertension   . Hypothyroidism   . Pre-diabetes   . Pulmonary nodules   . Pure hypercholesterolemia   . Rotator cuff tear    LEFT SHOULDER  . Sleep apnea    no cpap  . Vitamin D deficiency   . Vitamin D deficiency     Past Surgical History:  Procedure Laterality Date  . ABDOMINAL HYSTERECTOMY    . BREAST BIOPSY Left 06/19/2020   stereo biopsy/ x clip/ path pending  . BREAST EXCISIONAL BIOPSY Left 70/80s   benign  . CHOLECYSTECTOMY    . COLONOSCOPY WITH PROPOFOL N/A 03/22/2015   Procedure: COLONOSCOPY WITH PROPOFOL;  Surgeon: Hulen Luster, MD;  Location: [facility] ENDOSCOPY;  Service: Gastroenterology;  Laterality: N/A;  . DG FOOT HEEL ([place] HX)    . DILATION AND CURETTAGE OF UTERUS    . ESOPHAGOGASTRODUODENOSCOPY (EGD) WITH PROPOFOL N/A 03/22/2015   Procedure: ESOPHAGOGASTRODUODENOSCOPY (EGD) WITH PROPOFOL;  Surgeon: Hulen Luster, MD;  Location: [facility]  ENDOSCOPY;  Service: Gastroenterology;  Laterality: N/A;  . KNEE ARTHROPLASTY Right 09/23/2017   Procedure: COMPUTER ASSISTED TOTAL KNEE ARTHROPLASTY;  Surgeon: Dereck Leep, MD;  Location: [facility] ORS;  Service: Orthopedics;  Laterality: Right;  . KNEE ARTHROSCOPY Right 08/02/2015   Procedure: ARTHROSCOPY KNEE, PARTIAL MEDIAL MENISECTOMY, PLICA INCISION;  Surgeon: Hessie Knows, MD;  Location: [facility] ORS;  Service: Orthopedics;  Laterality: Right;  . KNEE ARTHROSCOPY      There were no vitals filed for this visit.   Subjective Assessment - 12/06/20 1636    Subjective Back (R low back) has hurting more since Tuesday. Neck has not been burning. Feels better. No pain in neck right now.    Pertinent History Neck pain. Pain began several weeks ago sudden onset, no known method of injury. No imaging for her neck yet. 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 Currently doing chores such as taking trash out, take the groceries in, lifiting the hamper. Pt is R hand dominant. Used to have L hand pareshtesia. Currently does not have the hand symptoms. Pt also states having L low back pain.    Patient Stated Goals Get rid of the burning in her neck.    Currently in Pain? Yes   3/10 R low back,  no pain in neck currently                                    PT Education - 12/06/20 1652    Education provided Yes    Education Details ther-ex    [employer]) Educated Patient    Methods Explanation;Demonstration;Tactile cues;Verbal cues    Comprehension Returned demonstration;Verbalized understanding            Objective   No latex allergies Blood pressure is controlled per pt.  No falls within the past 6 months. Fear of falling does not limit her but she is more carefull   [employer] Code [email or web address]   Manual therapy  Seated STM cervical paraspinal muscles and R upper trap area to decrease tensionand fascial restrictions     Therapeutic exercise  Seated  manually resisted L lateral shift isometrics lumbar area at neutral to help promote posture for neck as well 10x  with 5 second holds then 6x5 seconds    No decrease in back pain  Seated B scapular retraction10x5 seconds for 2 sets  Then manually resisted targeting lower trap muscles  R 10x5 seconds for 2 sets L 10x5 seconds for 2 sets                                      Seated scapular depression, forearm at arm rest resistance              L 10x5 seconds for 2 sets             R 10x5 seconds for 2 sets             Seated chin tucks 10x5 seconds for 2 sets  Improved exercise technique, movement at target joints, use of target muscles after mod verbal, visual, tactile cues.  Response to treatment/Clinical impression Good carry over of decreased neck pain from previous session. Continued working on scapular strengthening and decreasing muscle tension at neck and upper trap area. No neck or back pain after session reported by pt. Pt will benefit from continued skilled physical therapy services to decrease pain, improve strength and function.             PT Short Term Goals - 11/13/20 2029      PT SHORT TERM GOAL #1   Title Patient will be independent with her initial HEP to improve ability to turn her head without pain.    Time 3    Period Weeks    Status New    Target Date 11/15/20             PT Long Term Goals - 11/13/20 2031      PT LONG TERM GOAL #1   Title Pt will have a decrease in neck pain to 2/10 or less at worst to promote ability to turn her head more comfortably.    Baseline 9/10 neck pain at most for the past 3 months (11/14/2018)    Time 8    Period Weeks    Status New    Target Date 01/10/21      PT LONG TERM GOAL #2   Title Pt will improve her neck FOTO by at least 10 points as a demonstration of improved function.    Baseline Neck FOTO 57 (11/13/2020)    Time 8  Period Weeks     Status New    Target Date 01/10/21                 Plan - 12/06/20 1818    Clinical Impression Statement Good carry over of decreased neck pain from previous session. Continued working on scapular strengthening and decreasing muscle tension at neck and upper trap area. No neck or back pain after session reported by pt. Pt will benefit from continued skilled physical therapy services to decrease pain, improve strength and function.    Personal Factors and Comorbidities Age;Comorbidity 2;Fitness;Past/Current Experience;Other    Comorbidities Arthritis, HTN    Examination-Activity Limitations Other    Stability/Clinical Decision Making Stable/Uncomplicated    Rehab Potential Fair    Clinical Impairments Affecting Rehab Potential pain, insurance    PT Frequency 2x / week    PT Duration 8 weeks    PT Treatment/Interventions Therapeutic activities;Therapeutic exercise;Neuromuscular re-education;Manual techniques;Dry needling;Electrical Stimulation;Iontophoresis 4mg /ml Dexamethasone;Traction    PT Next Visit Plan scapular strengthening, posture, manual techniques, modalities PRN    PT Home Exercise Plan Scapular retraction holds    Consulted and Agree with Plan of Care Patient           Patient will benefit from skilled therapeutic intervention in order to improve the following deficits and impairments:  Pain,Postural dysfunction,Improper body mechanics  Visit Diagnosis: Cervicalgia  Radiculopathy, cervical region     Problem List Patient Active Problem List   Diagnosis Date Noted  . S/P total knee arthroplasty 09/23/2017  . Diabetes mellitus type 2, uncomplicated ([place]) 74/82/7078  . Hypertension 09/22/2017  . Hypothyroidism, unspecified 09/22/2017  . Vitamin D deficiency, unspecified 09/22/2017  . Edema of both legs 03/03/2017  . BMI 40.0-44.9, adult ([place]) 12/03/2015  . Pure hypercholesterolemia 05/26/2014    Joneen Boers PT, DPT   12/06/2020, 6:19 PM  [facility] [address] [place], [address] Phone: [phone]   Fax:  [phone]  Name: Dana Morton MRN: [id number] Date of Birth: 09-Apr-1951

## 2020-12-12 ENCOUNTER — Ambulatory Visit: Payer: Medicare Other | Attending: Neurosurgery

## 2020-12-12 ENCOUNTER — Other Ambulatory Visit: Payer: Self-pay

## 2020-12-12 DIAGNOSIS — M542 Cervicalgia: Secondary | ICD-10-CM | POA: Diagnosis present

## 2020-12-12 DIAGNOSIS — M5412 Radiculopathy, cervical region: Secondary | ICD-10-CM | POA: Diagnosis present

## 2020-12-12 NOTE — Therapy (Signed)
[facility] [address] [place], [address] Phone: [phone]   Fax:  [phone]  Physical Therapy Treatment  Patient Details  Name: Dana Morton MRN: [id number] Date of Birth: Apr 14, 1951 Referring Provider (PT): Earnie Larsson, MD   Encounter Date: 12/12/2020   PT End of Session - 12/12/20 1400    Visit Number 7    Number of Visits 17    Date for PT Re-Evaluation 01/10/21    Authorization Type 7    Authorization Time Period 10    PT Start Time 1400    PT Stop Time 1447    PT Time Calculation (min) 47 min    Activity Tolerance Patient tolerated treatment well    Behavior During Therapy [facility] for tasks assessed/performed           Past Medical History:  Diagnosis Date  . Anemia    h/o   . Arthritis   . Claustrophobia   . Diverticulosis   . Fibrocystic breast disease (FCBD)   . Fibrocystic disease of breast   . GERD (gastroesophageal reflux disease)   . Herpes simplex   . HLD (hyperlipidemia)   . Hypercholesteremia   . Hyperlipidemia   . Hypertension   . Hypothyroidism   . Pre-diabetes   . Pulmonary nodules   . Pure hypercholesterolemia   . Rotator cuff tear    LEFT SHOULDER  . Sleep apnea    no cpap  . Vitamin D deficiency   . Vitamin D deficiency     Past Surgical History:  Procedure Laterality Date  . ABDOMINAL HYSTERECTOMY    . BREAST BIOPSY Left 06/19/2020   stereo biopsy/ x clip/ path pending  . BREAST EXCISIONAL BIOPSY Left 70/80s   benign  . CHOLECYSTECTOMY    . COLONOSCOPY WITH PROPOFOL N/A 03/22/2015   Procedure: COLONOSCOPY WITH PROPOFOL;  Surgeon: Hulen Luster, MD;  Location: [facility] ENDOSCOPY;  Service: Gastroenterology;  Laterality: N/A;  . DG FOOT HEEL ([place] HX)    . DILATION AND CURETTAGE OF UTERUS    . ESOPHAGOGASTRODUODENOSCOPY (EGD) WITH PROPOFOL N/A 03/22/2015   Procedure: ESOPHAGOGASTRODUODENOSCOPY (EGD) WITH PROPOFOL;  Surgeon: Hulen Luster, MD;  Location: [facility]  ENDOSCOPY;  Service: Gastroenterology;  Laterality: N/A;  . KNEE ARTHROPLASTY Right 09/23/2017   Procedure: COMPUTER ASSISTED TOTAL KNEE ARTHROPLASTY;  Surgeon: Dereck Leep, MD;  Location: [facility] ORS;  Service: Orthopedics;  Laterality: Right;  . KNEE ARTHROSCOPY Right 08/02/2015   Procedure: ARTHROSCOPY KNEE, PARTIAL MEDIAL MENISECTOMY, PLICA INCISION;  Surgeon: Hessie Knows, MD;  Location: [facility] ORS;  Service: Orthopedics;  Laterality: Right;  . KNEE ARTHROSCOPY      There were no vitals filed for this visit.   Subjective Assessment - 12/12/20 1402    Subjective Neck has not been hurting bad, but back has been hurting. 3/10 currently neck pain (8/10 at most for the past 7 days at the upper trap area). BAck is about a 7/10 currently. Felt pretty good after last session. Had muscle soreness after last session but nothing she couldn't deal with.    Pertinent History Neck pain. Pain began several weeks ago sudden onset, no known method of injury. No imaging for her neck yet. 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 Currently doing chores such as taking trash out, take the groceries in, lifiting the hamper. Pt is R hand dominant. Used to have L hand pareshtesia. Currently does not have the hand symptoms. Pt also states having L  low back pain.    Patient Stated Goals Get rid of the burning in her neck.    Currently in Pain? Yes    Pain Score 3    neck pain; 7/10 back pain.                                    PT Education - 12/12/20 1416    Education provided Yes    Education Details ther-ex    [employer]) Educated Patient    Methods Explanation;Demonstration;Tactile cues;Verbal cues    Comprehension Returned demonstration;Verbalized understanding           Objective   No latex allergies Blood pressure is controlled per pt.  No falls within the past 6 months. Fear of falling does not limit her but she is more carefull   [employer] Code [email or web address]     Therapeutic exercise  Reclined   with posterior pelvic tilt 2x5 seconds. Uncomfortable for low back. Eases with rest    B scapular retraction 10x5 seconds for 3 sets   Cervical nodding 10x2 with 5 second holds   Chin tucks 4x5 seconds. L low back pain. Eases with res   Deep cervical flexion 10x    Transversus abdominis contraction 3x5 seconds. Slight increase in low back pain, eases with rest   glute max squeeze 7x5 seconds   Sitting on upright chair: more comfortable for pt compared to the reclined position.   Then with lumbar towel roll: no back pain afterwards.   Cervical rotation R and L: no pain.   Seated manually resisted targeting lower trap muscles  R 10x5 seconds for 2 sets L 10x5 seconds for 2 sets  Seated scapular depression, forearm at arm rest resistance  L 10x5 seconds for 2 sets R 10x5 seconds for 2 sets   Sitting with back resting on chair back rest:  Gentle manually resisted trunk extension isometrics 10x5 seconds.   Improved exercise technique, movement at target joints, use of target muscles after mod verbal, visual, tactile cues.  Response to treatment/Clinical impression Difficulty performing neck exercises secondary to low back pain. Irritable back symptoms today Pt demonstrates preference for more upright/extension direction. Decreased low back pain with sitting in upright posture with thoracolumbar towel roll. Decreased neck pain to 2/10 and low back pain to 6/10 at end of session. Decreased low back pain to 2/10 when standing. Challenges to progress include back pain which interferes with neck exercises. Pt will benefit from continued skilled physical therapy services to decrease pain, improve strength and function.         PT Short Term Goals - 11/13/20 2029      PT SHORT TERM GOAL #1   Title Patient will be independent  with her initial HEP to improve ability to turn her head without pain.    Time 3    Period Weeks    Status New    Target Date 11/15/20             PT Long Term Goals - 11/13/20 2031      PT LONG TERM GOAL #1   Title Pt will have a decrease in neck pain to 2/10 or less at worst to promote ability to turn her head more comfortably.    Baseline 9/10 neck pain at most for the past 3 months (11/14/2018)    Time 8    Period Weeks    Status New  Target Date 01/10/21      PT LONG TERM GOAL #2   Title Pt will improve her neck FOTO by at least 10 points as a demonstration of improved function.    Baseline Neck FOTO 57 (11/13/2020)    Time 8    Period Weeks    Status New    Target Date 01/10/21                 Plan - 12/12/20 1355    Clinical Impression Statement Difficulty performing neck exercises secondary to low back pain. Irritable back symptoms today Pt demonstrates preference for more upright/extension direction. Decreased low back pain with sitting in upright posture with thoracolumbar towel roll. Decreased neck pain to 2/10 and low back pain to 6/10 at end of session. Decreased low back pain to 2/10 when standing. Challenges to progress include back pain which interferes with neck exercises. Pt will benefit from continued skilled physical therapy services to decrease pain, improve strength and function.    Personal Factors and Comorbidities Age;Comorbidity 2;Fitness;Past/Current Experience;Other    Comorbidities Arthritis, HTN    Examination-Activity Limitations Other    Stability/Clinical Decision Making Stable/Uncomplicated    Clinical Decision Making Low    Rehab Potential Fair    Clinical Impairments Affecting Rehab Potential pain, insurance    PT Frequency 2x / week    PT Duration 8 weeks    PT Treatment/Interventions Therapeutic activities;Therapeutic exercise;Neuromuscular re-education;Manual techniques;Dry needling;Electrical Stimulation;Iontophoresis 4mg /ml  Dexamethasone;Traction    PT Next Visit Plan scapular strengthening, posture, manual techniques, modalities PRN    PT Home Exercise Plan Scapular retraction holds    Consulted and Agree with Plan of Care Patient           Patient will benefit from skilled therapeutic intervention in order to improve the following deficits and impairments:  Pain,Postural dysfunction,Improper body mechanics  Visit Diagnosis: Cervicalgia  Radiculopathy, cervical region     Problem List Patient Active Problem List   Diagnosis Date Noted  . S/P total knee arthroplasty 09/23/2017  . Diabetes mellitus type 2, uncomplicated ([place]) 56/15/3794  . Hypertension 09/22/2017  . Hypothyroidism, unspecified 09/22/2017  . Vitamin D deficiency, unspecified 09/22/2017  . Edema of both legs 03/03/2017  . BMI 40.0-44.9, adult ([place]) 12/03/2015  . Pure hypercholesterolemia 05/26/2014    Joneen Boers PT, DPT   12/12/2020, 3:09 PM  [facility] [address] [place], [address] Phone: [phone]   Fax:  [phone]  Name: Dana Morton MRN: [id number] Date of Birth: 1950/09/13

## 2020-12-18 ENCOUNTER — Ambulatory Visit: Payer: Medicare Other

## 2020-12-18 ENCOUNTER — Other Ambulatory Visit: Payer: Self-pay

## 2020-12-18 DIAGNOSIS — M5412 Radiculopathy, cervical region: Secondary | ICD-10-CM

## 2020-12-18 DIAGNOSIS — M542 Cervicalgia: Secondary | ICD-10-CM

## 2020-12-18 NOTE — Therapy (Signed)
[facility] [address], [place], [address] Phone: [phone]   Fax:  [phone]  Physical Therapy Treatment  Patient Details  Name: Dana Morton MRN: [id number] Date of Birth: 1950/10/15 Referring Provider (PT): Earnie Larsson, MD   Encounter Date: 12/18/2020   PT End of Session - 12/18/20 1503    Visit Number 8    Number of Visits 17    Date for PT Re-Evaluation 01/10/21    Authorization Type 8    Authorization Time Period 10    PT Start Time 1503    PT Stop Time 1546    PT Time Calculation (min) 43 min    Activity Tolerance Patient tolerated treatment well    Behavior During Therapy [facility] for tasks assessed/performed           Past Medical History:  Diagnosis Date  . Anemia    h/o   . Arthritis   . Claustrophobia   . Diverticulosis   . Fibrocystic breast disease (FCBD)   . Fibrocystic disease of breast   . GERD (gastroesophageal reflux disease)   . Herpes simplex   . HLD (hyperlipidemia)   . Hypercholesteremia   . Hyperlipidemia   . Hypertension   . Hypothyroidism   . Pre-diabetes   . Pulmonary nodules   . Pure hypercholesterolemia   . Rotator cuff tear    LEFT SHOULDER  . Sleep apnea    no cpap  . Vitamin D deficiency   . Vitamin D deficiency     Past Surgical History:  Procedure Laterality Date  . ABDOMINAL HYSTERECTOMY    . BREAST BIOPSY Left 06/19/2020   stereo biopsy/ x clip/ path pending  . BREAST EXCISIONAL BIOPSY Left 70/80s   benign  . CHOLECYSTECTOMY    . COLONOSCOPY WITH PROPOFOL N/A 03/22/2015   Procedure: COLONOSCOPY WITH PROPOFOL;  Surgeon: Hulen Luster, MD;  Location: [facility] ENDOSCOPY;  Service: Gastroenterology;  Laterality: N/A;  . DG FOOT HEEL ([place] HX)    . DILATION AND CURETTAGE OF UTERUS    . ESOPHAGOGASTRODUODENOSCOPY (EGD) WITH PROPOFOL N/A 03/22/2015   Procedure: ESOPHAGOGASTRODUODENOSCOPY (EGD) WITH PROPOFOL;  Surgeon: Hulen Luster, MD;  Location: [facility]  ENDOSCOPY;  Service: Gastroenterology;  Laterality: N/A;  . KNEE ARTHROPLASTY Right 09/23/2017   Procedure: COMPUTER ASSISTED TOTAL KNEE ARTHROPLASTY;  Surgeon: Dereck Leep, MD;  Location: [facility] ORS;  Service: Orthopedics;  Laterality: Right;  . KNEE ARTHROSCOPY Right 08/02/2015   Procedure: ARTHROSCOPY KNEE, PARTIAL MEDIAL MENISECTOMY, PLICA INCISION;  Surgeon: Hessie Knows, MD;  Location: [facility] ORS;  Service: Orthopedics;  Laterality: Right;  . KNEE ARTHROSCOPY      There were no vitals filed for this visit.   Subjective Assessment - 12/18/20 1505    Subjective Neck has been painfull. Has been burning. 7/10 currently. No change in activities. Did dishes.    Pertinent History Neck pain. Pain began several weeks ago sudden onset, no known method of injury. No imaging for her neck yet. 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 Currently doing chores such as taking trash out, take the groceries in, lifiting the hamper. Pt is R hand dominant. Used to have L hand pareshtesia. Currently does not have the hand symptoms. Pt also states having L low back pain.    Patient Stated Goals Get rid of the burning in her neck.    Currently in Pain? Yes    Pain Score 7  PT Education - 12/18/20 1528    Education provided Yes    Education Details ther-ex    [employer]) Educated Patient    Methods Explanation;Demonstration;Verbal cues;Tactile cues    Comprehension Returned demonstration;Verbalized understanding          Objective   No latex allergies Blood pressure is controlled per pt.  No falls within the past 6 months. Fear of falling does not limit her but she is more carefull   [employer] Code [email or web address]   Manual therapy  Seated STM L levator scapula   Seated STM R upper trap and rhomboid  Decreased L scapular pain to 3/10 afterwards    Therapeutic exercise  Seated gentle manually resisted L shoulder shrug targeting  levator scapula 10x5 seconds for 3 sets  Seated manually resisted B scapular retraction targeting the lower trap muscles 10x3 with 5 second holds   Seated cervical flexion 10x5 seconds for 3 sets  No change in symptoms afterwards   Seated L cervical lateral shift 10x5 seconds   Then isometrics for L side to promote more neutral neck posture 10x5 seconds for 3 sets   Seated scapular depression, forearm at arm rest resistance  L 10x5 seconds for 2 sets    Improved exercise technique, movement at target joints, use of target muscles after mod verbal, visual, tactile cues.  Response to treatment/Clinical impression Decreased L lateral neck burning to 4-5/10 after sesssion. Continued working on decreasing muscle tension to neck and upper trap muscles. Pt will benefit from continued skilled physical therapy services to decrease pain, improve strength and function.      PT Short Term Goals - 11/13/20 2029      PT SHORT TERM GOAL #1   Title Patient will be independent with her initial HEP to improve ability to turn her head without pain.    Time 3    Period Weeks    Status New    Target Date 11/15/20             PT Long Term Goals - 11/13/20 2031      PT LONG TERM GOAL #1   Title Pt will have a decrease in neck pain to 2/10 or less at worst to promote ability to turn her head more comfortably.    Baseline 9/10 neck pain at most for the past 3 months (11/14/2018)    Time 8    Period Weeks    Status New    Target Date 01/10/21      PT LONG TERM GOAL #2   Title Pt will improve her neck FOTO by at least 10 points as a demonstration of improved function.    Baseline Neck FOTO 57 (11/13/2020)    Time 8    Period Weeks    Status New    Target Date 01/10/21                 Plan - 12/18/20 1530    Clinical Impression Statement Decreased L lateral neck burning to 4-5/10 after sesssion. Continued working on decreasing muscle tension to neck and upper  trap muscles. Pt will benefit from continued skilled physical therapy services to decrease pain, improve strength and function.    Personal Factors and Comorbidities Age;Comorbidity 2;Fitness;Past/Current Experience;Other    Comorbidities Arthritis, HTN    Examination-Activity Limitations Other    Stability/Clinical Decision Making Stable/Uncomplicated    Rehab Potential Fair    Clinical Impairments Affecting Rehab Potential pain, insurance    PT Frequency 2x /  week    PT Duration 8 weeks    PT Treatment/Interventions Therapeutic activities;Therapeutic exercise;Neuromuscular re-education;Manual techniques;Dry needling;Electrical Stimulation;Iontophoresis 4mg /ml Dexamethasone;Traction    PT Next Visit Plan scapular strengthening, posture, manual techniques, modalities PRN    PT Home Exercise Plan Scapular retraction holds    Consulted and Agree with Plan of Care Patient           Patient will benefit from skilled therapeutic intervention in order to improve the following deficits and impairments:  Pain,Postural dysfunction,Improper body mechanics  Visit Diagnosis: Cervicalgia  Radiculopathy, cervical region     Problem List Patient Active Problem List   Diagnosis Date Noted  . S/P total knee arthroplasty 09/23/2017  . Diabetes mellitus type 2, uncomplicated ([place]) 21/58/7276  . Hypertension 09/22/2017  . Hypothyroidism, unspecified 09/22/2017  . Vitamin D deficiency, unspecified 09/22/2017  . Edema of both legs 03/03/2017  . BMI 40.0-44.9, adult ([place]) 12/03/2015  . Pure hypercholesterolemia 05/26/2014    Joneen Boers PT, DPT   12/18/2020, 5:48 PM  [facility] [address] [place], [address] Phone: [phone]   Fax:  [phone]  Name: CATY TESSLER MRN: [id number] Date of Birth: Jan 14, 1951

## 2020-12-20 ENCOUNTER — Ambulatory Visit: Payer: Medicare Other

## 2020-12-20 ENCOUNTER — Other Ambulatory Visit: Payer: Self-pay

## 2020-12-20 DIAGNOSIS — M542 Cervicalgia: Secondary | ICD-10-CM | POA: Diagnosis not present

## 2020-12-20 DIAGNOSIS — M5412 Radiculopathy, cervical region: Secondary | ICD-10-CM

## 2020-12-20 NOTE — Therapy (Signed)
[facility] [address], [place], [address] Phone: [phone]   Fax:  [phone]  Physical Therapy Treatment  Patient Details  Name: Dana Morton MRN: [id number] Date of Birth: Jul 19, 1950 Referring Provider (PT): Earnie Larsson, MD   Encounter Date: 12/20/2020   PT End of Session - 12/20/20 1507     Visit Number 9    Number of Visits 17    Date for PT Re-Evaluation 01/10/21    Authorization Type 9    Authorization Time Period 10    PT Start Time 1507    PT Stop Time 1546    PT Time Calculation (min) 39 min    Activity Tolerance Patient tolerated treatment well    Behavior During Therapy WFL for tasks assessed/performed             Past Medical History:  Diagnosis Date   Anemia    h/o    Arthritis    Claustrophobia    Diverticulosis    Fibrocystic breast disease (FCBD)    Fibrocystic disease of breast    GERD (gastroesophageal reflux disease)    Herpes simplex    HLD (hyperlipidemia)    Hypercholesteremia    Hyperlipidemia    Hypertension    Hypothyroidism    Pre-diabetes    Pulmonary nodules    Pure hypercholesterolemia    Rotator cuff tear    LEFT SHOULDER   Sleep apnea    no cpap   Vitamin D deficiency    Vitamin D deficiency     Past Surgical History:  Procedure Laterality Date   ABDOMINAL HYSTERECTOMY     BREAST BIOPSY Left 06/19/2020   stereo biopsy/ x clip/ path pending   BREAST EXCISIONAL BIOPSY Left 70/80s   benign   CHOLECYSTECTOMY     COLONOSCOPY WITH PROPOFOL N/A 03/22/2015   Procedure: COLONOSCOPY WITH PROPOFOL;  Surgeon: Hulen Luster, MD;  Location: [facility] ENDOSCOPY;  Service: Gastroenterology;  Laterality: N/A;   DG FOOT HEEL ([facility] HX)     DILATION AND CURETTAGE OF UTERUS     ESOPHAGOGASTRODUODENOSCOPY (EGD) WITH PROPOFOL N/A 03/22/2015   Procedure: ESOPHAGOGASTRODUODENOSCOPY (EGD) WITH PROPOFOL;  Surgeon: Hulen Luster, MD;  Location: [facility] ENDOSCOPY;  Service:  Gastroenterology;  Laterality: N/A;   KNEE ARTHROPLASTY Right 09/23/2017   Procedure: COMPUTER ASSISTED TOTAL KNEE ARTHROPLASTY;  Surgeon: Dereck Leep, MD;  Location: [facility] ORS;  Service: Orthopedics;  Laterality: Right;   KNEE ARTHROSCOPY Right 08/02/2015   Procedure: ARTHROSCOPY KNEE, PARTIAL MEDIAL MENISECTOMY, PLICA INCISION;  Surgeon: Hessie Knows, MD;  Location: [facility] ORS;  Service: Orthopedics;  Laterality: Right;   KNEE ARTHROSCOPY      There were no vitals filed for this visit.   Subjective Assessment - 12/20/20 1510     Subjective Neck did not hurt yesterday afternoon. Pt was going up the back steps in the garage (4 steps, B rail) yesterday evening and her medial scapula (levator muscle origin area) started burning. No pain currently at rest and with cervical rotation or with cervical flexion and extension. 8-9/10 at worst for the past 7 days, usually hurts when she goes up and down the steps or bending over to do dishes.    Pertinent History Neck pain. Pain began several weeks ago sudden onset, no known method of injury. No imaging for her neck yet. 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 Currently doing chores such as taking trash out, take the groceries in, lifiting the hamper.  Pt is R hand dominant. Used to have L hand pareshtesia. Currently does not have the hand symptoms. Pt also states having L low back pain.    Patient Stated Goals Get rid of the burning in her neck.    Currently in Pain? No/denies                                       PT Education - 12/20/20 1546     Education provided Yes    Education Details ther-ex    [employer]) Educated Patient    Methods Explanation;Demonstration;Tactile cues;Verbal cues    Comprehension Returned demonstration;Verbalized understanding              Objective   No latex allergies Blood pressure is controlled per pt. No falls within the past 6 months. Fear of falling does not limit her but she is  more carefull   [employer] Access Code [email or web address]     Manual therapy   Seated STM B cervical paraspinal muscle to decrease tension    Seated STM R upper trap and rhomboid to decrease tension    Therapeutic exercise   Ascending and descending 4 regular steps with L rail assist to go up.   3x with 10 lbs R hand to mimic bringing groceries up her 4 steps in the garage. Pt demonstrateL scapular shrug when reaching up the rail and pulling herself up.   Seated manually resisted scapular depression isometrics 10x3 with 5 second holds  Seated L upper trap stretch 10x5 seconds  Seated R upper trap stretch 10x5 seconds  Neck discomfort and headache, decrease with rest and treatment to decrease muscle tension to neck.     Improved exercise technique, movement at target joints, use of target muscles after mod verbal, visual, tactile cues.     Response to treatment/Clinical impression Pt able to move her head and neck in different directions today without reports of neck pain. Pt demonstrated neck pain with cervical rotation during initial evaluation. Continued working on decreasing muscle tension to her neck as well as improving scapular strength. Pt will benefit from continued skilled physical therapy services to decrease pain improve strength and function. Challenge to progress include increased physical demands with ADLs at home secondary to being her husband's caregiver.        PT Short Term Goals - 11/13/20 2029       PT SHORT TERM GOAL #1   Title Patient will be independent with her initial HEP to improve ability to turn her head without pain.    Time 3    Period Weeks    Status New    Target Date 11/15/20               PT Long Term Goals - 11/13/20 2031       PT LONG TERM GOAL #1   Title Pt will have a decrease in neck pain to 2/10 or less at worst to promote ability to turn her head more comfortably.    Baseline 9/10 neck pain at most for the past 3 months (11/14/2018)     Time 8    Period Weeks    Status New    Target Date 01/10/21      PT LONG TERM GOAL #2   Title Pt will improve her neck FOTO by at least 10 points as a demonstration of improved function.    Baseline  Neck FOTO 57 (11/13/2020)    Time 8    Period Weeks    Status New    Target Date 01/10/21                   Plan - 12/20/20 1513     Clinical Impression Statement Pt able to move her head and neck in different directions today without reports of neck pain. Pt demonstrated neck pain with cervical rotation during initial evaluation. Continued working on decreasing muscle tension to her neck as well as improving scapular strength. Pt will benefit from continued skilled physical therapy services to decrease pain improve strength and function. Challenge to progress include increased physical demands with ADLs at home secondary to being her husband's caregiver.    Personal Factors and Comorbidities Age;Comorbidity 2;Fitness;Past/Current Experience;Other    Comorbidities Arthritis, HTN    Examination-Activity Limitations Other    Stability/Clinical Decision Making Stable/Uncomplicated    Rehab Potential Fair    Clinical Impairments Affecting Rehab Potential pain, insurance    PT Frequency 2x / week    PT Duration 8 weeks    PT Treatment/Interventions Therapeutic activities;Therapeutic exercise;Neuromuscular re-education;Manual techniques;Dry needling;Electrical Stimulation;Iontophoresis 4mg /ml Dexamethasone;Traction    PT Next Visit Plan scapular strengthening, posture, manual techniques, modalities PRN    PT Home Exercise Plan Scapular retraction holds    Consulted and Agree with Plan of Care Patient             Patient will benefit from skilled therapeutic intervention in order to improve the following deficits and impairments:  Pain, Postural dysfunction, Improper body mechanics  Visit Diagnosis: Cervicalgia  Radiculopathy, cervical region     Problem List Patient  Active Problem List   Diagnosis Date Noted   S/P total knee arthroplasty 09/23/2017   Diabetes mellitus type 2, uncomplicated ([place]) 32/08/3341   Hypertension 09/22/2017   Hypothyroidism, unspecified 09/22/2017   Vitamin D deficiency, unspecified 09/22/2017   Edema of both legs 03/03/2017   BMI 40.0-44.9, adult ([place]) 12/03/2015   Pure hypercholesterolemia 05/26/2014    Joneen Boers PT, DPT   12/20/2020, 6:24 PM  [facility] [address] [place], [address] Phone: [phone]   Fax:  [phone]  Name: Dana Morton MRN: [id number] Date of Birth: 01/23/1951

## 2020-12-25 ENCOUNTER — Ambulatory Visit: Payer: Medicare Other

## 2020-12-27 ENCOUNTER — Other Ambulatory Visit: Payer: Self-pay

## 2020-12-27 ENCOUNTER — Ambulatory Visit: Payer: Medicare Other

## 2020-12-27 DIAGNOSIS — M542 Cervicalgia: Secondary | ICD-10-CM | POA: Diagnosis not present

## 2020-12-27 DIAGNOSIS — M5412 Radiculopathy, cervical region: Secondary | ICD-10-CM

## 2020-12-27 NOTE — Therapy (Signed)
[facility] [address] [place], [place], [address] Phone: [phone]   Fax:  [phone]  Physical Therapy Treatment And Progress Report (11/13/2020 - 12/27/2020)  Patient Details  Name: Dana Morton MRN: [id number] Date of Birth: 1951-03-13 Referring Provider (PT): Earnie Larsson, MD   Encounter Date: 12/27/2020   PT End of Session - 12/27/20 1417     Visit Number 10    Number of Visits 17    Date for PT Re-Evaluation 01/10/21    Authorization Type 10    Authorization Time Period 10    PT Start Time 1418    PT Stop Time 7654    PT Time Calculation (min) 40 min    Activity Tolerance Patient tolerated treatment well    Behavior During Therapy WFL for tasks assessed/performed             Past Medical History:  Diagnosis Date   Anemia    h/o    Arthritis    Claustrophobia    Diverticulosis    Fibrocystic breast disease (FCBD)    Fibrocystic disease of breast    GERD (gastroesophageal reflux disease)    Herpes simplex    HLD (hyperlipidemia)    Hypercholesteremia    Hyperlipidemia    Hypertension    Hypothyroidism    Pre-diabetes    Pulmonary nodules    Pure hypercholesterolemia    Rotator cuff tear    LEFT SHOULDER   Sleep apnea    no cpap   Vitamin D deficiency    Vitamin D deficiency     Past Surgical History:  Procedure Laterality Date   ABDOMINAL HYSTERECTOMY     BREAST BIOPSY Left 06/19/2020   stereo biopsy/ x clip/ path pending   BREAST EXCISIONAL BIOPSY Left 70/80s   benign   CHOLECYSTECTOMY     COLONOSCOPY WITH PROPOFOL N/A 03/22/2015   Procedure: COLONOSCOPY WITH PROPOFOL;  Surgeon: Hulen Luster, MD;  Location: [facility] ENDOSCOPY;  Service: Gastroenterology;  Laterality: N/A;   DG FOOT HEEL ([facility] HX)     DILATION AND CURETTAGE OF UTERUS     ESOPHAGOGASTRODUODENOSCOPY (EGD) WITH PROPOFOL N/A 03/22/2015   Procedure: ESOPHAGOGASTRODUODENOSCOPY (EGD) WITH PROPOFOL;  Surgeon: Hulen Luster,  MD;  Location: [facility] ENDOSCOPY;  Service: Gastroenterology;  Laterality: N/A;   KNEE ARTHROPLASTY Right 09/23/2017   Procedure: COMPUTER ASSISTED TOTAL KNEE ARTHROPLASTY;  Surgeon: Dereck Leep, MD;  Location: [facility] ORS;  Service: Orthopedics;  Laterality: Right;   KNEE ARTHROSCOPY Right 08/02/2015   Procedure: ARTHROSCOPY KNEE, PARTIAL MEDIAL MENISECTOMY, PLICA INCISION;  Surgeon: Hessie Knows, MD;  Location: [facility] ORS;  Service: Orthopedics;  Laterality: Right;   KNEE ARTHROSCOPY      There were no vitals filed for this visit.   Subjective Assessment - 12/27/20 1419     Subjective Has R upper trap pain currently 7/10 currently. 5/10 L upper trap area currently.    Pertinent History Neck pain. Pain began several weeks ago sudden onset, no known method of injury. No imaging for her neck yet. 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 Currently doing chores such as taking trash out, take the groceries in, lifiting the hamper. Pt is R hand dominant. Used to have L hand pareshtesia. Currently does not have the hand symptoms. Pt also states having L low back pain.    Patient Stated Goals Get rid of the burning in her neck.    Currently in Pain? Yes    Pain  Score 7                                        PT Education - 12/27/20 1453     Education provided Yes    Education Details ther-ex    [employer]) Educated Patient    Methods Explanation;Demonstration;Tactile cues;Verbal cues    Comprehension Returned demonstration;Verbalized understanding             Objective   No latex allergies Blood pressure is controlled per pt. No falls within the past 6 months. Fear of falling does not limit her but she is more carefull   [employer] Access Code [email or web address]     Manual therapy   Seated STM R cervical paraspinal muscle to decrease tension    Seated STM R upper trap and rhomboid to decrease tension  Decreased R neck pain with R cervical rotation       Therapeutic  exercise    Seated manually resisted scapular retraction   R 10x3 with 5 second holds   L 10x3 with 5 second holds    Seated manually resisted scapular depression isometrics  R 10x3 with 5 second holds L 10x3 with 5 second holds.   Seated chin tucks 10x3  Seated transversus abdominis contraction 10x5 seconds      Improved exercise technique, movement at target joints, use of target muscles after mod verbal, visual, tactile cues.     Response to treatment/Clinical impression  Pt demonstrates decrease in neck pain level at worst by 2-3 points since initial evaluation. Functional scores however decreased. Pt is currently the caregiver for her husband in which care giving tasks may aggravate her neck pain. Pt also demonstrates low back and B knee pain which may also make care giving duties more challenging causing her upper trap muscles to increase in activation such as when negotiating steps when carrying a bag of groceries while reaching up for the stair rail to help pull her up the steps. Pt demonstrated shoulder shrug compensation when doing so while simulating task in the clinic.  Pt will benefit from continued skilled physical therapy services to decrease pain, improve strength and function.             PT Short Term Goals - 12/27/20 1420       PT SHORT TERM GOAL #1   Title Patient will be independent with her initial HEP to improve ability to turn her head without pain.    Baseline Has been doing her HEPno questions. (12/27/2020)    Time 3    Period Weeks    Status Achieved    Target Date 11/15/20               PT Long Term Goals - 12/27/20 1422       PT LONG TERM GOAL #1   Title Pt will have a decrease in neck pain to 2/10 or less at worst to promote ability to turn her head more comfortably.    Baseline 9/10 neck pain at most for the past 3 months (11/14/2018); 6-7/10 at most for the past 7 days (12/27/2020)    Time 8    Period Weeks    Status New      PT  LONG TERM GOAL #2   Title Pt will improve her neck FOTO by at least 10 points as a demonstration of improved function.    Baseline  Neck FOTO 57 (11/13/2020); 42 (12/27/2020)    Time 8    Period Weeks    Status On-going    Target Date 01/10/21                   Plan - 12/27/20 1452     Clinical Impression Statement Pt demonstrates decrease in neck pain level at worst by 2-3 points since initial evaluation. Functional scores however decreased. Pt is currently the caregiver for her husband in which care giving tasks may aggravate her neck pain. Pt also demonstrates low back and B knee pain which may also make care giving duties more challenging causing her upper trap muscles to increase in activation such as when negotiating steps when carrying a bag of groceries while reaching up for the stair rail to help pull her up the steps. Pt demonstrated shoulder shrug compensation when doing so while simulating task in the clinic.  Pt will benefit from continued skilled physical therapy services to decrease pain, improve strength and function.    Personal Factors and Comorbidities Age;Comorbidity 2;Fitness;Past/Current Experience;Other    Comorbidities Arthritis, HTN    Examination-Activity Limitations Other    Stability/Clinical Decision Making Stable/Uncomplicated    Rehab Potential Fair    Clinical Impairments Affecting Rehab Potential pain, insurance    PT Frequency 2x / week    PT Duration 8 weeks    PT Treatment/Interventions Therapeutic activities;Therapeutic exercise;Neuromuscular re-education;Manual techniques;Dry needling;Electrical Stimulation;Iontophoresis 4mg /ml Dexamethasone;Traction    PT Next Visit Plan scapular strengthening, posture, manual techniques, modalities PRN    PT Home Exercise Plan Scapular retraction holds    Consulted and Agree with Plan of Care Patient             Patient will benefit from skilled therapeutic intervention in order to improve the following  deficits and impairments:  Pain, Postural dysfunction, Improper body mechanics  Visit Diagnosis: Cervicalgia  Radiculopathy, cervical region     Problem List Patient Active Problem List   Diagnosis Date Noted   S/P total knee arthroplasty 09/23/2017   Diabetes mellitus type 2, uncomplicated ([place]) 17/91/5056   Hypertension 09/22/2017   Hypothyroidism, unspecified 09/22/2017   Vitamin D deficiency, unspecified 09/22/2017   Edema of both legs 03/03/2017   BMI 40.0-44.9, adult ([place]) 12/03/2015   Pure hypercholesterolemia 05/26/2014    Thank you for your referral.  Joneen Boers PT, DPT   12/27/2020, 7:25 PM  [facility] [address], [place], [address] Phone: [phone]   Fax:  [phone]  Name: Dana Morton MRN: [id number] Date of Birth: November 04, 1950

## 2021-01-01 ENCOUNTER — Ambulatory Visit: Payer: Medicare Other

## 2021-01-01 ENCOUNTER — Other Ambulatory Visit: Payer: Self-pay

## 2021-01-01 DIAGNOSIS — M542 Cervicalgia: Secondary | ICD-10-CM

## 2021-01-01 DIAGNOSIS — M5412 Radiculopathy, cervical region: Secondary | ICD-10-CM

## 2021-01-01 NOTE — Patient Instructions (Signed)
  Seated cervical flexion isometrics   Sitting on a chair, retract your neck comfortably and place your thumbs under your chin.    Gently press your chin against your thumbs (your thumbs do not let your head move)   Hold for 5 seconds    Repeat 10 times,    Perform 3 sets daily.

## 2021-01-01 NOTE — Therapy (Signed)
[facility] [address], [place], [address] Phone: [phone]   Fax:  [phone]  Physical Therapy Treatment  Patient Details  Name: Dana Morton MRN: [id number] Date of Birth: 1951/01/26 Referring Provider (PT): Earnie Larsson, MD   Encounter Date: 01/01/2021   PT End of Session - 01/01/21 1425     Visit Number 11    Number of Visits 17    Date for PT Re-Evaluation 01/10/21    Authorization Type 1    Authorization Time Period 10    PT Start Time 1425    PT Stop Time 1512    PT Time Calculation (min) 47 min    Activity Tolerance Patient tolerated treatment well    Behavior During Therapy WFL for tasks assessed/performed             Past Medical History:  Diagnosis Date   Anemia    h/o    Arthritis    Claustrophobia    Diverticulosis    Fibrocystic breast disease (FCBD)    Fibrocystic disease of breast    GERD (gastroesophageal reflux disease)    Herpes simplex    HLD (hyperlipidemia)    Hypercholesteremia    Hyperlipidemia    Hypertension    Hypothyroidism    Pre-diabetes    Pulmonary nodules    Pure hypercholesterolemia    Rotator cuff tear    LEFT SHOULDER   Sleep apnea    no cpap   Vitamin D deficiency    Vitamin D deficiency     Past Surgical History:  Procedure Laterality Date   ABDOMINAL HYSTERECTOMY     BREAST BIOPSY Left 06/19/2020   stereo biopsy/ x clip/ path pending   BREAST EXCISIONAL BIOPSY Left 70/80s   benign   CHOLECYSTECTOMY     COLONOSCOPY WITH PROPOFOL N/A 03/22/2015   Procedure: COLONOSCOPY WITH PROPOFOL;  Surgeon: Hulen Luster, MD;  Location: [facility] ENDOSCOPY;  Service: Gastroenterology;  Laterality: N/A;   DG FOOT HEEL ([facility] HX)     DILATION AND CURETTAGE OF UTERUS     ESOPHAGOGASTRODUODENOSCOPY (EGD) WITH PROPOFOL N/A 03/22/2015   Procedure: ESOPHAGOGASTRODUODENOSCOPY (EGD) WITH PROPOFOL;  Surgeon: Hulen Luster, MD;  Location: [facility] ENDOSCOPY;  Service:  Gastroenterology;  Laterality: N/A;   KNEE ARTHROPLASTY Right 09/23/2017   Procedure: COMPUTER ASSISTED TOTAL KNEE ARTHROPLASTY;  Surgeon: Dereck Leep, MD;  Location: [facility] ORS;  Service: Orthopedics;  Laterality: Right;   KNEE ARTHROSCOPY Right 08/02/2015   Procedure: ARTHROSCOPY KNEE, PARTIAL MEDIAL MENISECTOMY, PLICA INCISION;  Surgeon: Hessie Knows, MD;  Location: [facility] ORS;  Service: Orthopedics;  Laterality: Right;   KNEE ARTHROSCOPY      There were no vitals filed for this visit.   Subjective Assessment - 01/01/21 1427     Subjective Did a lot of driving this weekend with grocery shopping. Paid for it last night. had L neck burning 9:30 to 10 pm at the shoulder blade area for about 10-15 minutes, then 5 minutes 2x. Ususally lasts for 4-5 minutes. This weekend was more activty than usual. Brought the groceries at night Sunday. Has steps in the garage.    Pertinent History Neck pain. Pain began several weeks ago sudden onset, no known method of injury. No imaging for her neck yet. 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 Currently doing chores such as taking trash out, take the groceries in, lifiting the hamper. Pt is R hand dominant. Used to have L hand pareshtesia. Currently  does not have the hand symptoms. Pt also states having L low back pain.    Patient Stated Goals Get rid of the burning in her neck.    Currently in Pain? Yes    Pain Score 5     Pain Orientation Left                                       PT Education - 01/01/21 1528     Education provided Yes    Education Details ther-ex    [employer]) Educated Patient    Methods Explanation;Demonstration;Tactile cues;Verbal cues    Comprehension Returned demonstration;Verbalized understanding             Objective   No latex allergies Blood pressure is controlled per pt. No falls within the past 6 months. Fear of falling does not limit her but she is more carefull   [employer] Access Code  [email or web address]     Manual therapy   Seated STM R cervical paraspinal muscle to decrease tension    Seated STM R upper trap and rhomboid to decrease tension   Seated STM L upper trap muscle to decrease tension    Decreased neck pain reported afterwards.     Therapeutic exercise    Standing shoulder extension red band   L 10x3  B knee joint pain from standing. Eases with sitting  Seated manually resisted L elbow extension with PT manual resistance   L 10x   Seated manually resisted L cervical lateral shift isometrics 10x2 with 5 second holds  Decreased pain in L neck and upper trap.  Seated gentle manually resisted cervical flexion 10x5 seconds for 3 sets  Decreased cervical paraspinal muscle tension palpated   Decreased symptoms reported   Seated scapular retraction 10x5 seconds for 2 sets   Seated manually resisted scapular depression isometrics  R 10x3 with 5 second holds L 10x3 with 5 second holds. Decreased B upper trap muscle tension palpated     Improved exercise technique, movement at target joints, use of target muscles after mod verbal, visual, tactile cues.     Response to treatment/Clinical impression Worked on decreasing cervical paraspinal and B upper trap muscle tension, improving scapular and anterior cervical muscle strength to decrease stress to neck. No neck pain reported by pt at after session. Pt will benefit from continued skilled physical therapy services to decrease pain, improve strength and function.        PT Short Term Goals - 12/27/20 1420       PT SHORT TERM GOAL #1   Title Patient will be independent with her initial HEP to improve ability to turn her head without pain.    Baseline Has been doing her HEPno questions. (12/27/2020)    Time 3    Period Weeks    Status Achieved    Target Date 11/15/20               PT Long Term Goals - 12/27/20 1422       PT LONG TERM GOAL #1   Title Pt will have a decrease in neck pain to 2/10  or less at worst to promote ability to turn her head more comfortably.    Baseline 9/10 neck pain at most for the past 3 months (11/14/2018); 6-7/10 at most for the past 7 days (12/27/2020)    Time 8    Period Weeks  Status New      PT LONG TERM GOAL #2   Title Pt will improve her neck FOTO by at least 10 points as a demonstration of improved function.    Baseline Neck FOTO 57 (11/13/2020); 42 (12/27/2020)    Time 8    Period Weeks    Status On-going    Target Date 01/10/21                   Plan - 01/01/21 1520     Clinical Impression Statement Worked on decreasing cervical paraspinal and B upper trap muscle tension, improving scapular and anterior cervical muscle strength to decrease stress to neck. No neck pain reported by pt at after session. Pt will benefit from continued skilled physical therapy services to decrease pain, improve strength and function.    Personal Factors and Comorbidities Age;Comorbidity 2;Fitness;Past/Current Experience;Other    Comorbidities Arthritis, HTN    Examination-Activity Limitations Other    Stability/Clinical Decision Making Stable/Uncomplicated    Rehab Potential Fair    Clinical Impairments Affecting Rehab Potential pain, insurance    PT Frequency 2x / week    PT Duration 8 weeks    PT Treatment/Interventions Therapeutic activities;Therapeutic exercise;Neuromuscular re-education;Manual techniques;Dry needling;Electrical Stimulation;Iontophoresis 4mg /ml Dexamethasone;Traction    PT Next Visit Plan scapular strengthening, posture, manual techniques, modalities PRN    PT Home Exercise Plan Scapular retraction holds    Consulted and Agree with Plan of Care Patient             Patient will benefit from skilled therapeutic intervention in order to improve the following deficits and impairments:  Pain, Postural dysfunction, Improper body mechanics  Visit Diagnosis: Cervicalgia  Radiculopathy, cervical region     Problem List Patient  Active Problem List   Diagnosis Date Noted   S/P total knee arthroplasty 09/23/2017   Diabetes mellitus type 2, uncomplicated ([place]) 15/11/6977   Hypertension 09/22/2017   Hypothyroidism, unspecified 09/22/2017   Vitamin D deficiency, unspecified 09/22/2017   Edema of both legs 03/03/2017   BMI 40.0-44.9, adult ([place]) 12/03/2015   Pure hypercholesterolemia 05/26/2014    Joneen Boers PT, DPT   01/01/2021, 3:29 PM  [facility] [address], [place], [address] Phone: [phone]   Fax:  [phone]  Name: Dana Morton MRN: [id number] Date of Birth: Nov 19, 1950

## 2021-01-03 ENCOUNTER — Ambulatory Visit: Payer: Medicare Other

## 2021-01-03 DIAGNOSIS — M5412 Radiculopathy, cervical region: Secondary | ICD-10-CM

## 2021-01-03 DIAGNOSIS — M542 Cervicalgia: Secondary | ICD-10-CM | POA: Diagnosis not present

## 2021-01-03 NOTE — Therapy (Signed)
[facility] [address], [place], [address] Phone: [phone]   Fax:  [phone]  Physical Therapy Treatment  Patient Details  Name: Dana Morton MRN: [id number] Date of Birth: 12-Sep-1950 Referring Provider (PT): Earnie Larsson, MD   Encounter Date: 01/03/2021   PT End of Session - 01/03/21 1419     Visit Number 12    Number of Visits 17    Date for PT Re-Evaluation 01/10/21    Authorization Type 2    Authorization Time Period 10    PT Start Time 1419    PT Stop Time 1506    PT Time Calculation (min) 47 min    Activity Tolerance Patient tolerated treatment well    Behavior During Therapy WFL for tasks assessed/performed             Past Medical History:  Diagnosis Date   Anemia    h/o    Arthritis    Claustrophobia    Diverticulosis    Fibrocystic breast disease (FCBD)    Fibrocystic disease of breast    GERD (gastroesophageal reflux disease)    Herpes simplex    HLD (hyperlipidemia)    Hypercholesteremia    Hyperlipidemia    Hypertension    Hypothyroidism    Pre-diabetes    Pulmonary nodules    Pure hypercholesterolemia    Rotator cuff tear    LEFT SHOULDER   Sleep apnea    no cpap   Vitamin D deficiency    Vitamin D deficiency     Past Surgical History:  Procedure Laterality Date   ABDOMINAL HYSTERECTOMY     BREAST BIOPSY Left 06/19/2020   stereo biopsy/ x clip/ path pending   BREAST EXCISIONAL BIOPSY Left 70/80s   benign   CHOLECYSTECTOMY     COLONOSCOPY WITH PROPOFOL N/A 03/22/2015   Procedure: COLONOSCOPY WITH PROPOFOL;  Surgeon: Hulen Luster, MD;  Location: [facility] ENDOSCOPY;  Service: Gastroenterology;  Laterality: N/A;   DG FOOT HEEL ([facility] HX)     DILATION AND CURETTAGE OF UTERUS     ESOPHAGOGASTRODUODENOSCOPY (EGD) WITH PROPOFOL N/A 03/22/2015   Procedure: ESOPHAGOGASTRODUODENOSCOPY (EGD) WITH PROPOFOL;  Surgeon: Hulen Luster, MD;  Location: [facility] ENDOSCOPY;  Service:  Gastroenterology;  Laterality: N/A;   KNEE ARTHROPLASTY Right 09/23/2017   Procedure: COMPUTER ASSISTED TOTAL KNEE ARTHROPLASTY;  Surgeon: Dereck Leep, MD;  Location: [facility] ORS;  Service: Orthopedics;  Laterality: Right;   KNEE ARTHROSCOPY Right 08/02/2015   Procedure: ARTHROSCOPY KNEE, PARTIAL MEDIAL MENISECTOMY, PLICA INCISION;  Surgeon: Hessie Knows, MD;  Location: [facility] ORS;  Service: Orthopedics;  Laterality: Right;   KNEE ARTHROSCOPY      There were no vitals filed for this visit.   Subjective Assessment - 01/03/21 1422     Subjective Did 10 sets of 10 exercises of the cervical flexion isometrics. Neck is sore. 6/10 R neck pain with R cervical rotation.    Pertinent History Neck pain. Pain began several weeks ago sudden onset, no known method of injury. No imaging for her neck yet. 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 Currently doing chores such as taking trash out, take the groceries in, lifiting the hamper. Pt is R hand dominant. Used to have L hand pareshtesia. Currently does not have the hand symptoms. Pt also states having L low back pain.    Patient Stated Goals Get rid of the burning in her neck.    Currently in Pain? Yes  Pain Score 6                                        PT Education - 01/03/21 1425     Education provided Yes    Education Details ther-ex    [employer]) Educated Patient    Methods Explanation;Demonstration;Tactile cues;Verbal cues    Comprehension Returned demonstration;Verbalized understanding             Objective   No latex allergies Blood pressure is controlled per pt. No falls within the past 6 months. Fear of falling does not limit her but she is more carefull   [employer] Access Code [email or web address]     Manual therapy   Seated STM R cervical paraspinal muscle to decrease tension    Seated STM R upper trap and rhomboid to decrease tension   Seated STM L upper trap muscle to decrease tension  Seated L  cervical rotation with gentle A to P pressure to R posterior neck around C5 TP 10x     Therapeutic exercise    Seated manually resisted L cervical lateral shift isometrics 10x2 with 5 second holds  Seated manually resisted scapular depression isometrics  R 10x with 5 second holds  Then with L cervical rotation 10x2  L 10x with 5 second holds.  Then with R cervical rotation 5x, then 10x  R cervical headache if she goes too far   Seated gentle manually resisted eccentric ER   To the R 10x3. R upper trap pulling sensation with R cervical rotation afterwards.   Then to the L 2x. Uncomfortable. Eases with rest.     Improved exercise technique, movement at target joints, use of target muscles after mod verbal, visual, tactile cues.     Response to treatment/Clinical impression Decreased neck pain with cervical rotation with decreasing posterior cervical paraspinal and upper trap muscle tension. Pt will benefit from continued skilled physical therapy services to decrease pain, improve strength and function.        PT Short Term Goals - 12/27/20 1420       PT SHORT TERM GOAL #1   Title Patient will be independent with her initial HEP to improve ability to turn her head without pain.    Baseline Has been doing her HEPno questions. (12/27/2020)    Time 3    Period Weeks    Status Achieved    Target Date 11/15/20               PT Long Term Goals - 12/27/20 1422       PT LONG TERM GOAL #1   Title Pt will have a decrease in neck pain to 2/10 or less at worst to promote ability to turn her head more comfortably.    Baseline 9/10 neck pain at most for the past 3 months (11/14/2018); 6-7/10 at most for the past 7 days (12/27/2020)    Time 8    Period Weeks    Status New      PT LONG TERM GOAL #2   Title Pt will improve her neck FOTO by at least 10 points as a demonstration of improved function.    Baseline Neck FOTO 57 (11/13/2020); 42 (12/27/2020)    Time 8    Period Weeks     Status On-going    Target Date 01/10/21  Plan - 01/03/21 1426     Clinical Impression Statement Decreased neck pain with cervical rotation with decreasing posterior cervical paraspinal and upper trap muscle tension. Pt will benefit from continued skilled physical therapy services to decrease pain, improve strength and function.    Personal Factors and Comorbidities Age;Comorbidity 2;Fitness;Past/Current Experience;Other    Comorbidities Arthritis, HTN    Examination-Activity Limitations Other    Stability/Clinical Decision Making Stable/Uncomplicated    Rehab Potential Fair    Clinical Impairments Affecting Rehab Potential pain, insurance    PT Frequency 2x / week    PT Duration 8 weeks    PT Treatment/Interventions Therapeutic activities;Therapeutic exercise;Neuromuscular re-education;Manual techniques;Dry needling;Electrical Stimulation;Iontophoresis 4mg /ml Dexamethasone;Traction    PT Next Visit Plan scapular strengthening, posture, manual techniques, modalities PRN    PT Home Exercise Plan Scapular retraction holds    Consulted and Agree with Plan of Care Patient             Patient will benefit from skilled therapeutic intervention in order to improve the following deficits and impairments:  Pain, Postural dysfunction, Improper body mechanics  Visit Diagnosis: Cervicalgia  Radiculopathy, cervical region     Problem List Patient Active Problem List   Diagnosis Date Noted   S/P total knee arthroplasty 09/23/2017   Diabetes mellitus type 2, uncomplicated ([place]) 54/65/0354   Hypertension 09/22/2017   Hypothyroidism, unspecified 09/22/2017   Vitamin D deficiency, unspecified 09/22/2017   Edema of both legs 03/03/2017   BMI 40.0-44.9, adult ([place]) 12/03/2015   Pure hypercholesterolemia 05/26/2014    Joneen Boers PT, DPT   01/03/2021, 3:20 PM  [place] [address] [place], [address] Phone: [phone]   Fax:  [phone]  Name: Dana Morton MRN: [id number] Date of Birth: July 14, 1951

## 2021-01-08 ENCOUNTER — Ambulatory Visit: Payer: Medicare Other

## 2021-01-08 DIAGNOSIS — M542 Cervicalgia: Secondary | ICD-10-CM | POA: Diagnosis not present

## 2021-01-08 DIAGNOSIS — M5412 Radiculopathy, cervical region: Secondary | ICD-10-CM

## 2021-01-08 NOTE — Therapy (Signed)
[facility] [address], [place], [address] Phone: [phone]   Fax:  [phone]  Physical Therapy Treatment  Patient Details  Name: Dana Morton MRN: [id number] Date of Birth: 03-02-1951 Referring Provider (PT): Earnie Larsson, MD   Encounter Date: 01/08/2021   PT End of Session - 01/08/21 1418     Visit Number 13    Number of Visits 25    Date for PT Re-Evaluation 02/07/21    Authorization Type 3    Authorization Time Period 10    PT Start Time 1418    PT Stop Time 1458    PT Time Calculation (min) 40 min    Activity Tolerance Patient tolerated treatment well    Behavior During Therapy WFL for tasks assessed/performed             Past Medical History:  Diagnosis Date   Anemia    h/o    Arthritis    Claustrophobia    Diverticulosis    Fibrocystic breast disease (FCBD)    Fibrocystic disease of breast    GERD (gastroesophageal reflux disease)    Herpes simplex    HLD (hyperlipidemia)    Hypercholesteremia    Hyperlipidemia    Hypertension    Hypothyroidism    Pre-diabetes    Pulmonary nodules    Pure hypercholesterolemia    Rotator cuff tear    LEFT SHOULDER   Sleep apnea    no cpap   Vitamin D deficiency    Vitamin D deficiency     Past Surgical History:  Procedure Laterality Date   ABDOMINAL HYSTERECTOMY     BREAST BIOPSY Left 06/19/2020   stereo biopsy/ x clip/ path pending   BREAST EXCISIONAL BIOPSY Left 70/80s   benign   CHOLECYSTECTOMY     COLONOSCOPY WITH PROPOFOL N/A 03/22/2015   Procedure: COLONOSCOPY WITH PROPOFOL;  Surgeon: Hulen Luster, MD;  Location: [facility] ENDOSCOPY;  Service: Gastroenterology;  Laterality: N/A;   DG FOOT HEEL ([facility] HX)     DILATION AND CURETTAGE OF UTERUS     ESOPHAGOGASTRODUODENOSCOPY (EGD) WITH PROPOFOL N/A 03/22/2015   Procedure: ESOPHAGOGASTRODUODENOSCOPY (EGD) WITH PROPOFOL;  Surgeon: Hulen Luster, MD;  Location: [facility] ENDOSCOPY;  Service:  Gastroenterology;  Laterality: N/A;   KNEE ARTHROPLASTY Right 09/23/2017   Procedure: COMPUTER ASSISTED TOTAL KNEE ARTHROPLASTY;  Surgeon: Dereck Leep, MD;  Location: [facility] ORS;  Service: Orthopedics;  Laterality: Right;   KNEE ARTHROSCOPY Right 08/02/2015   Procedure: ARTHROSCOPY KNEE, PARTIAL MEDIAL MENISECTOMY, PLICA INCISION;  Surgeon: Hessie Knows, MD;  Location: [facility] ORS;  Service: Orthopedics;  Laterality: Right;   KNEE ARTHROSCOPY      There were no vitals filed for this visit.   Subjective Assessment - 01/08/21 1420     Subjective Might have hurt her neck. Had to help her daughter move a 75-80 lbs box yesterday. L upper trap and medial shoulder blade bothered her last night. Stopped doing the cervical flexion isometrics at home because she does not know if it was making a difference. 6/10 currently. 8/10 at most for the past 7 days. Comes in spurts. PT is helping because she has felt better after session. Better able to make her bed and reach and use her cordless vaccuum cleaner since starting PT. Has to leave at 3 pm today.    Pertinent History Neck pain. Pain began several weeks ago sudden onset, no known method of injury. No imaging for her neck  yet. 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 Currently doing chores such as taking trash out, take the groceries in, lifiting the hamper. Pt is R hand dominant. Used to have L hand pareshtesia. Currently does not have the hand symptoms. Pt also states having L low back pain.    Patient Stated Goals Get rid of the burning in her neck.    Currently in Pain? Yes    Pain Score 6                                        PT Education - 01/08/21 1440     Education provided Yes    Education Details ther-ex    [employer]) Educated Patient    Methods Explanation;Demonstration;Tactile cues;Verbal cues    Comprehension Returned demonstration;Verbalized understanding              Objective   No latex allergies Blood  pressure is controlled per pt. No falls within the past 6 months. Fear of falling does not limit her but she is more carefull   [employer] Access Code [email or web address]     Manual therapy    Seated STM R cervical paraspinal muscle to decrease tension    Seated STM R upper trap and rhomboid to decrease tension   Seated STM L first rib area/scalene to decrease tension      Therapeutic exercise    Seated manually resisted L cervical lateral shift isometrics 10x3 with 5 second holds   Seated R cervical side bend 10x5 seconds for 3 sets   Decreased neck pain with R  cervical rotation  Seated B shoulder flexion with B scapular retraction 10x  R gastroc cramp initiation sensation  Then with slight trunk flexion 10x2   Decreased R gastroc cramp initiation sensation  Seated B ankle DF 10x5 seconds   Seated manually resisted scapular retraction isometrics, targeting lower trap muscles   L 10x5 seconds      Improved exercise technique, movement at target joints, use of target muscles after mod verbal, visual, tactile cues.     Response to treatment/Clinical impression Continued working on improving scapular strength and decreasing cervical paraspinal and bilateral upper trap muscle tension to promote ability to turn her head more comfortably. No neck pain with cervical rotation and at rest after session.  Pt also better able to make her bed, reach, and use her cordless vacuum cleaner since starting PT based on subjective reports. Pain level however varies but demonstrates at least 1 level lower at worst since initial evaluation. Pt will benefit from continued skilled physical therapy services to decrease pain, improve strength and function.            PT Short Term Goals - 12/27/20 1420       PT SHORT TERM GOAL #1   Title Patient will be independent with her initial HEP to improve ability to turn her head without pain.    Baseline Has been doing her HEPno questions. (12/27/2020)    Time  3    Period Weeks    Status Achieved    Target Date 11/15/20               PT Long Term Goals - 01/08/21 1441       PT LONG TERM GOAL #1   Title Pt will have a decrease in neck pain to 2/10 or less at worst to promote ability  to turn her head more comfortably.    Baseline 9/10 neck pain at most for the past 3 months (11/14/2018); 6-7/10 at most for the past 7 days (12/27/2020); 8/10 (pt helped lift a 75-80 lb box yesterday) 01/08/2021    Time 4    Period Weeks    Status On-going    Target Date 02/07/21      PT LONG TERM GOAL #2   Title Pt will improve her neck FOTO by at least 10 points as a demonstration of improved function.    Baseline Neck FOTO 57 (11/13/2020); 42 (12/27/2020)    Time 4    Period Weeks    Status On-going    Target Date 02/07/21                   Plan - 01/08/21 1440     Clinical Impression Statement Continued working on improving scapular strength and decreasing cervical paraspinal and bilateral upper trap muscle tension to promote ability to turn her head more comfortably. No neck pain with cervical rotation and at rest after session.  Pt also better able to make her bed, reach, and use her cordless vacuum cleaner since starting PT based on subjective reports. Pain level however varies but demonstrates at least 1 level lower at worst since initial evaluation. Pt will benefit from continued skilled physical therapy services to decrease pain, improve strength and function.    Personal Factors and Comorbidities Age;Comorbidity 2;Fitness;Past/Current Experience;Other    Comorbidities Arthritis, HTN    Examination-Activity Limitations Other    Stability/Clinical Decision Making Stable/Uncomplicated    Rehab Potential Fair    Clinical Impairments Affecting Rehab Potential pain, insurance    PT Frequency 2x / week    PT Duration 8 weeks    PT Treatment/Interventions Therapeutic activities;Therapeutic exercise;Neuromuscular re-education;Manual  techniques;Dry needling;Electrical Stimulation;Iontophoresis 4mg /ml Dexamethasone;Traction    PT Next Visit Plan scapular strengthening, posture, manual techniques, modalities PRN    PT Home Exercise Plan Scapular retraction holds    Consulted and Agree with Plan of Care Patient             Patient will benefit from skilled therapeutic intervention in order to improve the following deficits and impairments:  Pain, Postural dysfunction, Improper body mechanics  Visit Diagnosis: Cervicalgia - Plan: PT plan of care cert/re-cert  Radiculopathy, cervical region - Plan: PT plan of care cert/re-cert     Problem List Patient Active Problem List   Diagnosis Date Noted   S/P total knee arthroplasty 09/23/2017   Diabetes mellitus type 2, uncomplicated ([place]) 98/92/1194   Hypertension 09/22/2017   Hypothyroidism, unspecified 09/22/2017   Vitamin D deficiency, unspecified 09/22/2017   Edema of both legs 03/03/2017   BMI 40.0-44.9, adult ([place]) 12/03/2015   Pure hypercholesterolemia 05/26/2014     Joneen Boers PT, DPT   01/08/2021, 3:23 PM  [facility] [address] [place], [address] Phone: [phone]   Fax:  [phone]  Name: Dana Morton MRN: [id number] Date of Birth: Sep 30, 1950

## 2021-01-10 ENCOUNTER — Other Ambulatory Visit: Payer: Self-pay

## 2021-01-10 ENCOUNTER — Ambulatory Visit: Payer: Medicare Other

## 2021-01-10 DIAGNOSIS — M5412 Radiculopathy, cervical region: Secondary | ICD-10-CM

## 2021-01-10 DIAGNOSIS — M542 Cervicalgia: Secondary | ICD-10-CM | POA: Diagnosis not present

## 2021-01-10 NOTE — Therapy (Signed)
[facility] [address], [place], [address] Phone: [phone]   Fax:  [phone]  Physical Therapy Treatment  Patient Details  Name: Dana Morton MRN: [id number] Date of Birth: [birth year] Referring Provider (PT): Earnie Larsson, MD   Encounter Date: 01/10/2021   PT End of Session - 01/10/21 1420     Visit Number 14    Number of Visits 25    Date for PT Re-Evaluation 02/07/21    Authorization Type 4    Authorization Time Period 10    PT Start Time 1420    PT Stop Time 1513    PT Time Calculation (min) 53 min    Activity Tolerance Patient tolerated treatment well    Behavior During Therapy WFL for tasks assessed/performed             Past Medical History:  Diagnosis Date   Anemia    h/o    Arthritis    Claustrophobia    Diverticulosis    Fibrocystic breast disease (FCBD)    Fibrocystic disease of breast    GERD (gastroesophageal reflux disease)    Herpes simplex    HLD (hyperlipidemia)    Hypercholesteremia    Hyperlipidemia    Hypertension    Hypothyroidism    Pre-diabetes    Pulmonary nodules    Pure hypercholesterolemia    Rotator cuff tear    LEFT SHOULDER   Sleep apnea    no cpap   Vitamin D deficiency    Vitamin D deficiency     Past Surgical History:  Procedure Laterality Date   ABDOMINAL HYSTERECTOMY     BREAST BIOPSY Left 06/19/2020   stereo biopsy/ x clip/ path pending   BREAST EXCISIONAL BIOPSY Left 70/80s   benign   CHOLECYSTECTOMY     COLONOSCOPY WITH PROPOFOL N/A 03/22/2015   Procedure: COLONOSCOPY WITH PROPOFOL;  Surgeon: Hulen Luster, MD;  Location: [facility] ENDOSCOPY;  Service: Gastroenterology;  Laterality: N/A;   DG FOOT HEEL ([facility] HX)     DILATION AND CURETTAGE OF UTERUS     ESOPHAGOGASTRODUODENOSCOPY (EGD) WITH PROPOFOL N/A 03/22/2015   Procedure: ESOPHAGOGASTRODUODENOSCOPY (EGD) WITH PROPOFOL;  Surgeon: Hulen Luster, MD;  Location: [facility] ENDOSCOPY;  Service:  Gastroenterology;  Laterality: N/A;   KNEE ARTHROPLASTY Right 09/23/2017   Procedure: COMPUTER ASSISTED TOTAL KNEE ARTHROPLASTY;  Surgeon: Dereck Leep, MD;  Location: [facility] ORS;  Service: Orthopedics;  Laterality: Right;   KNEE ARTHROSCOPY Right 08/02/2015   Procedure: ARTHROSCOPY KNEE, PARTIAL MEDIAL MENISECTOMY, PLICA INCISION;  Surgeon: Hessie Knows, MD;  Location: [facility] ORS;  Service: Orthopedics;  Laterality: Right;   KNEE ARTHROSCOPY      There were no vitals filed for this visit.   Subjective Assessment - 01/10/21 1421     Subjective Neck has been a little bit tender from side bending to the L exercises.    Pertinent History Neck pain. Pain began several weeks ago sudden onset, no known method of injury. No imaging for her neck yet. 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 Currently doing chores such as taking trash out, take the groceries in, lifiting the hamper. Pt is R hand dominant. Used to have L hand pareshtesia. Currently does not have the hand symptoms. Pt also states having L low back pain.    Patient Stated Goals Get rid of the burning in her neck.    Currently in Pain? Yes    Pain Score 6  PT Education - 01/10/21 1432     Education provided Yes    Education Details ther-ex    [employer]) Educated Patient    Methods Explanation;Demonstration;Tactile cues;Verbal cues    Comprehension Returned demonstration;Verbalized understanding            Objective   No latex allergies Blood pressure is controlled per pt. No falls within the past 6 months. Fear of falling does not limit her but she is more carefull   [employer] Access Code [email or web address]     Manual therapy   Seated STM R cervical paraspinal muscle around C4 to decrease tension    Decreased discomfort with R cervical rotation afterwards    Seated STM L first rib area/scalene to decrease tension     Therapeutic exercise    Seated B scapular  retraction red band 10x3 with 5 second holds   Scapular depression isometrics sitting  R 10x5 seconds for 3 sets.  10x5 seconds with PT manual resistance. Decreased R neck pain. Reviewed and given as part pf HEP. Handout provided.,    L 10x5 seconds for 2 sets  Seated cervical nodding 10x3  R lateral neck pain along SCM under R ear  Seated chin tucks 10x2  B anterior knee discomfort afterward. Increased R scalene muscle discomfort  Seated cervical flexion 10x5 seconds for 2 sets   Seated gentle PT manually resisted cervical flexion isometrics in neutral, cues to prevent cervical protraction 10x5 seconds   No R neck pain with cervical rotation afterwards.       Improved exercise technique, movement at target joints, use of target muscles after mod verbal, visual, tactile cues.     Response to treatment/Clinical impression Decreased neck pain with R and L cervical rotation with activation of anterior cervical muscle activation with cues to prevent cervical protraction compensation. Decreased neck pain with cervical rotation with decreasing cervical paraspinal muscle tension as well. Pt will benefit from continued skilled physical therapy services to decrease pain, improve strength and function.        PT Short Term Goals - 12/27/20 1420       PT SHORT TERM GOAL #1   Title Patient will be independent with her initial HEP to improve ability to turn her head without pain.    Baseline Has been doing her HEPno questions. (12/27/2020)    Time 3    Period Weeks    Status Achieved    Target Date 11/15/20               PT Long Term Goals - 01/08/21 1441       PT LONG TERM GOAL #1   Title Pt will have a decrease in neck pain to 2/10 or less at worst to promote ability to turn her head more comfortably.    Baseline 9/10 neck pain at most for the past 3 months (11/14/2018); 6-7/10 at most for the past 7 days (12/27/2020); 8/10 (pt helped lift a 75-80 lb box yesterday) 01/08/2021     Time 4    Period Weeks    Status On-going    Target Date 02/07/21      PT LONG TERM GOAL #2   Title Pt will improve her neck FOTO by at least 10 points as a demonstration of improved function.    Baseline Neck FOTO 57 (11/13/2020); 42 (12/27/2020)    Time 4    Period Weeks    Status On-going    Target Date 02/07/21  Plan - 01/10/21 1432     Clinical Impression Statement Decreased neck pain with R and L cervical rotation with activation of anterior cervical muscle activation with cues to prevent cervical protraction compensation. Decreased neck pain with cervical rotation with decreasing cervical paraspinal muscle tension as well. Pt will benefit from continued skilled physical therapy services to decrease pain, improve strength and function.    Personal Factors and Comorbidities Age;Comorbidity 2;Fitness;Past/Current Experience;Other    Comorbidities Arthritis, HTN    Examination-Activity Limitations Other    Stability/Clinical Decision Making Stable/Uncomplicated    Rehab Potential Fair    Clinical Impairments Affecting Rehab Potential pain, insurance    PT Frequency 2x / week    PT Duration 8 weeks    PT Treatment/Interventions Therapeutic activities;Therapeutic exercise;Neuromuscular re-education;Manual techniques;Dry needling;Electrical Stimulation;Iontophoresis 4mg /ml Dexamethasone;Traction    PT Next Visit Plan scapular strengthening, posture, manual techniques, modalities PRN    PT Home Exercise Plan Scapular retraction holds    Consulted and Agree with Plan of Care Patient             Patient will benefit from skilled therapeutic intervention in order to improve the following deficits and impairments:  Pain, Postural dysfunction, Improper body mechanics  Visit Diagnosis: Cervicalgia  Radiculopathy, cervical region     Problem List Patient Active Problem List   Diagnosis Date Noted   S/P total knee arthroplasty 09/23/2017   Diabetes  mellitus type 2, uncomplicated ([place]) 97/41/6384   Hypertension 09/22/2017   Hypothyroidism, unspecified 09/22/2017   Vitamin D deficiency, unspecified 09/22/2017   Edema of both legs 03/03/2017   BMI 40.0-44.9, adult ([place]) 12/03/2015   Pure hypercholesterolemia 05/26/2014    Joneen Boers PT, DPT   01/10/2021, 3:20 PM  [facility] [address] [place], [address] Phone: [phone]   Fax:  [phone]  Name: Dana Morton MRN: [id number] Date of Birth: 12/21/1950

## 2021-01-15 ENCOUNTER — Ambulatory Visit: Payer: Medicare Other | Attending: Neurosurgery

## 2021-01-15 DIAGNOSIS — M542 Cervicalgia: Secondary | ICD-10-CM | POA: Insufficient documentation

## 2021-01-15 DIAGNOSIS — M5412 Radiculopathy, cervical region: Secondary | ICD-10-CM | POA: Insufficient documentation

## 2021-01-15 NOTE — Therapy (Signed)
[facility] [address], [place], [address] Phone: [phone]   Fax:  [phone]  Physical Therapy Treatment  Patient Details  Name: Dana Morton MRN: [id number] Date of Birth: [birth year] Referring Provider (PT): Earnie Larsson, MD   Encounter Date: 01/15/2021   PT End of Session - 01/15/21 1504     Visit Number 15    Number of Visits 25    Date for PT Re-Evaluation 02/07/21    Authorization Type 5    Authorization Time Period 10    PT Start Time 1504    PT Stop Time 6294    PT Time Calculation (min) 41 min    Activity Tolerance Patient tolerated treatment well    Behavior During Therapy WFL for tasks assessed/performed             Past Medical History:  Diagnosis Date   Anemia    h/o    Arthritis    Claustrophobia    Diverticulosis    Fibrocystic breast disease (FCBD)    Fibrocystic disease of breast    GERD (gastroesophageal reflux disease)    Herpes simplex    HLD (hyperlipidemia)    Hypercholesteremia    Hyperlipidemia    Hypertension    Hypothyroidism    Pre-diabetes    Pulmonary nodules    Pure hypercholesterolemia    Rotator cuff tear    LEFT SHOULDER   Sleep apnea    no cpap   Vitamin D deficiency    Vitamin D deficiency     Past Surgical History:  Procedure Laterality Date   ABDOMINAL HYSTERECTOMY     BREAST BIOPSY Left 06/19/2020   stereo biopsy/ x clip/ path pending   BREAST EXCISIONAL BIOPSY Left 70/80s   benign   CHOLECYSTECTOMY     COLONOSCOPY WITH PROPOFOL N/A 03/22/2015   Procedure: COLONOSCOPY WITH PROPOFOL;  Surgeon: Hulen Luster, MD;  Location: [facility] ENDOSCOPY;  Service: Gastroenterology;  Laterality: N/A;   DG FOOT HEEL ([facility] HX)     DILATION AND CURETTAGE OF UTERUS     ESOPHAGOGASTRODUODENOSCOPY (EGD) WITH PROPOFOL N/A 03/22/2015   Procedure: ESOPHAGOGASTRODUODENOSCOPY (EGD) WITH PROPOFOL;  Surgeon: Hulen Luster, MD;  Location: [facility] ENDOSCOPY;  Service:  Gastroenterology;  Laterality: N/A;   KNEE ARTHROPLASTY Right 09/23/2017   Procedure: COMPUTER ASSISTED TOTAL KNEE ARTHROPLASTY;  Surgeon: Dereck Leep, MD;  Location: [facility] ORS;  Service: Orthopedics;  Laterality: Right;   KNEE ARTHROSCOPY Right 08/02/2015   Procedure: ARTHROSCOPY KNEE, PARTIAL MEDIAL MENISECTOMY, PLICA INCISION;  Surgeon: Hessie Knows, MD;  Location: [facility] ORS;  Service: Orthopedics;  Laterality: Right;   KNEE ARTHROSCOPY      There were no vitals filed for this visit.   Subjective Assessment - 01/15/21 1506     Subjective Back is bothering her (7/10). Has not really had any issues with her neck. No pain currently with turning her head.    Pertinent History Neck pain. Pain began several weeks ago sudden onset, no known method of injury. No imaging for her neck yet. 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 Currently doing chores such as taking trash out, take the groceries in, lifiting the hamper. Pt is R hand dominant. Used to have L hand pareshtesia. Currently does not have the hand symptoms. Pt also states having L low back pain.    Patient Stated Goals Get rid of the burning in her neck.    Currently in Pain? Yes  Pain Score 7     Pain Location Back                                       PT Education - 01/15/21 1521     Education provided Yes    Education Details ther-ex    Person(s) Educated Patient    Methods Explanation;Demonstration;Tactile cues;Verbal cues    Comprehension Returned demonstration;Verbalized understanding            Objective   No latex allergies Blood pressure is controlled per pt. No falls within the past 6 months. Fear of falling does not limit her but she is more carefull   [employer] Access Code [email or web address]     Manual therapy     Seated STM R cervical paraspinal muscle around C4 to decrease tension        Seated STM L first rib area/scalene to decrease tension     Therapeutic exercise    Seated  gentle PT manually resisted cervical flexion isometrics in neutral, cues to prevent cervical protraction 10x5 seconds for 3 sets         Seated B scapular retraction red band 10x3 with 5 second holds   Scapular depression isometrics sitting            R 10x5 seconds for 3 sets with PT manual resistance.   L 10x5 seconds for 3 sets with PT manual resistance  Seated cervical flexion 10x5 seconds for 3 sets           Improved exercise technique, movement at target joints, use of target muscles after mod verbal, visual, tactile cues.     Response to treatment/Clinical impression Improving overall neck pain with no reports of cervical pain at rest and with cervical rotation at start of session. Continued working on improving anterior cervical, as well as scapular strength to promote better mechanics at her neck and decrease pain. Pt tolerated session well without aggravation of symptoms. Pt will benefit from continued skilled physical therapy services to decrease pain, improve strength and function.       PT Short Term Goals - 12/27/20 1420       PT SHORT TERM GOAL #1   Title Patient will be independent with her initial HEP to improve ability to turn her head without pain.    Baseline Has been doing her HEPno questions. (12/27/2020)    Time 3    Period Weeks    Status Achieved    Target Date 11/15/20               PT Long Term Goals - 01/08/21 1441       PT LONG TERM GOAL #1   Title Pt will have a decrease in neck pain to 2/10 or less at worst to promote ability to turn her head more comfortably.    Baseline 9/10 neck pain at most for the past 3 months (11/14/2018); 6-7/10 at most for the past 7 days (12/27/2020); 8/10 (pt helped lift a 75-80 lb box yesterday) 01/08/2021    Time 4    Period Weeks    Status On-going    Target Date 02/07/21      PT LONG TERM GOAL #2   Title Pt will improve her neck FOTO by at least 10 points as a demonstration of improved function.     Baseline Neck FOTO 57 (11/13/2020); 42 (12/27/2020)  Time 4    Period Weeks    Status On-going    Target Date 02/07/21                   Plan - 01/15/21 1535     Clinical Impression Statement Improving overall neck pain with no reports of cervical pain at rest and with cervical rotation at start of session. Continued working on improving anterior cervical, as well as scapular strength to promote better mechanics at her neck and decrease pain. Pt tolerated session well without aggravation of symptoms. Pt will benefit from continued skilled physical therapy services to decrease pain, improve strength and function.    Personal Factors and Comorbidities Age;Comorbidity 2;Fitness;Past/Current Experience;Other    Comorbidities Arthritis, HTN    Examination-Activity Limitations Other    Stability/Clinical Decision Making Stable/Uncomplicated    Rehab Potential Fair    Clinical Impairments Affecting Rehab Potential pain, insurance    PT Frequency 2x / week    PT Duration 8 weeks    PT Treatment/Interventions Therapeutic activities;Therapeutic exercise;Neuromuscular re-education;Manual techniques;Dry needling;Electrical Stimulation;Iontophoresis 4mg /ml Dexamethasone;Traction    PT Next Visit Plan scapular strengthening, posture, manual techniques, modalities PRN    PT Home Exercise Plan Scapular retraction holds    Consulted and Agree with Plan of Care Patient             Patient will benefit from skilled therapeutic intervention in order to improve the following deficits and impairments:  Pain, Postural dysfunction, Improper body mechanics  Visit Diagnosis: No diagnosis found.     Problem List Patient Active Problem List   Diagnosis Date Noted   S/P total knee arthroplasty 09/23/2017   Diabetes mellitus type 2, uncomplicated ([place]) 16/04/9603   Hypertension 09/22/2017   Hypothyroidism, unspecified 09/22/2017   Vitamin D deficiency, unspecified 09/22/2017   Edema of both  legs 03/03/2017   BMI 40.0-44.9, adult ([place]) 12/03/2015   Pure hypercholesterolemia 05/26/2014    Joneen Boers PT, DPT   01/15/2021, 5:00 PM  [place] [address], [place], [address] Phone: [phone]   Fax:  [phone]  Name: Dana Morton MRN: [id number] Date of Birth: 12-13-1950

## 2021-01-21 ENCOUNTER — Ambulatory Visit: Payer: Medicare Other

## 2021-01-21 DIAGNOSIS — M5412 Radiculopathy, cervical region: Secondary | ICD-10-CM

## 2021-01-21 DIAGNOSIS — M542 Cervicalgia: Secondary | ICD-10-CM

## 2021-01-21 NOTE — Therapy (Signed)
[facility] [address], [place], [address] Phone: [phone]   Fax:  [phone]  Physical Therapy Treatment  Patient Details  Name: Dana Morton MRN: [id number] Date of Birth: [birth year] Referring Provider (PT): Earnie Larsson, MD   Encounter Date: 01/21/2021   PT End of Session - 01/21/21 1547     Visit Number 16    Number of Visits 25    Date for PT Re-Evaluation 02/07/21    Authorization Type 6    Authorization Time Period 10    PT Start Time 1548    PT Stop Time 1634    PT Time Calculation (min) 46 min    Activity Tolerance Patient tolerated treatment well    Behavior During Therapy WFL for tasks assessed/performed             Past Medical History:  Diagnosis Date   Anemia    h/o    Arthritis    Claustrophobia    Diverticulosis    Fibrocystic breast disease (FCBD)    Fibrocystic disease of breast    GERD (gastroesophageal reflux disease)    Herpes simplex    HLD (hyperlipidemia)    Hypercholesteremia    Hyperlipidemia    Hypertension    Hypothyroidism    Pre-diabetes    Pulmonary nodules    Pure hypercholesterolemia    Rotator cuff tear    LEFT SHOULDER   Sleep apnea    no cpap   Vitamin D deficiency    Vitamin D deficiency     Past Surgical History:  Procedure Laterality Date   ABDOMINAL HYSTERECTOMY     BREAST BIOPSY Left 06/19/2020   stereo biopsy/ x clip/ path pending   BREAST EXCISIONAL BIOPSY Left 70/80s   benign   CHOLECYSTECTOMY     COLONOSCOPY WITH PROPOFOL N/A 03/22/2015   Procedure: COLONOSCOPY WITH PROPOFOL;  Surgeon: Hulen Luster, MD;  Location: [facility] ENDOSCOPY;  Service: Gastroenterology;  Laterality: N/A;   DG FOOT HEEL ([facility] HX)     DILATION AND CURETTAGE OF UTERUS     ESOPHAGOGASTRODUODENOSCOPY (EGD) WITH PROPOFOL N/A 03/22/2015   Procedure: ESOPHAGOGASTRODUODENOSCOPY (EGD) WITH PROPOFOL;  Surgeon: Hulen Luster, MD;  Location: [facility] ENDOSCOPY;  Service:  Gastroenterology;  Laterality: N/A;   KNEE ARTHROPLASTY Right 09/23/2017   Procedure: COMPUTER ASSISTED TOTAL KNEE ARTHROPLASTY;  Surgeon: Dereck Leep, MD;  Location: [facility] ORS;  Service: Orthopedics;  Laterality: Right;   KNEE ARTHROSCOPY Right 08/02/2015   Procedure: ARTHROSCOPY KNEE, PARTIAL MEDIAL MENISECTOMY, PLICA INCISION;  Surgeon: Hessie Knows, MD;  Location: [facility] ORS;  Service: Orthopedics;  Laterality: Right;   KNEE ARTHROSCOPY      There were no vitals filed for this visit.   Subjective Assessment - 01/21/21 1550     Subjective Did exercises Thursday and Friday with the elbows pushing down and chin tucks. Neck bothered her a lot for 2 days and had a posterior headache. 7-8/10 currently.    Pertinent History Neck pain. Pain began several weeks ago sudden onset, no known method of injury. No imaging for her neck yet. 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 Currently doing chores such as taking trash out, take the groceries in, lifiting the hamper. Pt is R hand dominant. Used to have L hand pareshtesia. Currently does not have the hand symptoms. Pt also states having L low back pain.    Patient Stated Goals Get rid of the burning in her neck.  Currently in Pain? Yes    Pain Score 8                                        PT Education - 01/21/21 1823     Education provided Yes    Education Details ther-ex    [employer]) Educated Patient    Methods Explanation;Demonstration;Tactile cues;Verbal cues    Comprehension Returned demonstration;Verbalized understanding              Objective   No latex allergies Blood pressure is controlled per pt. No falls within the past 6 months. Fear of falling does not limit her but she is more carefull   [employer] Access Code [email or web address]     Manual therapy   Seated STM R cervical paraspinal muscle around C4 to decrease tension        Seated STM L and R first rib area/scalene to decrease tension    STM R  and L posterior lateral neck to decrease fascial restrictions   And with R and L cervical rotatoin   Improved comfort level with cervical rotation afterwards  Seated cervical side bend with PT manual caudal pressure to first rib area  R 10x5 seconds for 2 sets. Discomfort with R cervical rotation        Therapeutic exercise    Pt was recommended to stop the chin tuck and scapular depression home exercise. Pt verbalized understanding.   Seated manually resisted scapular retraction targeting the lower trap muscle  R 10x3 with 5 second holds  L 10x5 seconds for 2 sets. R anterior pectoralis TTP afterwards.        Improved exercise technique, movement at target joints, use of target muscles after mod verbal, visual, tactile cues.    Response to treatment/Clinical impression  Fair tolerance to today's session. Difficulty decreasing symptoms with decreasing muscle tension around neck today. Some decrease in symptoms with cervical rotation with treatment to decrease fascial restrictions R posterior lateral neck. Pt will benefit from continued skilled physical therapy services to decrease pain, improve strength and function.       PT Short Term Goals - 12/27/20 1420       PT SHORT TERM GOAL #1   Title Patient will be independent with her initial HEP to improve ability to turn her head without pain.    Baseline Has been doing her HEPno questions. (12/27/2020)    Time 3    Period Weeks    Status Achieved    Target Date 11/15/20               PT Long Term Goals - 01/08/21 1441       PT LONG TERM GOAL #1   Title Pt will have a decrease in neck pain to 2/10 or less at worst to promote ability to turn her head more comfortably.    Baseline 9/10 neck pain at most for the past 3 months (11/14/2018); 6-7/10 at most for the past 7 days (12/27/2020); 8/10 (pt helped lift a 75-80 lb box yesterday) 01/08/2021    Time 4    Period Weeks    Status On-going    Target Date 02/07/21      PT  LONG TERM GOAL #2   Title Pt will improve her neck FOTO by at least 10 points as a demonstration of improved function.    Baseline Neck FOTO 57 (11/13/2020);  42 (12/27/2020)    Time 4    Period Weeks    Status On-going    Target Date 02/07/21                   Plan - 01/21/21 1824     Clinical Impression Statement Fair tolerance to today's session. Difficulty decreasing symptoms with decreasing muscle tension around neck today. Some decrease in symptoms with cervical rotation with treatment to decrease fascial restrictions R posterior lateral neck. Pt will benefit from continued skilled physical therapy services to decrease pain, improve strength and function.    Personal Factors and Comorbidities Age;Comorbidity 2;Fitness;Past/Current Experience;Other    Comorbidities Arthritis, HTN    Examination-Activity Limitations Other    Stability/Clinical Decision Making Stable/Uncomplicated    Rehab Potential Fair    Clinical Impairments Affecting Rehab Potential pain, insurance    PT Frequency 2x / week    PT Duration 8 weeks    PT Treatment/Interventions Therapeutic activities;Therapeutic exercise;Neuromuscular re-education;Manual techniques;Dry needling;Electrical Stimulation;Iontophoresis 4mg /ml Dexamethasone;Traction    PT Next Visit Plan scapular strengthening, posture, manual techniques, modalities PRN    PT Home Exercise Plan Scapular retraction holds    Consulted and Agree with Plan of Care Patient             Patient will benefit from skilled therapeutic intervention in order to improve the following deficits and impairments:  Pain, Postural dysfunction, Improper body mechanics  Visit Diagnosis: Cervicalgia  Radiculopathy, cervical region     Problem List Patient Active Problem List   Diagnosis Date Noted   S/P total knee arthroplasty 09/23/2017   Diabetes mellitus type 2, uncomplicated ([place]) 42/35/3614   Hypertension 09/22/2017   Hypothyroidism, unspecified  09/22/2017   Vitamin D deficiency, unspecified 09/22/2017   Edema of both legs 03/03/2017   BMI 40.0-44.9, adult ([place]) 12/03/2015   Pure hypercholesterolemia 05/26/2014   Joneen Boers PT, DPT   01/21/2021, 6:29 PM  [facility] [address] [place], [address] Phone: [phone]   Fax:  [phone]  Name: Dana Morton MRN: [id number] Date of Birth: 1950/12/23

## 2021-01-23 ENCOUNTER — Ambulatory Visit: Payer: Medicare Other

## 2021-01-23 DIAGNOSIS — M542 Cervicalgia: Secondary | ICD-10-CM | POA: Diagnosis not present

## 2021-01-23 DIAGNOSIS — M5412 Radiculopathy, cervical region: Secondary | ICD-10-CM

## 2021-01-23 NOTE — Therapy (Signed)
[place] [facility] [address], [place], [address] Phone: [phone]   Fax:  [phone]  Physical Therapy Treatment  Patient Details  Name: Dana Morton MRN: [id number] Date of Birth: 02/22/1951 Referring Provider (PT): Earnie Larsson, MD   Encounter Date: 01/23/2021   PT End of Session - 01/23/21 1547     Visit Number 17    Number of Visits 25    Date for PT Re-Evaluation 02/07/21    Authorization Type 7    Authorization Time Period 10    PT Start Time 1547    PT Stop Time 1631    PT Time Calculation (min) 44 min    Activity Tolerance Patient tolerated treatment well    Behavior During Therapy WFL for tasks assessed/performed             Past Medical History:  Diagnosis Date   Anemia    h/o    Arthritis    Claustrophobia    Diverticulosis    Fibrocystic breast disease (FCBD)    Fibrocystic disease of breast    GERD (gastroesophageal reflux disease)    Herpes simplex    HLD (hyperlipidemia)    Hypercholesteremia    Hyperlipidemia    Hypertension    Hypothyroidism    Pre-diabetes    Pulmonary nodules    Pure hypercholesterolemia    Rotator cuff tear    LEFT SHOULDER   Sleep apnea    no cpap   Vitamin D deficiency    Vitamin D deficiency     Past Surgical History:  Procedure Laterality Date   ABDOMINAL HYSTERECTOMY     BREAST BIOPSY Left 06/19/2020   stereo biopsy/ x clip/ path pending   BREAST EXCISIONAL BIOPSY Left 70/80s   benign   CHOLECYSTECTOMY     COLONOSCOPY WITH PROPOFOL N/A 03/22/2015   Procedure: COLONOSCOPY WITH PROPOFOL;  Surgeon: Hulen Luster, MD;  Location[facility] ENDOSCOPY;  Service: Gastroenterology;  Laterality: N/A;   DG FOOT HEEL ([facility] HX)     DILATION AND CURETTAGE OF UTERUS     ESOPHAGOGASTRODUODENOSCOPY (EGD) WITH PROPOFOL N/A 03/22/2015   Procedure: ESOPHAGOGASTRODUODENOSCOPY (EGD) WITH PROPOFOL;  Surgeon: Hulen Luster, MD;  Location: [facility] ENDOSCOPY;  Service:  Gastroenterology;  Laterality: N/A;   KNEE ARTHROPLASTY Right 09/23/2017   Procedure: COMPUTER ASSISTED TOTAL KNEE ARTHROPLASTY;  Surgeon: Dereck Leep, MD;  Location: [facility] ORS;  Service: Orthopedics;  Laterality: Right;   KNEE ARTHROSCOPY Right 08/02/2015   Procedure: ARTHROSCOPY KNEE, PARTIAL MEDIAL MENISECTOMY, PLICA INCISION;  Surgeon: Hessie Knows, MD;  Location: [facility] ORS;  Service: Orthopedics;  Laterality: Right;   KNEE ARTHROSCOPY      There were no vitals filed for this visit.   Subjective Assessment - 01/23/21 1548     Subjective Neck is stiff. 7/10 neck pain currently. Felt better after last session.    Pertinent History Neck pain. Pain began several weeks ago sudden onset, no known method of injury. No imaging for her neck yet. 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 Currently doing chores such as taking trash out, take the groceries in, lifiting the hamper. Pt is R hand dominant. Used to have L hand pareshtesia. Currently does not have the hand symptoms. Pt also states having L low back pain.    Patient Stated Goals Get rid of the burning in her neck.    Currently in Pain? Yes    Pain Score 7  PT Education - 01/23/21 2100     Education provided Yes    Education Details ther-ex    [employer]) Educated Patient    Methods Explanation;Demonstration;Tactile cues;Verbal cues    Comprehension Returned demonstration;Verbalized understanding            Objective   No latex allergies Blood pressure is controlled per pt. No falls within the past 6 months. Fear of falling does not limit her but she is more carefull   [employer] Access Code [email or web address]     Manual therapy    Reclined position STM cervical paraspinal muscles. Especially around C4 to decrease tension  STM B levator scapulae R > L      Therapeutic exercise    Seated manually resisted L cervical lateral shift isometrics in neutral 10x3 with 5  seconds  Reclined:   Cervical nodding 1 min x 3   Cervical rotation 1 min x 2   Scapular retraction 10x5 seconds for 2 sets   Cervical side bend stretch    L 5x2 with 10 second holds     Increased discomfort with R cervical rotation    R 5x2 with 10 second holds     Slight decrease in pain with R cervical rotation      Improved exercise technique, movement at target joints, use of target muscles after mod verbal, visual, tactile cues.    Response to treatment/Clinical impression   Fair tolerance to today's session. Worked on improving anterior cervical muscle strength as well as decreasing muscle tension around neck. 6/10 neck pain with R cervical rotation after session.  Difficulty with getting out of the reclined position secondary to B LE pain. Pt will benefit from continued skilled physical therapy services to decrease pain, improve strength and function.      PT Short Term Goals - 12/27/20 1420       PT SHORT TERM GOAL #1   Title Patient will be independent with her initial HEP to improve ability to turn her head without pain.    Baseline Has been doing her HEPno questions. (12/27/2020)    Time 3    Period Weeks    Status Achieved    Target Date 11/15/20               PT Long Term Goals - 01/08/21 1441       PT LONG TERM GOAL #1   Title Pt will have a decrease in neck pain to 2/10 or less at worst to promote ability to turn her head more comfortably.    Baseline 9/10 neck pain at most for the past 3 months (11/14/2018); 6-7/10 at most for the past 7 days (12/27/2020); 8/10 (pt helped lift a 75-80 lb box yesterday) 01/08/2021    Time 4    Period Weeks    Status On-going    Target Date 02/07/21      PT LONG TERM GOAL #2   Title Pt will improve her neck FOTO by at least 10 points as a demonstration of improved function.    Baseline Neck FOTO 57 (11/13/2020); 42 (12/27/2020)    Time 4    Period Weeks    Status On-going    Target Date 02/07/21                    Plan - 01/23/21 2100     Clinical Impression Statement Fair tolerance to today's session. Worked on improving anterior cervical muscle strength as well as decreasing muscle tension  around neck. 6/10 neck pain with R cervical rotation after session.   Difficulty with getting out of the reclined position secondary to B LE pain. Pt will benefit from continued skilled physical therapy services to decrease pain, improve strength and function.    Personal Factors and Comorbidities Age;Comorbidity 2;Fitness;Past/Current Experience;Other    Comorbidities Arthritis, HTN    Examination-Activity Limitations Other    Stability/Clinical Decision Making Stable/Uncomplicated    Rehab Potential Fair    Clinical Impairments Affecting Rehab Potential pain, insurance    PT Frequency 2x / week    PT Duration 8 weeks    PT Treatment/Interventions Therapeutic activities;Therapeutic exercise;Neuromuscular re-education;Manual techniques;Dry needling;Electrical Stimulation;Iontophoresis 4mg /ml Dexamethasone;Traction    PT Next Visit Plan scapular strengthening, posture, manual techniques, modalities PRN    PT Home Exercise Plan Scapular retraction holds    Consulted and Agree with Plan of Care Patient             Patient will benefit from skilled therapeutic intervention in order to improve the following deficits and impairments:  Pain, Postural dysfunction, Improper body mechanics  Visit Diagnosis: Cervicalgia  Radiculopathy, cervical region     Problem List Patient Active Problem List   Diagnosis Date Noted   S/P total knee arthroplasty 09/23/2017   Diabetes mellitus type 2, uncomplicated ([place]) 81/44/8185   Hypertension 09/22/2017   Hypothyroidism, unspecified 09/22/2017   Vitamin D deficiency, unspecified 09/22/2017   Edema of both legs 03/03/2017   BMI 40.0-44.9, adult ([place]) 12/03/2015   Pure hypercholesterolemia 05/26/2014    Joneen Boers PT, DPT   01/23/2021, 9:11  PM  [facility] [facility] [address] [place], [address] Phone: [phone]   Fax:  [phone]  Name: Dana Morton MRN: [id number] Date of Birth: September 19, 1950

## 2021-01-29 ENCOUNTER — Ambulatory Visit: Payer: Medicare Other

## 2021-01-30 ENCOUNTER — Ambulatory Visit: Payer: Medicare Other

## 2021-01-31 ENCOUNTER — Ambulatory Visit: Payer: Medicare Other

## 2021-02-04 ENCOUNTER — Ambulatory Visit: Payer: Medicare Other

## 2021-02-04 DIAGNOSIS — M542 Cervicalgia: Secondary | ICD-10-CM | POA: Diagnosis not present

## 2021-02-04 DIAGNOSIS — M5412 Radiculopathy, cervical region: Secondary | ICD-10-CM

## 2021-02-04 NOTE — Therapy (Signed)
[facility] [address], [place], [address] Phone: [phone]   Fax:  [phone]  Physical Therapy Treatment  Patient Details  Name: Dana Morton MRN: [id number] Date of Birth: 1951-04-16 Referring Provider (PT): Earnie Larsson, MD   Encounter Date: 02/04/2021   PT End of Session - 02/04/21 1550     Visit Number 18    Number of Visits 25    Date for PT Re-Evaluation 02/07/21    Authorization Type 8    Authorization Time Period 10    PT Start Time [id number]    PT Stop Time [id number]    PT Time Calculation (min) 41 min    Activity Tolerance Patient tolerated treatment well    Behavior During Therapy WFL for tasks assessed/performed             Past Medical History:  Diagnosis Date   Anemia    h/o    Arthritis    Claustrophobia    Diverticulosis    Fibrocystic breast disease (FCBD)    Fibrocystic disease of breast    GERD (gastroesophageal reflux disease)    Herpes simplex    HLD (hyperlipidemia)    Hypercholesteremia    Hyperlipidemia    Hypertension    Hypothyroidism    Pre-diabetes    Pulmonary nodules    Pure hypercholesterolemia    Rotator cuff tear    LEFT SHOULDER   Sleep apnea    no cpap   Vitamin D deficiency    Vitamin D deficiency     Past Surgical History:  Procedure Laterality Date   ABDOMINAL HYSTERECTOMY     BREAST BIOPSY Left 06/19/2020   stereo biopsy/ x clip/ path pending   BREAST EXCISIONAL BIOPSY Left 70/80s   benign   CHOLECYSTECTOMY     COLONOSCOPY WITH PROPOFOL N/A 03/22/2015   Procedure: COLONOSCOPY WITH PROPOFOL;  Surgeon: Hulen Luster, MD;  Location: [facility] ENDOSCOPY;  Service: Gastroenterology;  Laterality: N/A;   DG FOOT HEEL ([facility] HX)     DILATION AND CURETTAGE OF UTERUS     ESOPHAGOGASTRODUODENOSCOPY (EGD) WITH PROPOFOL N/A 03/22/2015   Procedure: ESOPHAGOGASTRODUODENOSCOPY (EGD) WITH PROPOFOL;  Surgeon: Hulen Luster, MD;  Location: [facility] ENDOSCOPY;  Service:  Gastroenterology;  Laterality: N/A;   KNEE ARTHROPLASTY Right 09/23/2017   Procedure: COMPUTER ASSISTED TOTAL KNEE ARTHROPLASTY;  Surgeon: Dereck Leep, MD;  Location: [facility] ORS;  Service: Orthopedics;  Laterality: Right;   KNEE ARTHROSCOPY Right 08/02/2015   Procedure: ARTHROSCOPY KNEE, PARTIAL MEDIAL MENISECTOMY, PLICA INCISION;  Surgeon: Hessie Knows, MD;  Location: [facility] ORS;  Service: Orthopedics;  Laterality: Right;   KNEE ARTHROSCOPY      There were no vitals filed for this visit.   Subjective Assessment - 02/04/21 1552     Subjective Husband needs surgery his back. Wants to put everything on hold until then then come back because she sees some diffierence with her neck. 7/10 neck pain with cervical R and L rotation today.    Pertinent History Neck pain. Pain began several weeks ago sudden onset, no known method of injury. No imaging for her neck yet. 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 Currently doing chores such as taking trash out, take the groceries in, lifiting the hamper. Pt is R hand dominant. Used to have L hand pareshtesia. Currently does not have the hand symptoms. Pt also states having L low back pain.    Patient Stated Goals Get rid of the burning  in her neck.    Currently in Pain? Yes    Pain Score 7                                        PT Education - 02/04/21 1821     Education provided Yes    Education Details ther-ex    Person(s) Educated Patient    Methods Explanation;Demonstration;Tactile cues;Verbal cues    Comprehension Returned demonstration;Verbalized understanding            Objective   No latex allergies Blood pressure is controlled per pt. No falls within the past 6 months. Fear of falling does not limit her but she is more carefull   Medbridge Access Code [email or web address]      Therapeutic exercise    Seated manually resisted scapular retraction targeting the lower trap muscles  R 10x3 with 5 second holds  L 10x3  with 5 second holds   Seated manually resisted scapular depression isometrics   R 10x3 with 5 second holds  L 10x3 with 5 second holds  Seated cervical flexion isometrics 10x3 with 5 second holds, PT manually resisted  Decreased pain with L cervical rotation after aforementioned exercises. Still has symptoms with R cervical rotation    Cervical side bending   To the L 10x to stretch R lateral neck. Discomfort which eases with rest.       Improved exercise technique, movement at target joints, use of target muscles after mod verbal, visual, tactile cues.      Manual therapy Seated STM R upper trap/distal scalene and R cervical paraspinal muscle area   Decreased pain with R cervical rotation     Response to treatment/Clinical impression Pt demonstrates overall decreased neck pain since initial evaluation. Still demonstrates difficulty performing functional tasks based on no improvement in her FOTO scores. Continued working on improving scapular and anterior cervical strength as well as decreasing soft tissue restrictions R cervical paraspinal and upper trap muscle area to promote ability to turn her head more comfortably. Decreased neck pain with cervical rotation after session. Pt will benefit from continued skilled physical therapy services to decrease pain, improve strength and function. PT put on hold after today secondary to pt helping her husband. Pt to call back when able to continue PT.  Challenges to progress include pt being her husband's caregiver and performing all the heavy lifting at home.       PT Short Term Goals - 12/27/20 1420       PT SHORT TERM GOAL #1   Title Patient will be independent with her initial HEP to improve ability to turn her head without pain.    Baseline Has been doing her HEPno questions. (12/27/2020)    Time 3    Period Weeks    Status Achieved    Target Date 11/15/20               PT Long Term Goals - 02/04/21 1555       PT  LONG TERM GOAL #1   Title Pt will have a decrease in neck pain to 2/10 or less at worst to promote ability to turn her head more comfortably.    Baseline 9/10 neck pain at most for the past 3 months (11/14/2018); 6-7/10 at most for the past 7 days (12/27/2020); 8/10 (pt helped lift a 75-80 lb box yesterday) 01/08/2021; 6-7/10 at worst  for the past 7 days (02/04/2021)    Time 4    Period Weeks    Status On-going    Target Date 02/07/21      PT LONG TERM GOAL #2   Title Pt will improve her neck FOTO by at least 10 points as a demonstration of improved function.    Baseline Neck FOTO 57 (11/13/2020); 42 (12/27/2020),  (02/04/2021)    Time 4    Period Weeks    Status On-going    Target Date 02/07/21                   Plan - 02/04/21 1822     Clinical Impression Statement Pt demonstrates overall decreased neck pain since initial evaluation. Still demonstrates difficulty performing functional tasks based on no improvement in her FOTO scores. Continued working on improving scapular and anterior cervical strength as well as decreasing soft tissue restrictions R cervical paraspinal and upper trap muscle area to promote ability to turn her head more comfortably. Decreased neck pain with cervical rotation after session. Pt will benefit from continued skilled physical therapy services to decrease pain, improve strength and function. PT put on hold after today secondary to pt helping her husband. Pt to call back when able to continue PT. Challenges to progress include pt being her husband's caregiver and performing all the heavy lifting at home.    Personal Factors and Comorbidities Age;Comorbidity 2;Fitness;Past/Current Experience;Other    Comorbidities Arthritis, HTN    Examination-Activity Limitations Other    Stability/Clinical Decision Making Stable/Uncomplicated    Rehab Potential Fair    Clinical Impairments Affecting Rehab Potential pain, insurance    PT Frequency 2x / week    PT Duration 8  weeks    PT Treatment/Interventions Therapeutic activities;Therapeutic exercise;Neuromuscular re-education;Manual techniques;Dry needling;Electrical Stimulation;Iontophoresis '4mg'$ /ml Dexamethasone;Traction    PT Next Visit Plan scapular strengthening, posture, manual techniques, modalities PRN    PT Home Exercise Plan Scapular retraction holds    Consulted and Agree with Plan of Care Patient             Patient will benefit from skilled therapeutic intervention in order to improve the following deficits and impairments:  Pain, Postural dysfunction, Improper body mechanics  Visit Diagnosis: Cervicalgia  Radiculopathy, cervical region     Problem List Patient Active Problem List   Diagnosis Date Noted   S/P total knee arthroplasty 09/23/2017   Diabetes mellitus type 2, uncomplicated ([place]) [id number]   Hypertension 09/22/2017   Hypothyroidism, unspecified 09/22/2017   Vitamin D deficiency, unspecified 09/22/2017   Edema of both legs 03/03/2017   BMI 40.0-44.9, adult ([place]) 12/03/2015   Pure hypercholesterolemia 05/26/2014    Joneen Boers PT, DPT   02/04/2021, 6:29 PM  [facility] [address], [place], [address] Phone: [phone]   Fax:  [phone]  Name: Dana Morton MRN: [id number] Date of Birth: 14-Oct-1950

## 2021-02-06 ENCOUNTER — Ambulatory Visit: Payer: Medicare Other

## 2021-04-18 ENCOUNTER — Encounter: Payer: Self-pay | Admitting: *Deleted

## 2021-04-19 ENCOUNTER — Ambulatory Visit: Payer: Medicare Other | Admitting: Anesthesiology

## 2021-04-19 ENCOUNTER — Encounter: Payer: Self-pay | Admitting: *Deleted

## 2021-04-19 ENCOUNTER — Ambulatory Visit
Admission: RE | Admit: 2021-04-19 | Discharge: 2021-04-19 | Disposition: A | Discharge status: Home / Self Care | Payer: Medicare Other | Attending: Gastroenterology | Admitting: Gastroenterology

## 2021-04-19 ENCOUNTER — Encounter
Admission: RE | Disposition: A | Discharge status: Home / Self Care | Payer: Self-pay | Source: Home / Self Care | Attending: Gastroenterology

## 2021-04-19 ENCOUNTER — Other Ambulatory Visit: Payer: Self-pay

## 2021-04-19 DIAGNOSIS — K219 Gastro-esophageal reflux disease without esophagitis: Secondary | ICD-10-CM | POA: Insufficient documentation

## 2021-04-19 DIAGNOSIS — K5730 Diverticulosis of large intestine without perforation or abscess without bleeding: Secondary | ICD-10-CM | POA: Insufficient documentation

## 2021-04-19 DIAGNOSIS — Z9049 Acquired absence of other specified parts of digestive tract: Secondary | ICD-10-CM | POA: Insufficient documentation

## 2021-04-19 DIAGNOSIS — D123 Benign neoplasm of transverse colon: Secondary | ICD-10-CM | POA: Insufficient documentation

## 2021-04-19 DIAGNOSIS — Z6841 Body Mass Index (BMI) 40.0 and over, adult: Secondary | ICD-10-CM | POA: Insufficient documentation

## 2021-04-19 DIAGNOSIS — E119 Type 2 diabetes mellitus without complications: Secondary | ICD-10-CM | POA: Insufficient documentation

## 2021-04-19 DIAGNOSIS — I10 Essential (primary) hypertension: Secondary | ICD-10-CM | POA: Insufficient documentation

## 2021-04-19 DIAGNOSIS — Z1211 Encounter for screening for malignant neoplasm of colon: Secondary | ICD-10-CM | POA: Insufficient documentation

## 2021-04-19 DIAGNOSIS — Z800 Family history of malignant neoplasm of digestive organs: Secondary | ICD-10-CM | POA: Diagnosis not present

## 2021-04-19 HISTORY — PX: COLONOSCOPY WITH PROPOFOL: SHX5780

## 2021-04-19 LAB — GLUCOSE, CAPILLARY: Glucose-Capillary: 117 mg/dL — ABNORMAL HIGH (ref 70–99)

## 2021-04-19 SURGERY — COLONOSCOPY WITH PROPOFOL
Anesthesia: General

## 2021-04-19 MED ORDER — PROPOFOL 10 MG/ML IV BOLUS
INTRAVENOUS | Status: DC | PRN
  Administered 2021-04-19: 50 mg via INTRAVENOUS

## 2021-04-19 MED ORDER — PROPOFOL 500 MG/50ML IV EMUL
INTRAVENOUS | Status: DC | PRN
  Administered 2021-04-19: 200 ug/kg/min via INTRAVENOUS

## 2021-04-19 MED ORDER — SODIUM CHLORIDE 0.9 % IV SOLN: INTRAVENOUS | Status: DC

## 2021-04-19 NOTE — Anesthesia Postprocedure Evaluation (Signed)
Anesthesia Post Note  Patient: Dana Morton  Procedure(s) Performed: COLONOSCOPY WITH PROPOFOL  Patient location during evaluation: PACU Anesthesia Type: General Level of consciousness: awake and alert, oriented and patient cooperative Pain management: pain level controlled Vital Signs Assessment: post-procedure vital signs reviewed and stable Respiratory status: spontaneous breathing, nonlabored ventilation and respiratory function stable Cardiovascular status: blood pressure returned to baseline and stable Postop Assessment: adequate PO intake Anesthetic complications: no   No notable events documented.   Last Vitals:  Vitals:   04/19/21 1130 04/19/21 1140  BP:  112/65  Pulse: 62 (!) 57  Resp: 14 17  Temp:    SpO2: 100% 100%    Last Pain:  Vitals:   04/19/21 1011  TempSrc: Temporal  PainSc: 0-No pain                 Darrin Nipper

## 2021-04-19 NOTE — Op Note (Signed)
[facility] Gastroenterology Patient Name: Dana Morton Procedure Date: 04/19/2021 10:44 AM MRN: [id number] Account #: [id number] Date of Birth: [birth year] Admit Type: Outpatient Age: 70 Room: [facility] ENDO ROOM 3 Gender: Female Note Status: Finalized Instrument Name: Jasper Riling [id number] Procedure:             Colonoscopy Indications:           Screening patient at increased risk: Family history of                         1st-degree relative with colorectal cancer at age 39                         years (or older) Providers:             Andrey Farmer MD, MD Referring MD:          Ramonita Lab, MD (Referring MD) Medicines:             Monitored Anesthesia Care Complications:         No immediate complications. Estimated blood loss:                         Minimal. Procedure:             Pre-Anesthesia Assessment:                        - Prior to the procedure, a History and Physical was                         performed, and patient medications and allergies were                         reviewed. The patient is competent. The risks and                         benefits of the procedure and the sedation options and                         risks were discussed with the patient. All questions                         were answered and informed consent was obtained.                         Patient identification and proposed procedure were                         verified by the physician, the nurse, the anesthetist                         and the technician in the endoscopy suite. Mental                         Status Examination: alert and oriented. Airway                         Examination: normal oropharyngeal airway and neck  mobility. Respiratory Examination: clear to                         auscultation. CV Examination: normal. Prophylactic                         Antibiotics: The patient does not require prophylactic                          antibiotics. Prior Anticoagulants: The patient has                         taken no previous anticoagulant or antiplatelet                         agents. ASA Grade Assessment: III - A patient with                         severe systemic disease. After reviewing the risks and                         benefits, the patient was deemed in satisfactory                         condition to undergo the procedure. The anesthesia                         plan was to use monitored anesthesia care (MAC).                         Immediately prior to administration of medications,                         the patient was re-assessed for adequacy to receive                         sedatives. The heart rate, respiratory rate, oxygen                         saturations, blood pressure, adequacy of pulmonary                         ventilation, and response to care were monitored                         throughout the procedure. The physical status of the                         patient was re-assessed after the procedure.                        After obtaining informed consent, the colonoscope was                         passed under direct vision. Throughout the procedure,                         the patient's blood pressure, pulse, and oxygen  saturations were monitored continuously. The                         Colonoscope was introduced through the anus and                         advanced to the the cecum, identified by appendiceal                         orifice and ileocecal valve. The colonoscopy was                         performed without difficulty. The patient tolerated                         the procedure well. The quality of the bowel                         preparation was good. Findings:      The perianal and digital rectal examinations were normal.      A 2 mm polyp was found in the hepatic flexure. The polyp was sessile.       The polyp was removed with a cold  snare. Resection and retrieval were       complete. Estimated blood loss was minimal.      Scattered small-mouthed diverticula were found in the sigmoid colon,       descending colon and transverse colon.      The exam was otherwise without abnormality on direct and retroflexion       views. Impression:            - One 2 mm polyp at the hepatic flexure, removed with                         a cold snare. Resected and retrieved.                        - Diverticulosis in the sigmoid colon, in the                         descending colon and in the transverse colon.                        - The examination was otherwise normal on direct and                         retroflexion views. Recommendation:        - Discharge patient to home.                        - Resume previous diet.                        - Continue present medications.                        - Await pathology results.                        - Repeat colonoscopy for surveillance based on  pathology results.                        - Return to referring physician as previously                         scheduled. Procedure Code(s):     --- Professional ---                        [phone], Colonoscopy, flexible; with removal of                         tumor(s), polyp(s), or other lesion(s) by snare                         technique Diagnosis Code(s):     --- Professional ---                        Z80.0, Family history of malignant neoplasm of                         digestive organs                        K63.5, Polyp of colon                        K57.30, Diverticulosis of large intestine without                         perforation or abscess without bleeding CPT copyright 2019 American Medical Association. All rights reserved. The codes documented in this report are preliminary and upon coder review may  be revised to meet current compliance requirements. Andrey Farmer MD, MD 04/19/2021 11:13:26  AM Number of Addenda: 0 Note Initiated On: 04/19/2021 10:44 AM Scope Withdrawal Time: 0 hours 9 minutes 51 seconds  Total Procedure Duration: 0 hours 14 minutes 26 seconds  Estimated Blood Loss:  Estimated blood loss was minimal.      [facility]

## 2021-04-19 NOTE — Interval H&P Note (Signed)
History and Physical Interval Note:  04/19/2021 10:45 AM  Dana Morton  has presented today for surgery, with the diagnosis of FM HX COLON CA.  The various methods of treatment have been discussed with the patient and family. After consideration of risks, benefits and other options for treatment, the patient has consented to  Procedure(s) with comments: COLONOSCOPY WITH PROPOFOL (N/A) - AMPICILLIN - PER OFFICE as a surgical intervention.  The patient's history has been reviewed, patient examined, no change in status, stable for surgery.  I have reviewed the patient's chart and labs.  Questions were answered to the patient's satisfaction.     Lesly Rubenstein  Ok to proceed with colonoscopy

## 2021-04-19 NOTE — Transfer of Care (Signed)
Immediate Anesthesia Transfer of Care Note  Patient: Dana Morton  Procedure(s) Performed: COLONOSCOPY WITH PROPOFOL  Patient Location: PACU  Anesthesia Type:General  Level of Consciousness: awake, alert  and oriented  Airway & Oxygen Therapy: Patient Spontanous Breathing and Patient connected to nasal cannula oxygen  Post-op Assessment: Report given to RN and Post -op Vital signs reviewed and stable  Post vital signs: Reviewed and stable  Last Vitals:  Vitals Value Taken Time  BP 80/46 04/19/21 1117  Temp    Pulse 65 04/19/21 1117  Resp 16 04/19/21 1117  SpO2 96 % 04/19/21 1117  Vitals shown include unvalidated device data.  Last Pain:  Vitals:   04/19/21 1011  TempSrc: Temporal  PainSc: 0-No pain         Complications: No notable events documented.

## 2021-04-19 NOTE — Anesthesia Preprocedure Evaluation (Signed)
Anesthesia Evaluation  Patient identified by MRN, date of birth, ID band Patient awake    Reviewed: Allergy & Precautions, NPO status , Patient's Chart, lab work & pertinent test results  History of Anesthesia Complications Negative for: history of anesthetic complications  Airway Mallampati: II   Neck ROM: Full    Dental no notable dental hx.    Pulmonary sleep apnea and Continuous Positive Airway Pressure Ventilation ,    Pulmonary exam normal breath sounds clear to auscultation       Cardiovascular hypertension, Normal cardiovascular exam Rhythm:Regular Rate:Normal     Neuro/Psych    GI/Hepatic GERD  ,  Endo/Other  diabetes, Type 2Hypothyroidism Class 3 obesity  Renal/GU negative Renal ROS     Musculoskeletal   Abdominal   Peds  Hematology negative hematology ROS (+)   Anesthesia Other Findings   Reproductive/Obstetrics                             Anesthesia Physical Anesthesia Plan  ASA: 3  Anesthesia Plan: General   Post-op Pain Management:    Induction: Intravenous  PONV Risk Score and Plan: 3 and Propofol infusion, TIVA and Treatment may vary due to age or medical condition  Airway Management Planned: Natural Airway  Additional Equipment:   Intra-op Plan:   Post-operative Plan:   Informed Consent: I have reviewed the patients History and Physical, chart, labs and discussed the procedure including the risks, benefits and alternatives for the proposed anesthesia with the patient or authorized representative who has indicated his/her understanding and acceptance.       Plan Discussed with: CRNA  Anesthesia Plan Comments:         Anesthesia Quick Evaluation

## 2021-04-19 NOTE — H&P (Signed)
Outpatient short stay form Pre-procedure 04/19/2021  Lesly Rubenstein, MD  Primary Physician: Adin Hector, MD  Reason for visit:  Screening  History of present illness:   70 y/o lady with history of obesity, hypertension, and GERD here for colonoscopy due to family history of colon cancer. Unclear if actual colon primary as she states it was prostate cancer that was in the colon. History of cholecystectomy. No blood thinners. No new symptoms.    Current Facility-Administered Medications:    0.9 %  sodium chloride infusion, , Intravenous, Continuous, Lucah Petta, Hilton Cork, MD, Last Rate: 20 mL/hr at 04/19/21 1031, New Bag at 04/19/21 1031  Medications Prior to Admission  Medication Sig Dispense Refill Last Dose   bisoprolol (ZEBETA) 10 MG tablet Take 10 mg by mouth daily.   04/18/2021   Calcium Carbonate-Vitamin D (CALCIUM 600+D PO) Take 1 tablet by mouth daily.   04/18/2021 at 0800   cetirizine (ZYRTEC) 10 MG tablet Take 10 mg by mouth daily.   04/18/2021 at 0800   Cholecalciferol 25 MCG (1000 UT) tablet Take 1,000 Units by mouth daily.   04/18/2021 at 0800   HYDROcodone-acetaminophen (NORCO) 10-325 MG tablet Take 1-2 tablets by mouth 2 (two) times daily as needed. For severe back pain.  0 04/18/2021 at 2100   levothyroxine (SYNTHROID, LEVOTHROID) 125 MCG tablet Take 125 mcg by mouth daily before breakfast.   04/19/2021   lisinopril (PRINIVIL,ZESTRIL) 40 MG tablet Take 40 mg by mouth at bedtime.    04/18/2021 at 2100   lovastatin (MEVACOR) 40 MG tablet Take 40 mg by mouth at bedtime.   04/18/2021 at 2100   Magnesium 500 MG CAPS Take 500 mg by mouth daily.   04/18/2021 at 0800   Multiple Vitamin (MULTIVITAMIN WITH MINERALS) TABS tablet Take 1 tablet by mouth daily. Centrum Silver for Women   04/18/2021 at 0800   omeprazole (PRILOSEC) 20 MG capsule Take 20 mg by mouth daily before breakfast.    04/18/2021   bisoprolol-hydrochlorothiazide (ZIAC) 5-6.25 MG tablet Take by mouth.         Allergies  Allergen Reactions   Celebrex [Celecoxib] Nausea Only   Codeine Nausea Only     Past Medical History:  Diagnosis Date   Anemia    h/o    Arthritis    Claustrophobia    Diabetes mellitus without complication (HCC)    Diverticulosis    Fibrocystic breast disease (FCBD)    Fibrocystic disease of breast    GERD (gastroesophageal reflux disease)    Herpes simplex    HLD (hyperlipidemia)    Hypercholesteremia    Hyperlipidemia    Hypertension    Hypothyroidism    Pre-diabetes    Pulmonary nodules    Pure hypercholesterolemia    Rotator cuff tear    LEFT SHOULDER   Sleep apnea    no cpap   Vitamin D deficiency    Vitamin D deficiency     Review of systems:  Otherwise negative.    Physical Exam  Gen: Alert, oriented. Appears stated age.  HEENT: PERRLA. Lungs: No respiratory distress CV: RRR Abd: soft, benign, no masses Ext: No edema    Planned procedures: Proceed with colonoscopy. The patient understands the nature of the planned procedure, indications, risks, alternatives and potential complications including but not limited to bleeding, infection, perforation, damage to internal organs and possible oversedation/side effects from anesthesia. The patient agrees and gives consent to proceed.  Please refer to procedure notes for findings, recommendations  and patient disposition/instructions.     Lesly Rubenstein, MD [facility] Gastroenterology

## 2021-04-22 ENCOUNTER — Encounter: Payer: Self-pay | Admitting: Gastroenterology

## 2021-04-22 LAB — SURGICAL PATHOLOGY

## 2021-06-12 ENCOUNTER — Other Ambulatory Visit: Payer: Self-pay | Admitting: Internal Medicine

## 2021-06-12 DIAGNOSIS — Z1231 Encounter for screening mammogram for malignant neoplasm of breast: Secondary | ICD-10-CM

## 2021-06-14 ENCOUNTER — Other Ambulatory Visit: Payer: Self-pay

## 2021-06-14 ENCOUNTER — Ambulatory Visit
Admission: RE | Admit: 2021-06-14 | Discharge: 2021-06-14 | Disposition: A | Discharge status: Home / Self Care | Payer: Medicare Other | Source: Ambulatory Visit | Attending: Internal Medicine | Admitting: Internal Medicine

## 2021-06-14 DIAGNOSIS — Z1231 Encounter for screening mammogram for malignant neoplasm of breast: Secondary | ICD-10-CM | POA: Diagnosis present

## 2022-07-18 ENCOUNTER — Other Ambulatory Visit: Payer: Self-pay | Admitting: Internal Medicine

## 2022-07-18 DIAGNOSIS — Z1231 Encounter for screening mammogram for malignant neoplasm of breast: Secondary | ICD-10-CM

## 2022-07-21 ENCOUNTER — Ambulatory Visit
Admission: RE | Admit: 2022-07-21 | Discharge: 2022-07-21 | Disposition: A | Discharge status: Home / Self Care | Payer: Medicare Other | Source: Ambulatory Visit | Attending: Internal Medicine | Admitting: Internal Medicine

## 2022-07-21 DIAGNOSIS — Z1231 Encounter for screening mammogram for malignant neoplasm of breast: Secondary | ICD-10-CM

## 2023-05-07 ENCOUNTER — Other Ambulatory Visit: Payer: Self-pay | Admitting: Internal Medicine

## 2023-05-07 DIAGNOSIS — R4702 Dysphasia: Secondary | ICD-10-CM

## 2023-05-07 DIAGNOSIS — I10 Essential (primary) hypertension: Secondary | ICD-10-CM

## 2023-05-07 DIAGNOSIS — R413 Other amnesia: Secondary | ICD-10-CM

## 2023-05-12 ENCOUNTER — Other Ambulatory Visit: Payer: Self-pay

## 2023-05-12 ENCOUNTER — Emergency Department: Payer: Medicare Other

## 2023-05-12 ENCOUNTER — Emergency Department
Admission: EM | Admit: 2023-05-12 | Discharge: 2023-05-12 | Disposition: A | Discharge status: Home / Self Care | Payer: Medicare Other | Attending: Emergency Medicine | Admitting: Emergency Medicine

## 2023-05-12 DIAGNOSIS — R4701 Aphasia: Secondary | ICD-10-CM | POA: Diagnosis present

## 2023-05-12 DIAGNOSIS — E039 Hypothyroidism, unspecified: Secondary | ICD-10-CM | POA: Insufficient documentation

## 2023-05-12 DIAGNOSIS — E119 Type 2 diabetes mellitus without complications: Secondary | ICD-10-CM | POA: Diagnosis not present

## 2023-05-12 DIAGNOSIS — G9389 Other specified disorders of brain: Secondary | ICD-10-CM | POA: Diagnosis not present

## 2023-05-12 DIAGNOSIS — R479 Unspecified speech disturbances: Secondary | ICD-10-CM

## 2023-05-12 DIAGNOSIS — I10 Essential (primary) hypertension: Secondary | ICD-10-CM | POA: Insufficient documentation

## 2023-05-12 LAB — CBC
HCT: 41.9 % (ref 36.0–46.0)
Hemoglobin: 14.4 g/dL (ref 12.0–15.0)
MCH: 32.3 pg (ref 26.0–34.0)
MCHC: 34.4 g/dL (ref 30.0–36.0)
MCV: 93.9 fL (ref 80.0–100.0)
Platelets: 262 10*3/uL (ref 150–400)
RBC: 4.46 MIL/uL (ref 3.87–5.11)
RDW: 13 % (ref 11.5–15.5)
WBC: 10 10*3/uL (ref 4.0–10.5)
nRBC: 0 % (ref 0.0–0.2)

## 2023-05-12 LAB — PROTIME-INR
INR: 1.1 (ref 0.8–1.2)
Prothrombin Time: 14.4 s (ref 11.4–15.2)

## 2023-05-12 LAB — DIFFERENTIAL
Abs Immature Granulocytes: 0.04 10*3/uL (ref 0.00–0.07)
Basophils Absolute: 0.1 10*3/uL (ref 0.0–0.1)
Basophils Relative: 1 %
Eosinophils Absolute: 0.2 10*3/uL (ref 0.0–0.5)
Eosinophils Relative: 2 %
Immature Granulocytes: 0 %
Lymphocytes Relative: 51 %
Lymphs Abs: 5.2 10*3/uL — ABNORMAL HIGH (ref 0.7–4.0)
Monocytes Absolute: 0.8 10*3/uL (ref 0.1–1.0)
Monocytes Relative: 8 %
Neutro Abs: 3.8 10*3/uL (ref 1.7–7.7)
Neutrophils Relative %: 38 %

## 2023-05-12 LAB — COMPREHENSIVE METABOLIC PANEL
ALT: 20 U/L (ref 0–44)
AST: 38 U/L (ref 15–41)
Albumin: 3.9 g/dL (ref 3.5–5.0)
Alkaline Phosphatase: 51 U/L (ref 38–126)
Anion gap: 9 (ref 5–15)
BUN: 14 mg/dL (ref 8–23)
CO2: 28 mmol/L (ref 22–32)
Calcium: 9.2 mg/dL (ref 8.9–10.3)
Chloride: 99 mmol/L (ref 98–111)
Creatinine, Ser: 1.18 mg/dL — ABNORMAL HIGH (ref 0.44–1.00)
GFR, Estimated: 48 mL/min — ABNORMAL LOW (ref >60)
Glucose, Bld: 118 mg/dL — ABNORMAL HIGH (ref 70–99)
Potassium: 3.9 mmol/L (ref 3.5–5.1)
Sodium: 136 mmol/L (ref 135–145)
Total Bilirubin: 0.8 mg/dL (ref 0.3–1.2)
Total Protein: 7.5 g/dL (ref 6.5–8.1)

## 2023-05-12 LAB — ETHANOL: Alcohol, Ethyl (B): <10 mg/dL (ref <10)

## 2023-05-12 LAB — APTT: aPTT: 30 s (ref 24–36)

## 2023-05-12 MED ORDER — GADOBUTROL 1 MMOL/ML IV SOLN
10.0000 mL | Freq: Once | INTRAVENOUS | Status: AC | PRN
  Administered 2023-05-12: 10 mL via INTRAVENOUS

## 2023-05-12 MED ORDER — LORAZEPAM 2 MG/ML IJ SOLN
1.0000 mg | Freq: Once | INTRAMUSCULAR | Status: AC | PRN
  Administered 2023-05-12: 1 mg via INTRAVENOUS
  Filled 2023-05-12: qty 1

## 2023-05-12 NOTE — ED Triage Notes (Signed)
Pt here with weakness. Pt states she has slurred speech that started 2 weeks ago. Denies pain, pt states she has had balance issues that started around the same time.

## 2023-05-12 NOTE — Discharge Instructions (Addendum)
You were seen in the emergency department today for evaluation of your speech difficulty.  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  I have included information for the neurosurgery team at [facility].  If you have not heard from them by tomorrow, please call them to arrange close follow-up.  Return to the ER for any new or worsening symptoms.

## 2023-05-12 NOTE — ED Notes (Signed)
Pt removed herself from cardiac monitor and sts " I dont want to be tied up in the bed."

## 2023-05-12 NOTE — Consult Note (Signed)
Consulting Department:  Emergency department  Primary Physician:  Lynnea Ferrier, MD  Chief Complaint: Language issues  History of Present Illness: 05/12/2023 Dana Morton is a 72 y.o. female who presents with the chief complaint of language issues.  Feels like this started relatively recently within the past 2 to 4 weeks.  She is not having any headaches.  No weakness.  Her husband states that she will often say sentences and not finish the thoughts.  She has not had any new weakness numbness or tingling.  No seizures.  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  Family history of cancer.   Review of Systems:  A 10 point review of systems is negative, except for the pertinent positives and negatives detailed in the HPI.  Past Medical History: Past Medical History:  Diagnosis Date   Anemia    h/o    Arthritis    Claustrophobia    Diabetes mellitus without complication (HCC)    Diverticulosis    Fibrocystic breast disease (FCBD)    Fibrocystic disease of breast    GERD (gastroesophageal reflux disease)    Herpes simplex    HLD (hyperlipidemia)    Hypercholesteremia    Hyperlipidemia    Hypertension    Hypothyroidism    Pre-diabetes    Pulmonary nodules    Pure hypercholesterolemia    Rotator cuff tear    LEFT SHOULDER   Sleep apnea    no cpap   Vitamin D deficiency    Vitamin D deficiency     Past Surgical History: Past Surgical History:  Procedure Laterality Date   ABDOMINAL HYSTERECTOMY     BREAST BIOPSY Left 06/19/2020   stereo biopsy/ x clip/ path pending   BREAST EXCISIONAL BIOPSY Left 70/80s   benign   CHOLECYSTECTOMY     COLONOSCOPY WITH PROPOFOL N/A 03/22/2015   Procedure: COLONOSCOPY WITH PROPOFOL;  Surgeon: Wallace Cullens, MD;  Location: [facility] ENDOSCOPY;  Service: Gastroenterology;  Laterality: N/A;   COLONOSCOPY WITH PROPOFOL N/A 04/19/2021   Procedure: COLONOSCOPY WITH PROPOFOL;  Surgeon: Regis Bill, MD;  Location: [facility] ENDOSCOPY;   Service: Endoscopy;  Laterality: N/A;  AMPICILLIN - PER OFFICE   DG FOOT HEEL ([facility] HX)     DILATION AND CURETTAGE OF UTERUS     ESOPHAGOGASTRODUODENOSCOPY (EGD) WITH PROPOFOL N/A 03/22/2015   Procedure: ESOPHAGOGASTRODUODENOSCOPY (EGD) WITH PROPOFOL;  Surgeon: Wallace Cullens, MD;  Location: [facility] ENDOSCOPY;  Service: Gastroenterology;  Laterality: N/A;   JOINT REPLACEMENT     KNEE ARTHROPLASTY Right 09/23/2017   Procedure: COMPUTER ASSISTED TOTAL KNEE ARTHROPLASTY;  Surgeon: Donato Heinz, MD;  Location: [facility] ORS;  Service: Orthopedics;  Laterality: Right;   KNEE ARTHROSCOPY Right 08/02/2015   Procedure: ARTHROSCOPY KNEE, PARTIAL MEDIAL MENISECTOMY, PLICA INCISION;  Surgeon: Kennedy Bucker, MD;  Location: [facility] ORS;  Service: Orthopedics;  Laterality: Right;   KNEE ARTHROSCOPY      Allergies: Allergies as of 05/12/2023 - Review Complete 05/12/2023  Allergen Reaction Noted   Celebrex [celecoxib] Nausea Only 10/17/2013   Codeine Nausea Only 03/22/2015    Medications: No current facility-administered medications for this encounter.  Current Outpatient Medications:    bisoprolol (ZEBETA) 10 MG tablet, Take 10 mg by mouth daily., Disp: , Rfl:    bisoprolol-hydrochlorothiazide (ZIAC) 5-6.25 MG tablet, Take by mouth., Disp: , Rfl:    Calcium Carbonate-Vitamin D (CALCIUM 600+D PO), Take 1 tablet by mouth daily., Disp: , Rfl:    cetirizine (ZYRTEC) 10 MG tablet, Take  10 mg by mouth daily., Disp: , Rfl:    Cholecalciferol 25 MCG (1000 UT) tablet, Take 1,000 Units by mouth daily., Disp: , Rfl:    HYDROcodone-acetaminophen (NORCO) 10-325 MG tablet, Take 1-2 tablets by mouth 2 (two) times daily as needed. For severe back pain., Disp: , Rfl: 0   levothyroxine (SYNTHROID, LEVOTHROID) 125 MCG tablet, Take 125 mcg by mouth daily before breakfast., Disp: , Rfl:    lisinopril (PRINIVIL,ZESTRIL) 40 MG tablet, Take 40 mg by mouth at bedtime. , Disp: , Rfl:    lovastatin (MEVACOR) 40 MG tablet, Take 40 mg by  mouth at bedtime., Disp: , Rfl:    Magnesium 500 MG CAPS, Take 500 mg by mouth daily., Disp: , Rfl:    Multiple Vitamin (MULTIVITAMIN WITH MINERALS) TABS tablet, Take 1 tablet by mouth daily. Centrum Silver for Women, Disp: , Rfl:    omeprazole (PRILOSEC) 20 MG capsule, Take 20 mg by mouth daily before breakfast. , Disp: , Rfl:    Social History: Social History   Tobacco Use   Smoking status: Never   Smokeless tobacco: Never  Vaping Use   Vaping status: Never Used  Substance Use Topics   Alcohol use: No   Drug use: No    Family Medical History: Family History  Problem Relation Age of Onset   Breast cancer Neg Hx     Physical Examination: Vitals:   05/12/23 1343  BP: 128/63  Pulse: 64  Resp: 17  Temp: 98.2 F (36.8 C)  SpO2: 96%     General: Patient is well developed, well nourished, calm, collected, and in no apparent distress.  NEUROLOGICAL:  General: In no acute distress.   Awake and conversant.  Patient has some intermittent word substitutions.  She is able to repeat statements.  She is able to understand spoken commands.  She does have some intermittent difficulty with naming.  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 Pupils equal round and reactive to light.  Full Facial tone is symmetric.  Tongue protrusion is midline.  Bilateral upper extremities are full strength proximally and distally.  There is no pronator drift.    GCS:15   Bilateral upper and lower extremity sensation is intact to light touch.  Imaging: Narrative & Impression  CLINICAL DATA:  Neuro deficit, acute, stroke suspected. Weakness and slurred speech.   EXAM: CT HEAD WITHOUT CONTRAST   TECHNIQUE: Contiguous axial images were obtained from the base of the skull through the vertex without intravenous contrast.   RADIATION DOSE REDUCTION: This exam was performed according to the departmental dose-optimization program which includes automated exposure control, adjustment of the mA and/or kV  according to patient size and/or use of iterative reconstruction technique.   COMPARISON:  None Available.   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 No acute hemorrhage. No hydrocephalus or extra-axial collection.   Vascular: No hyperdense vessel or unexpected calcification.   Skull: No calvarial fracture or suspicious bone lesion. Skull base is unremarkable.   Sinuses/Orbits: No acute finding.   Other: None.   IMPRESSION: 1. 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 Recommend brain MRI with and without contrast for further evaluation. 2. 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  These results were called by telephone at the time of interpretation on 05/12/2023 at 4:34 pm to provider Dr. Rosalia Hammers , who verbally acknowledged these results.     Electronically Signed   By: Orvan Falconer M.D.   On: 05/12/2023 16:35    I have personally reviewed the images and agree with the above interpretation.  Labs:    Latest Ref Rng & Units 05/12/2023    1:47 PM 09/09/2017    8:39 AM 01/16/2017   11:34 AM  CBC  WBC 4.0 - 10.5 K/uL 10.0  7.8  11.2   Hemoglobin 12.0 - 15.0 g/dL 82.9  56.2  13.0   Hematocrit 36.0 - 46.0 % 41.9  41.7  41.2   Platelets 150 - 400 K/uL 262  226  241        Assessment and Plan: Ms. Amon is a pleasant 72 y.o. female with 2+ week history of worsening language issues.  Her husband states that she will often say things and not finish her thoughts.  On history she has not had any seizures weakness numbness or tingling.  No new headaches.  She states that she does sometimes notice that she is having trouble with her speaking.  She was  recently seen by a neurologist who recommended she come to the emergency department.  CT scan demonstrated a large cystic-appearing mass in the left temporal lobe, also potentially a hypodensity noted near the basal ganglia.  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  Given the findings on the CT scan we would recommend a MRI with and without contrast to evaluate this lesion.  Should there be multiple lesions are to be concern for possible metastatic disease and should have a metastatic workup.  If this was found to be in isolation we will discuss further plan going forward.  Lovenia Kim, MD/MSCR Dept. of Neurosurgery

## 2023-05-12 NOTE — ED Provider Notes (Signed)
[facility] Provider Note    Event Date/Time   First MD Initiated Contact with Patient 05/12/23 1645     (approximate)   History   Weakness   HPI  Dana Morton is a 72 year old female with history of hypertension, hypothyroidism, diabetes presenting to the emergency department with difficulty with speech.  About 2 weeks ago patient began to notice she had trouble  getting out her words.  Does feel like this has been worsening over the past couple weeks.  Also reports some mild memory deficits.  No vision changes, headache, focal numbness, tingling, weakness, changes in gait, trauma.  History of skin cancer per chart review.       Physical Exam   Triage Vital Signs: ED Triage Vitals  Encounter Vitals Group     BP 05/12/23 1343 128/63     Systolic BP Percentile --      Diastolic BP Percentile --      Pulse Rate 05/12/23 1343 64     Resp 05/12/23 1343 17     Temp 05/12/23 1343 98.2 F (36.8 C)     Temp Source 05/12/23 1343 Oral     SpO2 05/12/23 1343 96 %     Weight 05/12/23 1345 274 lb 14.6 oz (124.7 kg)     Height 05/12/23 1345 5\' 6"  (1.676 m)     Head Circumference --      Peak Flow --      Pain Score 05/12/23 1345 0     Pain Loc --      Pain Education --      Exclude from Growth Chart --     Most recent vital signs: Vitals:   05/12/23 1830 05/12/23 2246  BP: 126/72 124/74  Pulse: 63 70  Resp: (!) 23 20  Temp: 98 F (36.7 C) 98.4 F (36.9 C)  SpO2: 96% 98%     General: Awake, interactive  CV:  Regular rate, good peripheral perfusion.  Resp:  Unlabored respirations.  Abd:  Nondistended.  Neuro:  Alert and oriented, normal extraocular movements, symmetric facial movement, sensation intact over bilateral upper and lower extremities with 5 out of 5 strength.  Normal finger-to-nose testing.  Gait with minimal instability that patient and husband confirm is her baseline.   ED Results / Procedures / Treatments   Labs (all labs  ordered are listed, but only abnormal results are displayed) Labs Reviewed  DIFFERENTIAL - Abnormal; Notable for the following components:      Result Value   Lymphs Abs 5.2 (*)    All other components within normal limits  COMPREHENSIVE METABOLIC PANEL - Abnormal; Notable for the following components:   Glucose, Bld 118 (*)    Creatinine, Ser 1.18 (*)    GFR, Estimated 48 (*)    All other components within normal limits  PROTIME-INR  APTT  CBC  ETHANOL  CBG MONITORING, ED     EKG EKG independently reviewed interpreted by myself (ER attending) demonstrates:  EKG demonstrates sinus rhythm rate of 63, PR 152, QR 78, QTc 413, no acute ST changes  RADIOLOGY Imaging independently reviewed and interpreted by myself demonstrates:  CT head with large mass over the left temporal lobe, radiology notes this is cystic in appearance with an associated left uncal herniation and 4 mm of right midline shift as well as a chronic appearing infarct in the right basal ganglia  PROCEDURES:  Critical Care performed: No  Procedures   MEDICATIONS ORDERED IN ED:  Medications  LORazepam (ATIVAN) injection 1 mg (1 mg Intravenous Given 05/12/23 1851)  gadobutrol (GADAVIST) 1 MMOL/ML injection 10 mL (10 mLs Intravenous Contrast Given 05/12/23 2002)     IMPRESSION / MDM / ASSESSMENT AND PLAN / ED COURSE  I reviewed the triage vital signs and the nursing notes.  Differential diagnosis includes, but is not limited to, subacute CVA, intracranial bleed, intracranial mass, other acute intracranial process  Patient's presentation is most consistent with acute presentation with potential threat to life or bodily function.  72 year old female presenting with difficulty with speech over the past 2 weeks.  Mild speech difficulty on exam but overall well-appearing with stable vitals.  CT with concerning findings including mass with midline shift and uncal herniation.  Reviewed with Dr. Katrinka Blazing.  He will evaluate  the patient to ensure stability, but ultimately suspect patient will require an MRI for further evaluation.  Does recommend holding off on steroids until further identification of mass can take place.  MRI redemonstrated temporal lobe mass, per radiology most likely representative of primary brain neoplasm.  Reviewed with Dr. Katrinka Blazing.  He does feel that patient can be discharged with outpatient referral to [facility] that he will help facilitate.  Discussed results of workup with patient.  She is comfortable with this plan.  Information for follow-up with neurosurgery provided.  Strict return precautions provided.  Patient discharged in stable condition.    FINAL CLINICAL IMPRESSION(S) / ED DIAGNOSES   Final diagnoses:  Mass of left temporal lobe  Speech disturbance, unspecified type     Rx / DC Orders   ED Discharge Orders     None        Note:  This document was prepared using Dragon voice recognition software and may include unintentional dictation errors.   Trinna Post, MD 05/12/23 2252

## 2023-07-06 ENCOUNTER — Ambulatory Visit
Admission: RE | Admit: 2023-07-06 | Discharge: 2023-07-06 | Disposition: A | Discharge status: Home / Self Care | Payer: Medicare Other | Source: Ambulatory Visit | Attending: Internal Medicine | Admitting: Internal Medicine

## 2023-07-06 ENCOUNTER — Other Ambulatory Visit: Payer: Self-pay | Admitting: Internal Medicine

## 2023-07-06 DIAGNOSIS — M79602 Pain in left arm: Secondary | ICD-10-CM | POA: Diagnosis present

## 2023-08-05 ENCOUNTER — Emergency Department: Payer: Medicare Other

## 2023-08-05 ENCOUNTER — Other Ambulatory Visit: Payer: Self-pay

## 2023-08-05 ENCOUNTER — Emergency Department
Admission: EM | Admit: 2023-08-05 | Discharge: 2023-08-06 | Disposition: A | Discharge status: Other Acute Inpatient Hospital | Payer: Medicare Other | Attending: Emergency Medicine | Admitting: Emergency Medicine

## 2023-08-05 DIAGNOSIS — Z20822 Contact with and (suspected) exposure to covid-19: Secondary | ICD-10-CM | POA: Insufficient documentation

## 2023-08-05 DIAGNOSIS — N390 Urinary tract infection, site not specified: Secondary | ICD-10-CM | POA: Diagnosis not present

## 2023-08-05 DIAGNOSIS — C719 Malignant neoplasm of brain, unspecified: Secondary | ICD-10-CM | POA: Diagnosis not present

## 2023-08-05 DIAGNOSIS — R4182 Altered mental status, unspecified: Secondary | ICD-10-CM | POA: Insufficient documentation

## 2023-08-05 DIAGNOSIS — D696 Thrombocytopenia, unspecified: Secondary | ICD-10-CM

## 2023-08-05 DIAGNOSIS — Z8673 Personal history of transient ischemic attack (TIA), and cerebral infarction without residual deficits: Secondary | ICD-10-CM | POA: Insufficient documentation

## 2023-08-05 DIAGNOSIS — Z9221 Personal history of antineoplastic chemotherapy: Secondary | ICD-10-CM | POA: Insufficient documentation

## 2023-08-05 DIAGNOSIS — J9811 Atelectasis: Secondary | ICD-10-CM | POA: Diagnosis not present

## 2023-08-05 DIAGNOSIS — I6782 Cerebral ischemia: Secondary | ICD-10-CM | POA: Insufficient documentation

## 2023-08-05 DIAGNOSIS — E119 Type 2 diabetes mellitus without complications: Secondary | ICD-10-CM | POA: Insufficient documentation

## 2023-08-05 DIAGNOSIS — Z9049 Acquired absence of other specified parts of digestive tract: Secondary | ICD-10-CM | POA: Insufficient documentation

## 2023-08-05 DIAGNOSIS — E039 Hypothyroidism, unspecified: Secondary | ICD-10-CM | POA: Insufficient documentation

## 2023-08-05 DIAGNOSIS — Z923 Personal history of irradiation: Secondary | ICD-10-CM | POA: Diagnosis not present

## 2023-08-05 DIAGNOSIS — Z85841 Personal history of malignant neoplasm of brain: Secondary | ICD-10-CM | POA: Insufficient documentation

## 2023-08-05 DIAGNOSIS — R569 Unspecified convulsions: Secondary | ICD-10-CM | POA: Diagnosis present

## 2023-08-05 LAB — RESP PANEL BY RT-PCR (RSV, FLU A&B, COVID)  RVPGX2
Influenza A by PCR: NEGATIVE
Influenza B by PCR: NEGATIVE
Resp Syncytial Virus by PCR: NEGATIVE
SARS Coronavirus 2 by RT PCR: NEGATIVE

## 2023-08-05 LAB — CBC WITH DIFFERENTIAL/PLATELET
Abs Immature Granulocytes: 0.01 10*3/uL (ref 0.00–0.07)
Abs Immature Granulocytes: 0.01 10*3/uL (ref 0.00–0.07)
Basophils Absolute: 0 10*3/uL (ref 0.0–0.1)
Basophils Absolute: 0 10*3/uL (ref 0.0–0.1)
Basophils Relative: 0 %
Basophils Relative: 0 %
Eosinophils Absolute: 0.2 10*3/uL (ref 0.0–0.5)
Eosinophils Absolute: 0.2 10*3/uL (ref 0.0–0.5)
Eosinophils Relative: 6 %
Eosinophils Relative: 7 %
HCT: 34 % — ABNORMAL LOW (ref 36.0–46.0)
HCT: 27.7 % — ABNORMAL LOW (ref 36.0–46.0)
Hemoglobin: 11.5 g/dL — ABNORMAL LOW (ref 12.0–15.0)
Hemoglobin: 9.5 g/dL — ABNORMAL LOW (ref 12.0–15.0)
Immature Granulocytes: 0 %
Immature Granulocytes: 0 %
Lymphocytes Relative: 35 %
Lymphocytes Relative: 36 %
Lymphs Abs: 1.1 10*3/uL (ref 0.7–4.0)
Lymphs Abs: 1.2 10*3/uL (ref 0.7–4.0)
MCH: 33 pg (ref 26.0–34.0)
MCH: 32.9 pg (ref 26.0–34.0)
MCHC: 33.8 g/dL (ref 30.0–36.0)
MCHC: 34.3 g/dL (ref 30.0–36.0)
MCV: 97.4 fL (ref 80.0–100.0)
MCV: 95.8 fL (ref 80.0–100.0)
Monocytes Absolute: 0.1 10*3/uL (ref 0.1–1.0)
Monocytes Absolute: 0.1 10*3/uL (ref 0.1–1.0)
Monocytes Relative: 3 %
Monocytes Relative: 3 %
Neutro Abs: 1.8 10*3/uL (ref 1.7–7.7)
Neutro Abs: 1.8 10*3/uL (ref 1.7–7.7)
Neutrophils Relative %: 56 %
Neutrophils Relative %: 54 %
Platelets: 8 10*3/uL — CL (ref 150–400)
Platelets: 28 10*3/uL — CL (ref 150–400)
RBC: 3.49 MIL/uL — ABNORMAL LOW (ref 3.87–5.11)
RBC: 2.89 MIL/uL — ABNORMAL LOW (ref 3.87–5.11)
RDW: 14.2 % (ref 11.5–15.5)
RDW: 14 % (ref 11.5–15.5)
Smear Review: DECREASED
WBC: 3.2 10*3/uL — ABNORMAL LOW (ref 4.0–10.5)
WBC: 3.2 10*3/uL — ABNORMAL LOW (ref 4.0–10.5)
nRBC: 0 % (ref 0.0–0.2)
nRBC: 0 % (ref 0.0–0.2)

## 2023-08-05 LAB — COMPREHENSIVE METABOLIC PANEL
ALT: 36 U/L (ref 0–44)
AST: 61 U/L — ABNORMAL HIGH (ref 15–41)
Albumin: 3.2 g/dL — ABNORMAL LOW (ref 3.5–5.0)
Alkaline Phosphatase: 79 U/L (ref 38–126)
Anion gap: 11 (ref 5–15)
BUN: 20 mg/dL (ref 8–23)
CO2: 23 mmol/L (ref 22–32)
Calcium: 8.8 mg/dL — ABNORMAL LOW (ref 8.9–10.3)
Chloride: 102 mmol/L (ref 98–111)
Creatinine, Ser: 1.13 mg/dL — ABNORMAL HIGH (ref 0.44–1.00)
GFR, Estimated: 51 mL/min — ABNORMAL LOW (ref >60)
Glucose, Bld: 184 mg/dL — ABNORMAL HIGH (ref 70–99)
Potassium: 4 mmol/L (ref 3.5–5.1)
Sodium: 136 mmol/L (ref 135–145)
Total Bilirubin: 0.9 mg/dL (ref 0.0–1.2)
Total Protein: 7 g/dL (ref 6.5–8.1)

## 2023-08-05 LAB — URINALYSIS, W/ REFLEX TO CULTURE (INFECTION SUSPECTED)
Bilirubin Urine: NEGATIVE
Glucose, UA: NEGATIVE mg/dL
Hgb urine dipstick: NEGATIVE
Ketones, ur: NEGATIVE mg/dL
Nitrite: POSITIVE — AB
Protein, ur: 30 mg/dL — AB
Specific Gravity, Urine: 1.025 (ref 1.005–1.030)
pH: 5 (ref 5.0–8.0)

## 2023-08-05 LAB — RETICULOCYTES
Immature Retic Fract: 3.6 % (ref 2.3–15.9)
RBC.: 2.93 MIL/uL — ABNORMAL LOW (ref 3.87–5.11)
Retic Count, Absolute: 15.5 10*3/uL — ABNORMAL LOW (ref 19.0–186.0)
Retic Ct Pct: 0.5 % (ref 0.4–3.1)

## 2023-08-05 LAB — LACTIC ACID, PLASMA
Lactic Acid, Venous: 2.2 mmol/L (ref 0.5–1.9)
Lactic Acid, Venous: 1.6 mmol/L (ref 0.5–1.9)

## 2023-08-05 LAB — FIBRINOGEN: Fibrinogen: 286 mg/dL (ref 210–475)

## 2023-08-05 LAB — CBC
HCT: 31.1 % — ABNORMAL LOW (ref 36.0–46.0)
Hemoglobin: 10.5 g/dL — ABNORMAL LOW (ref 12.0–15.0)
MCH: 33.1 pg (ref 26.0–34.0)
MCHC: 33.8 g/dL (ref 30.0–36.0)
MCV: 98.1 fL (ref 80.0–100.0)
Platelets: 6 10*3/uL — CL (ref 150–400)
RBC: 3.17 MIL/uL — ABNORMAL LOW (ref 3.87–5.11)
RDW: 14.1 % (ref 11.5–15.5)
WBC: 3.2 10*3/uL — ABNORMAL LOW (ref 4.0–10.5)
nRBC: 0 % (ref 0.0–0.2)

## 2023-08-05 LAB — LACTATE DEHYDROGENASE: LDH: 149 U/L (ref 98–192)

## 2023-08-05 LAB — TROPONIN I (HIGH SENSITIVITY)
Troponin I (High Sensitivity): 5 ng/L (ref <18)
Troponin I (High Sensitivity): 7 ng/L (ref <18)

## 2023-08-05 LAB — CBG MONITORING, ED: Glucose-Capillary: 164 mg/dL — ABNORMAL HIGH (ref 70–99)

## 2023-08-05 LAB — PROTIME-INR
INR: 1 (ref 0.8–1.2)
Prothrombin Time: 13.3 s (ref 11.4–15.2)

## 2023-08-05 LAB — APTT: aPTT: 26 s (ref 24–36)

## 2023-08-05 MED ORDER — SODIUM CHLORIDE 0.9 % IV SOLN: 3000.0000 mg | Freq: Once | INTRAVENOUS | Status: DC

## 2023-08-05 MED ORDER — LEVETIRACETAM IN NACL 1000 MG/100ML IV SOLN
1000.0000 mg | Freq: Once | INTRAVENOUS | Status: AC
Start: 2023-08-05 — End: 2023-08-05
  Administered 2023-08-05: 1000 mg via INTRAVENOUS
  Filled 2023-08-05: qty 100

## 2023-08-05 MED ORDER — GADOBUTROL 1 MMOL/ML IV SOLN
10.0000 mL | Freq: Once | INTRAVENOUS | Status: AC | PRN
  Administered 2023-08-05: 10 mL via INTRAVENOUS

## 2023-08-05 MED ORDER — LACTATED RINGERS IV BOLUS
1000.0000 mL | Freq: Once | INTRAVENOUS | Status: AC
  Administered 2023-08-05: 1000 mL via INTRAVENOUS

## 2023-08-05 MED ORDER — SODIUM CHLORIDE 0.9 % IV SOLN: 10.0000 mL/h | Freq: Once | INTRAVENOUS | Status: DC

## 2023-08-05 MED ORDER — LEVETIRACETAM IN NACL 1500 MG/100ML IV SOLN
1500.0000 mg | Freq: Once | INTRAVENOUS | Status: AC
  Administered 2023-08-05: 1500 mg via INTRAVENOUS
  Filled 2023-08-05: qty 100

## 2023-08-05 MED ORDER — LEVETIRACETAM IN NACL 1000 MG/100ML IV SOLN
1000.0000 mg | Freq: Two times a day (BID) | INTRAVENOUS | Status: DC
Start: 2023-08-05 — End: 2023-08-06
  Administered 2023-08-05 – 2023-08-06 (×3): 1000 mg via INTRAVENOUS
  Filled 2023-08-05 (×3): qty 100

## 2023-08-05 MED ORDER — LORAZEPAM 2 MG/ML IJ SOLN
1.0000 mg | Freq: Once | INTRAMUSCULAR | Status: AC
  Administered 2023-08-05: 1 mg via INTRAVENOUS
  Filled 2023-08-05: qty 1

## 2023-08-05 MED ORDER — ONDANSETRON HCL 4 MG/2ML IJ SOLN
4.0000 mg | Freq: Once | INTRAMUSCULAR | Status: AC
  Administered 2023-08-05: 4 mg via INTRAVENOUS
  Filled 2023-08-05: qty 2

## 2023-08-05 MED ORDER — SODIUM CHLORIDE 0.9 % IV SOLN
1.0000 g | Freq: Once | INTRAVENOUS | Status: AC
Start: 2023-08-05 — End: 2023-08-05
  Administered 2023-08-05: 1 g via INTRAVENOUS
  Filled 2023-08-05: qty 10

## 2023-08-05 MED ORDER — MORPHINE SULFATE (PF) 2 MG/ML IV SOLN
2.0000 mg | Freq: Once | INTRAVENOUS | Status: AC
  Administered 2023-08-05: 2 mg via INTRAVENOUS
  Filled 2023-08-05: qty 1
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
## 2023-08-05 NOTE — ED Notes (Signed)
CALLED  [facility]  TRANSFER  [facility]  SPOKE  WITH  [facility] PT  STILL ON  WAITLIST  INFORMED  DR  Larinda Buttery MD
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
## 2023-08-05 NOTE — Progress Notes (Signed)
Brief Hematology note-   Dana Morton is a 73 year old female with a diagnosis of glioblastoma IDH wild-type, WHO grade 4 status post resection and completion of concurrent temozolomide and RT on 07/31/2023.  She follows with neuro-oncology at [facility] and now looking to transfer care to [facility] neuro-oncology.  His platelets from 07/31/2023 was 114.  Presented to [facility] ED on 1/22 with concerns for seizures.  CT head with no evidence of acute changes.  On lab work, WBC 3.2, hemoglobin 10.5 and platelet of 6000.  PT/INR is normal.  APTT, fibrinogen, LDH, haptoglobin is pending.  Total bilirubin is normal.  On smear review, no concerns for schistocytes.  She is in the process of getting 2 units of platelet transfusion.  Assessment and plan  Severe thrombocytopenia GBM status postresection recently completed concurrent chemo RT  -Platelets 6000.  Her severe thrombocytopenia could be related to Temodar use.  In 20% of the cases, it can cause severe grade 3 and 4 thrombocytopenia.  Agree with 2 units of platelet transfusion.  I have reached out to RN to draw CBC 30-minute post platelet transfusion to assess response.  -Continue with supportive care with platelet transfusion less than 10,000 or less than 30,000 with bleeding.  -Patient has been accepted for transfer to [facility].  Pending transportation with weather conditions.  # Seizure -Loaded with IV Keppra.  CT head with no evidence of acute event.  Dr. Marcell Barlow neurosurgery and teleneurology has been involved.  Recommended MRI brain with and without contrast.

## 2023-08-05 NOTE — ED Notes (Signed)
Larinda Buttery, MD, made aware of lactic 2.2

## 2023-08-05 NOTE — Consult Note (Signed)
Patient asleep and comfortable.  Seizure earlier today. Neuro consulted - rec continuous EEG and xfer.  Patient accepted to [facility].  HCT and MRI reviewed - no obvious hemorrhage or recurrence.  She has follow-up MRI planned for Feb 4.  - no acute surgical intervention needed. - will contact her surgeon to keep him informed.

## 2023-08-05 NOTE — ED Notes (Signed)
This RN called to the bedside due to patient complaining of difficulty breathing.. Pt does not appear to have labored breathing.. respirations a little tachypneic at 21, oxygen sats 96% on 3L [place]. Pt alert but confused. Unable to answer questions appropriately. Very weak throughout unable to move legs and limited on following commands. Pt has slurred speech and incomprehensible words. Difficulty understanding. Provider notified.

## 2023-08-05 NOTE — ED Notes (Signed)
Lab called to collect blood work.

## 2023-08-05 NOTE — ED Notes (Signed)
Telespecialist called to speak with Dr. Dolores Frame

## 2023-08-05 NOTE — ED Notes (Signed)
Spoke with Rep Quin from [facility]/ Consult to transfer/Specialty- Neuro ICU/ facesheet faxed & images powershared

## 2023-08-05 NOTE — ED Triage Notes (Signed)
Pt from home for witnessed focal seizures, 5 Versed PTA with no response. Hx of tumor removal behind ear @ [facility] 4w ago per EMS. EMS VS 89% 4 L's, 92/37

## 2023-08-05 NOTE — ED Notes (Signed)
Notified Dr. Dolores Frame of platelets of 8, will complete orders when received.

## 2023-08-05 NOTE — ED Notes (Signed)
CALLED  [facility] TRANSFER [facility]  SPOKE  WITH  LORI  PT STILL ON  WAITLIST

## 2023-08-05 NOTE — ED Notes (Signed)
Patient transported to MRI 

## 2023-08-05 NOTE — ED Notes (Signed)
CALLED  [facility]  TRANSFER  [facility]  PT  ON  WAITLIST  FOR  BED INFORMED DR  Larinda Buttery  MD

## 2023-08-05 NOTE — ED Notes (Signed)
Neurologist via teleneuro speaking and assessing patient.

## 2023-08-05 NOTE — ED Notes (Signed)
Call teleneuro spoke with Rep Muqadas/ Requested stat consult on Cart 1

## 2023-08-06 ENCOUNTER — Encounter (HOSPITAL_COMMUNITY): Payer: Self-pay

## 2023-08-06 ENCOUNTER — Other Ambulatory Visit: Payer: Self-pay

## 2023-08-06 ENCOUNTER — Emergency Department: Payer: Medicare Other

## 2023-08-06 ENCOUNTER — Telehealth (HOSPITAL_COMMUNITY): Payer: Self-pay | Admitting: Emergency Medicine

## 2023-08-06 ENCOUNTER — Inpatient Hospital Stay (HOSPITAL_COMMUNITY)
Admission: EM | Admit: 2023-08-06 | Discharge: 2023-08-12 | DRG: 100 | Disposition: A | Discharge status: Other Acute Inpatient Hospital | Payer: Medicare Other | Attending: Internal Medicine | Admitting: Internal Medicine

## 2023-08-06 DIAGNOSIS — Z6837 Body mass index (BMI) 37.0-37.9, adult: Secondary | ICD-10-CM

## 2023-08-06 DIAGNOSIS — D701 Agranulocytosis secondary to cancer chemotherapy: Secondary | ICD-10-CM | POA: Diagnosis not present

## 2023-08-06 DIAGNOSIS — E785 Hyperlipidemia, unspecified: Secondary | ICD-10-CM

## 2023-08-06 DIAGNOSIS — Z96651 Presence of right artificial knee joint: Secondary | ICD-10-CM | POA: Diagnosis present

## 2023-08-06 DIAGNOSIS — G4730 Sleep apnea, unspecified: Secondary | ICD-10-CM | POA: Diagnosis present

## 2023-08-06 DIAGNOSIS — Z9049 Acquired absence of other specified parts of digestive tract: Secondary | ICD-10-CM

## 2023-08-06 DIAGNOSIS — T451X5A Adverse effect of antineoplastic and immunosuppressive drugs, initial encounter: Secondary | ICD-10-CM | POA: Diagnosis not present

## 2023-08-06 DIAGNOSIS — R4701 Aphasia: Secondary | ICD-10-CM | POA: Diagnosis not present

## 2023-08-06 DIAGNOSIS — G40911 Epilepsy, unspecified, intractable, with status epilepticus: Principal | ICD-10-CM | POA: Diagnosis present

## 2023-08-06 DIAGNOSIS — F40240 Claustrophobia: Secondary | ICD-10-CM | POA: Diagnosis present

## 2023-08-06 DIAGNOSIS — Z886 Allergy status to analgesic agent status: Secondary | ICD-10-CM | POA: Diagnosis not present

## 2023-08-06 DIAGNOSIS — Z923 Personal history of irradiation: Secondary | ICD-10-CM | POA: Diagnosis not present

## 2023-08-06 DIAGNOSIS — D6959 Other secondary thrombocytopenia: Secondary | ICD-10-CM | POA: Diagnosis not present

## 2023-08-06 DIAGNOSIS — I10 Essential (primary) hypertension: Secondary | ICD-10-CM | POA: Diagnosis not present

## 2023-08-06 DIAGNOSIS — Z85841 Personal history of malignant neoplasm of brain: Secondary | ICD-10-CM

## 2023-08-06 DIAGNOSIS — G40901 Epilepsy, unspecified, not intractable, with status epilepticus: Secondary | ICD-10-CM | POA: Diagnosis not present

## 2023-08-06 DIAGNOSIS — E039 Hypothyroidism, unspecified: Secondary | ICD-10-CM | POA: Diagnosis not present

## 2023-08-06 DIAGNOSIS — D61810 Antineoplastic chemotherapy induced pancytopenia: Secondary | ICD-10-CM | POA: Diagnosis present

## 2023-08-06 DIAGNOSIS — Z79899 Other long term (current) drug therapy: Secondary | ICD-10-CM | POA: Diagnosis not present

## 2023-08-06 DIAGNOSIS — N3000 Acute cystitis without hematuria: Secondary | ICD-10-CM | POA: Diagnosis present

## 2023-08-06 DIAGNOSIS — D696 Thrombocytopenia, unspecified: Secondary | ICD-10-CM

## 2023-08-06 DIAGNOSIS — K219 Gastro-esophageal reflux disease without esophagitis: Secondary | ICD-10-CM | POA: Diagnosis not present

## 2023-08-06 DIAGNOSIS — D6481 Anemia due to antineoplastic chemotherapy: Secondary | ICD-10-CM | POA: Diagnosis present

## 2023-08-06 DIAGNOSIS — G40909 Epilepsy, unspecified, not intractable, without status epilepticus: Secondary | ICD-10-CM

## 2023-08-06 DIAGNOSIS — R040 Epistaxis: Secondary | ICD-10-CM | POA: Diagnosis not present

## 2023-08-06 DIAGNOSIS — E119 Type 2 diabetes mellitus without complications: Secondary | ICD-10-CM | POA: Diagnosis not present

## 2023-08-06 DIAGNOSIS — Z885 Allergy status to narcotic agent status: Secondary | ICD-10-CM

## 2023-08-06 DIAGNOSIS — D61818 Other pancytopenia: Secondary | ICD-10-CM

## 2023-08-06 DIAGNOSIS — M1990 Unspecified osteoarthritis, unspecified site: Secondary | ICD-10-CM | POA: Diagnosis present

## 2023-08-06 DIAGNOSIS — E7800 Pure hypercholesterolemia, unspecified: Secondary | ICD-10-CM | POA: Diagnosis present

## 2023-08-06 DIAGNOSIS — E876 Hypokalemia: Secondary | ICD-10-CM | POA: Diagnosis present

## 2023-08-06 DIAGNOSIS — Z79890 Hormone replacement therapy (postmenopausal): Secondary | ICD-10-CM

## 2023-08-06 DIAGNOSIS — C719 Malignant neoplasm of brain, unspecified: Secondary | ICD-10-CM | POA: Diagnosis present

## 2023-08-06 DIAGNOSIS — B9620 Unspecified Escherichia coli [E. coli] as the cause of diseases classified elsewhere: Secondary | ICD-10-CM | POA: Diagnosis present

## 2023-08-06 DIAGNOSIS — Z751 Person awaiting admission to adequate facility elsewhere: Secondary | ICD-10-CM

## 2023-08-06 DIAGNOSIS — Z800 Family history of malignant neoplasm of digestive organs: Secondary | ICD-10-CM | POA: Diagnosis not present

## 2023-08-06 DIAGNOSIS — Z90710 Acquired absence of both cervix and uterus: Secondary | ICD-10-CM

## 2023-08-06 DIAGNOSIS — D709 Neutropenia, unspecified: Secondary | ICD-10-CM

## 2023-08-06 DIAGNOSIS — R569 Unspecified convulsions: Secondary | ICD-10-CM | POA: Diagnosis present

## 2023-08-06 DIAGNOSIS — Z888 Allergy status to other drugs, medicaments and biological substances status: Secondary | ICD-10-CM

## 2023-08-06 DIAGNOSIS — N6019 Diffuse cystic mastopathy of unspecified breast: Secondary | ICD-10-CM | POA: Diagnosis present

## 2023-08-06 DIAGNOSIS — Z8673 Personal history of transient ischemic attack (TIA), and cerebral infarction without residual deficits: Secondary | ICD-10-CM

## 2023-08-06 LAB — BPAM PLATELET PHERESIS
Blood Product Expiration Date: 202501232359
Blood Product Expiration Date: 202501252359
ISSUE DATE / TIME: 202501220753
ISSUE DATE / TIME: 202501221137
Unit Type and Rh: 6200
Unit Type and Rh: 7300

## 2023-08-06 LAB — PREPARE PLATELET PHERESIS
Unit division: 0
Unit division: 0

## 2023-08-06 LAB — CBC WITH DIFFERENTIAL/PLATELET
Abs Immature Granulocytes: 0.01 10*3/uL (ref 0.00–0.07)
Abs Immature Granulocytes: 0.01 10*3/uL (ref 0.00–0.07)
Basophils Absolute: 0 10*3/uL (ref 0.0–0.1)
Basophils Absolute: 0 10*3/uL (ref 0.0–0.1)
Basophils Relative: 0 %
Basophils Relative: 0 %
Eosinophils Absolute: 0.2 10*3/uL (ref 0.0–0.5)
Eosinophils Absolute: 0.2 10*3/uL (ref 0.0–0.5)
Eosinophils Relative: 7 %
Eosinophils Relative: 6 %
HCT: 27.7 % — ABNORMAL LOW (ref 36.0–46.0)
HCT: 27.6 % — ABNORMAL LOW (ref 36.0–46.0)
Hemoglobin: 9.2 g/dL — ABNORMAL LOW (ref 12.0–15.0)
Hemoglobin: 9.1 g/dL — ABNORMAL LOW (ref 12.0–15.0)
Immature Granulocytes: 0 %
Immature Granulocytes: 0 %
Lymphocytes Relative: 37 %
Lymphocytes Relative: 34 %
Lymphs Abs: 1 10*3/uL (ref 0.7–4.0)
Lymphs Abs: 1.1 10*3/uL (ref 0.7–4.0)
MCH: 33.2 pg (ref 26.0–34.0)
MCH: 32.9 pg (ref 26.0–34.0)
MCHC: 33.2 g/dL (ref 30.0–36.0)
MCHC: 33 g/dL (ref 30.0–36.0)
MCV: 100 fL (ref 80.0–100.0)
MCV: 99.6 fL (ref 80.0–100.0)
Monocytes Absolute: 0.1 10*3/uL (ref 0.1–1.0)
Monocytes Absolute: 0.1 10*3/uL (ref 0.1–1.0)
Monocytes Relative: 3 %
Monocytes Relative: 4 %
Neutro Abs: 1.5 10*3/uL — ABNORMAL LOW (ref 1.7–7.7)
Neutro Abs: 1.7 10*3/uL (ref 1.7–7.7)
Neutrophils Relative %: 53 %
Neutrophils Relative %: 56 %
Platelets: 17 10*3/uL — CL (ref 150–400)
Platelets: 16 10*3/uL — CL (ref 150–400)
RBC: 2.77 MIL/uL — ABNORMAL LOW (ref 3.87–5.11)
RBC: 2.77 MIL/uL — ABNORMAL LOW (ref 3.87–5.11)
RDW: 13.9 % (ref 11.5–15.5)
RDW: 13.8 % (ref 11.5–15.5)
Smear Review: DECREASED
Smear Review: DECREASED
WBC: 2.8 10*3/uL — ABNORMAL LOW (ref 4.0–10.5)
WBC: 3.1 10*3/uL — ABNORMAL LOW (ref 4.0–10.5)
nRBC: 0 % (ref 0.0–0.2)
nRBC: 0 % (ref 0.0–0.2)

## 2023-08-06 LAB — BASIC METABOLIC PANEL
Anion gap: 7 (ref 5–15)
BUN: 15 mg/dL (ref 8–23)
CO2: 23 mmol/L (ref 22–32)
Calcium: 7.2 mg/dL — ABNORMAL LOW (ref 8.9–10.3)
Chloride: 110 mmol/L (ref 98–111)
Creatinine, Ser: 0.89 mg/dL (ref 0.44–1.00)
GFR, Estimated: >60 mL/min (ref >60)
Glucose, Bld: 112 mg/dL — ABNORMAL HIGH (ref 70–99)
Potassium: 3.1 mmol/L — ABNORMAL LOW (ref 3.5–5.1)
Sodium: 140 mmol/L (ref 135–145)

## 2023-08-06 LAB — CBG MONITORING, ED: Glucose-Capillary: 110 mg/dL — ABNORMAL HIGH (ref 70–99)

## 2023-08-06 LAB — HAPTOGLOBIN: Haptoglobin: 120 mg/dL (ref 42–346)

## 2023-08-06 MED ORDER — LORAZEPAM 2 MG/ML IJ SOLN
0.5000 mg | Freq: Once | INTRAMUSCULAR | Status: AC
  Administered 2023-08-06: 0.5 mg via INTRAVENOUS
  Filled 2023-08-06: qty 1

## 2023-08-06 MED ORDER — SODIUM CHLORIDE 0.9 % IV SOLN
100.0000 mg | Freq: Two times a day (BID) | INTRAVENOUS | Status: DC
  Filled 2023-08-06 (×2): qty 10

## 2023-08-06 MED ORDER — LORAZEPAM 2 MG/ML IJ SOLN
0.5000 mg | Freq: Once | INTRAMUSCULAR | Status: DC
Start: 2023-08-06 — End: 2023-08-06

## 2023-08-06 MED ORDER — SODIUM CHLORIDE 0.9 % IV BOLUS
500.0000 mL | Freq: Once | INTRAVENOUS | Status: AC
  Administered 2023-08-06: 500 mL via INTRAVENOUS

## 2023-08-06 MED ORDER — LEVETIRACETAM IN NACL 1000 MG/100ML IV SOLN: 1000.0000 mg | Freq: Two times a day (BID) | INTRAVENOUS | Status: DC

## 2023-08-06 MED ORDER — SODIUM CHLORIDE 0.9 % IV SOLN
1.0000 g | INTRAVENOUS | Status: DC
  Administered 2023-08-06: 1 g via INTRAVENOUS
  Filled 2023-08-06: qty 10

## 2023-08-06 MED ORDER — SODIUM CHLORIDE 0.9 % IV SOLN
100.0000 mg | INTRAVENOUS | Status: AC
  Administered 2023-08-06: 100 mg via INTRAVENOUS
  Filled 2023-08-06: qty 10

## 2023-08-06 MED ORDER — LEVETIRACETAM IN NACL 1000 MG/100ML IV SOLN
1000.0000 mg | Freq: Two times a day (BID) | INTRAVENOUS | Status: DC
  Administered 2023-08-07: 1000 mg via INTRAVENOUS
  Filled 2023-08-06 (×2): qty 100

## 2023-08-06 NOTE — Telephone Encounter (Signed)
Called by Dr Fanny Bien from the [facility] ED. 73 yo F who has been having what appears to be focal seizures in the setting of GBM dx. Getting chemo for her glioblastoma. Had some drooling and abnormal facial movements with it. Seen by teleneurology last night and started keppra 1g BID and they recommended transfer to [facility] but they are at capacity so they are requesting transfer here. She was unable to be admitted to the floor.

## 2023-08-06 NOTE — ED Notes (Signed)
This RN informed provider of pt's down trending BP, with last one MAP of 60. Provider is okay with BP at this time. Will continue to monitor.

## 2023-08-06 NOTE — ED Triage Notes (Addendum)
Pt bib [facility] coming from [facility], admitted for seizure like activity. Pt is here for continuous eeg . Pt had . 5 of ativan prior to [facility] arrival. Seizure like activity was focal seizures per [facility]. Patient alert and oriented to self, disoriented to time, place, and situation.   [facility] vital signs:  1400 mL output in foley  NS 80s HR  110 systolic  96-99% on 3L [place]

## 2023-08-06 NOTE — ED Provider Notes (Signed)
Dr. Hedy Jacob at [facility] no inpatient bed available but ER to ER as an option  Discussed with ED physician Dr. Rolly Pancake at [facility], he accepts the patient in ED to ED transfer.  [facility] aware and will be preparing to provide transport.  I this point Dr. Otelia Limes of neurology also aware and he will be seeing and evaluating the patient at [facility] where anticipate she will likely need admission for continuous EEG monitoring and further workup under the neurology service.  Patient and family agreeable with this plan  Vitals:   08/06/23 1830 08/06/23 1900  BP: (!) 129/47 (!) 136/51  Pulse: 77 75  Resp: 13 13  Temp:  98.2 F (36.8 C)  SpO2: 96% 93%    The patient is stable for ALS transfer to [facility] at this time   Sharyn Creamer, MD 08/06/23 2008

## 2023-08-06 NOTE — ED Notes (Signed)
Called  [facility]  transfer  center  spoke  with Sharia Reeve  pt  still on  waitlist  for  bed  informed  Dr Rosalia Hammers  MD  and  Hale Drone  RN

## 2023-08-06 NOTE — ED Provider Notes (Signed)
  Physical Exam  BP (!) 124/43 (BP Location: Right Arm)   Pulse 71   Temp 98.5 F (36.9 C) (Oral)   Resp (!) 24   SpO2 100%   Physical Exam  Procedures  .Critical Care  Performed by: Darrick Grinder, PA-C Authorized by: Darrick Grinder, PA-C   Critical care provider statement:    Critical care time (minutes):  30   Critical care was necessary to treat or prevent imminent or life-threatening deterioration of the following conditions:  CNS failure or compromise   Critical care was time spent personally by me on the following activities:  Development of treatment plan with patient or surrogate, discussions with consultants, evaluation of patient's response to treatment, examination of patient, ordering and review of laboratory studies, ordering and review of radiographic studies, ordering and performing treatments and interventions, pulse oximetry, re-evaluation of patient's condition and review of old charts   Care discussed with: admitting provider     ED Course / MDM    Medical Decision Making Amount and/or Complexity of Data Reviewed Labs: ordered.  Risk Decision regarding hospitalization.   Patient transferred from [facility] for continuous EEG monitoring.  Patient with past medical history significant for glioblastoma status postcraniotomy on June 10, 2019 for, temozolomide with radiation therapy completed on July 31, 2023 presented to their emergency department with concerns over seizure.  Patient was noted to have what appeared to be focal seizures with twitching of the right side of her face.  Upon initial presentation patient was unable to respond verbally but was able to blink eyes.  She was noted to have right-sided facial twitching.  She did not follow simple commands.  There was no extremity movement noted.  Initial plans were to transfer to [facility] for further management but due to bed availability decision was made for ED to ED transfer to [facility] for continuous EEG as  this was not available at [facility].  Patient has reported intermittent episodes of potential seizure-like activity throughout her stay at [facility].  Upon arrival at [facility] she was able to verbalize that her son was beside her and was making eye contact.  Dr. Otelia Limes aware of patient's arrival, ordered scheduled Vimpat and Keppra.  Has also ordered EEG.  Basic labs were ordered this evening.  CBC significant for platelet count of 16 (improved from 6 2 days ago), hemoglobin stable at 9.1, white count stable at 3.1  I requested consultation with the hospitalist for admission. Dr.Crosley agrees to see patient for admission     Pamala Duffel 08/07/23 0013    Sloan Leiter, DO 08/07/23 [phone]

## 2023-08-06 NOTE — ED Notes (Signed)
CARELINK  CALLED  PER  DR  Fanny Bien  MD

## 2023-08-06 NOTE — ED Provider Notes (Signed)
Emergency Medicine Observation Re-evaluation Note  Dana Morton is a 73 y.o. female, seen on rounds today.  Pt initially presented to the ED for complaints of Seizures  Currently, the patient is awake in bed.  Family did express concerns about possible dehydration to nursing staff.  Family reports patient initially was more somnolent, but is now more awake and eager to eat.  Physical Exam  BP (!) 114/46   Pulse 75   Temp 98.2 F (36.8 C) (Oral)   Resp 15   Ht 5\' 8"  (1.727 m)   Wt 112.1 kg   SpO2 96%   BMI 37.58 kg/m  Physical Exam  General: Awake, no active CV: Regular rate Resp: Unlabored respirations  ED Course / MDM   73 year old female awaiting transfer to [facility] after presenting with multiple focal seizures after glioblastoma removal on November 2024 at [facility].  Was noted to be thrombocytopenic here.  Hematology, neurology, neurosurgery have all been consulted here.  Patient awake here, eager to eat, do not see any plans for acute surgical intervention.  Will have RN perform swallow screen and allow patient to have diet if this is tolerated.  Somewhat soft blood pressures overnight, will also provide small fluid bolus, but avoid excessive fluid resuscitation, particularly in light of her thrombocytopenia.  Continue plan for transfer.  Plan  Current plan is for transfer to [facility] for continued management.    Trinna Post, MD 08/06/23 [phone]

## 2023-08-06 NOTE — ED Provider Notes (Signed)
-----------------------------------------   6:24 AM on 08/06/2023 -----------------------------------------   No events overnight.  Patient remains in the emergency department pending bed availability at [facility].  Have ordered every 24 hour IV Rocephin.  IV Keppra twice daily previously ordered.  Platelets improved to 28 status post transfusion yesterday afternoon.   Irean Hong, MD 08/06/23 [phone]

## 2023-08-06 NOTE — Progress Notes (Signed)
Hospitalist Transfer Note:     ED TO ED TRANSFER FROM [facility] TO [facility].   Transferring facility: [facility] ED Requesting provider: Dr. Sharyn Creamer (EDP at [facility]) Reason for transfer: admission for further evaluation and management of concern for acute focal seizure.     73 year old female with history of glioblastoma multiform who presented to [facility] ED for evaluation of episode of altered mental status that she was exhibiting evidence of a blank stare as well as some lipsmacking.  She had a similar episode of blank stare with lipsmacking noted at [facility] ED earlier today.   EDP d/w on-call neurology, Dr. Iver Nestle, who is concerned for potential acute focal seizure.  As there are not currently any beds available at [facility], will pursue ED to ED transfer to expedite arrival at [facility]. Dr. Iver Nestle conveys that neurology will formally consult upon the patient's arrival at [facility], noting that Dr. Otelia Limes is on for neurology overnight at [facility].  The patient has been started on Keppra as well as Vimpat.  In the setting of her history of glioblastoma multiform a, she is status post a resection procedure at [facility] and subsequently is followed by [facility] oncology, undergoing chemotherapy.  There are no beds currently available at [facility].      Newton Pigg, DO Hospitalist

## 2023-08-06 NOTE — ED Provider Notes (Signed)
Patient noted about 20 minutes ago to have a little bit of a gagging or lipsmacking like movements.  Family reports she had said her right side of her face felt like a weird tingly or numbing sensation right before it started.  During the episode she has a little bit of a throat clearing sound lipsmacking activity.  She is no longer conversant or verbalizing as she was an hour ago.  I am suspicious that this may represent small focal seizure.  Planning to give Ativan, but by the time we went to go administer it her symptoms have improved now.  Patient is currently alert recognizes her family now she is able to verbalize.  No further seizure-like activity I am suspicious that she had a small focal seizure.  Currently awaiting Vimpat currently receiving Keppra.  Have requested transfer to [facility], neurology Dr. Selina Cooley and Jerrell Belfast have recommended admission to the hospitalist service with neurology consult, if no telemetry bed readily available then they have requested we facilitate ER to ER transfer.  Patient's family agreeable   Sharyn Creamer, MD 08/06/23 [phone]

## 2023-08-06 NOTE — ED Notes (Signed)
Called  [facility]  Transfer  center  spoke  with Valentina Gu  pt  still on  waitlist  for  a  bed  informed  Dr  Fanny Bien  MD  and Hale Drone RN

## 2023-08-06 NOTE — ED Provider Notes (Signed)
[facility] notified as patient remains on wait list at Johny Blamer, MD 08/06/23 [phone]

## 2023-08-06 NOTE — Plan of Care (Signed)
73 year old woman with glioblastoma being treated at [facility]; was planned for transfer to [facility] but has not had an available bed   Presented with new onset seizures, started on Keppra at this admission. Ongoing concerns for episodic seizures; has not returned to baseline  - Transfer to [facility] for LTM EEG. If no bed readily available, ED to ED transfer. Hall bed would not be acceptable, would need a regular ED bed for EEG hookup  - On Keppra 1000 mg BID which is max for her current renal function; continue this   - Start Vimpat 100 BID (EKG reviewed) as a second agent due to ongoing concern for seizure activity  - Check electolytes including Magnesium and treat as needed  - Infectious workup per ED   Brooke Dare MD-PhD [facility] [phone] Available 7 AM to 7 PM, outside these hours please contact Neurologist on call listed on AMION

## 2023-08-06 NOTE — Consult Note (Signed)
[facility]  Telephone:([phone] Fax:([phone]) [phone]  ID: KYNNA EALY OB: 03/03/1951  MR#: [id number]  YQM#:[id number]  Patient Care Team: Lynnea Ferrier, MD as PCP - General (Internal Medicine)  REFERRING PROVIDER: Dr. Larinda Buttery  REASON FOR REFERRAL: Severe thrombocytopenia  ASSESSMENT AND PLAN:   Dana Morton is a 73 y.o. female with pmh of glioblastoma grade 4 status postsurgical resection and completion of concurrent chemo RT on 07/31/2023 presented to [facility] ED on 08/05/2023 with concerns for seizure.  # Severe thrombocytopenia # GBM -On admission, platelets 6000.  Hemolytic panel is negative.  DIC panel negative.  Low suspicion for TMA.  No schistocytes on smear.  -Severe thrombocytopenia could be related to recent Temodar use.  In 20% of the cases, it can cause severe grade 3 and grade 4 thrombocytopenia. -s/p 2 units of platelet transfusion on 1/22.  Repeat platelets improved to 28. -I spoke with the RN and recommend monitoring CBC daily.  Repeat platelet count today.  Order placed. -Continue with supportive care with platelet transfusion less than 10,000 or less than 30,000 with bleeding. -Patient has been accepted at [facility] for transfer of care pending transportation due to bed constraints.  # Seizure -Started on IV Keppra.  CT head and MRI brain with and without contrast no concerns for acute event. -Neurosurgery and teleneurology on board.  Patient expressed understanding and was in agreement with this plan. She also understands that She can call clinic at any time with any questions, concerns, or complaints.   I spent a total of 60 minutes reviewing chart data, face-to-face evaluation with the patient, counseling and coordination of care as detailed above.  HPI: Dana Morton is a 73 y.o. female  with a diagnosis of glioblastoma IDH wild-type, WHO grade 4 status post resection and completion of concurrent temozolomide and RT on 07/31/2023.   She follows with neuro-oncology at [facility] and now looking to transfer care to [facility] neuro-oncology.  Platelets from 07/31/2023 - 114.   Presented to [facility] ED on 1/22 with concerns for seizures.  CT head with no evidence of acute changes.  On lab work, WBC 3.2, hemoglobin 10.5 and platelet of 6000.  PT/INR is normal.  APTT normal.  LDH and haptoglobin normal.  On smear review, no concerns for schistocytes.  MRI brain with and without contrast was motioned aggregated examination significantly limiting evaluation.  T2 flair hyperintense surrounding the resection cavity may reflect gliosis, edema or residual disease.  Repeat MRI recommended.   S/p 2 units platelet transfusion on 08/05/2023.  Repeat platelet count improved to 28.  Patient still on the wait list for transfer to [facility].  REVIEW OF SYSTEMS:   ROS  As per HPI. Otherwise, a complete review of systems is negative.  PAST MEDICAL HISTORY: Past Medical History:  Diagnosis Date   Anemia    h/o    Arthritis    Claustrophobia    Diabetes mellitus without complication (HCC)    Diverticulosis    Fibrocystic breast disease (FCBD)    Fibrocystic disease of breast    GERD (gastroesophageal reflux disease)    Herpes simplex    HLD (hyperlipidemia)    Hypercholesteremia    Hyperlipidemia    Hypertension    Hypothyroidism    Pre-diabetes    Pulmonary nodules    Pure hypercholesterolemia    Rotator cuff tear    LEFT SHOULDER   Sleep apnea    no cpap   Vitamin D deficiency  Vitamin D deficiency     PAST SURGICAL HISTORY: Past Surgical History:  Procedure Laterality Date   ABDOMINAL HYSTERECTOMY     BREAST BIOPSY Left 06/19/2020   stereo biopsy/ x clip/ path pending   BREAST EXCISIONAL BIOPSY Left 70/80s   benign   CHOLECYSTECTOMY     COLONOSCOPY WITH PROPOFOL N/A 03/22/2015   Procedure: COLONOSCOPY WITH PROPOFOL;  Surgeon: Wallace Cullens, MD;  Location: [facility] ENDOSCOPY;  Service: Gastroenterology;  Laterality: N/A;    COLONOSCOPY WITH PROPOFOL N/A 04/19/2021   Procedure: COLONOSCOPY WITH PROPOFOL;  Surgeon: Regis Bill, MD;  Location: [facility] ENDOSCOPY;  Service: Endoscopy;  Laterality: N/A;  AMPICILLIN - PER OFFICE   DG FOOT HEEL ([facility] HX)     DILATION AND CURETTAGE OF UTERUS     ESOPHAGOGASTRODUODENOSCOPY (EGD) WITH PROPOFOL N/A 03/22/2015   Procedure: ESOPHAGOGASTRODUODENOSCOPY (EGD) WITH PROPOFOL;  Surgeon: Wallace Cullens, MD;  Location: [facility] ENDOSCOPY;  Service: Gastroenterology;  Laterality: N/A;   JOINT REPLACEMENT     KNEE ARTHROPLASTY Right 09/23/2017   Procedure: COMPUTER ASSISTED TOTAL KNEE ARTHROPLASTY;  Surgeon: Donato Heinz, MD;  Location: [facility] ORS;  Service: Orthopedics;  Laterality: Right;   KNEE ARTHROSCOPY Right 08/02/2015   Procedure: ARTHROSCOPY KNEE, PARTIAL MEDIAL MENISECTOMY, PLICA INCISION;  Surgeon: Kennedy Bucker, MD;  Location: [facility] ORS;  Service: Orthopedics;  Laterality: Right;   KNEE ARTHROSCOPY      FAMILY HISTORY: Family History  Problem Relation Age of Onset   Breast cancer Neg Hx     HEALTH MAINTENANCE: Social History   Tobacco Use   Smoking status: Never   Smokeless tobacco: Never  Vaping Use   Vaping status: Never Used  Substance Use Topics   Alcohol use: No   Drug use: No     Allergies  Allergen Reactions   Celebrex [Celecoxib] Nausea Only   Codeine Nausea Only    Current Facility-Administered Medications  Medication Dose Route Frequency Provider Last Rate Last Admin   0.9 %  sodium chloride infusion  10 mL/hr Intravenous Once Chesley Noon, MD   Held at 08/05/23 0818   cefTRIAXone (ROCEPHIN) 1 g in sodium chloride 0.9 % 100 mL IVPB  1 g Intravenous Q24H Irean Hong, MD   Stopped at 08/06/23 0847   levETIRAcetam (KEPPRA) IVPB 1000 mg/100 mL premix  1,000 mg Intravenous Domenic Polite, MD   Stopped at 08/06/23 [phone]   Current Outpatient Medications  Medication Sig Dispense Refill   acetaminophen (TYLENOL) 500 MG tablet Take 1,000 mg by  mouth every 6 (six) hours as needed for mild pain (pain score 1-3).     bisoprolol (ZEBETA) 10 MG tablet Take 10 mg by mouth daily.     Calcium Carbonate-Vitamin D (CALCIUM 600+D PO) Take 1 tablet by mouth daily.     cetirizine (ZYRTEC) 10 MG tablet Take 10 mg by mouth daily.     chlorthalidone (HYGROTON) 25 MG tablet Take 25 mg by mouth daily.     Cholecalciferol 25 MCG (1000 UT) tablet Take 1,000 Units by mouth daily.     HYDROcodone-acetaminophen (NORCO) 10-325 MG tablet Take 1-2 tablets by mouth 2 (two) times daily as needed. For severe back pain.  0   levothyroxine (SYNTHROID) 137 MCG tablet Take 137 mcg by mouth daily before breakfast.     lisinopril (PRINIVIL,ZESTRIL) 40 MG tablet Take 40 mg by mouth at bedtime.      lovastatin (MEVACOR) 40 MG tablet Take 40 mg by mouth at bedtime.  Magnesium 500 MG CAPS Take 500 mg by mouth daily.     Multiple Vitamin (MULTIVITAMIN WITH MINERALS) TABS tablet Take 1 tablet by mouth daily. Centrum Silver for Women     omeprazole (PRILOSEC) 20 MG capsule Take 20 mg by mouth daily before breakfast.      levETIRAcetam (KEPPRA) 1000 MG tablet Take 500 mg by mouth every 12 (twelve) hours. (Patient not taking: Reported on 08/06/2023)      OBJECTIVE: Vitals:   08/06/23 1330 08/06/23 1400  BP: (!) 95/48 (!) 108/47  Pulse: 71 69  Resp: (!) 22 16  Temp:    SpO2: 94% 98%     Body mass index is 37.58 kg/m.      General: Well-developed, well-nourished, no acute distress. Eyes: Pink conjunctiva, anicteric sclera. HEENT: Normocephalic, moist mucous membranes, clear oropharnyx. Lungs: Clear to auscultation bilaterally. Heart: Regular rate and rhythm. No rubs, murmurs, or gallops. Abdomen: Soft, nontender, nondistended. No organomegaly noted, normoactive bowel sounds. Musculoskeletal: No edema, cyanosis, or clubbing. Neuro: Alert, answering all questions appropriately. Cranial nerves grossly intact. Skin: No rashes or petechiae noted. Psych: Normal  affect. Lymphatics: No cervical, calvicular, axillary or inguinal LAD.   LAB RESULTS:  Lab Results  Component Value Date   NA 136 08/05/2023   K 4.0 08/05/2023   CL 102 08/05/2023   CO2 23 08/05/2023   GLUCOSE 184 (H) 08/05/2023   BUN 20 08/05/2023   CREATININE 1.13 (H) 08/05/2023   CALCIUM 8.8 (L) 08/05/2023   PROT 7.0 08/05/2023   ALBUMIN 3.2 (L) 08/05/2023   AST 61 (H) 08/05/2023   ALT 36 08/05/2023   ALKPHOS 79 08/05/2023   BILITOT 0.9 08/05/2023   GFRNONAA 51 (L) 08/05/2023   GFRAA >60 09/09/2017    Lab Results  Component Value Date   WBC 3.2 (L) 08/05/2023   NEUTROABS 1.8 08/05/2023   HGB 9.5 (L) 08/05/2023   HCT 27.7 (L) 08/05/2023   MCV 95.8 08/05/2023   PLT 28 (LL) 08/05/2023    No results found for: "TIBC", "FERRITIN", "IRONPCTSAT"   STUDIES: MR Brain W and Wo Contrast Result Date: 08/05/2023 CLINICAL DATA:  Provided history: Brain/CNS neoplasm, assess treatment response. Additional history provided: Seizure, history of glioblastoma status post craniotomy and tumor resection, currently receiving temozolomide and radiation therapy. EXAM: MRI HEAD WITHOUT AND WITH CONTRAST TECHNIQUE: Multiplanar, multiecho pulse sequences of the brain and surrounding structures were obtained without and with intravenous contrast. CONTRAST:  10mL GADAVIST GADOBUTROL 1 MMOL/ML IV SOLN COMPARISON:  Head CT 08/05/2023.  Brain MRI 05/12/2023. FINDINGS: The examination is intermittently motion degraded, significantly limiting evaluation. Most notably, the coronal T2 sequence through the hippocampi is moderate-to-severely motion degraded and the axial and coronal T1-weighted post-contrast sequences are severely motion degraded. Brain: Resection cavity with chronic blood products in the mid and posterior left temporal lobe. T2 FLAIR hyperintense signal abnormality surrounding the resection cavity which may reflect gliosis, edema and/or residual infiltrative tumor. No definite masslike or  nodular enhancement is identified at the resection site, however, motion degradation significantly limits evaluation. Mild multifocal Mild multifocal T2 FLAIR hyperintense signal abnormality elsewhere within the cerebral white matter, nonspecific but compatible chronic small vessel ischemic disease. Chronic lacunar infarcts again noted within the left basal ganglia. There is no acute infarct. No extra-axial fluid collection. No midline shift. Vascular: Maintained flow voids within the proximal large arterial vessels. Skull and upper cervical spine: Left temporoparietal cranioplasty. Sinuses/Orbits: No mass or acute finding within the imaged orbits. Minimal mucosal thickening scattered throughout  the paranasal sinuses. IMPRESSION: 1. Motion degraded examination, significantly limiting evaluation. 2. Prior left temporal lobe tumor resection. T2 FLAIR hyperintense signal abnormality surrounding the resection cavity may reflect gliosis, edema and/or residual infiltrative tumor. No definite masslike or nodular enhancement is identified at the resection site, however, motion degradation significantly limits evaluation. Given the severe motion degradation of multiple post-contrast sequences, a repeat brain MRI is recommended when the patient is better able to tolerate the study. 3. Chronic small vessel ischemic disease as described. Electronically Signed   By: Jackey Loge D.O.   On: 08/05/2023 11:30   DG Chest Port 1 View Result Date: 08/05/2023 CLINICAL DATA:  73 year old female with altered mental status, witnessed seizure. Left cerebral hemisphere tumor by MRI in October. Hypotensive. EXAM: PORTABLE CHEST 1 VIEW COMPARISON:  CTA chest 05/31/2016 and earlier. FINDINGS: Portable AP semi upright view at 0508 hours. Lower lung volumes. Cardiac size now at the upper limits of normal. Other mediastinal contours are within normal limits. Visualized tracheal air column is within normal limits. Bilateral lung base hypo  ventilation but no superimposed pneumothorax, pulmonary edema, consolidation. Paucity of bowel gas. No acute osseous abnormality identified. IMPRESSION: Lower lung volumes with lung base hypo ventilation. No other acute cardiopulmonary abnormality. Electronically Signed   By: Odessa Fleming M.D.   On: 08/05/2023 05:29   CT Head Wo Contrast Result Date: 08/05/2023 CLINICAL DATA:  73 year old female with altered mental status, witnessed seizure. Left cerebral hemisphere tumor by MRI in October. EXAM: CT HEAD WITHOUT CONTRAST TECHNIQUE: Contiguous axial images were obtained from the base of the skull through the vertex without intravenous contrast. RADIATION DOSE REDUCTION: This exam was performed according to the departmental dose-optimization program which includes automated exposure control, adjustment of the mA and/or kV according to patient size and/or use of iterative reconstruction technique. COMPARISON:  Brain MRI and head CT 05/12/2023. FINDINGS: Brain: Posterior left temporal lobe resection cavity now is low-density on series 2, image 15. Regional cerebral edema largely resolved. No significant regional mass effect. Superimposed chronic lacunar infarcts of the left basal ganglia appear stable. No midline shift. No ventriculomegaly. No acute intracranial hemorrhage identified. No cortically based acute infarct identified. Vascular: No suspicious intracranial vascular hyperdensity. Skull: Motion artifact, particularly at the skull base. New left lateral craniotomy. Elsewhere the skull appears intact. Sinuses/Orbits: Visualized paranasal sinuses and mastoids are stable and well aerated. Other: New postoperative changes to the left scalp. Visualized orbit soft tissues are within normal limits. IMPRESSION: 1. Interval left lateral craniotomy and tumor resection from the posterior left temporal lobe. Regional edema and mass effect appear resolved by noncontrast CT, tumor restaging by MRI without and with contrast would  be most sensitive and specific. 2. No new intracranial abnormality identified. 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 Electronically Signed   By: Odessa Fleming M.D.   On: 08/05/2023 05:28    Michaelyn Barter, MD   08/06/2023 2:22 PM

## 2023-08-06 NOTE — Consult Note (Addendum)
NEUROLOGY CONSULT NOTE   Date of service: August 06, 2023 Patient Name: Dana Morton MRN:  [id number] DOB:  10-22-1950 Chief Complaint: New onset seizures Requesting Provider: No att. providers found  History of Present Illness  Dana Morton is a 73 y.o. female with a PMHx of left temporal lobe GBM IDH wild-type, WHO grade 4 s/p resection at [facility] last year, s/p radiation and chemotherapy with temozolamide, anemia, DM, HLD, HTN, hypothyroidism, pre-diabetes, sleep apnea and vitamin D deficiency who initiually presented to the [facility] ED on Wednesday morning with new onset of focal seizure activity. Family stated that the patient went to the restroom, came back to bed and began to have focal seizures manifesting as twitching of the right side of her face. She was also foaming at the mouth and unable to communicate.  EMS gave 5mg  IV Versed en route. Platelets on arrival to the ED were low at 8 and she was therefore given a platelet transfusion.   Teleneurology was consulted who recommended loading with 3000 mg IV Keppra. She was continued on Keppra 1000 mg BID which is max for her current renal function. Transfer for continuous EEG was also recommended. [facility] was not able to accept patient due to shortage of beds, therefore she was transferred to [facility]. While she was waiting for a bed, MRI was obtained revealing expected postoperative changes, no acute findings noted although imaging was limited due to motion artifact.   In the [facility] ED, there was ongoing concern for episodic seizures with postictal state and waxing/waning mentation. Per Dr. Lorenza Chick note from 7:07 PM on Thursday: "Patient noted about 20 minutes ago to have a little bit of a gagging or lipsmacking like movements.  Family reports she had said her right side of her face felt like a weird tingly or numbing sensation right before it started.  During the episode she has a little bit of a throat clearing sound lipsmacking activity.  She is no  longer conversant or verbalizing as she was an hour ago. I am suspicious that this may represent small focal seizure.  Planning to give Ativan, but by the time we went to go administer it her symptoms have improved now. Patient is currently alert recognizes her family now she is able to verbalize.  No further seizure-like activity I am suspicious that she had a small focal seizure.  Currently awaiting Vimpat currently receiving Keppra.  Have requested transfer to [facility]"  Of note, she was on prophylactic Keppra at some point but this was discontinued as patient did not have any seizures. She follows with neuro-oncology at [facility] and now looking to transfer care to [facility] neuro-oncology.   Upon arrival to the [facility] ED, she is alert and oriented to self, but disoriented to time, place, and situation.      ROS  Unable to ascertain due to AMS.  Past History   Past Medical History:  Diagnosis Date   Anemia    h/o    Arthritis    Claustrophobia    Diabetes mellitus without complication (HCC)    Diverticulosis    Fibrocystic breast disease (FCBD)    Fibrocystic disease of breast    GERD (gastroesophageal reflux disease)    Herpes simplex    HLD (hyperlipidemia)    Hypercholesteremia    Hyperlipidemia    Hypertension    Hypothyroidism    Pre-diabetes    Pulmonary nodules    Pure hypercholesterolemia    Rotator cuff tear  LEFT SHOULDER   Sleep apnea    no cpap   Vitamin D deficiency    Vitamin D deficiency     Past Surgical History:  Procedure Laterality Date   ABDOMINAL HYSTERECTOMY     BREAST BIOPSY Left 06/19/2020   stereo biopsy/ x clip/ path pending   BREAST EXCISIONAL BIOPSY Left 70/80s   benign   CHOLECYSTECTOMY     COLONOSCOPY WITH PROPOFOL N/A 03/22/2015   Procedure: COLONOSCOPY WITH PROPOFOL;  Surgeon: Wallace Cullens, MD;  Location: [facility] ENDOSCOPY;  Service: Gastroenterology;  Laterality: N/A;   COLONOSCOPY WITH PROPOFOL N/A 04/19/2021   Procedure: COLONOSCOPY  WITH PROPOFOL;  Surgeon: Regis Bill, MD;  Location: [facility] ENDOSCOPY;  Service: Endoscopy;  Laterality: N/A;  AMPICILLIN - PER OFFICE   DG FOOT HEEL ([facility] HX)     DILATION AND CURETTAGE OF UTERUS     ESOPHAGOGASTRODUODENOSCOPY (EGD) WITH PROPOFOL N/A 03/22/2015   Procedure: ESOPHAGOGASTRODUODENOSCOPY (EGD) WITH PROPOFOL;  Surgeon: Wallace Cullens, MD;  Location: [facility] ENDOSCOPY;  Service: Gastroenterology;  Laterality: N/A;   JOINT REPLACEMENT     KNEE ARTHROPLASTY Right 09/23/2017   Procedure: COMPUTER ASSISTED TOTAL KNEE ARTHROPLASTY;  Surgeon: Donato Heinz, MD;  Location: [facility] ORS;  Service: Orthopedics;  Laterality: Right;   KNEE ARTHROSCOPY Right 08/02/2015   Procedure: ARTHROSCOPY KNEE, PARTIAL MEDIAL MENISECTOMY, PLICA INCISION;  Surgeon: Kennedy Bucker, MD;  Location: [facility] ORS;  Service: Orthopedics;  Laterality: Right;   KNEE ARTHROSCOPY      Family History: Family History  Problem Relation Age of Onset   Breast cancer Neg Hx     Social History  reports that she has never smoked. She has never used smokeless tobacco. She reports that she does not drink alcohol and does not use drugs.  Allergies  Allergen Reactions   Celebrex [Celecoxib] Nausea Only   Codeine Nausea Only    Medications   Current Facility-Administered Medications:    [START ON 08/07/2023] lacosamide (VIMPAT) 100 mg in sodium chloride 0.9 % 25 mL IVPB, 100 mg, Intravenous, Q12H, Caryl Pina, MD   Melene Muller ON 08/07/2023] levETIRAcetam (KEPPRA) IVPB 1000 mg/100 mL premix, 1,000 mg, Intravenous, Q12H, Caryl Pina, MD  Current Outpatient Medications:    acetaminophen (TYLENOL) 500 MG tablet, Take 1,000 mg by mouth every 6 (six) hours as needed for mild pain (pain score 1-3)., Disp: , Rfl:    bisoprolol (ZEBETA) 10 MG tablet, Take 10 mg by mouth daily., Disp: , Rfl:    Calcium Carbonate-Vitamin D (CALCIUM 600+D PO), Take 1 tablet by mouth daily., Disp: , Rfl:    cetirizine (ZYRTEC) 10 MG tablet, Take 10 mg by  mouth daily., Disp: , Rfl:    chlorthalidone (HYGROTON) 25 MG tablet, Take 25 mg by mouth daily., Disp: , Rfl:    Cholecalciferol 25 MCG (1000 UT) tablet, Take 1,000 Units by mouth daily., Disp: , Rfl:    HYDROcodone-acetaminophen (NORCO) 10-325 MG tablet, Take 1-2 tablets by mouth 2 (two) times daily as needed. For severe back pain., Disp: , Rfl: 0   levETIRAcetam (KEPPRA) 1000 MG tablet, Take 500 mg by mouth every 12 (twelve) hours. (Patient not taking: Reported on 08/06/2023), Disp: , Rfl:    levothyroxine (SYNTHROID) 137 MCG tablet, Take 137 mcg by mouth daily before breakfast., Disp: , Rfl:    lisinopril (PRINIVIL,ZESTRIL) 40 MG tablet, Take 40 mg by mouth at bedtime. , Disp: , Rfl:    lovastatin (MEVACOR) 40 MG tablet, Take 40 mg by mouth at bedtime., Disp: ,  Rfl:    Magnesium 500 MG CAPS, Take 500 mg by mouth daily., Disp: , Rfl:    Multiple Vitamin (MULTIVITAMIN WITH MINERALS) TABS tablet, Take 1 tablet by mouth daily. Centrum Silver for Women, Disp: , Rfl:    omeprazole (PRILOSEC) 20 MG capsule, Take 20 mg by mouth daily before breakfast. , Disp: , Rfl:   Facility-Administered Medications Ordered in Other Encounters:    [START ON 08/07/2023] levETIRAcetam (KEPPRA) IVPB 1000 mg/100 mL premix, 1,000 mg, Intravenous, Q12H, Caryl Pina, MD  Vitals   Vitals:   08/06/23 2150 08/06/23 2155  BP:  (!) 124/43  Pulse:  71  Resp:  (!) 24  Temp:  98.5 F (36.9 C)  TempSrc:  Oral  SpO2: 100% 100%    There is no height or weight on file to calculate BMI.  Physical Exam   Physical Exam  HEENT-  [place]/AT    Lungs- Respirations unlabored Extremities- No edema   Neurological Examination Mental Status: Patient's language is sporadic. Initially was having a full conversation with the Hospitalist, but shortly before arrival of Neurology to her room, she started perseverating, saying the same words over and over again in conjunction with intermittent nonsensical/gibberish speech. She can follow  some simple commands, but not others. Poor attention. Does not answer any orientation questions except for perseverative one-word replies. Speech is currently non-fluent. She was not able to name objects but could count fingers.  Cranial Nerves: II: Not cooperative with assessment of visual fields to confrontation or blink-to-threat. PERRL  III,IV, VI: No ptosis. EOMI as she tracks examiner to left and right.  V: Unable to formally assess due to poor attention and confusion VII: Smile symmetric VIII: Hearing intact to voice IX,X: No hoarseness XI: Symmetric XII: Midline tongue extension Motor: BUE maximum strength elicitable is 4/5 proximally and distally without asymmetry. No drift.  BLE: Does not cooperate with requests to elevate legs; does withdraw equally to noxious stimuli.  Sensory: Reacts to gross touch and noxious x 4 Deep Tendon Reflexes: 2+ and symmetric bilateral brachioradialis and patellae.  Cerebellar: Unable to assess due to confusion  Gait: Deferred  Labs/Imaging/Neurodiagnostic studies   CBC:  Recent Labs  Lab 2023-08-16 1525 08/06/23 1654  WBC 3.2* 2.8*  NEUTROABS 1.8 1.5*  HGB 9.5* 9.2*  HCT 27.7* 27.7*  MCV 95.8 100.0  PLT 28* 17*   Basic Metabolic Panel:  Lab Results  Component Value Date   NA 136 2023-08-16   K 4.0 August 16, 2023   CO2 23 08-16-2023   GLUCOSE 184 (H) August 16, 2023   BUN 20 08-16-2023   CREATININE 1.13 (H) 16-Aug-2023   CALCIUM 8.8 (L) 2023/08/16   GFRNONAA 51 (L) 16-Aug-2023   GFRAA >60 09/09/2017   Lipid Panel: No results found for: "LDLCALC" HgbA1c:  Lab Results  Component Value Date   HGBA1C 6.5 (H) 09/09/2017   Urine Drug Screen: No results found for: "LABOPIA", "COCAINSCRNUR", "LABBENZ", "AMPHETMU", "THCU", "LABBARB"  Alcohol Level     Component Value Date/Time   ETH <10 05/12/2023 1347   INR  Lab Results  Component Value Date   INR 1.0 2023-08-16   APTT  Lab Results  Component Value Date   APTT 26 08/16/23      ASSESSMENT  73 y.o. female with a PMHx of left temporal lobe GBM IDH wild-type, WHO grade 4 s/p resection at [facility] last year, s/p radiation and chemotherapy with temozolamide, anemia, DM, HLD, HTN, hypothyroidism, pre-diabetes, sleep apnea and vitamin D deficiency who initiually presented to  the [facility] ED on Wednesday morning with new onset of focal seizure activity. Family stated that the patient went to the restroom, came back to bed and began to have focal seizures manifesting as twitching of the right side of her face. She was also foaming at the mouth and unable to communicate. Due to recurrent seizures in the [facility] ED and waxing/waning mentation, she has been transferred to [facility] for LTM EEG.  - On exam, patient's language is sporadic. Initially was having a full conversation with the Hospitalist, but 5 minutes later was saying the same words over and over again or gibberish speech. With her waxing/waning mentation, the patient is thought most likely to be having intermittent focal seizures.  - CT head:  Interval left lateral craniotomy and tumor resection from the posterior left temporal lobe. Regional edema and mass effect appear resolved by noncontrast CT. No new intracranial abnormality identified. 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 - MRI brain w/wo contrast: Motion degraded examination, significantly limiting evaluation. Prior left temporal lobe tumor resection. T2 FLAIR hyperintense signal abnormality surrounding the resection cavity may reflect gliosis, edema and/or residual infiltrative tumor. No definite masslike or nodular enhancement is identified at the resection site, however, motion degradation significantly limits evaluation. Given the severe motion degradation of multiple post-contrast sequences, a repeat brain MRI is recommended when the patient is better able to tolerate the study. Chronic small vessel ischemic disease as described. - Received a total of 4000 mg IV  Keppra load while at [facility] followed by scheduled dosing at 1000 mg IV BID. - For recurrence of seizure activity, she was given one dose of Vimpat 100 mg IV this evening while still at [facility]. She is being continued on Vimpat here at 100 mg BID - Overall impression:  - New onset seizure activity in a patient with left temporal lobe GBM s/p recent resection in November, s/p radiation therapy and chemotherapy with Temodar.  - Recurrent seizures during initial ED stay with waxing/waning mentation, most consistent with status epilepticus manifesting as clinically evident as well as subclinical seizure activity. Her mentation continues to wax and wane after transfer to [facility]  RECOMMENDATIONS  - STAT LTM EEG (ordered) - Continue Keppra at 1000 mg IV BID (ordered) - Continue Vimpat at 100 mg IV BID (ordered) - Inpatient seizure precautions.  - Outpatient seizure precautions - IV Ativan 2 mg PRN clinical seizure recurrence. Also notify Neurology.   Addendum: - LTM EEG reviewed: Diffuse slowing that waxes and wanes. Occasional sharp waves. A short run of rhythmic-appearing sharp waves is seen in the left sided leads at about 6:50 AM that then resolves, possibly representing a brief electrographic seizure.  - Increasing Vimpat to 200 mg IV BID.  ______________________________________________________________________    Dessa Phi, Talayia Hjort, MD [facility]

## 2023-08-07 ENCOUNTER — Encounter (HOSPITAL_COMMUNITY): Payer: Self-pay | Admitting: Family Medicine

## 2023-08-07 ENCOUNTER — Emergency Department (HOSPITAL_COMMUNITY): Payer: Medicare Other

## 2023-08-07 DIAGNOSIS — I10 Essential (primary) hypertension: Secondary | ICD-10-CM

## 2023-08-07 DIAGNOSIS — Z800 Family history of malignant neoplasm of digestive organs: Secondary | ICD-10-CM | POA: Diagnosis not present

## 2023-08-07 DIAGNOSIS — R4701 Aphasia: Secondary | ICD-10-CM | POA: Diagnosis not present

## 2023-08-07 DIAGNOSIS — K219 Gastro-esophageal reflux disease without esophagitis: Secondary | ICD-10-CM | POA: Diagnosis present

## 2023-08-07 DIAGNOSIS — Z96651 Presence of right artificial knee joint: Secondary | ICD-10-CM | POA: Diagnosis present

## 2023-08-07 DIAGNOSIS — E039 Hypothyroidism, unspecified: Secondary | ICD-10-CM | POA: Diagnosis present

## 2023-08-07 DIAGNOSIS — R569 Unspecified convulsions: Principal | ICD-10-CM

## 2023-08-07 DIAGNOSIS — N3000 Acute cystitis without hematuria: Secondary | ICD-10-CM | POA: Diagnosis present

## 2023-08-07 DIAGNOSIS — Z886 Allergy status to analgesic agent status: Secondary | ICD-10-CM | POA: Diagnosis not present

## 2023-08-07 DIAGNOSIS — Z923 Personal history of irradiation: Secondary | ICD-10-CM | POA: Diagnosis not present

## 2023-08-07 DIAGNOSIS — C719 Malignant neoplasm of brain, unspecified: Secondary | ICD-10-CM | POA: Diagnosis not present

## 2023-08-07 DIAGNOSIS — D6959 Other secondary thrombocytopenia: Secondary | ICD-10-CM | POA: Diagnosis present

## 2023-08-07 DIAGNOSIS — Z79899 Other long term (current) drug therapy: Secondary | ICD-10-CM | POA: Diagnosis not present

## 2023-08-07 DIAGNOSIS — T451X5A Adverse effect of antineoplastic and immunosuppressive drugs, initial encounter: Secondary | ICD-10-CM | POA: Diagnosis present

## 2023-08-07 DIAGNOSIS — Z6837 Body mass index (BMI) 37.0-37.9, adult: Secondary | ICD-10-CM | POA: Diagnosis not present

## 2023-08-07 DIAGNOSIS — F40240 Claustrophobia: Secondary | ICD-10-CM | POA: Diagnosis present

## 2023-08-07 DIAGNOSIS — G40911 Epilepsy, unspecified, intractable, with status epilepticus: Secondary | ICD-10-CM | POA: Diagnosis present

## 2023-08-07 DIAGNOSIS — E876 Hypokalemia: Secondary | ICD-10-CM | POA: Diagnosis present

## 2023-08-07 DIAGNOSIS — G40909 Epilepsy, unspecified, not intractable, without status epilepticus: Secondary | ICD-10-CM

## 2023-08-07 DIAGNOSIS — E119 Type 2 diabetes mellitus without complications: Secondary | ICD-10-CM | POA: Diagnosis present

## 2023-08-07 DIAGNOSIS — D701 Agranulocytosis secondary to cancer chemotherapy: Secondary | ICD-10-CM | POA: Diagnosis present

## 2023-08-07 DIAGNOSIS — D61810 Antineoplastic chemotherapy induced pancytopenia: Secondary | ICD-10-CM | POA: Diagnosis present

## 2023-08-07 DIAGNOSIS — E7800 Pure hypercholesterolemia, unspecified: Secondary | ICD-10-CM

## 2023-08-07 DIAGNOSIS — D696 Thrombocytopenia, unspecified: Secondary | ICD-10-CM

## 2023-08-07 DIAGNOSIS — B9620 Unspecified Escherichia coli [E. coli] as the cause of diseases classified elsewhere: Secondary | ICD-10-CM | POA: Diagnosis present

## 2023-08-07 DIAGNOSIS — R040 Epistaxis: Secondary | ICD-10-CM | POA: Diagnosis not present

## 2023-08-07 DIAGNOSIS — D6481 Anemia due to antineoplastic chemotherapy: Secondary | ICD-10-CM | POA: Diagnosis present

## 2023-08-07 LAB — CBC WITH DIFFERENTIAL/PLATELET
Abs Immature Granulocytes: 0.01 10*3/uL (ref 0.00–0.07)
Abs Immature Granulocytes: 0.01 10*3/uL (ref 0.00–0.07)
Basophils Absolute: 0 10*3/uL (ref 0.0–0.1)
Basophils Absolute: 0 10*3/uL (ref 0.0–0.1)
Basophils Relative: 0 %
Basophils Relative: 0 %
Eosinophils Absolute: 0.2 10*3/uL (ref 0.0–0.5)
Eosinophils Absolute: 0.1 10*3/uL (ref 0.0–0.5)
Eosinophils Relative: 6 %
Eosinophils Relative: 3 %
HCT: 27.2 % — ABNORMAL LOW (ref 36.0–46.0)
HCT: 26 % — ABNORMAL LOW (ref 36.0–46.0)
Hemoglobin: 9.2 g/dL — ABNORMAL LOW (ref 12.0–15.0)
Hemoglobin: 8.8 g/dL — ABNORMAL LOW (ref 12.0–15.0)
Immature Granulocytes: 0 %
Immature Granulocytes: 0 %
Lymphocytes Relative: 32 %
Lymphocytes Relative: 33 %
Lymphs Abs: 1 10*3/uL (ref 0.7–4.0)
Lymphs Abs: 0.9 10*3/uL (ref 0.7–4.0)
MCH: 33.1 pg (ref 26.0–34.0)
MCH: 32.8 pg (ref 26.0–34.0)
MCHC: 33.8 g/dL (ref 30.0–36.0)
MCHC: 33.8 g/dL (ref 30.0–36.0)
MCV: 97.8 fL (ref 80.0–100.0)
MCV: 97 fL (ref 80.0–100.0)
Monocytes Absolute: 0.1 10*3/uL (ref 0.1–1.0)
Monocytes Absolute: 0.1 10*3/uL (ref 0.1–1.0)
Monocytes Relative: 3 %
Monocytes Relative: 3 %
Neutro Abs: 1.7 10*3/uL (ref 1.7–7.7)
Neutro Abs: 1.7 10*3/uL (ref 1.7–7.7)
Neutrophils Relative %: 59 %
Neutrophils Relative %: 61 %
Platelets: 14 10*3/uL — CL (ref 150–400)
Platelets: 11 10*3/uL — CL (ref 150–400)
RBC: 2.78 MIL/uL — ABNORMAL LOW (ref 3.87–5.11)
RBC: 2.68 MIL/uL — ABNORMAL LOW (ref 3.87–5.11)
RDW: 13.7 % (ref 11.5–15.5)
RDW: 13.7 % (ref 11.5–15.5)
Smear Review: DECREASED
WBC: 3 10*3/uL — ABNORMAL LOW (ref 4.0–10.5)
WBC: 2.8 10*3/uL — ABNORMAL LOW (ref 4.0–10.5)
nRBC: 0 % (ref 0.0–0.2)
nRBC: 0 % (ref 0.0–0.2)

## 2023-08-07 LAB — COMPREHENSIVE METABOLIC PANEL
ALT: 29 U/L (ref 0–44)
AST: 40 U/L (ref 15–41)
Albumin: 2.6 g/dL — ABNORMAL LOW (ref 3.5–5.0)
Alkaline Phosphatase: 66 U/L (ref 38–126)
Anion gap: 9 (ref 5–15)
BUN: 15 mg/dL (ref 8–23)
CO2: 26 mmol/L (ref 22–32)
Calcium: 8.1 mg/dL — ABNORMAL LOW (ref 8.9–10.3)
Chloride: 105 mmol/L (ref 98–111)
Creatinine, Ser: 0.93 mg/dL (ref 0.44–1.00)
GFR, Estimated: >60 mL/min (ref >60)
Glucose, Bld: 119 mg/dL — ABNORMAL HIGH (ref 70–99)
Potassium: 3.5 mmol/L (ref 3.5–5.1)
Sodium: 140 mmol/L (ref 135–145)
Total Bilirubin: 0.8 mg/dL (ref 0.0–1.2)
Total Protein: 6.2 g/dL — ABNORMAL LOW (ref 6.5–8.1)

## 2023-08-07 LAB — CBG MONITORING, ED
Glucose-Capillary: 112 mg/dL — ABNORMAL HIGH (ref 70–99)
Glucose-Capillary: 119 mg/dL — ABNORMAL HIGH (ref 70–99)
Glucose-Capillary: 107 mg/dL — ABNORMAL HIGH (ref 70–99)
Glucose-Capillary: 159 mg/dL — ABNORMAL HIGH (ref 70–99)
Glucose-Capillary: 125 mg/dL — ABNORMAL HIGH (ref 70–99)

## 2023-08-07 LAB — IMMATURE PLATELET FRACTION: Immature Platelet Fraction: 1.4 % (ref 1.2–8.6)

## 2023-08-07 LAB — VITAMIN B12: Vitamin B-12: 570 pg/mL (ref 180–914)

## 2023-08-07 LAB — MAGNESIUM
Magnesium: 1.2 mg/dL — ABNORMAL LOW (ref 1.7–2.4)
Magnesium: 1.8 mg/dL (ref 1.7–2.4)

## 2023-08-07 LAB — URINE CULTURE: Culture: >=100000 — AB

## 2023-08-07 LAB — T4, FREE: Free T4: 0.66 ng/dL (ref 0.61–1.12)

## 2023-08-07 LAB — FOLATE: Folate: 25.5 ng/mL (ref >5.9)

## 2023-08-07 LAB — HEMOGLOBIN A1C
Hgb A1c MFr Bld: 6.7 % — ABNORMAL HIGH (ref 4.8–5.6)
Mean Plasma Glucose: 145.59 mg/dL

## 2023-08-07 LAB — TSH: TSH: 11.112 u[IU]/mL — ABNORMAL HIGH (ref 0.350–4.500)

## 2023-08-07 MED ORDER — LORAZEPAM 2 MG/ML IJ SOLN: 2.0000 mg | Freq: Once | INTRAMUSCULAR | Status: DC | PRN

## 2023-08-07 MED ORDER — SODIUM CHLORIDE 0.9 % IV SOLN
200.0000 mg | Freq: Two times a day (BID) | INTRAVENOUS | Status: DC
  Administered 2023-08-07 – 2023-08-10 (×7): 200 mg via INTRAVENOUS
  Filled 2023-08-07 (×9): qty 20

## 2023-08-07 MED ORDER — LACTATED RINGERS IV SOLN
INTRAVENOUS | Status: DC
Start: 2023-08-07 — End: 2023-08-08

## 2023-08-07 MED ORDER — LORAZEPAM 2 MG/ML IJ SOLN
2.0000 mg | INTRAMUSCULAR | Status: DC | PRN
  Filled 2023-08-07 (×2): qty 1

## 2023-08-07 MED ORDER — BISOPROLOL FUMARATE 10 MG PO TABS
10.0000 mg | ORAL_TABLET | Freq: Every day | ORAL | Status: DC
  Filled 2023-08-07: qty 1

## 2023-08-07 MED ORDER — PRAVASTATIN SODIUM 40 MG PO TABS: 40.0000 mg | ORAL_TABLET | Freq: Every day | ORAL | Status: DC

## 2023-08-07 MED ORDER — LEVOTHYROXINE SODIUM 25 MCG PO TABS
137.0000 ug | ORAL_TABLET | Freq: Every day | ORAL | Status: DC
  Administered 2023-08-07: 137 ug via ORAL
  Filled 2023-08-07: qty 1

## 2023-08-07 MED ORDER — PANTOPRAZOLE SODIUM 40 MG PO TBEC: 40.0000 mg | DELAYED_RELEASE_TABLET | Freq: Every day | ORAL | Status: DC

## 2023-08-07 MED ORDER — MAGNESIUM SULFATE 4 GM/100ML IV SOLN
4.0000 g | Freq: Once | INTRAVENOUS | Status: AC
  Administered 2023-08-07: 4 g via INTRAVENOUS
  Filled 2023-08-07: qty 100

## 2023-08-07 MED ORDER — LEVETIRACETAM IN NACL 500 MG/100ML IV SOLN
500.0000 mg | Freq: Once | INTRAVENOUS | Status: AC
  Administered 2023-08-07: 500 mg via INTRAVENOUS
  Filled 2023-08-07: qty 100

## 2023-08-07 MED ORDER — POTASSIUM CHLORIDE 2 MEQ/ML IV SOLN
INTRAVENOUS | Status: DC
  Filled 2023-08-07: qty 1000

## 2023-08-07 MED ORDER — SODIUM CHLORIDE 0.9 % IV SOLN
1.0000 g | INTRAVENOUS | Status: DC
Start: 2023-08-07 — End: 2023-08-09
  Administered 2023-08-07 – 2023-08-09 (×3): 1 g via INTRAVENOUS
  Filled 2023-08-07 (×3): qty 10

## 2023-08-07 MED ORDER — LISINOPRIL 20 MG PO TABS: 40.0000 mg | ORAL_TABLET | Freq: Every day | ORAL | Status: DC

## 2023-08-07 MED ORDER — METOPROLOL TARTRATE 5 MG/5ML IV SOLN: 5.0000 mg | INTRAVENOUS | Status: DC | PRN

## 2023-08-07 MED ORDER — INSULIN ASPART 100 UNIT/ML IJ SOLN: 0.0000 [IU] | Freq: Every day | INTRAMUSCULAR | Status: DC

## 2023-08-07 MED ORDER — LEVOTHYROXINE SODIUM 100 MCG/5ML IV SOLN: 68.5000 ug | Freq: Once | INTRAVENOUS | Status: DC

## 2023-08-07 MED ORDER — LEVOTHYROXINE SODIUM 25 MCG PO TABS: 137.0000 ug | ORAL_TABLET | Freq: Every day | ORAL | Status: DC

## 2023-08-07 MED ORDER — HEPARIN SODIUM (PORCINE) 5000 UNIT/ML IJ SOLN: 5000.0000 [IU] | Freq: Three times a day (TID) | INTRAMUSCULAR | Status: DC

## 2023-08-07 MED ORDER — OXYMETAZOLINE HCL 0.05 % NA SOLN: 1.0000 | NASAL | Status: DC | PRN

## 2023-08-07 MED ORDER — PANTOPRAZOLE SODIUM 40 MG IV SOLR
40.0000 mg | INTRAVENOUS | Status: DC
  Administered 2023-08-07 – 2023-08-10 (×4): 40 mg via INTRAVENOUS
  Filled 2023-08-07 (×4): qty 10

## 2023-08-07 MED ORDER — SODIUM CHLORIDE 0.9% IV SOLUTION: Freq: Once | INTRAVENOUS | Status: AC

## 2023-08-07 MED ORDER — PERAMPANEL 8 MG PO TABS
8.0000 mg | ORAL_TABLET | Freq: Once | ORAL | Status: AC
Start: 2023-08-07 — End: 2023-08-07
  Administered 2023-08-07: 8 mg via ORAL
  Filled 2023-08-07: qty 1

## 2023-08-07 MED ORDER — ONDANSETRON HCL 4 MG PO TABS: 4.0000 mg | ORAL_TABLET | Freq: Four times a day (QID) | ORAL | Status: DC | PRN

## 2023-08-07 MED ORDER — LEVETIRACETAM IN NACL 1500 MG/100ML IV SOLN
1500.0000 mg | Freq: Two times a day (BID) | INTRAVENOUS | Status: DC
Start: 2023-08-07 — End: 2023-08-10
  Administered 2023-08-07 – 2023-08-10 (×6): 1500 mg via INTRAVENOUS
  Filled 2023-08-07 (×7): qty 100

## 2023-08-07 MED ORDER — VITAMIN B-12 1000 MCG PO TABS
1000.0000 ug | ORAL_TABLET | Freq: Every day | ORAL | Status: DC
  Administered 2023-08-07 – 2023-08-12 (×6): 1000 ug via ORAL
  Filled 2023-08-07 (×6): qty 1

## 2023-08-07 MED ORDER — INSULIN ASPART 100 UNIT/ML IJ SOLN: 0.0000 [IU] | INTRAMUSCULAR | Status: DC

## 2023-08-07 MED ORDER — LEVOTHYROXINE SODIUM 75 MCG PO TABS
150.0000 ug | ORAL_TABLET | Freq: Every day | ORAL | Status: DC
Start: 2023-08-08 — End: 2023-08-13
  Administered 2023-08-08 – 2023-08-12 (×5): 150 ug via ORAL
  Filled 2023-08-07 (×5): qty 2

## 2023-08-07 MED ORDER — INSULIN ASPART 100 UNIT/ML IJ SOLN
0.0000 [IU] | INTRAMUSCULAR | Status: DC
Start: 2023-08-07 — End: 2023-08-07
  Administered 2023-08-07: 2 [IU] via SUBCUTANEOUS

## 2023-08-07 MED ORDER — ACETAMINOPHEN 650 MG RE SUPP
650.0000 mg | Freq: Four times a day (QID) | RECTAL | Status: DC | PRN
  Administered 2023-08-07: 650 mg via RECTAL
  Filled 2023-08-07: qty 1

## 2023-08-07 MED ORDER — ACETAMINOPHEN 325 MG PO TABS
650.0000 mg | ORAL_TABLET | Freq: Four times a day (QID) | ORAL | Status: DC | PRN
Start: 2023-08-07 — End: 2023-08-13
  Administered 2023-08-08: 650 mg via ORAL
  Filled 2023-08-07 (×2): qty 2

## 2023-08-07 MED ORDER — SODIUM CHLORIDE 0.9 % IV SOLN: 2.0000 g | INTRAVENOUS | Status: DC

## 2023-08-07 MED ORDER — ONDANSETRON HCL 4 MG/2ML IJ SOLN: 4.0000 mg | Freq: Four times a day (QID) | INTRAMUSCULAR | Status: DC | PRN

## 2023-08-07 MED ORDER — CHLORHEXIDINE GLUCONATE CLOTH 2 % EX PADS
6.0000 | MEDICATED_PAD | Freq: Every day | CUTANEOUS | Status: DC
  Administered 2023-08-08 – 2023-08-12 (×4): 6 via TOPICAL

## 2023-08-07 MED ORDER — INSULIN ASPART 100 UNIT/ML IJ SOLN
0.0000 [IU] | Freq: Three times a day (TID) | INTRAMUSCULAR | Status: DC
  Administered 2023-08-07 – 2023-08-08 (×2): 1 [IU] via SUBCUTANEOUS
  Administered 2023-08-08: 2 [IU] via SUBCUTANEOUS
  Administered 2023-08-09 (×2): 1 [IU] via SUBCUTANEOUS
  Administered 2023-08-10: 5 [IU] via SUBCUTANEOUS
  Administered 2023-08-10: 2 [IU] via SUBCUTANEOUS
  Administered 2023-08-11: 1 [IU] via SUBCUTANEOUS
  Administered 2023-08-11: 2 [IU] via SUBCUTANEOUS
  Administered 2023-08-11 – 2023-08-12 (×2): 1 [IU] via SUBCUTANEOUS
  Administered 2023-08-12 (×2): 2 [IU] via SUBCUTANEOUS

## 2023-08-07 NOTE — ED Notes (Signed)
The pts nose has continued to bleed intermittently  she has coughed up a clot once tonight

## 2023-08-07 NOTE — Progress Notes (Addendum)
[facility] night cross cover note:   I followed up on this evening's CBC result, noting platelet count of 11,000 following transfusion of 1 unit of platelets this afternoon.  Platelet count on the morning of 08/07/2023 was 14,000.  Heme-onc was consulted during the day, and felt that the patient's thrombocytopenia was on the basis of chemotherapy, as opposed to DIC, and recommended prn platelet transfusion for goal platelet count of 40,000-50,000.   I subsequently ordered 2 additional units of platelets to be transfused, along for repeat CBC to be checked after completion of this transfusion.   Update: post-platelet transfusion cbc result is pending at this time.    Newton Pigg, DO Hospitalist

## 2023-08-07 NOTE — Consult Note (Cosign Needed)
[facility] CONSULT NOTE  Patient Care Team: Lynnea Ferrier, MD as PCP - General (Internal Medicine)  CHIEF COMPLAINTS/PURPOSE OF CONSULTATION:  Thrombocytopenia.  History of GBM  REFERRING PHYSICIAN: Dr. Allena Katz  HISTORY OF PRESENTING ILLNESS:  Dana Morton 73 y.o. female who was brought to the ED due to intractable seizures.  Patient was originally taken to [facility] ER and transferred to [facility] ER earlier this morning 08/07/2023.  Patient's daughter states she was brought to [facility] because there were no available beds at [facility] where all her doctors are. Apparently on 08/05/2023 patient was noted by family to be foaming at the mouth and unable to communicate.  Patient was found to have low platelets and a UTI for which she has been receiving treatment.  Oncology consult has now been requested due to low platelets and history of glioblastoma with recent brain surgery at [facility]. Patient is now seen awake and alert laying supine in bed in the ED.  She is ill-appearing.  Her spouse and daughter are both at bedside.  EEG is currently in progress.  Patient is noted with active nosebleed.   Platelets are also infusing well at this time.  Daughter states patient has had 9 seizures since admission.  Most of history is obtained from patient's daughter and husband although patient is verbally responsive.  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  She had surgery at [facility] and is status post 3 weeks of radiation at [facility] which was completed last Friday 07/31/2023 per her daughter. Medical history also significant for anemia, diabetes, hypertension and hyperlipidemia. Surgical history significant for brain surgery due to GBM as stated above.  Also includes cholecystectomy and right knee surgery.  She had excisional breast biopsy in the 70s or 80s and again in 2021, apparently both benign. Family history includes a brother with prostate  and colorectal cancer and a niece with aggressive breast cancer. Social history is noncontributory; denies tobacco use, denies alcohol use denies illicit or recreational drug use.     I have reviewed her chart and materials related to her cancer extensively and collaborated history with the patient. Summary of oncologic history is as follows: Oncology History   No history exists.    ASSESSMENT & PLAN:   1.  Thrombocytopenia - Low platelets 14K today.  It is noted patient with baseline platelets in 200s 2 months ago. - Low counts may be due to Temodar/medication that patient has recently been prescribed. - Transfuse platelets for counts <20K or <50K with active bleeding. - Immature platelet fraction 1.4%.  Recommend continue close observation of CBC with differential.  Platelets should improve spontaneously. - Monitor closely for active bleeding, nosebleeding noted at this time. - Hematology/oncology/Dr. Pamelia Hoit following closely  2.  Glioblastoma - Patient with recent diagnosis in November 2024.  She is status post brain surgery at [facility].  Status post 3 weeks of radiation at [facility] which was just completed last Friday 07/31/2023. - CT of head done 08/05/2023 shows interval left lateral craniotomy and tumor resection from the posterior left temporal lobe. - MRI of head done 08/05/2023 shows prior left temporal tumor resection.  Hyperintense signal abnormality surrounding the resection cavity may reflect gliosis, edema, and/or residual infiltrative tumor.  No definite mass or nodular enhancement identified at the resection site.  Motion degradation significantly limits evaluation. -Repeat MRI recommended when patient is better able to tolerate the study. - Continue follow-up with oncologists  at [facility] and [place] as instructed.  3.  Seizures, persistent recurrent - No apparent history of seizures. - Family states she has had 9 seizures since admission. - Possibly due to to glioblastoma. - MRI  done 08/05/2023 results outlined above. -EEG in progress at this time - Neurology following closely  4.  Anemia, normocytic - Hemoglobin 9.2 today.  Baseline hemoglobin appears to be in the 14 range. - Likely due to to malignancy, recent surgery, medications - Recommend transfuse PRBC for Hgb <7.0. - No transfusional requirements at this time. - Continue to monitor CBC with differential closely  Orders Placed This Encounter  Procedures   Critical Care    This order was created via procedure documentation    Standing Status:   Standing    Number of Occurrences:   1   CBC with Differential    Standing Status:   Standing    Number of Occurrences:   1   Basic metabolic panel    Standing Status:   Standing    Number of Occurrences:   1   Hemoglobin A1c    To assess prior glycemic control    Standing Status:   Standing    Number of Occurrences:   1   Comprehensive metabolic panel    Standing Status:   Standing    Number of Occurrences:   1   Magnesium    Standing Status:   Standing    Number of Occurrences:   1   CBC with Differential/Platelet    Standing Status:   Standing    Number of Occurrences:   1   TSH    Standing Status:   Standing    Number of Occurrences:   1   T4, free    Standing Status:   Standing    Number of Occurrences:   1   Immature Platelet Fraction    Standing Status:   Standing    Number of Occurrences:   1   Vitamin B12    Standing Status:   Standing    Number of Occurrences:   1   Folate    Standing Status:   Standing    Number of Occurrences:   1   DIET DYS 3 Room service appropriate? Yes; Fluid consistency: Thin    Standing Status:   Standing    Number of Occurrences:   1    Room service appropriate?:   Yes    Fluid consistency::   Thin   Apply Diabetes Mellitus Care Plan    Standing Status:   Standing    Number of Occurrences:   1   Cardiac Monitoring - Continuous Indefinite    Standing Status:   Standing    Number of Occurrences:   1     Indications for use::   ICU/Stepdown patient   Vital signs    Standing Status:   Standing    Number of Occurrences:   1   Notify physician (specify)    Standing Status:   Standing    Number of Occurrences:   20    Notify Physician:   for pulse less than 55 or greater than 120    Notify Physician:   for respiratory rate less than 12 or greater than 25    Notify Physician:   for temperature greater than 100.5 F    Notify Physician:   for urinary output less than 30 mL/hr for four hours    Notify Physician:   for  systolic BP less than 90 or greater than 160, diastolic BP less than 60 or greater than 100    Notify Physician:   for new hypoxia w/ oxygen saturations < 88%   Mobility Protocol: No Restrictions RN to initiate protocols based on patient's level of care    RN to initiate protocols based on patient's level of care    Standing Status:   Standing    Number of Occurrences:   1   Refer to Sidebar Report Refer to ICU, Med-Surg, Progressive, and Step-Down Mobility Protocol Sidebars    Refer to ICU, Med-Surg, Progressive, and Step-Down Mobility Protocol Sidebars    Standing Status:   Standing    Number of Occurrences:   1   Initiate Adult Central Line Maintenance and Catheter Protocol for patients with central line (CVC, PICC, Port, Hemodialysis, Trialysis)    Standing Status:   Standing    Number of Occurrences:   1   Do not place and if present remove PureWick    Standing Status:   Standing    Number of Occurrences:   1   Initiate Oral Care Protocol    Standing Status:   Standing    Number of Occurrences:   1   Initiate Carrier Fluid Protocol    Standing Status:   Standing    Number of Occurrences:   1   RN may order General Admission PRN Orders utilizing "General Admission PRN medications" (through manage orders) for the following patient needs: allergy symptoms (Claritin), cold sores (Carmex), cough (Robitussin DM), eye irritation (Liquifilm Tears), hemorrhoids (Tucks),  indigestion (Maalox), minor skin irritation (Hydrocortisone Cream), muscle pain (Ben Gay), nose irritation (saline nasal spray) and sore throat (Chloraseptic spray).    Standing Status:   Standing    Number of Occurrences:   [phone]   Patient has an active order for admit to inpatient/place in observation    Standing Status:   Standing    Number of Occurrences:   1   Place and maintain sequential compression device    Standing Status:   Standing    Number of Occurrences:   1   Apply Diabetes Mellitus Care Plan    Standing Status:   Standing    Number of Occurrences:   1   STAT CBG when hypoglycemia is suspected. If treated, recheck every 15 minutes after each treatment until CBG >/= 70 mg/dl    Standing Status:   Standing    Number of Occurrences:   1   Refer to Hypoglycemia Protocol Sidebar Report for treatment of CBG < 70 mg/dl    Standing Status:   Standing    Number of Occurrences:   1   Full code    Standing Status:   Standing    Number of Occurrences:   1    By::   Consent: discussion documented in EHR   Consult to neurology    Standing Status:   Standing    Number of Occurrences:   1    Place call to::   oncall Neurology    Reason for Consult:   Admit   Consult for Unassigned Medical Admission    Standing Status:   Standing    Number of Occurrences:   1    Place call to::   on call for unassigned    Reason for Consult:   Admit   Oxygen therapy Mode or (Route): Nasal cannula; Liters Per Minute: 2; Keep O2 saturation between: greater than 92 %  Standing Status:   Standing    Number of Occurrences:   1    Mode or (Route):   Nasal cannula    Liters Per Minute:   2    Keep O2 saturation between:   greater than 92 %   SLP eval and treat Reason for evaluation: .Swallowing evaluation (BSE, MBS and/or diet order as indicated)    Standing Status:   Standing    Number of Occurrences:   1    Reason for evaluation:   .Swallowing evaluation (BSE, MBS and/or diet order as indicated)    CBG monitoring, ED    Standing Status:   Standing    Number of Occurrences:   1   CBG monitoring, ED    Standing Status:   Standing    Number of Occurrences:   1   CBG monitoring, ED    Standing Status:   Standing    Number of Occurrences:   1   Type and screen [facility]    [facility]     Standing Status:   Standing    Number of Occurrences:   1   Overnight EEG with video    Standing Status:   Standing    Number of Occurrences:   1    Reason for exam:   Status epilepticus   Admit to Inpatient (patient's expected length of stay will be greater than 2 midnights or inpatient only procedure)    Standing Status:   Standing    Number of Occurrences:   1    Hospital Area:   [facility] [[id number]]    Level of Care:   Progressive [102]    Admit to Progressive based on following criteria:   NEUROLOGICAL AND NEUROSURGICAL complex patients with significant risk of instability, who do not meet ICU criteria, yet require close observation or frequent assessment (< / = every 2 - 4 hours) with medical / nursing intervention.    May admit patient to [facility] or [facility] if equivalent level of care is available::   No    Covid Evaluation:   Asymptomatic - no recent exposure (last 10 days) testing not required    Diagnosis:   Seizure (HCC) [205090]    Admitting Physician:   Gery Pray [[id number]]    Attending Physician:   Gery Pray [[id number]]    Certification::   I certify this patient will need inpatient services for at least 2 midnights    Expected Medical Readiness:   08/09/2023   Seizure precautions    Standing Status:   Standing    Number of Occurrences:   1     MEDICAL HISTORY:  Past Medical History:  Diagnosis Date   Anemia    h/o    Arthritis    Claustrophobia    Diabetes mellitus without complication (HCC)    Diverticulosis    Fibrocystic breast disease (FCBD)    Fibrocystic disease of breast    GERD (gastroesophageal  reflux disease)    Herpes simplex    HLD (hyperlipidemia)    Hypercholesteremia    Hyperlipidemia    Hypertension    Hypothyroidism    Pre-diabetes    Pulmonary nodules    Pure hypercholesterolemia    Rotator cuff tear    LEFT SHOULDER   Sleep apnea    no cpap   Vitamin D deficiency    Vitamin D deficiency     SURGICAL HISTORY: Past Surgical History:  Procedure Laterality Date   ABDOMINAL HYSTERECTOMY     BREAST BIOPSY Left 06/19/2020   stereo biopsy/ x clip/ path pending   BREAST EXCISIONAL BIOPSY Left 70/80s   benign   CHOLECYSTECTOMY     COLONOSCOPY WITH PROPOFOL N/A 03/22/2015   Procedure: COLONOSCOPY WITH PROPOFOL;  Surgeon: Wallace Cullens, MD;  Location: [facility] ENDOSCOPY;  Service: Gastroenterology;  Laterality: N/A;   COLONOSCOPY WITH PROPOFOL N/A 04/19/2021   Procedure: COLONOSCOPY WITH PROPOFOL;  Surgeon: Regis Bill, MD;  Location: [facility] ENDOSCOPY;  Service: Endoscopy;  Laterality: N/A;  AMPICILLIN - PER OFFICE   DG FOOT HEEL ([facility] HX)     DILATION AND CURETTAGE OF UTERUS     ESOPHAGOGASTRODUODENOSCOPY (EGD) WITH PROPOFOL N/A 03/22/2015   Procedure: ESOPHAGOGASTRODUODENOSCOPY (EGD) WITH PROPOFOL;  Surgeon: Wallace Cullens, MD;  Location: [facility] ENDOSCOPY;  Service: Gastroenterology;  Laterality: N/A;   JOINT REPLACEMENT     KNEE ARTHROPLASTY Right 09/23/2017   Procedure: COMPUTER ASSISTED TOTAL KNEE ARTHROPLASTY;  Surgeon: Donato Heinz, MD;  Location: [facility] ORS;  Service: Orthopedics;  Laterality: Right;   KNEE ARTHROSCOPY Right 08/02/2015   Procedure: ARTHROSCOPY KNEE, PARTIAL MEDIAL MENISECTOMY, PLICA INCISION;  Surgeon: Kennedy Bucker, MD;  Location: [facility] ORS;  Service: Orthopedics;  Laterality: Right;   KNEE ARTHROSCOPY      SOCIAL HISTORY: Social History   Socioeconomic History   Marital status: Married    Spouse name: Not on file   Number of children: Not on file   Years of education: Not on file   Highest education level: Not on file  Occupational History    Not on file  Tobacco Use   Smoking status: Never   Smokeless tobacco: Never  Vaping Use   Vaping status: Never Used  Substance and Sexual Activity   Alcohol use: No   Drug use: No   Sexual activity: Not on file  Other Topics Concern   Not on file  Social History Narrative   ** Merged History Encounter **       Social Drivers of Health   Financial Resource Strain: Patient Declined (06/23/2023)   Received from [facility]   Overall Financial Resource Strain (CARDIA)    Difficulty of Paying Living Expenses: Patient declined  Food Insecurity: Patient Declined (06/23/2023)   Received from [facility]   Hunger Vital Sign    Worried About Running Out of Food in the Last Year: Patient declined    Ran Out of Food in the Last Year: Patient declined  Transportation Needs: No Transportation Needs (05/30/2023)   Received from [facility] - Transportation    In the past 12 months, has lack of transportation kept you from medical appointments or from getting medications?: No    Lack of Transportation (Non-Medical): No  Physical Activity: Not on file  Stress: Not on file  Social Connections: Not on file  Intimate Partner Violence: Not on file    FAMILY HISTORY: Family History  Problem Relation Age of Onset   Breast cancer Neg Hx     REVIEW OF SYSTEMS:   Constitutional: +Fatigue +weakness +ongoing seizures Eyes: Denies blurriness of vision, double vision or watery eyes Ears, nose, mouth, throat, and face: Denies mucositis or sore throat Respiratory: Denies cough, dyspnea or wheezes Cardiovascular: Denies palpitation, chest discomfort or lower extremity swelling Gastrointestinal: Denies nausea, heartburn or change in bowel habits Skin: +Multiple ecchymotic areas over bilateral upper extremities.  Lymphatics: Denies new lymphadenopathy or  easy bruising Neurological: +Seizures +neuropathy bilateral lower  extremities Behavioral/Psych: Mood is stable, no new changes  All other systems were reviewed with the patient and are negative.  PHYSICAL EXAMINATION: ECOG PERFORMANCE STATUS: 3 - Symptomatic, >50% confined to bed  Vitals:   08/07/23 1005 08/07/23 1024  BP:  (!) 125/54  Pulse:  79  Resp:  (!) 25  Temp: 98.9 F (37.2 C)   SpO2:  98%   There were no vitals filed for this visit.  GENERAL: alert, +ill-appearing +active nosebleed SKIN: +Pale skin color, texture, turgor are normal, no rashes or significant lesions EYES: normal, conjunctiva are pink and non-injected, sclera clear OROPHARYNX: no exudate, no erythema and lips, buccal mucosa, and tongue normal  NECK: supple, thyroid normal size, non-tender, without nodularity LYMPH: no palpable lymphadenopathy in the cervical, axillary or inguinal LUNGS: clear to auscultation and percussion with normal breathing effort HEART: regular rate & rhythm and no murmurs and no lower extremity edema ABDOMEN: abdomen soft, non-tender and normal bowel sounds MUSCULOSKELETAL: no cyanosis of digits and no clubbing  PSYCH: alert & oriented x 3 with fluent speech NEURO: no focal motor/sensory deficits   ALLERGIES:  is allergic to celebrex [celecoxib] and codeine.  MEDICATIONS:  Current Facility-Administered Medications  Medication Dose Route Frequency Provider Last Rate Last Admin   acetaminophen (TYLENOL) tablet 650 mg  650 mg Oral Q6H PRN Crosley, Debby, MD       Or   acetaminophen (TYLENOL) suppository 650 mg  650 mg Rectal Q6H PRN Crosley, Debby, MD       cefTRIAXone (ROCEPHIN) 1 g in sodium chloride 0.9 % 100 mL IVPB  1 g Intravenous Q24H Rolly Salter, MD   Stopped at 08/07/23 1035   cyanocobalamin (VITAMIN B12) tablet 1,000 mcg  1,000 mcg Oral Daily Rolly Salter, MD   1,000 mcg at 08/07/23 1039   insulin aspart (novoLOG) injection 0-9 Units  0-9 Units Subcutaneous Q4H Rolly Salter, MD       lacosamide (VIMPAT) 200 mg in sodium  chloride 0.9 % 25 mL IVPB  200 mg Intravenous Q12H Caryl Pina, MD   Stopped at 08/07/23 0901   lactated ringers infusion   Intravenous Continuous Rolly Salter, MD 75 mL/hr at 08/07/23 1004 New Bag at 08/07/23 1004   levETIRAcetam (KEPPRA) IVPB 1500 mg/ 100 mL premix  1,500 mg Intravenous Q12H Charlsie Quest, MD       levothyroxine (SYNTHROID) tablet 137 mcg  137 mcg Oral Q0600 Rolly Salter, MD   137 mcg at 08/07/23 1000   LORazepam (ATIVAN) injection 2 mg  2 mg Intravenous Q4H PRN Rolly Salter, MD       metoprolol tartrate (LOPRESSOR) injection 5 mg  5 mg Intravenous Q4H PRN Crosley, Debby, MD       ondansetron (ZOFRAN) tablet 4 mg  4 mg Oral Q6H PRN Crosley, Debby, MD       Or   ondansetron (ZOFRAN) injection 4 mg  4 mg Intravenous Q6H PRN Crosley, Debby, MD       pantoprazole (PROTONIX) injection 40 mg  40 mg Intravenous Q24H Crosley, Debby, MD   40 mg at 08/07/23 0981   Current Outpatient Medications  Medication Sig Dispense Refill   acetaminophen (TYLENOL) 500 MG tablet Take 1,000 mg by mouth every 6 (six) hours as needed for mild pain (pain score 1-3).     bisoprolol (ZEBETA) 10 MG tablet Take 10 mg by mouth daily.     Calcium  Carbonate-Vitamin D (CALCIUM 600+D PO) Take 1 tablet by mouth daily.     cetirizine (ZYRTEC) 10 MG tablet Take 10 mg by mouth daily.     chlorthalidone (HYGROTON) 25 MG tablet Take 25 mg by mouth daily.     Cholecalciferol 25 MCG (1000 UT) tablet Take 1,000 Units by mouth daily.     HYDROcodone-acetaminophen (NORCO) 10-325 MG tablet Take 1-2 tablets by mouth 2 (two) times daily as needed. For severe back pain.  0   levETIRAcetam (KEPPRA) 1000 MG tablet Take 500 mg by mouth every 12 (twelve) hours. (Patient not taking: Reported on 08/06/2023)     levothyroxine (SYNTHROID) 137 MCG tablet Take 137 mcg by mouth daily before breakfast.     lisinopril (PRINIVIL,ZESTRIL) 40 MG tablet Take 40 mg by mouth at bedtime.      lovastatin (MEVACOR) 40 MG tablet Take  40 mg by mouth at bedtime.     Magnesium 500 MG CAPS Take 500 mg by mouth daily.     Multiple Vitamin (MULTIVITAMIN WITH MINERALS) TABS tablet Take 1 tablet by mouth daily. Centrum Silver for Women     omeprazole (PRILOSEC) 20 MG capsule Take 20 mg by mouth daily before breakfast.        LABORATORY DATA:  I have reviewed the data as listed Lab Results  Component Value Date   WBC 3.0 (L) 08/07/2023   HGB 9.2 (L) 08/07/2023   HCT 27.2 (L) 08/07/2023   MCV 97.8 08/07/2023   PLT 14 (LL) 08/07/2023   Recent Labs    05/12/23 1347 08/05/23 0505 08/06/23 2242 08/07/23 0524  NA 136 136 140 140  K 3.9 4.0 3.1* 3.5  CL 99 102 110 105  CO2 28 23 23 26   GLUCOSE 118* 184* 112* 119*  BUN 14 20 15 15   CREATININE 1.18* 1.13* 0.89 0.93  CALCIUM 9.2 8.8* 7.2* 8.1*  GFRNONAA 48* 51* >60 >60  PROT 7.5 7.0  --  6.2*  ALBUMIN 3.9 3.2*  --  2.6*  AST 38 61*  --  40  ALT 20 36  --  29  ALKPHOS 51 79  --  66  BILITOT 0.8 0.9  --  0.8    RADIOGRAPHIC STUDIES: I have personally reviewed the radiological images as listed and agreed with the findings in the report. DG Chest Portable 1 View Result Date: 08/06/2023 CLINICAL DATA:  change in resp pattern, eval for aspiration. ? seizure EXAM: PORTABLE CHEST 1 VIEW COMPARISON:  08/05/2023 FINDINGS: Heart is borderline in size. Mediastinal contours within normal limits. Low lung volumes. Bibasilar opacities, favor atelectasis. No effusions or acute bony abnormality. IMPRESSION: Low lung volumes with bibasilar atelectasis. Electronically Signed   By: Charlett Nose M.D.   On: 08/06/2023 19:12   MR Brain W and Wo Contrast Result Date: 08/05/2023 CLINICAL DATA:  Provided history: Brain/CNS neoplasm, assess treatment response. Additional history provided: Seizure, history of glioblastoma status post craniotomy and tumor resection, currently receiving temozolomide and radiation therapy. EXAM: MRI HEAD WITHOUT AND WITH CONTRAST TECHNIQUE: Multiplanar, multiecho  pulse sequences of the brain and surrounding structures were obtained without and with intravenous contrast. CONTRAST:  10mL GADAVIST GADOBUTROL 1 MMOL/ML IV SOLN COMPARISON:  Head CT 08/05/2023.  Brain MRI 05/12/2023. FINDINGS: The examination is intermittently motion degraded, significantly limiting evaluation. Most notably, the coronal T2 sequence through the hippocampi is moderate-to-severely motion degraded and the axial and coronal T1-weighted post-contrast sequences are severely motion degraded. Brain: Resection cavity with chronic blood products in the  mid and posterior left temporal lobe. T2 FLAIR hyperintense signal abnormality surrounding the resection cavity which may reflect gliosis, edema and/or residual infiltrative tumor. No definite masslike or nodular enhancement is identified at the resection site, however, motion degradation significantly limits evaluation. Mild multifocal Mild multifocal T2 FLAIR hyperintense signal abnormality elsewhere within the cerebral white matter, nonspecific but compatible chronic small vessel ischemic disease. Chronic lacunar infarcts again noted within the left basal ganglia. There is no acute infarct. No extra-axial fluid collection. No midline shift. Vascular: Maintained flow voids within the proximal large arterial vessels. Skull and upper cervical spine: Left temporoparietal cranioplasty. Sinuses/Orbits: No mass or acute finding within the imaged orbits. Minimal mucosal thickening scattered throughout the paranasal sinuses. IMPRESSION: 1. Motion degraded examination, significantly limiting evaluation. 2. Prior left temporal lobe tumor resection. T2 FLAIR hyperintense signal abnormality surrounding the resection cavity may reflect gliosis, edema and/or residual infiltrative tumor. No definite masslike or nodular enhancement is identified at the resection site, however, motion degradation significantly limits evaluation. Given the severe motion degradation of  multiple post-contrast sequences, a repeat brain MRI is recommended when the patient is better able to tolerate the study. 3. Chronic small vessel ischemic disease as described. Electronically Signed   By: Jackey Loge D.O.   On: 08/05/2023 11:30   DG Chest Port 1 View Result Date: 08/05/2023 CLINICAL DATA:  73 year old female with altered mental status, witnessed seizure. Left cerebral hemisphere tumor by MRI in October. Hypotensive. EXAM: PORTABLE CHEST 1 VIEW COMPARISON:  CTA chest 05/31/2016 and earlier. FINDINGS: Portable AP semi upright view at 0508 hours. Lower lung volumes. Cardiac size now at the upper limits of normal. Other mediastinal contours are within normal limits. Visualized tracheal air column is within normal limits. Bilateral lung base hypo ventilation but no superimposed pneumothorax, pulmonary edema, consolidation. Paucity of bowel gas. No acute osseous abnormality identified. IMPRESSION: Lower lung volumes with lung base hypo ventilation. No other acute cardiopulmonary abnormality. Electronically Signed   By: Odessa Fleming M.D.   On: 08/05/2023 05:29   CT Head Wo Contrast Result Date: 08/05/2023 CLINICAL DATA:  73 year old female with altered mental status, witnessed seizure. Left cerebral hemisphere tumor by MRI in October. EXAM: CT HEAD WITHOUT CONTRAST TECHNIQUE: Contiguous axial images were obtained from the base of the skull through the vertex without intravenous contrast. RADIATION DOSE REDUCTION: This exam was performed according to the departmental dose-optimization program which includes automated exposure control, adjustment of the mA and/or kV according to patient size and/or use of iterative reconstruction technique. COMPARISON:  Brain MRI and head CT 05/12/2023. FINDINGS: Brain: Posterior left temporal lobe resection cavity now is low-density on series 2, image 15. Regional cerebral edema largely resolved. No significant regional mass effect. Superimposed chronic lacunar infarcts  of the left basal ganglia appear stable. No midline shift. No ventriculomegaly. No acute intracranial hemorrhage identified. No cortically based acute infarct identified. Vascular: No suspicious intracranial vascular hyperdensity. Skull: Motion artifact, particularly at the skull base. New left lateral craniotomy. Elsewhere the skull appears intact. Sinuses/Orbits: Visualized paranasal sinuses and mastoids are stable and well aerated. Other: New postoperative changes to the left scalp. Visualized orbit soft tissues are within normal limits. IMPRESSION: 1. Interval left lateral craniotomy and tumor resection from the posterior left temporal lobe. Regional edema and mass effect appear resolved by noncontrast CT, tumor restaging by MRI without and with contrast would be most sensitive and specific. 2. No new intracranial abnormality identified. 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 Electronically Signed  By: Odessa Fleming M.D.   On: 08/05/2023 05:28     The total time spent in the appointment was 55 minutes encounter with patients including review of chart and various tests results, discussions about plan of care and coordination of care plan   All questions were answered. The patient knows to call the clinic with any problems, questions or concerns. No barriers to learning was detected.  Dawson Bills, NP 1/24/202511:54 AM   Attending Note  I personally saw the patient, reviewed the chart and examined the patient. The plan of care was discussed with the patient and the admitting team. I agree with the assessment and plan as documented above. Thank you very much for the consultation. Severe pancytopenia: Most likely related to recent Temodar along with radiation.  I discussed with the patient and her family that it might take several weeks for the pancytopenia to improve.  In the interim patient will need at least 3 times a week blood work and transfusion support as needed. Thrombocytopenia:  Transfuse platelets if they are below 20 given nosebleeds.  Immature platelet fraction of 1.4 suggest decreased production.  We discussed the role of Nplate.  Dr. Allena Katz showed me a recent study of using Nplate in Temodar treated patients.  We will watch for the time being. Anemia: Transfuse PRBC if hemoglobin is below 8.  I will give her a dose of Procrit today.  This is because of anemia due to antineoplastic therapy. Leukopenia: ANC 1.0.  If it drops tomorrow we might have to consider G-CSF injections to keep ANC greater than 1.  Will follow along.

## 2023-08-07 NOTE — Consult Note (Signed)
[facility] Consult Services  Stat Consult  Patient Name:   Dana Morton, Dana Morton Date of Birth:   [birth year] Identification Number:   MRN - [id number] Date of Service:   08/05/2023 05:29:59  Diagnosis:       G40.89 - Other seizures  Impression Focal status  I would recommend additional 3 g of Keppra for total loading dose of likely 4 g  Patient should be transferred emergently to higher level of care for continuous EEG monitoring  She will also need an MRI of the brain with and without contrast   Recommendations: Our recommendations are outlined below.  Diagnostic Studies : MRI brain w/wo contrast Continuous EEG Monitoring  Laboratory Studies : Comprehensive Metabolic profile CBC with diff Lactic acid ESR/CRP magnesium, phosphorus AED levels  Medications : 3 g of Keppra  Nursing Recommendations : Maintain Euglycemia and Euthermia Neuro checks q1-2 hrs during ICU stay if critical Once stable neuro checks q 4hrs  Seizure precautions : Seizure precautions including no driving for state mandated time frame were discussed with patient with clear understanding  DVT Prophylaxis : SCDs, Pneumatic Compression  Disposition : Neurology will sign off. Reconsult if Needed.    ----------------------------------------------------------------------------------------------------   Advanced Imaging: Advanced Imaging Deferred because:  Stroke not suspected with clinical presentation and exam    Metrics: Dispatch Time: 08/05/2023 05:25:44 Callback Response Time: 08/05/2023 05:30:25  Primary Provider Notified of Diagnostic Impression and Management Plan on: 08/05/2023 06:04:40   CT HEAD: Reviewed Noncontrast CT of the head does reveal a left lateral craniotomy  There are some edema and mass effect that appears to be resolved when compared to prior noncontrast CT imaging and there is no new  abnormalities    ----------------------------------------------------------------------------------------------------  Chief Complaint: SEIZURES  History of Present Illness: Patient is a 73 year old Female. STAT consult confirmed by DR Deliah Boston -ES 23 F Presents with multiple seizure received 5mg  of Versed GCS 8 Imaging: [facility]- completed Would like evaluation and recommendations  Patient still remains altered  There is some concern for possibly focal status  Patient has a history of prior resection of GBM this was resected in November 2024 left-sided  Reportedly she has a history of seizures not sure if she is still on antiepileptic agents however there is no family with her at this time      Past Medical History:      There is no history of Hypertension unable to obtain due to:   Patient Is Confused  Medications:  No Anticoagulant use  No Antiplatelet use Reviewed EMR for current medications  Allergies:   Allergies Unable To Obtain Due To: Patient Is Confused  Social History: Unable To Obtain Due To Patient Status : Patient Is Confused  Family History:  Family History Cannot Be Obtained Because:Patient Is Confused  ROS : ROS Cannot Be Obtained Because:  Patient Is Confused  Past Surgical History: Past Surgical History Cannot Be Obtained Because: Patient Is Confused There Is No Surgical History Contributory To Today's Visit    Examination: BP(113/53), Pulse(65), Blood Glucose(159)  Neuro Exam: General: Alert,Awake  Speech: Fluent:  Language: Intact:  Face: Symmetric:  Facial Sensation: Intact:  Visual Fields: Intact:  Extraocular Movements: Intact:  Motor Exam: No Drift:  Sensation: Intact:  Coordination: Intact:  Spoke with : DR JADE SONG    This consult was conducted in real time using interactive audio and Immunologist. Patient was informed of the technology being used for this visit and agreed to proceed. Patient located  in  hospital and provider located at home/office setting.  Patient is being evaluated for possible acute neurologic impairment and high probability of imminent or life - threatening deterioration.I spent total of 35 minutes providing care to this patient, including time for face to face visit via telemedicine, review of medical records, imaging studies and discussion of findings with providers, the patient and / or family.   Dr Glena Norfolk   [facility] For Inpatient follow-up with [facility] physician please call [facility] at [phone]. As we are not an outpatient service for any post hospital discharge needs please contact the hospital for assistance.  If you have any questions for the [facility] physicians or need to reconsult for clinical or diagnostic changes please contact us via [facility] at [phone].

## 2023-08-07 NOTE — Progress Notes (Signed)
Pt has not yet moved to floor, check back to see later

## 2023-08-07 NOTE — Procedures (Incomplete)
Patient Name: Dana Morton  MRN: [id number]  Epilepsy Attending: Charlsie Quest  Referring Physician/Provider: Caryl Pina, MD  Duration: 08/07/2023 0304 to 08/08/2023 0304  Patient history: 73 y.o. female with a PMHx of left temporal lobe GBM IDH wild-type, WHO grade 4 s/p resection at [facility] last year, s/p radiation and chemotherapy with temozolamide who initiually presented with new onset of focal seizure activity. EEG to evaluate for seizure  Level of alertness: awake, asleep   AEDs during EEG study: LEV, LCM, Perampanel  Technical aspects: This EEG study was done with scalp electrodes positioned according to the 10-20 International system of electrode placement. Electrical activity was reviewed with band pass filter of 1-70Hz , sensitivity of 7 uV/mm, display speed of 17mm/sec with a 60Hz  notched filter applied as appropriate. EEG data were recorded continuously and digitally stored.  Video monitoring was available and reviewed as appropriate.  Description: The posterior dominant rhythm consists of 8 Hz activity of moderate voltage (25-35 uV) seen predominantly in posterior head regions, asymmetric ( left<right) and reactive to eye opening and eye closing. Sleep was characterized by vertex waves, sleep spindles (12 to 14 Hz), maximal frontocentral region. Hyperventilation and photic stimulation were not performed.  EEG also showed continuous 3 to 6 Hz theta-delta slowing in left hemisphere.   Seizure without clinical sign was noted arising from left hemisphere. During seizure, EEG showed lateralized periodic discharges in left hemisphere at 1 to 1.5 Hz which evolved to 2.5 to 3 Hz with overriding rhythmicity admixed with 5 to 6 Hz theta slowing.  EEG then evolved into sharply contoured 2 to 3 Hz delta slowing.  Average 2 seizures were noted per hour, lasting about 2 minutes on average. Gradually as anti-seizure medications were adjusted, seizures resolved and EEG showed lateralized periodic  discharges with overriding fast activity in left hemisphere maximal left temporal region at 1 to 1.5 Hz which appeared rhythmic at times, lasting 5-9 seconds. This EEG pattern is consistent with brief ictal-interictal rhythmic discharges  (BIRDs). EEG continued to improve and gradually after around 2130 on 08/07/2023, BIRDs resolved. EEG then showed lateralized periodic discharges with overriding fast activity in left hemisphere maximal left temporal region at 1 to 1.5 Hz.   ABNORMALITY - Seizure without clinical signs, left hemisphere - Brief ictal-interictal rhythmic discharges, left hemisphere - Lateralized periodic discharges with overriding fast activity ( LPD +F ) left hemisphere - Continuous slow, left hemisphere  IMPRESSION: This study initially showed seizures without clinical signs arising from left hemisphere, average 2 seizures/hour, lasting about 2 minutes on average. Gradually as anti-seizure medications were adjusted, seizures resolved. Subsequently EEG showed evidence of epileptogenicity arising from left hemisphere which is on the ictal-interictal continuum with increased risk of seizure recurrence. Lastly there was cortical dysfunction in left hemisphere likely secondary to underlying structural abnormality and seizures.  [name] [name]

## 2023-08-07 NOTE — ED Notes (Signed)
CCMD notified. Pt is on monitor.

## 2023-08-07 NOTE — Progress Notes (Signed)
LTM EEG hooked up and running - no initial skin breakdown - push button tested - [facility] is not monitoring while in er.

## 2023-08-07 NOTE — H&P (Signed)
PCP:   Lynnea Ferrier, MD   Chief Complaint:  Focal seizures  HPI: This is a 73 year old female with recent diagnosis of glioblastoma diagnosed 05/2023.  It was excised at [facility].  She underwent 3 weeks of radiation at [facility], completed last Friday.  The last 2 days patient has been getting IV fluids infusion as he was noted to have increased creatinine of 1.6.  That is normalized.    On the 22nd patient's son was called by his dad and told he was foaming at the mouth, unable to communicate.  She states [facility] ER thought to be having intractable seizures.  She was also found to have a UTI, started on IV Rocephin.  Cultures has grown E. coli, sensitivities pending.  Patient with significant thrombocytopenia 8.  Patient transfused PRBCs platelets 28.  Attempts were made to transfer patient to [facility] no beds were available.  Patient transferred to [facility] for continuous EEG.  While at [facility] patient's language was sporadic.  At point having full conversation, 5 minutes later saying the same words over and over again or gibberish speech.  Patient thought to be having intermittent focal seizures. History provided by patient's son is present at bedside.  At [facility], ER, patient seen by neurohospitalist.  Continuous EEG recommended.  IV Keppra and IV Vimpat ordered.  Review of Systems:  Per HPI  Past Medical History: Past Medical History:  Diagnosis Date   Anemia    h/o    Arthritis    Claustrophobia    Diabetes mellitus without complication (HCC)    Diverticulosis    Fibrocystic breast disease (FCBD)    Fibrocystic disease of breast    GERD (gastroesophageal reflux disease)    Herpes simplex    HLD (hyperlipidemia)    Hypercholesteremia    Hyperlipidemia    Hypertension    Hypothyroidism    Pre-diabetes    Pulmonary nodules    Pure hypercholesterolemia    Rotator cuff tear    LEFT SHOULDER   Sleep apnea    no cpap   Vitamin D deficiency    Vitamin D deficiency     Past Surgical History:  Procedure Laterality Date   ABDOMINAL HYSTERECTOMY     BREAST BIOPSY Left 06/19/2020   stereo biopsy/ x clip/ path pending   BREAST EXCISIONAL BIOPSY Left 70/80s   benign   CHOLECYSTECTOMY     COLONOSCOPY WITH PROPOFOL N/A 03/22/2015   Procedure: COLONOSCOPY WITH PROPOFOL;  Surgeon: Wallace Cullens, MD;  Location: [facility] ENDOSCOPY;  Service: Gastroenterology;  Laterality: N/A;   COLONOSCOPY WITH PROPOFOL N/A 04/19/2021   Procedure: COLONOSCOPY WITH PROPOFOL;  Surgeon: Regis Bill, MD;  Location: [facility] ENDOSCOPY;  Service: Endoscopy;  Laterality: N/A;  AMPICILLIN - PER OFFICE   DG FOOT HEEL ([facility] HX)     DILATION AND CURETTAGE OF UTERUS     ESOPHAGOGASTRODUODENOSCOPY (EGD) WITH PROPOFOL N/A 03/22/2015   Procedure: ESOPHAGOGASTRODUODENOSCOPY (EGD) WITH PROPOFOL;  Surgeon: Wallace Cullens, MD;  Location: [facility] ENDOSCOPY;  Service: Gastroenterology;  Laterality: N/A;   JOINT REPLACEMENT     KNEE ARTHROPLASTY Right 09/23/2017   Procedure: COMPUTER ASSISTED TOTAL KNEE ARTHROPLASTY;  Surgeon: Donato Heinz, MD;  Location: [facility] ORS;  Service: Orthopedics;  Laterality: Right;   KNEE ARTHROSCOPY Right 08/02/2015   Procedure: ARTHROSCOPY KNEE, PARTIAL MEDIAL MENISECTOMY, PLICA INCISION;  Surgeon: Kennedy Bucker, MD;  Location: [facility] ORS;  Service: Orthopedics;  Laterality: Right;   KNEE ARTHROSCOPY  Medications: Prior to Admission medications   Medication Sig Start Date End Date Taking? Authorizing Provider  acetaminophen (TYLENOL) 500 MG tablet Take 1,000 mg by mouth every 6 (six) hours as needed for mild pain (pain score 1-3).    [provider]  bisoprolol (ZEBETA) 10 MG tablet Take 10 mg by mouth daily.    [provider]  Calcium Carbonate-Vitamin D (CALCIUM 600+D PO) Take 1 tablet by mouth daily.    [provider]  cetirizine (ZYRTEC) 10 MG tablet Take 10 mg by mouth daily.    [provider]  chlorthalidone (HYGROTON) 25 MG tablet  Take 25 mg by mouth daily.    [provider]  Cholecalciferol 25 MCG (1000 UT) tablet Take 1,000 Units by mouth daily.    [provider]  HYDROcodone-acetaminophen (NORCO) 10-325 MG tablet Take 1-2 tablets by mouth 2 (two) times daily as needed. For severe back pain. 07/22/17   [provider]  levETIRAcetam (KEPPRA) 1000 MG tablet Take 500 mg by mouth every 12 (twelve) hours. Patient not taking: Reported on 08/06/2023 05/31/23   [provider]  levothyroxine (SYNTHROID) 137 MCG tablet Take 137 mcg by mouth daily before breakfast. 12/30/22   [provider]  lisinopril (PRINIVIL,ZESTRIL) 40 MG tablet Take 40 mg by mouth at bedtime.     [provider]  lovastatin (MEVACOR) 40 MG tablet Take 40 mg by mouth at bedtime.    [provider]  Magnesium 500 MG CAPS Take 500 mg by mouth daily.    [provider]  Multiple Vitamin (MULTIVITAMIN WITH MINERALS) TABS tablet Take 1 tablet by mouth daily. Centrum Silver for Women    [provider]  omeprazole (PRILOSEC) 20 MG capsule Take 20 mg by mouth daily before breakfast.     [provider]    Allergies:   Allergies  Allergen Reactions   Celebrex [Celecoxib] Nausea Only   Codeine Nausea Only    Social History:  reports that she has never smoked. She has never used smokeless tobacco. She reports that she does not drink alcohol and does not use drugs.  Family History: Family History  Problem Relation Age of Onset   Breast cancer Neg Hx     Physical Exam: Vitals:   08/06/23 2150 08/06/23 2155 08/06/23 2334 08/06/23 2345  BP:  (!) 124/43 (!) 122/44 (!) 110/44  Pulse:  71 71 73  Resp:  (!) 24 (!) 23 16  Temp:  98.5 F (36.9 C)    TempSrc:  Oral    SpO2: 100% 100% 100% 97%    General: Awake, intermittent encephalopathic patient, occasionally follows instructions Eyes: PERRLA, pink conjunctiva, no scleral icterus ENT: Moist oral mucosa, neck supple,  no thyromegaly Lungs: CTA B/L, no wheeze, no crackles, no use of accessory muscles Cardiovascular: RRR, no murmurs. No carotid bruits, no JVD Abdomen: soft, positive BS, NTND,  not an acute abdomen GU: not examined Neuro: CN II - XII appears grossly intact Musculoskeletal: Moves all extremities equally Skin: no rash, no subcutaneous crepitation, no decubitus Psych: Unable to properly assess  Labs on Admission:  Recent Labs    08/05/23 0505 08/06/23 2242  NA 136 140  K 4.0 3.1*  CL 102 110  CO2 23 23  GLUCOSE 184* 112*  BUN 20 15  CREATININE 1.13* 0.89  CALCIUM 8.8* 7.2*   Recent Labs    08/05/23 0505  AST 61*  ALT 36  ALKPHOS 79  BILITOT 0.9  PROT 7.0  ALBUMIN 3.2*    Recent Labs    08/06/23 1654 08/06/23 2242  WBC 2.8* 3.1*  NEUTROABS 1.5* 1.7  HGB 9.2* 9.1*  HCT 27.7* 27.6*  MCV 100.0 99.6  PLT 17* 16*    Recent Labs    08/05/23 1525  RETICCTPCT 0.5    Micro Results: Recent Results (from the past 240 hours)  Culture, blood (routine x 2)     Status: None (Preliminary result)   Collection Time: 08/05/23  4:59 AM   Specimen: BLOOD  Result Value Ref Range Status   Specimen Description BLOOD RIGHT ASSIST CONTROL  Final   Special Requests   Final    BOTTLES DRAWN AEROBIC AND ANAEROBIC Blood Culture results may not be optimal due to an inadequate volume of blood received in culture bottles   Culture   Final    NO GROWTH 1 DAY Performed at [facility], [address], [place], [place] [address]    Report Status PENDING  Incomplete  Culture, blood (routine x 2)     Status: None (Preliminary result)   Collection Time: 08/05/23  5:05 AM   Specimen: BLOOD  Result Value Ref Range Status   Specimen Description BLOOD LEFT HAND  Final   Special Requests   Final    BOTTLES DRAWN AEROBIC AND ANAEROBIC Blood Culture adequate volume   Culture   Final    NO GROWTH 1 DAY Performed at [facility], [address] [place], [place]  [address]    Report Status PENDING  Incomplete  Resp panel by RT-PCR (RSV, Flu A&B, Covid) Anterior Nasal Swab     Status: None   Collection Time: 08/05/23  5:12 AM   Specimen: Anterior Nasal Swab  Result Value Ref Range Status   SARS Coronavirus 2 by RT PCR NEGATIVE NEGATIVE Final    Comment: (NOTE) SARS-CoV-2 target nucleic acids are NOT DETECTED.  The SARS-CoV-2 RNA is generally detectable in upper respiratory specimens during the acute phase of infection. The lowest concentration of SARS-CoV-2 viral copies this assay can detect is 138 copies/mL. A negative result does not preclude SARS-Cov-2 infection and should not be used as the sole basis for treatment or other patient management decisions. A negative result may occur with  improper specimen collection/handling, submission of specimen other than nasopharyngeal swab, presence of viral mutation(s) within the areas targeted by this assay, and inadequate number of viral copies(<138 copies/mL). A negative result must be combined with clinical observations, patient history, and epidemiological information. The expected result is Negative.  Fact Sheet for Patients:  BloggerCourse.com  Fact Sheet for Healthcare Providers:  SeriousBroker.it  This test is no t yet approved or cleared by the [place] FDA and  has been authorized for detection and/or diagnosis of SARS-CoV-2 by FDA under an Emergency Use Authorization (EUA). This EUA will remain  in effect (meaning this test can be used) for the duration of the COVID-19 declaration under Section 564(b)(1) of the Act, 21 U.S.C.section 360bbb-3(b)(1), unless the authorization is terminated  or revoked sooner.       Influenza A by PCR NEGATIVE NEGATIVE Final   Influenza B by PCR NEGATIVE NEGATIVE Final    Comment: (NOTE) The Xpert Xpress SARS-CoV-2/FLU/RSV plus assay is intended as an aid in the diagnosis of influenza from  Nasopharyngeal swab specimens and should not be used as a sole basis for treatment. Nasal washings and aspirates are unacceptable for Xpert Xpress SARS-CoV-2/FLU/RSV testing.  Fact Sheet for Patients: BloggerCourse.com  Fact  Sheet for Healthcare Providers: SeriousBroker.it  This test is not yet approved or cleared by the [place] and has been authorized for detection and/or diagnosis of SARS-CoV-2 by FDA under an Emergency Use Authorization (EUA). This EUA will remain in effect (meaning this test can be used) for the duration of the COVID-19 declaration under Section 564(b)(1) of the Act, 21 U.S.C. section 360bbb-3(b)(1), unless the authorization is terminated or revoked.     Resp Syncytial Virus by PCR NEGATIVE NEGATIVE Final    Comment: (NOTE) Fact Sheet for Patients: BloggerCourse.com  Fact Sheet for Healthcare Providers: SeriousBroker.it  This test is not yet approved or cleared by the [place] FDA and has been authorized for detection and/or diagnosis of SARS-CoV-2 by FDA under an Emergency Use Authorization (EUA). This EUA will remain in effect (meaning this test can be used) for the duration of the COVID-19 declaration under Section 564(b)(1) of the Act, 21 U.S.C. section 360bbb-3(b)(1), unless the authorization is terminated or revoked.  Performed at [facility], [address] [place], [place] [address]   Urine Culture     Status: Abnormal (Preliminary result)   Collection Time: 08/05/23  5:40 AM   Specimen: Urine, Random  Result Value Ref Range Status   Specimen Description   Final    URINE, RANDOM Performed at [facility], [address] [place], [place] [address]    Special Requests URINE, CATHETERIZED  Final   Culture (A)  Final    >=100,000 COLONIES/mL ESCHERICHIA COLI SUSCEPTIBILITIES TO FOLLOW Performed at [facility], [address] [place], [place] [address]    Report Status PENDING  Incomplete     Radiological Exams on Admission: DG Chest Portable 1 View Result Date: 08/06/2023 CLINICAL DATA:  change in resp pattern, eval for aspiration. ? seizure EXAM: PORTABLE CHEST 1 VIEW COMPARISON:  08/05/2023 FINDINGS: Heart is borderline in size. Mediastinal contours within normal limits. Low lung volumes. Bibasilar opacities, favor atelectasis. No effusions or acute bony abnormality. IMPRESSION: Low lung volumes with bibasilar atelectasis. Electronically Signed   By: Charlett Nose M.D.   On: 08/06/2023 19:12   MR Brain W and Wo Contrast Result Date: 08/05/2023 CLINICAL DATA:  Provided history: Brain/CNS neoplasm, assess treatment response. Additional history provided: Seizure, history of glioblastoma status post craniotomy and tumor resection, currently receiving temozolomide and radiation therapy. EXAM: MRI HEAD WITHOUT AND WITH CONTRAST TECHNIQUE: Multiplanar, multiecho pulse sequences of the brain and surrounding structures were obtained without and with intravenous contrast. CONTRAST:  10mL GADAVIST GADOBUTROL 1 MMOL/ML IV SOLN COMPARISON:  Head CT 08/05/2023.  Brain MRI 05/12/2023. FINDINGS: The examination is intermittently motion degraded, significantly limiting evaluation. Most notably, the coronal T2 sequence through the hippocampi is moderate-to-severely motion degraded and the axial and coronal T1-weighted post-contrast sequences are severely motion degraded. Brain: Resection cavity with chronic blood products in the mid and posterior left temporal lobe. T2 FLAIR hyperintense signal abnormality surrounding the resection cavity which may reflect gliosis, edema and/or residual infiltrative tumor. No definite masslike or nodular enhancement is identified at the resection site, however, motion degradation significantly limits evaluation. Mild multifocal Mild multifocal T2 FLAIR hyperintense signal abnormality  elsewhere within the cerebral white matter, nonspecific but compatible chronic small vessel ischemic disease. Chronic lacunar infarcts again noted within the left basal ganglia. There is no acute infarct. No extra-axial fluid collection. No midline shift. Vascular: Maintained flow voids within the proximal large arterial vessels. Skull and upper cervical spine: Left temporoparietal cranioplasty. Sinuses/Orbits:  No mass or acute finding within the imaged orbits. Minimal mucosal thickening scattered throughout the paranasal sinuses. IMPRESSION: 1. Motion degraded examination, significantly limiting evaluation. 2. Prior left temporal lobe tumor resection. T2 FLAIR hyperintense signal abnormality surrounding the resection cavity may reflect gliosis, edema and/or residual infiltrative tumor. No definite masslike or nodular enhancement is identified at the resection site, however, motion degradation significantly limits evaluation. Given the severe motion degradation of multiple post-contrast sequences, a repeat brain MRI is recommended when the patient is better able to tolerate the study. 3. Chronic small vessel ischemic disease as described. Electronically Signed   By: Jackey Loge D.O.   On: 08/05/2023 11:30   DG Chest Port 1 View Result Date: 08/05/2023 CLINICAL DATA:  73 year old female with altered mental status, witnessed seizure. Left cerebral hemisphere tumor by MRI in October. Hypotensive. EXAM: PORTABLE CHEST 1 VIEW COMPARISON:  CTA chest 05/31/2016 and earlier. FINDINGS: Portable AP semi upright view at 0508 hours. Lower lung volumes. Cardiac size now at the upper limits of normal. Other mediastinal contours are within normal limits. Visualized tracheal air column is within normal limits. Bilateral lung base hypo ventilation but no superimposed pneumothorax, pulmonary edema, consolidation. Paucity of bowel gas. No acute osseous abnormality identified. IMPRESSION: Lower lung volumes with lung base hypo  ventilation. No other acute cardiopulmonary abnormality. Electronically Signed   By: Odessa Fleming M.D.   On: 08/05/2023 05:29   CT Head Wo Contrast Result Date: 08/05/2023 CLINICAL DATA:  73 year old female with altered mental status, witnessed seizure. Left cerebral hemisphere tumor by MRI in October. EXAM: CT HEAD WITHOUT CONTRAST TECHNIQUE: Contiguous axial images were obtained from the base of the skull through the vertex without intravenous contrast. RADIATION DOSE REDUCTION: This exam was performed according to the departmental dose-optimization program which includes automated exposure control, adjustment of the mA and/or kV according to patient size and/or use of iterative reconstruction technique. COMPARISON:  Brain MRI and head CT 05/12/2023. FINDINGS: Brain: Posterior left temporal lobe resection cavity now is low-density on series 2, image 15. Regional cerebral edema largely resolved. No significant regional mass effect. Superimposed chronic lacunar infarcts of the left basal ganglia appear stable. No midline shift. No ventriculomegaly. No acute intracranial hemorrhage identified. No cortically based acute infarct identified. Vascular: No suspicious intracranial vascular hyperdensity. Skull: Motion artifact, particularly at the skull base. New left lateral craniotomy. Elsewhere the skull appears intact. Sinuses/Orbits: Visualized paranasal sinuses and mastoids are stable and well aerated. Other: New postoperative changes to the left scalp. Visualized orbit soft tissues are within normal limits. IMPRESSION: 1. Interval left lateral craniotomy and tumor resection from the posterior left temporal lobe. Regional edema and mass effect appear resolved by noncontrast CT, tumor restaging by MRI without and with contrast would be most sensitive and specific. 2. No new intracranial abnormality identified. 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 Electronically Signed   By: Odessa Fleming M.D.   On: 08/05/2023  05:28    Assessment/Plan Present on Admission:  Focal seizures, persistent, recurrent -Neurohospitalist is seen patient.  IV Vimpat, Keppra. -Continuous EEG ordered -No prior history of seizures.  UTI may have lowered seizure threshold however.  Brain lesion major cause -MRI head done 08/05/2023 Motion degraded.  Prior left temporal tumor resection noted signal abnormality surrounding resection may reflect gliosis, edema and/or residual infiltrative tumor.  Repeat MRI recommended when patient was but she would not tolerate study   Acute cystitis -Urine cultures > 100,000 CFU E. Coli.  Awaiting sensitivities -Continue  IV Rocephin   Thrombocytopenia -Acquired.  Hematology consult by a.m. team. -Goal platelets in between 15-20.  Increased risk of bleeding in the setting of infection   Hypothyroidism, unspecified -Synthroid resumed   Hypertension -Zebeta, lisinopril   Glioblastoma multiforme (HCC) -Per oncologist   Pure hypercholesterolemia -Lovastatin  Shanisha Lech 08/07/2023, 12:14 AM

## 2023-08-07 NOTE — Progress Notes (Addendum)
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  Diagnosed as the patient was having some speech difficulties and was seen by neurologist. 05/29/2023 underwent left temporal craniotomy for resection of the tumor with Dr. Adriana Simas at [facility]. Was following up with oncology. Due to transportation needs she completed her radiation with concurrent chemotherapy with Temodar at [facility]. Last session was 1/17. Presented with sudden onset of confusion followed by seizures on 1/22 at [facility] hospital. Found to have severe thrombocytopenia as well as seizures.  Neurology, hematology as well as neurosurgery were consulted in the ED at [facility]. Patient was on the list for transfer to [facility] but unable to get a bed. Due to urgency of the inpatient therapy needs patient was transferred to [facility] ED.  Started on LTM EEG monitoring on arrival and seizure medications were adjusted as she was found to have ongoing seizure activities based on EEG. Received 2 platelet transfusion on 1/22. 1 more platelet transfusion 1/24. Neurology and hematology following here. Assessment and Plan: Focal seizures. In the setting of underlying tumor. Appreciate neurology consultation. Continuing LTM EEG. Appears to have ongoing seizures while loaded with Keppra as well as some Vimpat. Currently Keppra dose was increased to 1500 mg twice daily. Also on Vimpat 200 mg twice daily. Received one-time dose of perampanel 8 mg per neurology. Monitor in progressive care unit for now. Able to maintain airway and able to communicate with waxing and waning mentation.  E. coli UTI. While patient does not have any fever and unable to report any signs of UTI given her clinical presentation we will continue with IV antibiotics to treat the UTI. Continue  ceftriaxone. Total therapy at least for 5 days.  Severe thrombocytopenia. Some epistaxis noted. Thrombocytopenia most likely in the setting of concurrent use of radiation and Temodar. Image operative fraction is 1.4 therefore per hematology less likely ITP. Hemolytic workup, LDH, bilirubin level all unremarkable. B12 and folic acid were also checked. Hematology recommending conservative measures for now with supportive transfusions as needed. Transfuse for active bleeding with platelet count less than 50,000. Transfuse for platelet count less than 20,000 without active bleeding. Given her epistaxis we will continue to transfuse to maintain platelet count around 40-50,000 if possible. Discussed regarding need for Nplate, for now expectation is that the counts will recover without any intervention.  GBM. Recent diagnosis. Neurosurgery consulted at [facility] ED and recommended no intervention for surgery. Repeat MRI was motion degraded study although no definite mass or nodular involvement seen. Will pursue repeat MRI with contrast once the patient is able to tolerate the study. Outpatient follow-up with [facility] oncologist recommended.  Normocytic anemia. Leukopenia. Monitor for now. Supportive measures recommended. Given the patient has epistaxis transfuse for hemoglobin less than 7.  Hypothyroidism. TSH actually more than 10. Continue Synthroid for now we will increase the dose for tomorrow.  HTN. On bisoprolol at home. Also on chlorthalidone at home as well as lisinopril. Medications are currently on hold as the blood pressure is actually well-controlled right now. Diuretics were actually held as the patient had for AKI after receiving radiation. For now monitor closely.  GERD. Continue PPI.  HLD. On statin.  Will continue.  Goals of care conversation. Extensive discussion with the family and the patient. Patient is unable to participate in the conversation. Family would like  to continue  full code for now. Patient does have a living well, recommended family to bring her to the hospital if possible to understand what the patient's actual wishes were.  Mild hypokalemia. Hypomagnesemia. Replaced per Will monitor.  Type 2 diabetes mellitus. Hemoglobin A1c 6.7. On sliding scale insulin. Does not appear to carry the diagnosis prior to admission.  Subjective: Mentation waxing and waning.  able to follow commands.  Intermittently able to answer the questions as well.  No headache no nausea no vomiting.  Later in the day started with epistaxis which seems to have controlled for now.  Physical Exam: General: in Mild distress, No Rash, bilateral upper extremity erythema noted at the blood pressure cuff site. Cardiovascular: S1 and S2 Present, No Murmur Respiratory: Good respiratory effort, Bilateral Air entry present. No Crackles, No wheezes Abdomen: Bowel Sound present, No tenderness Extremities: Trace edema Neuro: Alert and oriented to self, unable to answer other questions, able to follow commands though. No new focal deficit.  No asterixis.  Needs around reported to have some nosebleed although no active bleeding seen at the time of my evaluation.  Data Reviewed: I have Reviewed nursing notes, Vitals, and Lab results. Since last encounter, pertinent lab results CBC and CMP   . I have ordered test including CBC and CMP  . I have discussed pt's care plan and test results with hematology  .   Disposition: Status is: Inpatient Remains inpatient appropriate because: Control of the seizures and thrombocytopenia therapy  Place and maintain sequential compression device Start: 08/07/23 0327   Family Communication: Multiple family members at bedside. Level of care: Progressive   Vitals:   08/07/23 1730 08/07/23 1800 08/07/23 1814 08/07/23 1815  BP: (!) 126/41 (!) 141/50    Pulse: 82 87    Resp: 19 (!) 21    Temp:   99.9 F (37.7 C) 99.9 F (37.7 C)  TempSrc:       SpO2: 93% 95%     The patient is critically ill with multiple organ systems failure and requires high complexity decision making for assessment and support, frequent evaluation and titration of therapies. Critical Care Time devoted to patient care services described in this note is 35 minutes   Author: Lynden Oxford, MD 08/07/2023 7:09 PM  Please look on [email or web address] to find out who is on call.

## 2023-08-07 NOTE — Progress Notes (Signed)
LTM maint complete - no skin breakdown under:  T7,A2. Head wrapped.

## 2023-08-07 NOTE — Progress Notes (Signed)
LTM maint complete - no skin breakdown seen  Patient attempted to push leads off. Several leads repaired. Patient is not monitored by [facility] due to being in the er.  Also ethernet cord replaced.

## 2023-08-07 NOTE — Evaluation (Signed)
Clinical/Bedside Swallow Evaluation Patient Details  Name: Dana Morton MRN: [id number] Date of Birth: 1951-03-17  Today's Date: 08/07/2023 Time: SLP Start Time (ACUTE ONLY): 4098 SLP Stop Time (ACUTE ONLY): 0848 SLP Time Calculation (min) (ACUTE ONLY): 31 min  Past Medical History:  Past Medical History:  Diagnosis Date   Anemia    h/o    Arthritis    Claustrophobia    Diabetes mellitus without complication (HCC)    Diverticulosis    Fibrocystic breast disease (FCBD)    Fibrocystic disease of breast    GERD (gastroesophageal reflux disease)    Herpes simplex    HLD (hyperlipidemia)    Hypercholesteremia    Hyperlipidemia    Hypertension    Hypothyroidism    Pre-diabetes    Pulmonary nodules    Pure hypercholesterolemia    Rotator cuff tear    LEFT SHOULDER   Sleep apnea    no cpap   Vitamin D deficiency    Vitamin D deficiency    Past Surgical History:  Past Surgical History:  Procedure Laterality Date   ABDOMINAL HYSTERECTOMY     BREAST BIOPSY Left 06/19/2020   stereo biopsy/ x clip/ path pending   BREAST EXCISIONAL BIOPSY Left 70/80s   benign   CHOLECYSTECTOMY     COLONOSCOPY WITH PROPOFOL N/A 03/22/2015   Procedure: COLONOSCOPY WITH PROPOFOL;  Surgeon: Wallace Cullens, MD;  Location: [facility] ENDOSCOPY;  Service: Gastroenterology;  Laterality: N/A;   COLONOSCOPY WITH PROPOFOL N/A 04/19/2021   Procedure: COLONOSCOPY WITH PROPOFOL;  Surgeon: Regis Bill, MD;  Location: [facility] ENDOSCOPY;  Service: Endoscopy;  Laterality: N/A;  AMPICILLIN - PER OFFICE   DG FOOT HEEL ([facility] HX)     DILATION AND CURETTAGE OF UTERUS     ESOPHAGOGASTRODUODENOSCOPY (EGD) WITH PROPOFOL N/A 03/22/2015   Procedure: ESOPHAGOGASTRODUODENOSCOPY (EGD) WITH PROPOFOL;  Surgeon: Wallace Cullens, MD;  Location: [facility] ENDOSCOPY;  Service: Gastroenterology;  Laterality: N/A;   JOINT REPLACEMENT     KNEE ARTHROPLASTY Right 09/23/2017   Procedure: COMPUTER ASSISTED TOTAL KNEE ARTHROPLASTY;  Surgeon: Donato Heinz, MD;  Location: [facility] ORS;  Service: Orthopedics;  Laterality: Right;   KNEE ARTHROSCOPY Right 08/02/2015   Procedure: ARTHROSCOPY KNEE, PARTIAL MEDIAL MENISECTOMY, PLICA INCISION;  Surgeon: Kennedy Bucker, MD;  Location: [facility] ORS;  Service: Orthopedics;  Laterality: Right;   KNEE ARTHROSCOPY     HPI:  Dana Morton is a 73 year old female who was admitted to [facility] from [facility] with seizures.  Pt has a recent GBM diagnosed 05/2023.  It was excised at [facility].  She underwent 3 weeks of radiation at [facility], completed last Friday. On the 22nd patient's son was called by his dad and told he was foaming at the mouth, unable to communicate.  She states [facility] ER thought to be having intractable seizures.  She was also found to have a UTI. Undergoing continuous EEG at this time    Assessment / Plan / Recommendation  Clinical Impression  Pt presents with a seemingly cognitively based dysphagia.  During initial portion of evaluation pt was unable to retrieve bolus of any texture/consistency maintaining and open mouth posture until bolus was placed in oral cavity, after which she then demonstrated oral manipulation of trials.  She acheived full clearance of puree and soft solids. Pt tolerated thin liquid by spoon in this manner, but with cup trials there is strong suspicion of premature spillage with throat clearing noted.  SLP provided nectar thick liquid to attempt to slow  passive transit of liquids.  By this time, pt began to round lips to cup and actively pull liquids into oral cavity.  With further trialsof thin liquid she exhibited good tolerance and she was then able to siphon from straw. She also fed herself a graham cracker independently without any clinical s/s of aspiration. Pt's mental status has been rapidly waxing and waning with attendant changes to communication as well.  Low suspicion for pharyngeal dysphagia, but AMS may put pt at increased risk for aspiration.  Recommend initiating oral  diet but only given POs when pt is actively participating.  Pt has had no swallowing difficulties r/t GBM diagnosis and treatment.  She had brief communication deficits post surgery which resolved within hours. She has not worked with SLP throughout her treatment.  If pt's mentation does not return to normal, consider a cognitive-linguistic evaluation.  Pt's son reports pt c/o SOB and grabing at throat with seizure like episodes.  He would reasonably feel more comfortable with softer foods.    Recommend mechanical soft solids with thin liquids when pt is attending to POs.   SLP Visit Diagnosis: Dysphagia, unspecified (R13.10)    Aspiration Risk  Mild aspiration risk    Diet Recommendation Dysphagia 3 (Mech soft);Thin liquid    Liquid Administration via: Cup;Straw Medication Administration: Crushed with puree Supervision: Patient able to self feed;Staff to assist with self feeding Compensations: Slow rate;Small sips/bites Postural Changes: Seated upright at 90 degrees    Other  Recommendations Oral Care Recommendations: Oral care BID    Recommendations for follow up therapy are one component of a multi-disciplinary discharge planning process, led by the attending physician.  Recommendations may be updated based on patient status, additional functional criteria and insurance authorization.  Follow up Recommendations Outpatient SLP (at present, with hopeful resolution of symptoms prior to d/c)      Assistance Recommended at Discharge    Functional Status Assessment Patient has had a recent decline in their functional status and demonstrates the ability to make significant improvements in function in a reasonable and predictable amount of time.  Frequency and Duration min 2x/week  2 weeks       Prognosis Prognosis for improved oropharyngeal function: Good      Swallow Study   General Date of Onset: 08/05/23 HPI: Dana Morton is a 73 year old female who was admitted to [facility] from  [facility] with seizures.  Pt has a recent GBM diagnosed 05/2023.  It was excised at [facility].  She underwent 3 weeks of radiation at [facility], completed last Friday. On the 22nd patient's son was called by his dad and told he was foaming at the mouth, unable to communicate.  She states [facility] ER thought to be having intractable seizures.  She was also found to have a UTI. Undergoing continuous EEG at this time Type of Study: Bedside Swallow Evaluation Previous Swallow Assessment: none Diet Prior to this Study: NPO Temperature Spikes Noted: No Respiratory Status: Nasal cannula History of Recent Intubation: No Behavior/Cognition: Alert;Cooperative;Requires cueing;Confused Oral Cavity Assessment: Within Functional Limits Oral Care Completed by SLP: No Oral Cavity - Dentition: Adequate natural dentition Vision: Functional for self-feeding Self-Feeding Abilities: Able to feed self;Needs assist (mental status waxes and wanes) Patient Positioning: Upright in bed Baseline Vocal Quality: Normal Volitional Cough: Cognitively unable to elicit Volitional Swallow: Unable to elicit    Oral/Motor/Sensory Function Overall Oral Motor/Sensory Function:  (Difficult to assess, but without focal deficits)   Ice Chips Ice chips: Not tested  Thin Liquid Thin Liquid: Impaired Presentation: Cup;Spoon;Straw Pharyngeal  Phase Impairments: Throat Clearing - Delayed;Throat Clearing - Immediate    Nectar Thick Nectar Thick Liquid: Within functional limits Presentation: Cup   Honey Thick Honey Thick Liquid: Not tested   Puree Puree: Impaired Oral Phase Impairments: Poor awareness of bolus (Poor bolus retrieval)   Solid     Solid: Impaired Presentation: Self Fed (SLP fed) Oral Phase Impairments: Poor awareness of bolus      Kerrie Pleasure, MA, CCC-SLP Acute Rehabilitation Services Office: [phone] 08/07/2023,9:23 AM

## 2023-08-07 NOTE — Progress Notes (Signed)
LTM maint complete - no skin breakdown under: Fp1 Fp2  service leads applied hair netting. [facility] monitored, Event button test confirmed by [facility].

## 2023-08-07 NOTE — Progress Notes (Signed)
Subjective: Has had subclinical seizures overnight.  Per daughter and husband at baseline, patient was given Keppra and steroids for 7 to 10 days after her surgery but has never had any seizures before.  ROS: Unable to obtain due to poor mental status  Examination  Vital signs in last 24 hours: Temp:  [98.2 F (36.8 C)-98.9 F (37.2 C)] 98.9 F (37.2 C) (01/24 1005) Pulse Rate:  [66-81] 79 (01/24 1024) Resp:  [13-27] 25 (01/24 1024) BP: (95-136)/(39-78) 125/54 (01/24 1024) SpO2:  [93 %-100 %] 98 % (01/24 1024)  General: lying in bed, bleeding from right nose Neuro: Patient's mental status is fluctuating.  At the beginning of my exam, she did not answer any questions and did not follow any commands.  However after few minutes she was able to tell me her name and recognize her family members, follows simple commands, had antigravity strength in all 4 extremities.  Then in the middle of the exam she started perseverating on her name and did not answer rest of the questions  Basic Metabolic Panel: Recent Labs  Lab 08/05/23 0505 08/06/23 2242 08/07/23 0524  NA 136 140 140  K 4.0 3.1* 3.5  CL 102 110 105  CO2 23 23 26   GLUCOSE 184* 112* 119*  BUN 20 15 15   CREATININE 1.13* 0.89 0.93  CALCIUM 8.8* 7.2* 8.1*  MG  --   --  1.2*    CBC: Recent Labs  Lab 08/05/23 0505 08/05/23 0635 08/05/23 1525 08/06/23 1654 08/06/23 2242 08/07/23 0524  WBC 3.2* 3.2* 3.2* 2.8* 3.1* 3.0*  NEUTROABS 1.8  --  1.8 1.5* 1.7 1.7  HGB 11.5* 10.5* 9.5* 9.2* 9.1* 9.2*  HCT 34.0* 31.1* 27.7* 27.7* 27.6* 27.2*  MCV 97.4 98.1 95.8 100.0 99.6 97.8  PLT 8* 6* 28* 17* 16* 14*     Coagulation Studies: Recent Labs    08/05/23 0505  LABPROT 13.3  INR 1.0    Imaging personally reviewed  MRI brain with and without contrast 08/05/2023: Prior left temporal lobe tumor resection. T2 FLAIR hyperintense signal abnormality surrounding the resection cavity may reflect gliosis, edema and/or residual  infiltrative tumor. No definite mass like or nodular enhancement is identified at the resection site, however, motion degradation significantly limits evaluation. Chronic small vessel ischemic disease as described.    ASSESSMENT AND PLAN: 73 year old female with  PMHx of left temporal lobe GBM IDH wild-type, WHO grade 4 s/p resection at [facility] last year, s/p radiation and chemotherapy with temozolamide, anemia, DM, HLD, HTN, hypothyroidism, pre-diabetes, sleep apnea and vitamin D deficiency who initiually presented to the [facility] ED on Wednesday morning with new onset of focal seizure activity. Family stated that the patient went to the restroom, came back to bed and began to have focal seizures manifesting as twitching of the right side of her face. She was also foaming at the mouth and unable to communicate. Due to recurrent seizures in the [facility] ED and waxing/waning mentation, she has been transferred to [facility] for LTM EEG.   Focal seizures -Seizures in the setting of underlying tumor, E coli UTI as well as hypomagnesemia (magnesium was 1.2 yesterday) -Patient has continued to have subclinical seizures overnight  Recommendations -Give one-time dose of perampanel 8 mg -Increase Keppra to 1500 mg twice daily -Continue Vimpat 200 mg twice daily -Of note, platelets are very low therefore avoid Depakote -Continue LTM EEG till seizures resolve -Management of UTI and hypomagnesemia per primary team -Discussed plan with Dr. Allena Katz via secure chat  I have spent a total of   36 minutes with the patient reviewing hospital notes,  test results, labs and examining the patient as well as establishing an assessment and plan.  > 50% of time was spent in direct patient care.       Lindie Spruce Epilepsy [facility] For questions after 5pm please refer to AMION to reach the Neurologist on call

## 2023-08-08 ENCOUNTER — Other Ambulatory Visit: Payer: Self-pay

## 2023-08-08 DIAGNOSIS — R569 Unspecified convulsions: Secondary | ICD-10-CM | POA: Diagnosis not present

## 2023-08-08 LAB — CBC WITH DIFFERENTIAL/PLATELET
Abs Immature Granulocytes: 0.02 10*3/uL (ref 0.00–0.07)
Basophils Absolute: 0 10*3/uL (ref 0.0–0.1)
Basophils Relative: 0 %
Eosinophils Absolute: 0.1 10*3/uL (ref 0.0–0.5)
Eosinophils Relative: 5 %
HCT: 24 % — ABNORMAL LOW (ref 36.0–46.0)
Hemoglobin: 8.2 g/dL — ABNORMAL LOW (ref 12.0–15.0)
Immature Granulocytes: 1 %
Lymphocytes Relative: 40 %
Lymphs Abs: 0.8 10*3/uL (ref 0.7–4.0)
MCH: 32.9 pg (ref 26.0–34.0)
MCHC: 34.2 g/dL (ref 30.0–36.0)
MCV: 96.4 fL (ref 80.0–100.0)
Monocytes Absolute: 0.1 10*3/uL (ref 0.1–1.0)
Monocytes Relative: 3 %
Neutro Abs: 1 10*3/uL — ABNORMAL LOW (ref 1.7–7.7)
Neutrophils Relative %: 51 %
Platelets: 21 10*3/uL — CL (ref 150–400)
RBC: 2.49 MIL/uL — ABNORMAL LOW (ref 3.87–5.11)
RDW: 13.6 % (ref 11.5–15.5)
Smear Review: DECREASED
WBC: 2 10*3/uL — ABNORMAL LOW (ref 4.0–10.5)
nRBC: 0 % (ref 0.0–0.2)

## 2023-08-08 LAB — BPAM PLATELET PHERESIS
Blood Product Expiration Date: 202501252359
ISSUE DATE / TIME: 202501241256
Unit Type and Rh: 5100

## 2023-08-08 LAB — COMPREHENSIVE METABOLIC PANEL
ALT: 28 U/L (ref 0–44)
AST: 42 U/L — ABNORMAL HIGH (ref 15–41)
Albumin: 2.5 g/dL — ABNORMAL LOW (ref 3.5–5.0)
Alkaline Phosphatase: 79 U/L (ref 38–126)
Anion gap: 11 (ref 5–15)
BUN: 10 mg/dL (ref 8–23)
CO2: 25 mmol/L (ref 22–32)
Calcium: 8.2 mg/dL — ABNORMAL LOW (ref 8.9–10.3)
Chloride: 105 mmol/L (ref 98–111)
Creatinine, Ser: 0.92 mg/dL (ref 0.44–1.00)
GFR, Estimated: >60 mL/min (ref >60)
Glucose, Bld: 111 mg/dL — ABNORMAL HIGH (ref 70–99)
Potassium: 3.3 mmol/L — ABNORMAL LOW (ref 3.5–5.1)
Sodium: 141 mmol/L (ref 135–145)
Total Bilirubin: 0.9 mg/dL (ref 0.0–1.2)
Total Protein: 5.7 g/dL — ABNORMAL LOW (ref 6.5–8.1)

## 2023-08-08 LAB — PREPARE PLATELET PHERESIS: Unit division: 0

## 2023-08-08 LAB — GLUCOSE, CAPILLARY
Glucose-Capillary: 106 mg/dL — ABNORMAL HIGH (ref 70–99)
Glucose-Capillary: 161 mg/dL — ABNORMAL HIGH (ref 70–99)
Glucose-Capillary: 131 mg/dL — ABNORMAL HIGH (ref 70–99)
Glucose-Capillary: 177 mg/dL — ABNORMAL HIGH (ref 70–99)

## 2023-08-08 LAB — MAGNESIUM: Magnesium: 1.6 mg/dL — ABNORMAL LOW (ref 1.7–2.4)

## 2023-08-08 LAB — PHOSPHORUS: Phosphorus: 3.7 mg/dL (ref 2.5–4.6)

## 2023-08-08 MED ORDER — ORAL CARE MOUTH RINSE: 15.0000 mL | OROMUCOSAL | Status: DC | PRN

## 2023-08-08 MED ORDER — MAGNESIUM SULFATE 4 GM/100ML IV SOLN
4.0000 g | Freq: Once | INTRAVENOUS | Status: AC
  Administered 2023-08-08: 4 g via INTRAVENOUS
  Filled 2023-08-08: qty 100

## 2023-08-08 MED ORDER — OXYMETAZOLINE HCL 0.05 % NA SOLN
3.0000 | Freq: Two times a day (BID) | NASAL | Status: AC
  Administered 2023-08-08 – 2023-08-10 (×6): 3 via NASAL
  Filled 2023-08-08: qty 30

## 2023-08-08 MED ORDER — ENSURE ENLIVE PO LIQD
237.0000 mL | Freq: Two times a day (BID) | ORAL | Status: DC
  Administered 2023-08-08 – 2023-08-12 (×8): 237 mL via ORAL

## 2023-08-08 MED ORDER — POTASSIUM CHLORIDE CRYS ER 20 MEQ PO TBCR
40.0000 meq | EXTENDED_RELEASE_TABLET | ORAL | Status: AC
  Administered 2023-08-08 (×2): 40 meq via ORAL
  Filled 2023-08-08 (×2): qty 2

## 2023-08-08 MED ORDER — EPOETIN ALFA-EPBX 10000 UNIT/ML IJ SOLN
10000.0000 [IU] | Freq: Once | INTRAMUSCULAR | Status: AC
  Administered 2023-08-08: 10000 [IU] via SUBCUTANEOUS
  Filled 2023-08-08: qty 1

## 2023-08-08 MED ORDER — OXYMETAZOLINE HCL 0.05 % NA SOLN: 2.0000 | NASAL | Status: DC | PRN

## 2023-08-08 NOTE — Progress Notes (Signed)
Subjective: States that last night she felt very hot. Family states she was extremely restless and stated that the mattress was "red hot". Kept repeating the word "red" < 5 min. Now she is feeling better. Following commands appropriately.   ROS:   Examination  Vital signs in last 24 hours: Temp:  [98.2 F (36.8 C)-100.7 F (38.2 C)] 99.2 F (37.3 C) (01/25 0557) Pulse Rate:  [66-89] 66 (01/25 0557) Resp:  [16-28] 20 (01/25 0557) BP: (102-141)/(37-78) 118/53 (01/25 0557) SpO2:  [92 %-100 %] 95 % (01/25 0557) Weight:  [578 kg] 111 kg (01/25 0251)  General: lying in bed, bleeding from right nose Neuro: Patient is alert and conversant. She states that she was hot over night. Follows simple commands, had antigravity strength in all 4 extremities. States legs are sensitive. Sensation intact.  Continues to have some aphasia at times word salad answers to orientation questions on MD exam  Basic Metabolic Panel: Recent Labs  Lab 08/05/23 0505 08/06/23 2242 08/07/23 0524 08/07/23 2011 08/08/23 0714  NA 136 140 140  --  141  K 4.0 3.1* 3.5  --  3.3*  CL 102 110 105  --  105  CO2 23 23 26   --  25  GLUCOSE 184* 112* 119*  --  111*  BUN 20 15 15   --  10  CREATININE 1.13* 0.89 0.93  --  0.92  CALCIUM 8.8* 7.2* 8.1*  --  8.2*  MG  --   --  1.2* 1.8 1.6*  PHOS  --   --   --   --  3.7    CBC: Recent Labs  Lab 08/06/23 1654 08/06/23 2242 08/07/23 0524 08/07/23 2011 08/08/23 0714  WBC 2.8* 3.1* 3.0* 2.8* 2.0*  NEUTROABS 1.5* 1.7 1.7 1.7 1.0*  HGB 9.2* 9.1* 9.2* 8.8* 8.2*  HCT 27.7* 27.6* 27.2* 26.0* 24.0*  MCV 100.0 99.6 97.8 97.0 96.4  PLT 17* 16* 14* 11* 21*     Coagulation Studies: No results for input(s): "LABPROT", "INR" in the last 72 hours.   Imaging personally reviewed  MRI brain with and without contrast 08/05/2023: Prior left temporal lobe tumor resection. T2 FLAIR hyperintense signal abnormality surrounding the resection cavity may reflect gliosis, edema and/or  residual infiltrative tumor. No definite mass like or nodular enhancement is identified at the resection site, however, motion degradation significantly limits evaluation. Chronic small vessel ischemic disease as described.    cEEG: 08/08/2023 0304 to 08/08/2023 0730  ABNORMALITY - Lateralized periodic discharges with overriding fast activity ( LPD +F ) left hemisphere - Continuous slow, left hemisphere IMPRESSION: This study showed evidence of epileptogenicity arising from left hemisphere which is on the ictal-interictal continuum with increased risk of seizure recurrence. Additionally there was cortical dysfunction in left hemisphere likely secondary to underlying structural abnormality and seizures. No definite seizures were noted  ASSESSMENT AND PLAN: 73 year old female with  PMHx of left temporal lobe GBM IDH wild-type, WHO grade 4 s/p resection at [facility] last year, s/p radiation and chemotherapy with temozolamide, anemia, DM, HLD, HTN, hypothyroidism, pre-diabetes, sleep apnea and vitamin D deficiency who initiually presented to the [facility] ED on Wednesday morning with new onset of focal seizure activity. Family stated that the patient went to the restroom, came back to bed and began to have focal seizures manifesting as twitching of the right side of her face. She was also foaming at the mouth and unable to communicate. Due to recurrent seizures in the [facility] ED and waxing/waning mentation, she  has been transferred to [facility] for LTM EEG.   Focal seizures -Seizures in the setting of underlying tumor, E coli UTI as well as hypomagnesemia (magnesium was 1.2 yesterday) -Patient without electrographic seizures but an event overnight delirium vs. focal seizure   Recommendations -s/p one-time dose of perampanel 8 mg 1/24 afternoon -Hold off on further perampanel for now -Continue Keppra to 1500 mg twice daily -Continue Vimpat 200 mg twice daily -Of note, platelets are very low therefore avoiding  Depakote -Continue LTM EEG for today given she still has significant fluctuations in speech; if it remains negative tomorrow will follow clinically -Management of UTI and hypomagnesemia per primary team  Seen with assistance of NP [facility]   Brooke Dare MD-PhD [facility] [phone] Available 7 AM to 7 PM, outside these hours please contact Neurologist on call listed on AMION   Attending Neurologist's note:  I personally saw this patient, gathering history, performing a full neurologic examination, reviewing relevant labs, personally reviewing relevant imaging including MRI brain (reviewed with family at bedside), and formulated the assessment and plan, adding the note above for completeness and clarity to accurately reflect my thoughts  >35 min of care, majority at bedside

## 2023-08-08 NOTE — Plan of Care (Addendum)
Conts on EEG, iv keppra, and IV vimpat. Mag and K replaced.   Remains pleasantly confused. Plt is 21. No seizure noted   Problem: Coping: Goal: Ability to adjust to condition or change in health will improve Outcome: Not Met (add Reason)   Problem: Health Behavior/Discharge Planning: Goal: Ability to identify and utilize available resources and services will improve Outcome: Not Met (add Reason) Goal: Ability to manage health-related needs will improve Outcome: Not Met (add Reason)   Problem: Metabolic: Goal: Ability to maintain appropriate glucose levels will improve Outcome: Not Met (add Reason)   Problem: Education: Goal: Knowledge of General Education information will improve Description: Including pain rating scale, medication(s)/side effects and non-pharmacologic comfort measures Outcome: Not Met (add Reason)   Problem: Tissue Perfusion: Goal: Adequacy of tissue perfusion will improve Outcome: Not Met (add Reason)   Problem: Activity: Goal: Risk for activity intolerance will decrease Outcome: Not Met (add Reason)   Problem: Pain Managment: Goal: General experience of comfort will improve and/or be controlled Outcome: Not Met (add Reason)   Problem: Safety: Goal: Ability to remain free from injury will improve Outcome: Not Met (add Reason)  Problem: Education: Seizure Goal: Expressions of having a comfortable level of knowledge regarding the disease process will increase 08/08/2023 1516 by Ileana Roup, RN Outcome: Not Met (add Reason) 08/08/2023 1516 by Ileana Roup, RN Outcome: Not Met (add Reason)

## 2023-08-08 NOTE — Progress Notes (Signed)
MB performed maintenance on electrodes. All impedances are below 10k ohms. No skin breakdown noted. Mb re-wrapped head to secure leads.

## 2023-08-08 NOTE — Plan of Care (Signed)

## 2023-08-08 NOTE — Progress Notes (Signed)
[facility] Progress Note Patient: Dana Morton NGE:[id number] DOB: [birth year] DOA: 08/06/2023  DOS: the patient was seen and examined on 08/08/2023  Brief Hospital Course: PMH of GBM diagnosed in November.  Diagnosed as the patient was having some speech difficulties and was seen by neurologist. 05/29/2023 underwent left temporal craniotomy for resection of the tumor with Dr. Adriana Simas at [facility]. Was following up with oncology. Due to transportation needs she completed her radiation with concurrent chemotherapy with Temodar at [facility]. Last session was 1/17. Presented with sudden onset of confusion followed by seizures on 1/22 at [facility] hospital. Found to have severe thrombocytopenia as well as seizures.  Neurology, hematology as well as neurosurgery were consulted in the ED at [facility]. Patient was on the list for transfer to [facility] but unable to get a bed. Due to urgency of the inpatient therapy needs patient was transferred to [facility] ED.  Started on LTM EEG monitoring on arrival and seizure medications were adjusted as she was found to have ongoing seizure activities based on EEG. Received 2 platelet transfusion on 1/22. 1 more platelet transfusion 1/24.  Received 2 more platelet transfusion on 1/24. Neurology and hematology following here.  Assessment and Plan: Focal seizures. In the setting of underlying tumor. Appreciate neurology consultation. Continuing LTM EEG. Appears to have ongoing seizures while loaded with Keppra as well as some Vimpat. Currently Keppra dose was increased to 1500 mg twice daily. Also on Vimpat 200 mg twice daily. Received one-time dose of perampanel 8 mg per neurology. Monitor in progressive care unit for now. Able to maintain airway and able to communicate with waxing and waning mentation. EEG appears to show no evidence of ongoing seizures for now.Neurology planning to continue LTM EEG today  E. coli UTI. While patient does not  have any fever and unable to report any signs of UTI given her clinical presentation we will continue with IV antibiotics to treat the UTI. Continue ceftriaxone. Total therapy at least for 5 days.  Continue IV for now while the patient is not consistently taking p.o.  Severe thrombocytopenia. Chemotherapy-induced pancytopenia. Some epistaxis noted on 1/24, now resolved. Thrombocytopenia most likely in the setting of concurrent use of radiation and Temodar. Hemolytic workup, LDH, bilirubin level all unremarkable.  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 B12 and folic acid normal. Appreciate hematology consultation. Transfuse for active bleeding with platelet count less than 50,000. Transfuse for platelet count less than 20,000 without active bleeding. Currently no approved indication for use of Nplate.  GBM. Recent diagnosis. Neurosurgery consulted at [facility] ED and recommended no intervention for surgery. Repeat MRI was motion degraded study although no definite mass or nodular involvement seen. Will pursue repeat MRI with contrast once the patient is able to tolerate the study. Outpatient follow-up with [facility] oncologist recommended.  Normocytic anemia. Monitor for now. Supportive measures recommended. Given the patient has epistaxis transfuse for hemoglobin less than 8. Hematology will provide Procrit.  Leukopenia. ANC 1.0. If lower hematology considering G-CSF injection to keep ANC more than 1.  Hypothyroidism. TSH actually more than 10. Continue Synthroid.  Dose increased.  HTN. On bisoprolol at home. Also on chlorthalidone at home as well as lisinopril. Medications are currently on hold as the blood pressure is actually well-controlled right now. Diuretics were actually held as the patient had for AKI after receiving radiation. For now monitor closely.  GERD. Continue PPI.  HLD. On statin.  Will continue.  Goals of care conversation. Extensive discussion with the family and  the  patient. Patient is unable to participate in the conversation. Family would like to continue full code for now. Patient does have a living well, recommended family to bring her to the hospital if possible to understand what the patient's actual wishes were.  Mild hypokalemia. Hypomagnesemia. Replaced Will monitor.  Type 2 diabetes mellitus. Hemoglobin A1c 6.7. On sliding scale insulin. Does not appear to carry the diagnosis prior to admission.  Subjective: No nausea no vomiting no fever no chills.  Mentation improving per family.  Oral intake limited.  Physical Exam: In mild distress. No rash. S1-S2 present. Clear to auscultation. Bowel sound present. Nontender. No edema significantly. Equal strength bilaterally.  Data Reviewed: I have Reviewed nursing notes, Vitals, and Lab results. Reviewed CBC and BMP.  Reviewed CBC and CMP.  Discussed with hematology.  Also discussed with neurology.  Disposition: Status is: Inpatient Remains inpatient appropriate because: Control of the seizures and thrombocytopenia therapy  Place and maintain sequential compression device Start: 08/07/23 0327   Family Communication: Multiple family members at bedside. Level of care: Progressive   Vitals:   08/08/23 0557 08/08/23 0757 08/08/23 1156 08/08/23 1633  BP: (!) 118/53 (!) 121/44 (!) 114/52 (!) 130/55  Pulse: 66 66 80 85  Resp: 20 18 18    Temp: 99.2 F (37.3 C) 97.9 F (36.6 C) 98 F (36.7 C) 98.4 F (36.9 C)  TempSrc: Axillary Oral  Oral  SpO2: 95% 96% 96% 93%  Weight:      Height:       Author: Lynden Oxford, MD 08/08/2023 6:49 PM  Please look on [email or web address] to find out who is on call.

## 2023-08-08 NOTE — Procedures (Signed)
Patient Name: Dana Morton  MRN: [id number]  Epilepsy Attending: Charlsie Quest  Referring Physician/Provider: Caryl Pina, MD  Duration: 08/08/2023 0304 to 08/09/2023 0304   Patient history: 73 y.o. female with a PMHx of left temporal lobe GBM IDH wild-type, WHO grade 4 s/p resection at [facility] last year, s/p radiation and chemotherapy with temozolamide who initiually presented with new onset of focal seizure activity. EEG to evaluate for seizure   Level of alertness: awake, asleep    AEDs during EEG study: LEV, LCM, Perampanel   Technical aspects: This EEG study was done with scalp electrodes positioned according to the 10-20 International system of electrode placement. Electrical activity was reviewed with band pass filter of 1-70Hz , sensitivity of 7 uV/mm, display speed of 1mm/sec with a 60Hz  notched filter applied as appropriate. EEG data were recorded continuously and digitally stored.  Video monitoring was available and reviewed as appropriate.   Description: The posterior dominant rhythm consists of 8 Hz activity of moderate voltage (25-35 uV) seen predominantly in posterior head regions, asymmetric ( left<right) and reactive to eye opening and eye closing. Sleep was characterized by vertex waves, sleep spindles (12 to 14 Hz), maximal frontocentral region. Hyperventilation and photic stimulation were not performed. Lateralized periodic discharges with overriding fast activity were noted in left hemisphere maximal left temporal region at 1 to 1.5 Hz. EEG also showed continuous 3 to 6 Hz theta-delta slowing in left hemisphere.   ABNORMALITY - Lateralized periodic discharges with overriding fast activity ( LPD +F ) left hemisphere - Continuous slow, left hemisphere   IMPRESSION: This study showed evidence of epileptogenicity arising from left hemisphere which is on the ictal-interictal continuum with increased risk of seizure recurrence. Additionally there was cortical dysfunction in  left hemisphere likely secondary to underlying structural abnormality and seizures. No definite seizures were noted   [name] [name]

## 2023-08-08 NOTE — Progress Notes (Signed)
Patient having episodes of confusion, at this time has pulled out both IV sites and attempting to remove EEG leads.  Mitts applied for safety, pt tolerated well, will continue to monitor.

## 2023-08-09 DIAGNOSIS — R569 Unspecified convulsions: Secondary | ICD-10-CM | POA: Diagnosis not present

## 2023-08-09 LAB — GLUCOSE, CAPILLARY
Glucose-Capillary: 124 mg/dL — ABNORMAL HIGH (ref 70–99)
Glucose-Capillary: 136 mg/dL — ABNORMAL HIGH (ref 70–99)
Glucose-Capillary: 102 mg/dL — ABNORMAL HIGH (ref 70–99)
Glucose-Capillary: 175 mg/dL — ABNORMAL HIGH (ref 70–99)

## 2023-08-09 LAB — CBC WITH DIFFERENTIAL/PLATELET
Abs Immature Granulocytes: 0.01 10*3/uL (ref 0.00–0.07)
Basophils Absolute: 0 10*3/uL (ref 0.0–0.1)
Basophils Relative: 0 %
Eosinophils Absolute: 0.1 10*3/uL (ref 0.0–0.5)
Eosinophils Relative: 4 %
HCT: 24.1 % — ABNORMAL LOW (ref 36.0–46.0)
Hemoglobin: 8.4 g/dL — ABNORMAL LOW (ref 12.0–15.0)
Immature Granulocytes: 1 %
Lymphocytes Relative: 41 %
Lymphs Abs: 0.8 10*3/uL (ref 0.7–4.0)
MCH: 33.1 pg (ref 26.0–34.0)
MCHC: 34.9 g/dL (ref 30.0–36.0)
MCV: 94.9 fL (ref 80.0–100.0)
Monocytes Absolute: 0.1 10*3/uL (ref 0.1–1.0)
Monocytes Relative: 4 %
Neutro Abs: 0.9 10*3/uL — ABNORMAL LOW (ref 1.7–7.7)
Neutrophils Relative %: 50 %
Platelets: 8 10*3/uL — CL (ref 150–400)
RBC: 2.54 MIL/uL — ABNORMAL LOW (ref 3.87–5.11)
RDW: 13.7 % (ref 11.5–15.5)
Smear Review: DECREASED
WBC: 1.9 10*3/uL — ABNORMAL LOW (ref 4.0–10.5)
nRBC: 0 % (ref 0.0–0.2)

## 2023-08-09 LAB — COMPREHENSIVE METABOLIC PANEL
ALT: 29 U/L (ref 0–44)
AST: 47 U/L — ABNORMAL HIGH (ref 15–41)
Albumin: 2.5 g/dL — ABNORMAL LOW (ref 3.5–5.0)
Alkaline Phosphatase: 80 U/L (ref 38–126)
Anion gap: 7 (ref 5–15)
BUN: 10 mg/dL (ref 8–23)
CO2: 23 mmol/L (ref 22–32)
Calcium: 8.5 mg/dL — ABNORMAL LOW (ref 8.9–10.3)
Chloride: 106 mmol/L (ref 98–111)
Creatinine, Ser: 0.96 mg/dL (ref 0.44–1.00)
GFR, Estimated: >60 mL/min (ref >60)
Glucose, Bld: 125 mg/dL — ABNORMAL HIGH (ref 70–99)
Potassium: 4 mmol/L (ref 3.5–5.1)
Sodium: 136 mmol/L (ref 135–145)
Total Bilirubin: 1.1 mg/dL (ref 0.0–1.2)
Total Protein: 6 g/dL — ABNORMAL LOW (ref 6.5–8.1)

## 2023-08-09 LAB — CBC
HCT: 26.2 % — ABNORMAL LOW (ref 36.0–46.0)
Hemoglobin: 9.1 g/dL — ABNORMAL LOW (ref 12.0–15.0)
MCH: 33.3 pg (ref 26.0–34.0)
MCHC: 34.7 g/dL (ref 30.0–36.0)
MCV: 96 fL (ref 80.0–100.0)
Platelets: 17 10*3/uL — CL (ref 150–400)
RBC: 2.73 MIL/uL — ABNORMAL LOW (ref 3.87–5.11)
RDW: 13.6 % (ref 11.5–15.5)
WBC: 1.7 10*3/uL — ABNORMAL LOW (ref 4.0–10.5)
nRBC: 0 % (ref 0.0–0.2)

## 2023-08-09 LAB — BPAM PLATELET PHERESIS
Blood Product Expiration Date: 202501262359
Blood Product Expiration Date: 202501262359
ISSUE DATE / TIME: 202501242257
ISSUE DATE / TIME: 202501250218
Unit Type and Rh: 202501262359
Unit Type and Rh: 5100
Unit Type and Rh: 5100

## 2023-08-09 LAB — PREPARE PLATELET PHERESIS
Unit division: 0
Unit division: 0

## 2023-08-09 LAB — MAGNESIUM: Magnesium: 1.7 mg/dL (ref 1.7–2.4)

## 2023-08-09 MED ORDER — CEFADROXIL 500 MG PO CAPS
1000.0000 mg | ORAL_CAPSULE | Freq: Two times a day (BID) | ORAL | Status: DC
  Administered 2023-08-10 (×2): 1000 mg via ORAL
  Filled 2023-08-09 (×3): qty 2

## 2023-08-09 MED ORDER — CEFPODOXIME PROXETIL 100 MG/5ML PO SUSR: 200.0000 mg | Freq: Two times a day (BID) | ORAL | Status: DC

## 2023-08-09 MED ORDER — SODIUM CHLORIDE 0.9% IV SOLUTION: Freq: Once | INTRAVENOUS | Status: AC

## 2023-08-09 MED ORDER — LIDOCAINE 4 % EX CREA: TOPICAL_CREAM | Freq: Three times a day (TID) | CUTANEOUS | Status: DC | PRN

## 2023-08-09 NOTE — Progress Notes (Signed)
Subjective: States that last night she felt very hot again typically happening at 5:30 to 6 AM. Family notes overall improving, no more episodes of getting stuck on a single word   Family concerned about episodes of right leg sensory changes / burning sensation   ROS: Eager to go home, no acute complaints  Examination  Vital signs in last 24 hours: Temp:  [97.8 F (36.6 C)-99.5 F (37.5 C)] 98.1 F (36.7 C) (01/26 1200) Pulse Rate:  [76-92] 84 (01/26 1200) Resp:  [15-18] 16 (01/26 1205) BP: (130-153)/(52-61) 146/61 (01/26 1200) SpO2:  [92 %-98 %] 96 % (01/26 1200)  General: lying in bed, bleeding from right nose Neuro: Patient is alert and conversant. She states that she was hot over night. Follows simple commands, had antigravity strength in all 4 extremities. States legs are sensitive. Sensation intact.  Improving but still present mild to moderate aphasia. Difficulty with repetition, names most but not all simple objects, at times requires mimicry to follow commands  Basic Metabolic Panel: Recent Labs  Lab 08/05/23 0505 08/06/23 2242 08/07/23 0524 08/07/23 2011 08/08/23 0714 08/09/23 0711  NA 136 140 140  --  141 136  K 4.0 3.1* 3.5  --  3.3* 4.0  CL 102 110 105  --  105 106  CO2 23 23 26   --  25 23  GLUCOSE 184* 112* 119*  --  111* 125*  BUN 20 15 15   --  10 10  CREATININE 1.13* 0.89 0.93  --  0.92 0.96  CALCIUM 8.8* 7.2* 8.1*  --  8.2* 8.5*  MG  --   --  1.2* 1.8 1.6* 1.7  PHOS  --   --   --   --  3.7  --     CBC: Recent Labs  Lab 08/06/23 2242 08/07/23 0524 08/07/23 2011 08/08/23 0714 08/09/23 0711  WBC 3.1* 3.0* 2.8* 2.0* 1.9*  NEUTROABS 1.7 1.7 1.7 1.0* 0.9*  HGB 9.1* 9.2* 8.8* 8.2* 8.4*  HCT 27.6* 27.2* 26.0* 24.0* 24.1*  MCV 99.6 97.8 97.0 96.4 94.9  PLT 16* 14* 11* 21* 8*     Coagulation Studies: No results for input(s): "LABPROT", "INR" in the last 72 hours.   Imaging personally reviewed  MRI brain with and without contrast 08/05/2023:  Prior left temporal lobe tumor resection. T2 FLAIR hyperintense signal abnormality surrounding the resection cavity may reflect gliosis, edema and/or residual infiltrative tumor. No definite mass like or nodular enhancement is identified at the resection site, however, motion degradation significantly limits evaluation. Chronic small vessel ischemic disease as described.    cEEG: 08/08/2023 0304 to 08/08/2023 0730  ABNORMALITY - Lateralized periodic discharges with overriding fast activity ( LPD +F ) left hemisphere - Continuous slow, left hemisphere IMPRESSION: This study showed evidence of epileptogenicity arising from left hemisphere which is on the ictal-interictal continuum with increased risk of seizure recurrence. Additionally there was cortical dysfunction in left hemisphere likely secondary to underlying structural abnormality and seizures. No definite seizures were noted  ASSESSMENT AND PLAN: 73 year old female with  PMHx of left temporal lobe GBM IDH wild-type, WHO grade 4 s/p resection at [facility] last year, s/p radiation and chemotherapy with temozolamide, anemia, DM, HLD, HTN, hypothyroidism, pre-diabetes, sleep apnea and vitamin D deficiency who initiually presented to the [facility] ED on Wednesday morning with new onset of focal seizure activity. Family stated that the patient went to the restroom, came back to bed and began to have focal seizures manifesting as twitching of the right  side of her face. She was also foaming at the mouth and unable to communicate. Due to recurrent seizures in the [facility] ED and waxing/waning mentation, she has been transferred to [facility] for LTM EEG.   We discussed that her right leg sensory changes may be secondary to focal seizure and family should keep track of the frequency and duration of these events per discussion with her outpatient neurologist.  However at this time family is in agreement that they would prefer to avoid further up titration of her seizure  medications unless clearly needed to minimize sedation/delirium  It is also not clear whether she definitely has symptoms only in 1 leg or in both legs; differential for the sensory changes would be neuropathy.  To avoid sedation we will try topical lidocaine gel for this symptom  Focal seizures, well controlled -Seizures in the setting of underlying tumor, E coli UTI as well as hypomagnesemia (magnesium was 1.2)  Recommendations -s/p one-time dose of perampanel 8 mg 1/24 afternoon, no further perampanel indicated -Continue Keppra 1500 mg twice daily -Continue Vimpat 200 mg twice daily -Of note, platelets are very low therefore avoiding Depakote -Lidocaine gel ordered for potential neuropathy of the legs -Management of UTI and hypomagnesemia and critically low platelets per primary team  -No further inpatient neurological testing, inpatient neurology will sign off.  Please do not hesitate to reach out if any questions or concerns arise including if patient's examination is worsening or if there is further concern for seizure activity etc.  Brooke Dare MD-PhD [facility] [phone] Available 7 AM to 7 PM, outside these hours please contact Neurologist on call listed on AMION

## 2023-08-09 NOTE — Procedures (Addendum)
Patient Name: Dana Morton  MRN: [id number]  Epilepsy Attending: Charlsie Quest  Referring Physician/Provider: Caryl Pina, MD  Duration: 08/09/2023 0304 to 08/09/2023 1301   Patient history: 73 y.o. female with a PMHx of left temporal lobe GBM IDH wild-type, WHO grade 4 s/p resection at [facility] last year, s/p radiation and chemotherapy with temozolamide who initiually presented with new onset of focal seizure activity. EEG to evaluate for seizure   Level of alertness: awake, asleep    AEDs during EEG study: LEV, LCM, Perampanel   Technical aspects: This EEG study was done with scalp electrodes positioned according to the 10-20 International system of electrode placement. Electrical activity was reviewed with band pass filter of 1-70Hz , sensitivity of 7 uV/mm, display speed of 33mm/sec with a 60Hz  notched filter applied as appropriate. EEG data were recorded continuously and digitally stored.  Video monitoring was available and reviewed as appropriate.   Description: The posterior dominant rhythm consists of 8 Hz activity of moderate voltage (25-35 uV) seen predominantly in posterior head regions, asymmetric ( left<right) and reactive to eye opening and eye closing. Sleep was characterized by vertex waves, sleep spindles (12 to 14 Hz), maximal frontocentral region. Hyperventilation and photic stimulation were not performed. Lateralized periodic discharges with overriding fast activity were noted in left hemisphere maximal left temporal region at 0.5-1 Hz. EEG also showed continuous 3 to 6 Hz theta-delta slowing in left hemisphere.   ABNORMALITY - Lateralized periodic discharges with overriding fast activity ( LPD +F ) left hemisphere - Continuous slow, left hemisphere   IMPRESSION: This study showed evidence of epileptogenicity arising from left hemisphere which is on the ictal-interictal continuum with increased risk of seizure recurrence. Additionally there was cortical dysfunction in left  hemisphere likely secondary to underlying structural abnormality and seizures. No definite seizures were noted   Tramel Westbrook [name]

## 2023-08-09 NOTE — Progress Notes (Signed)
EEG maint complete. No skin breakdown at a1 c3 o1 SH GMD

## 2023-08-09 NOTE — Progress Notes (Signed)
LTM maint complete - no skin breakdown under:  Fp1 fp2 repaired Fz A2 and F8 Wrapped circumference of head with stretchy gauze. [facility] monitored, Event button test confirmed by [facility].

## 2023-08-09 NOTE — Progress Notes (Signed)
[facility] Progress Note Patient: Dana Morton XLK:[id number] DOB: [birth year] DOA: 08/06/2023  DOS: the patient was seen and examined on 08/09/2023  Brief Hospital Course: PMH of GBM diagnosed in November.  Diagnosed as the patient was having some speech difficulties and was seen by neurologist. 05/29/2023 underwent left temporal craniotomy for resection of the tumor with Dr. Adriana Simas at [facility]. Was following up with oncology. Due to transportation needs she completed her radiation with concurrent chemotherapy with Temodar at [facility]. Last session was 1/17. Presented with sudden onset of confusion followed by seizures on 1/22 at [facility] hospital. Found to have severe thrombocytopenia as well as seizures.  Neurology, hematology as well as neurosurgery were consulted in the ED at [facility]. Patient was on the list for transfer to [facility] but unable to get a bed. Due to urgency of the inpatient therapy needs patient was transferred to [facility] ED.  Started on LTM EEG monitoring on arrival and seizure medications were adjusted as she was found to have ongoing seizure activities based on EEG. Received 2 platelet transfusion on 1/22. 1 more platelet transfusion 1/24.  Received 2 more platelet transfusion on 1/24. Neurology and hematology following here. 1/26 2 platelets given, platelet count improved from 8-17, 1 more platelet ordered.  Assessment and Plan: Focal seizures. In the setting of underlying tumor. Appreciate neurology consultation. Continuing LTM EEG. Appears to have ongoing seizures while loaded with Keppra as well as some Vimpat. Currently Keppra dose was increased to 1500 mg twice daily. Also on Vimpat 200 mg twice daily. Received one-time dose of perampanel 8 mg per neurology. Monitor in progressive care unit for now. Able to maintain airway and able to communicate with waxing and waning mentation. EEG appears to show no evidence of ongoing seizures for  now.LTM EEG removed. Neurology signed off.  E. coli UTI. While patient does not have any fever and unable to report any signs of UTI given her clinical presentation we will continue with IV antibiotics to treat the UTI. Was on ceftriaxone.  Switch to cefadroxil.  Total treatment duration 7 days due to neutropenia.  Severe thrombocytopenia. Chemotherapy-induced pancytopenia. Some epistaxis noted on 1/24, now resolved. Thrombocytopenia most likely in the setting of concurrent use of radiation and Temodar. Hemolytic workup, LDH, bilirubin level all unremarkable.  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 B12 and folic acid normal. Appreciate hematology consultation. Transfuse for active bleeding with platelet count less than 50,000. Transfuse for platelet count less than 20,000 without active bleeding. Currently no approved indication for use of Nplate.  GBM. Recent diagnosis. Neurosurgery consulted at [facility] ED and recommended no intervention for surgery. Repeat MRI was motion degraded study although no definite mass or nodular involvement seen. MRI with and without contrast scheduled on 2/2 outpatient. Outpatient follow-up with [facility] oncologist recommended.  Normocytic anemia. Monitor for now. Supportive measures recommended. Given the patient has epistaxis transfuse for hemoglobin less than 8. Hematology ordered 1 dose of Procrit on 1/25.  Chemotherapy-induced neutropenia. ANC less than 1. If lower hematology considering G-CSF injection to keep ANC more than 1.  Hypothyroidism. TSH actually more than 10. Continue Synthroid.  Dose increased.  HTN. On bisoprolol at home. Also on chlorthalidone at home as well as lisinopril. Medications are currently on hold as the blood pressure is actually well-controlled right now. Diuretics were actually held as the patient had for AKI after receiving radiation. For now monitor closely.  GERD. Continue PPI.  HLD. On statin.  Will continue.  Goals of care  conversation. Extensive discussion with the family and the patient. Patient is unable to participate in the conversation. Family would like to continue full code for now. Patient does have a living well, recommended family to bring her to the hospital if possible to understand what the patient's actual wishes were.  Mild hypokalemia. Hypomagnesemia. Replaced Will monitor.  Type 2 diabetes mellitus. Hemoglobin A1c 6.7. On sliding scale insulin. Does not appear to carry the diagnosis prior to admission.  Foley catheter. Will attempt discontinue catheter tomorrow morning.  Subjective: Mentation improving.  No nausea no vomiting.  No fever no chills.  Wants to go home.  Reported some bilateral burning sensation on her foot.  Physical Exam: In mild distress. Fatigue. Clear to auscultation production bowel sound present.  Nontender. Peripheral pulses palpable bilaterally. No calf tenderness.  Data Reviewed: I have Reviewed nursing notes, Vitals, and Lab results. Discussed with hematology and neurology. Reviewed CBC and BMP. Reordered CBC and BMP.  Disposition: Status is: Inpatient Remains inpatient appropriate because: PT OT and platelet stabilization.  Place and maintain sequential compression device Start: 08/07/23 0327   Family Communication: Multiple family members at bedside. Level of care: Progressive   Vitals:   08/09/23 1420 08/09/23 1441 08/09/23 1442 08/09/23 1616  BP: (!) 144/56 136/66 136/66 (!) 141/56  Pulse: 86 81 81 87  Resp: 16 16 15 17   Temp: 98.5 F (36.9 C) 98.8 F (37.1 C) 98.8 F (37.1 C) 98.9 F (37.2 C)  TempSrc: Oral  Oral Oral  SpO2: 97%  96% 97%  Weight:      Height:       Author: Lynden Oxford, MD 08/09/2023 6:15 PM  Please look on [email or web address] to find out who is on call.

## 2023-08-09 NOTE — Progress Notes (Signed)
LTM EEG discontinued - no skin breakdown at [facility].

## 2023-08-09 NOTE — Plan of Care (Signed)
  Problem: Metabolic: Goal: Ability to maintain appropriate glucose levels will improve Outcome: Progressing   Problem: Education: Goal: Expressions of having a comfortable level of knowledge regarding the disease process will increase Outcome: Progressing   Problem: Coping: Goal: Ability to adjust to condition or change in health will improve Outcome: Progressing Goal: Ability to identify appropriate support needs will improve Outcome: Progressing   Problem: Clinical Measurements: Goal: Complications related to the disease process, condition or treatment will be avoided or minimized Outcome: Progressing   Problem: Safety: Goal: Verbalization of understanding the information provided will improve Outcome: Progressing   Problem: Self-Concept: Goal: Level of anxiety will decrease Outcome: Progressing Goal: Ability to verbalize feelings about condition will improve Outcome: Progressing

## 2023-08-09 NOTE — Evaluation (Signed)
Occupational Therapy Evaluation Patient Details Name: Dana Morton MRN: [id number] DOB: 12-26-1950 Today's Date: 08/09/2023   History of Present Illness Dana Morton is a 73 yo female who transferred from [facility] LTM EEG. She presented to [facility] 1/22 for AMS and seizures. Also found to have UTI. PMHx: left temporal lobe GBM IDH wild-type, WHO grade 4 s/p resection at [facility] last year, s/p radiation and chemotherapy with temozolamide, anemia, DM, HLD, HTN, hypothyroidism, pre-diabetes, sleep apnea and vitamin D deficiency   Clinical Impression   [name] was evaluated s/p the above admission list. She lives with her spouse and is indep at baseline. Upon evaluation the pt was limited by generalized weakness, impaired communication and limited activity tolerance. Overall she needed minA for bed mobility and min A to stand 2x from WOB with RW. Due to the deficits listed below the pt also needs up to mod A for LB ADLs and set up A for UB ADLs. Pt will benefit from continued acute OT services and HHOT with increased support from family.         If plan is discharge home, recommend the following: A little help with walking and/or transfers;A little help with bathing/dressing/bathroom;Assistance with cooking/housework;Direct supervision/assist for medications management;Direct supervision/assist for financial management;Assist for transportation;Help with stairs or ramp for entrance    Functional Status Assessment  Patient has had a recent decline in their functional status and demonstrates the ability to make significant improvements in function in a reasonable and predictable amount of time.  Equipment Recommendations  None recommended by OT       Precautions / Restrictions Precautions Precautions: Fall Precaution Comments: seizure Restrictions Weight Bearing Restrictions Per Provider Order: No      Mobility Bed Mobility Overal bed mobility: Needs Assistance Bed Mobility: Supine to Sit, Sit  to Supine     Supine to sit: Min assist Sit to supine: Min assist        Transfers Overall transfer level: Needs assistance Equipment used: Rolling walker (2 wheels) Transfers: Sit to/from Stand Sit to Stand: Min assist           General transfer comment: cues fro hand placement, incr time to rise      Balance Overall balance assessment: Needs assistance Sitting-balance support: Feet supported Sitting balance-Leahy Scale: Good     Standing balance support: Bilateral upper extremity supported, During functional activity Standing balance-Leahy Scale: Poor                             ADL either performed or assessed with clinical judgement   ADL Overall ADL's : Needs assistance/impaired Eating/Feeding: Independent;Sitting   Grooming: Set up;Sitting   Upper Body Bathing: Set up;Sitting   Lower Body Bathing: Minimal assistance;Sit to/from stand   Upper Body Dressing : Set up;Sitting   Lower Body Dressing: Sit to/from stand;Moderate assistance   Toilet Transfer: Contact guard assist;Ambulation;Rolling walker (2 wheels)   Toileting- Clothing Manipulation and Hygiene: Contact guard assist;Sitting/lateral lean       Functional mobility during ADLs: Minimal assistance;Rolling walker (2 wheels) General ADL Comments: incr time and effort needed for bed mobility and transfers. pt is limited by generalized weakness and impiared communication     Vision Baseline Vision/History: 1 Wears glasses Vision Assessment?: No apparent visual deficits     Perception Perception: Within Functional Limits       Praxis Praxis: Not tested       Pertinent Vitals/Pain Pain Assessment Pain  Assessment: Faces Faces Pain Scale: No hurt Pain Intervention(s): Monitored during session     Extremity/Trunk Assessment Upper Extremity Assessment Upper Extremity Assessment: Generalized weakness   Lower Extremity Assessment Lower Extremity Assessment: Defer to PT  evaluation   Cervical / Trunk Assessment Cervical / Trunk Assessment: Other exceptions Cervical / Trunk Exceptions: body habitus   Communication Communication Communication: Difficulty communicating thoughts/reduced clarity of speech   Cognition Arousal: Alert Behavior During Therapy: WFL for tasks assessed/performed Overall Cognitive Status: Difficult to assess                                 General Comments: follows simple 1 step commands. impaired communication, pt repeating short phrases or therapists words at times     General Comments  VSS, family present. Red.rase area noted on L thigh            Home Living Family/patient expects to be discharged to:: Private residence Living Arrangements: Spouse/significant other Available Help at Discharge: Family;Available 24 hours/day Type of Home: House Home Access: Stairs to enter [employer] of Steps: 3 Entrance Stairs-Rails: Right;Left Home Layout: One level     Bathroom Shower/Tub: Producer, television/film/video: Handicapped height     Home Equipment: Agricultural consultant (2 wheels);Rollator (4 wheels);Cane - single point;BSC/3in1;Shower seat;Wheelchair - manual;Cane - quad          Prior Functioning/Environment Prior Level of Function : Independent/Modified Independent             Mobility Comments: no AD ADLs Comments: indep ADLs, has not drive since recent sx        OT Problem List: Decreased strength;Decreased range of motion;Decreased activity tolerance;Impaired balance (sitting and/or standing);Decreased safety awareness;Decreased knowledge of precautions;Decreased knowledge of use of DME or AE      OT Treatment/Interventions: Self-care/ADL training;Therapeutic exercise;DME and/or AE instruction;Therapeutic activities;Patient/family education;Balance training    OT Goals(Current goals can be found in the care plan section) Acute Rehab OT Goals Patient Stated Goal: home OT  Goal Formulation: With patient Time For Goal Achievement: 08/23/23 Potential to Achieve Goals: Good ADL Goals Pt Will Perform Grooming: with modified independence;sitting;standing Pt Will Perform Lower Body Dressing: with supervision;sit to/from stand Pt Will Transfer to Toilet: ambulating;with supervision;bedside commode Additional ADL Goal #1: Pt will complete 8 minutes of OOB activity to demonstrate improved activity tolerance for ADLs  OT Frequency: Min 1X/week       AM-PAC OT "6 Clicks" Daily Activity     Outcome Measure Help from another person eating meals?: None Help from another person taking care of personal grooming?: A Little Help from another person toileting, which includes using toliet, bedpan, or urinal?: A Little Help from another person bathing (including washing, rinsing, drying)?: A Little Help from another person to put on and taking off regular upper body clothing?: A Little Help from another person to put on and taking off regular lower body clothing?: A Lot 6 Click Score: 18   End of Session Equipment Utilized During Treatment: Rolling walker (2 wheels) Nurse Communication: Mobility status (rash on thigh)  Activity Tolerance: Patient tolerated treatment well Patient left: in bed;with call bell/phone within reach;with bed alarm set;with family/visitor present  OT Visit Diagnosis: Unsteadiness on feet (R26.81);Other abnormalities of gait and mobility (R26.89);Muscle weakness (generalized) (M62.81)                Time: 1610-9604 OT Time Calculation (min): 26 min Charges:  OT General Charges $OT Visit: 1 Visit OT Evaluation $OT Eval Moderate Complexity: 1 Mod OT Treatments $Therapeutic Activity: 8-22 mins  Derenda Mis, OTR/L Acute Rehabilitation Services Office [phone] Secure Chat Communication Preferred   Donia Pounds 08/09/2023, 3:28 PM

## 2023-08-10 ENCOUNTER — Inpatient Hospital Stay (HOSPITAL_COMMUNITY): Payer: Medicare Other

## 2023-08-10 DIAGNOSIS — R569 Unspecified convulsions: Secondary | ICD-10-CM | POA: Diagnosis not present

## 2023-08-10 LAB — COMPREHENSIVE METABOLIC PANEL
ALT: 28 U/L (ref 0–44)
AST: 38 U/L (ref 15–41)
Albumin: 2.5 g/dL — ABNORMAL LOW (ref 3.5–5.0)
Alkaline Phosphatase: 61 U/L (ref 38–126)
Anion gap: 10 (ref 5–15)
BUN: 8 mg/dL (ref 8–23)
CO2: 23 mmol/L (ref 22–32)
Calcium: 8.4 mg/dL — ABNORMAL LOW (ref 8.9–10.3)
Chloride: 104 mmol/L (ref 98–111)
Creatinine, Ser: 0.9 mg/dL (ref 0.44–1.00)
GFR, Estimated: >60 mL/min (ref >60)
Glucose, Bld: 119 mg/dL — ABNORMAL HIGH (ref 70–99)
Potassium: 3.5 mmol/L (ref 3.5–5.1)
Sodium: 137 mmol/L (ref 135–145)
Total Bilirubin: 1 mg/dL (ref 0.0–1.2)
Total Protein: 6 g/dL — ABNORMAL LOW (ref 6.5–8.1)

## 2023-08-10 LAB — CBC
HCT: 25.9 % — ABNORMAL LOW (ref 36.0–46.0)
Hemoglobin: 9.1 g/dL — ABNORMAL LOW (ref 12.0–15.0)
MCH: 32.9 pg (ref 26.0–34.0)
MCHC: 35.1 g/dL (ref 30.0–36.0)
MCV: 93.5 fL (ref 80.0–100.0)
Platelets: 18 10*3/uL — CL (ref 150–400)
RBC: 2.77 MIL/uL — ABNORMAL LOW (ref 3.87–5.11)
RDW: 13.9 % (ref 11.5–15.5)
WBC: 2.4 10*3/uL — ABNORMAL LOW (ref 4.0–10.5)
nRBC: 0 % (ref 0.0–0.2)

## 2023-08-10 LAB — PREPARE PLATELET PHERESIS
Unit division: 0
Unit division: 0
Unit division: 0

## 2023-08-10 LAB — CBC WITH DIFFERENTIAL/PLATELET
Abs Immature Granulocytes: 0.1 10*3/uL — ABNORMAL HIGH (ref 0.00–0.07)
Basophils Absolute: 0 10*3/uL (ref 0.0–0.1)
Basophils Relative: 0 %
Eosinophils Absolute: 0 10*3/uL (ref 0.0–0.5)
Eosinophils Relative: 2 %
HCT: 22.4 % — ABNORMAL LOW (ref 36.0–46.0)
Hemoglobin: 7.8 g/dL — ABNORMAL LOW (ref 12.0–15.0)
Immature Granulocytes: 6 %
Lymphocytes Relative: 61 %
Lymphs Abs: 1 10*3/uL (ref 0.7–4.0)
MCH: 33.1 pg (ref 26.0–34.0)
MCHC: 34.8 g/dL (ref 30.0–36.0)
MCV: 94.9 fL (ref 80.0–100.0)
Monocytes Absolute: 0.1 10*3/uL (ref 0.1–1.0)
Monocytes Relative: 4 %
Neutro Abs: 0.5 10*3/uL — ABNORMAL LOW (ref 1.7–7.7)
Neutrophils Relative %: 27 %
Platelets: 9 10*3/uL — CL (ref 150–400)
RBC: 2.36 MIL/uL — ABNORMAL LOW (ref 3.87–5.11)
RDW: 13.5 % (ref 11.5–15.5)
Smear Review: DECREASED
WBC: 1.6 10*3/uL — ABNORMAL LOW (ref 4.0–10.5)
nRBC: 0 % (ref 0.0–0.2)

## 2023-08-10 LAB — CULTURE, BLOOD (ROUTINE X 2)
Culture: NO GROWTH
Culture: NO GROWTH
Special Requests: ADEQUATE

## 2023-08-10 LAB — BPAM PLATELET PHERESIS
Blood Product Expiration Date: 202501272359
Blood Product Expiration Date: 202501282359
Blood Product Expiration Date: 202501282359
ISSUE DATE / TIME: 202501261138
ISSUE DATE / TIME: 202501261414
ISSUE DATE / TIME: 202501261937
Unit Type and Rh: 600
Unit Type and Rh: 7300
Unit Type and Rh: 6200

## 2023-08-10 LAB — GLUCOSE, CAPILLARY
Glucose-Capillary: 104 mg/dL — ABNORMAL HIGH (ref 70–99)
Glucose-Capillary: 270 mg/dL — ABNORMAL HIGH (ref 70–99)
Glucose-Capillary: 172 mg/dL — ABNORMAL HIGH (ref 70–99)
Glucose-Capillary: 184 mg/dL — ABNORMAL HIGH (ref 70–99)

## 2023-08-10 LAB — PREPARE RBC (CROSSMATCH)

## 2023-08-10 LAB — MAGNESIUM: Magnesium: 1.4 mg/dL — ABNORMAL LOW (ref 1.7–2.4)

## 2023-08-10 MED ORDER — PERAMPANEL 2 MG PO TABS
2.0000 mg | ORAL_TABLET | Freq: Every day | ORAL | Status: DC
Start: 2023-08-11 — End: 2023-08-13
  Administered 2023-08-11: 2 mg via ORAL
  Filled 2023-08-10: qty 1

## 2023-08-10 MED ORDER — PERAMPANEL 8 MG PO TABS
8.0000 mg | ORAL_TABLET | Freq: Once | ORAL | Status: AC
  Administered 2023-08-10: 8 mg via ORAL
  Filled 2023-08-10: qty 1

## 2023-08-10 MED ORDER — SODIUM CHLORIDE 0.9% IV SOLUTION: Freq: Once | INTRAVENOUS | Status: AC

## 2023-08-10 MED ORDER — POTASSIUM CHLORIDE CRYS ER 20 MEQ PO TBCR
40.0000 meq | EXTENDED_RELEASE_TABLET | Freq: Once | ORAL | Status: AC
  Administered 2023-08-10: 40 meq via ORAL
  Filled 2023-08-10: qty 2

## 2023-08-10 MED ORDER — FILGRASTIM-AAFI 300 MCG/0.5ML IJ SOSY
300.0000 ug | PREFILLED_SYRINGE | Freq: Every day | INTRAMUSCULAR | Status: DC
  Administered 2023-08-10 – 2023-08-12 (×3): 300 ug via SUBCUTANEOUS
  Filled 2023-08-10 (×3): qty 0.5

## 2023-08-10 MED ORDER — LACOSAMIDE 200 MG PO TABS
200.0000 mg | ORAL_TABLET | Freq: Two times a day (BID) | ORAL | Status: DC
  Administered 2023-08-10 – 2023-08-12 (×4): 200 mg via ORAL
  Filled 2023-08-10 (×4): qty 1

## 2023-08-10 MED ORDER — LEVETIRACETAM 750 MG PO TABS
1500.0000 mg | ORAL_TABLET | Freq: Two times a day (BID) | ORAL | Status: DC
  Administered 2023-08-10 – 2023-08-12 (×4): 1500 mg via ORAL
  Filled 2023-08-10 (×4): qty 2

## 2023-08-10 MED ORDER — MAGNESIUM SULFATE 4 GM/100ML IV SOLN
4.0000 g | Freq: Once | INTRAVENOUS | Status: AC
  Administered 2023-08-10: 4 g via INTRAVENOUS
  Filled 2023-08-10: qty 100

## 2023-08-10 MED ORDER — PANTOPRAZOLE SODIUM 40 MG PO TBEC
40.0000 mg | DELAYED_RELEASE_TABLET | Freq: Every day | ORAL | Status: DC
  Administered 2023-08-11 – 2023-08-12 (×2): 40 mg via ORAL
  Filled 2023-08-10 (×2): qty 1

## 2023-08-10 NOTE — Progress Notes (Signed)
Occupational Therapy Treatment Patient Details Name: Dana Morton MRN: [id number] DOB: 1951/03/29 Today's Date: 08/10/2023   History of present illness Dana Morton is a 73 yo female who transferred from [facility] LTM EEG. She presented to [facility] 1/22 for AMS and seizures. Also found to have UTI. PMHx: left temporal lobe GBM IDH wild-type, WHO grade 4 s/p resection at [facility] last year, s/p radiation and chemotherapy with temozolamide, anemia, DM, HLD, HTN, hypothyroidism, pre-diabetes, sleep apnea and vitamin D deficiency   OT comments  Pt is making steady progress towards their acute OT goals. She continues to be limited by decreased activity tolerance, generalized weakness, word finding difficulties and impaired cognition. Overall she mobilized with CGA and RW, no over LOB noted but heavy use of UE support. Pt followed all 1 step commands and 2 step commands about 50% of the time, she was unable to complete dual tasking activities. Notable increase in speech difficulties with increased fatigue as session progressed. OT to continue to follow acutely to facilitate progress towards established goals. Pt will continue to benefit from [facility] - pt's daughter also hoping to set up [facility] aide for pt at d/c.       If plan is discharge home, recommend the following:  A little help with walking and/or transfers;A little help with bathing/dressing/bathroom;Assistance with cooking/housework;Direct supervision/assist for medications management;Direct supervision/assist for financial management;Assist for transportation;Help with stairs or ramp for entrance   Equipment Recommendations  None recommended by OT    Recommendations for Other Services Speech consult (speech/cognition evaluation)    Precautions / Restrictions Precautions Precautions: Fall Precaution Comments: seizure Restrictions Weight Bearing Restrictions Per Provider Order: No       Mobility Bed Mobility Overal bed mobility: Needs  Assistance Bed Mobility: Supine to Sit     Supine to sit: Supervision, HOB elevated, Used rails          Transfers Overall transfer level: Needs assistance Equipment used: Rolling walker (2 wheels) Transfers: Sit to/from Stand Sit to Stand: Contact guard assist           General transfer comment: slowed/deliberate gait. heavy use of RW. Pt states she would do better with her shoes     Balance Overall balance assessment: Needs assistance Sitting-balance support: Feet supported Sitting balance-Leahy Scale: Good     Standing balance support: Bilateral upper extremity supported, During functional activity Standing balance-Leahy Scale: Poor                             ADL either performed or assessed with clinical judgement   ADL Overall ADL's : Needs assistance/impaired                                       General ADL Comments: pt declined need for ADLs, per pt and family report she was able to complete toileting and hygiene in the bathroom today and groom at the sink without physical assist. This session was focused on dual tasking and cognitive assessment    Extremity/Trunk Assessment Upper Extremity Assessment Upper Extremity Assessment: Generalized weakness   Lower Extremity Assessment Lower Extremity Assessment: Defer to PT evaluation        Vision   Vision Assessment?: No apparent visual deficits Additional Comments: wears glasses   Perception Perception Perception: Within Functional Limits   Praxis Praxis Praxis: Not tested    Cognition  Arousal: Alert Behavior During Therapy: WFL for tasks assessed/performed Overall Cognitive Status: Difficult to assess                                 General Comments: continues to follow one step commands. pt is unable to dual task with walking and having a conversation. Pt has word finding difficulty that worsens with fatigue. Pt also becomes perseveratory on certain  phrases (ex. needing glasses and shoes), and is unable to be redirected at times        Exercises      Shoulder Instructions       General Comments VSS, daughter present    Pertinent Vitals/ Pain       Pain Assessment Pain Assessment: Faces Faces Pain Scale: No hurt Pain Intervention(s): Monitored during session  Home Living                                          Prior Functioning/Environment              Frequency  Min 1X/week        Progress Toward Goals  OT Goals(current goals can now be found in the care plan section)  Progress towards OT goals: Progressing toward goals  Acute Rehab OT Goals Patient Stated Goal: home OT Goal Formulation: With patient Time For Goal Achievement: 08/23/23 Potential to Achieve Goals: Good ADL Goals Pt Will Perform Grooming: with modified independence;sitting;standing Pt Will Perform Lower Body Dressing: with supervision;sit to/from stand Pt Will Transfer to Toilet: ambulating;with supervision;bedside commode Additional ADL Goal #1: Pt will complete 8 minutes of OOB activity to demonstrate improved activity tolerance for ADLs  Plan      Co-evaluation                 AM-PAC OT "6 Clicks" Daily Activity     Outcome Measure   Help from another person eating meals?: None Help from another person taking care of personal grooming?: A Little Help from another person toileting, which includes using toliet, bedpan, or urinal?: A Little Help from another person bathing (including washing, rinsing, drying)?: A Little Help from another person to put on and taking off regular upper body clothing?: A Little Help from another person to put on and taking off regular lower body clothing?: A Little 6 Click Score: 19    End of Session Equipment Utilized During Treatment: Rolling walker (2 wheels)  OT Visit Diagnosis: Unsteadiness on feet (R26.81);Other abnormalities of gait and mobility (R26.89);Muscle  weakness (generalized) (M62.81)   Activity Tolerance Patient tolerated treatment well   Patient Left in bed;with call bell/phone within reach;with bed alarm set;with family/visitor present   Nurse Communication Mobility status        Time: 1308-6578 OT Time Calculation (min): 17 min  Charges: OT General Charges $OT Visit: 1 Visit OT Treatments $Therapeutic Activity: 8-22 mins  .samn   Donia Pounds 08/10/2023, 10:40 AM

## 2023-08-10 NOTE — Hospital Course (Signed)
PMH of GBM diagnosed in November.  Diagnosed as the patient was having some speech difficulties and was seen by neurologist. 05/29/2023 underwent left temporal craniotomy for resection of the tumor with Dr. Adriana Simas at [facility]. Was following up with oncology. Due to transportation needs she completed her radiation with concurrent chemotherapy with Temodar at [facility]. Last session was 1/17. Presented with sudden onset of confusion followed by seizures on 1/22 at [facility] hospital. Found to have severe thrombocytopenia as well as seizures.  Neurology, hematology as well as neurosurgery were consulted in the ED at [facility]. Patient was on the list for transfer to [facility] but unable to get a bed. Due to urgency of the inpatient therapy needs patient was transferred to [facility] ED.  Started on LTM EEG monitoring on arrival and seizure medications were adjusted as she was found to have ongoing seizure activities based on EEG. Received 2 platelet transfusion on 1/22. 1 more platelet transfusion 1/24.  Received 2 more platelet transfusion on 1/24. Neurology and hematology following here. 1/26 2 platelets given, platelet count improved from 8-17, 1 more platelet ordered. 1/27 1 unit of platelet ordered.  Due to reoccurrence of speech difficulty, neurology was reconsulted, hooked up to LTM EEG again, given a dose of perampanel and started on scheduled maintenance dose.  Keppra and Vimpat switched to p.o.  Received 1 dose of G-CSF for neutropenia.  Received 1 PRBC as well.   Assessment and Plan: Focal seizures. In the setting of underlying tumor. Appreciate neurology consultation. Appears to have ongoing seizures while loaded with Keppra as well as some Vimpat. Currently Keppra dose was increased to 1500 mg twice daily. Also on Vimpat 200 mg twice daily. Started back on perampanel with no maintenance therapy. Will start back to LTM EEG. Monitor in progressive care unit for now. Able to  maintain airway and able to communicate with waxing and waning mentation. Neurology was reconsulted on 1/27 due to reoccurrence of speech difficulties.   E. coli UTI. While patient does not have any fever and unable to report any signs of UTI given her clinical presentation we will continue with IV antibiotics to treat the UTI. Was on ceftriaxone.  Switch to cefadroxil.  Total treatment duration 7 days due to neutropenia.   Severe thrombocytopenia. Chemotherapy-induced pancytopenia. Some epistaxis noted on 1/24, now resolved. Thrombocytopenia most likely in the setting of concurrent use of radiation and Temodar. Hemolytic workup, LDH, bilirubin level all unremarkable.  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 B12 and folic acid normal. Appreciate hematology consultation. Transfuse for active bleeding with platelet count less than 50,000. Transfuse for platelet count less than 20,000 without active bleeding. Currently no approved indication for use of Nplate.   GBM. Recent diagnosis. Neurosurgery consulted at [facility] ED and recommended no intervention for surgery. Repeat MRI was motion degraded study although no definite mass or nodular involvement seen. MRI with and without contrast scheduled on 2/2 outpatient. Outpatient follow-up with [facility] oncologist recommended.   Normocytic anemia. Monitor for now. Supportive measures recommended. 1 PRBC ordered on 1/27.  Hemoglobin 7.8. Procrit given on 1/25. Transfuse for hemoglobin less than 8.   Chemotherapy-induced neutropenia. ANC less than 1. 1 order of G-CSF on 1/27. Monitor CBC with differential.   Hypothyroidism. TSH actually more than 10. Continue Synthroid.  Dose increased from 137-150.   HTN. On bisoprolol at home. Also on chlorthalidone at home as well as lisinopril. Medications are currently on hold as the blood pressure is actually well-controlled right now. Diuretics  were actually held as the patient had for AKI after receiving radiation. For  now monitor closely.   GERD. Continue PPI.   HLD. On statin.  Will continue.   Goals of care conversation. Extensive discussion with the family and the patient. Patient is unable to participate in the conversation. Family would like to continue full code for now. Patient does have a living well, recommended family to bring her to the hospital if possible to understand what the patient's actual wishes were.   Mild hypokalemia. Recurrent hypomagnesemia. Replaced Will monitor.   Type 2 diabetes mellitus well-controlled without long-term insulin use. Hemoglobin A1c 6.7. On sliding scale insulin. Does not appear to carry the diagnosis prior to admission.   Foley catheter. Inserted as the patient was critically ill in the ER.  Discontinued 1/27.  Able to void.

## 2023-08-10 NOTE — Progress Notes (Signed)
Subjective: Patient states she is feeling well.  Per husband she does intermittently sound confused but significantly better compared to when she came to the hospital.  ROS: negative except above  Examination  Vital signs in last 24 hours: Temp:  [97.6 F (36.4 C)-99.6 F (37.6 C)] 98.5 F (36.9 C) (01/27 1201) Pulse Rate:  [80-106] 106 (01/27 1201) Resp:  [15-19] 18 (01/27 1201) BP: (125-151)/(53-66) 133/61 (01/27 1201) SpO2:  [94 %-97 %] 96 % (01/27 1201)  General: Sitting in chair, not in apparent distress Neuro: AOx3, no aphasia or dysarthria, able to follow simple one-step commands but not to do three-step commands, attention poor, (unable to tell me days of the week backwards), at times perseverates on the date, 5/5 in all 4 extremities without drift  Basic Metabolic Panel: Recent Labs  Lab 08/06/23 2242 08/07/23 0524 08/07/23 2011 08/08/23 0714 08/09/23 0711 08/10/23 0641  NA 140 140  --  141 136 137  K 3.1* 3.5  --  3.3* 4.0 3.5  CL 110 105  --  105 106 104  CO2 23 26  --  25 23 23   GLUCOSE 112* 119*  --  111* 125* 119*  BUN 15 15  --  10 10 8   CREATININE 0.89 0.93  --  0.92 0.96 0.90  CALCIUM 7.2* 8.1*  --  8.2* 8.5* 8.4*  MG  --  1.2* 1.8 1.6* 1.7 1.4*  PHOS  --   --   --  3.7  --   --     CBC: Recent Labs  Lab 08/07/23 0524 08/07/23 2011 08/08/23 0714 08/09/23 0711 08/09/23 1623 08/10/23 0641  WBC 3.0* 2.8* 2.0* 1.9* 1.7* 1.6*  NEUTROABS 1.7 1.7 1.0* 0.9*  --  0.5*  HGB 9.2* 8.8* 8.2* 8.4* 9.1* 7.8*  HCT 27.2* 26.0* 24.0* 24.1* 26.2* 22.4*  MCV 97.8 97.0 96.4 94.9 96.0 94.9  PLT 14* 11* 21* 8* 17* 9*     Coagulation Studies: No results for input(s): "LABPROT", "INR" in the last 72 hours.  Imaging: No new brain imaging   ASSESSMENT AND PLAN: 73 year old female with  PMHx of left temporal lobe GBM IDH wild-type, WHO grade 4 s/p resection at [facility] last year, s/p radiation and chemotherapy with temozolamide, anemia, DM, HLD, HTN, hypothyroidism,  pre-diabetes, sleep apnea and vitamin D deficiency who initiually presented to the [facility] ED on Wednesday morning with new onset of focal seizure activity. Family stated that the patient went to the restroom, came back to bed and began to have focal seizures manifesting as twitching of the right side of her face. She was also foaming at the mouth and unable to communicate. Due to recurrent seizures in the [facility] ED and waxing/waning mentation, she has been transferred to [facility] for LTM EEG.    Focal seizures -Seizures in the setting of underlying tumor, E coli UTI as well as hypomagnesemia (magnesium was 1.2 yesterday)   Recommendations -Will be hooked to EEG to see if symptoms are due to seizure recurrence versus prolonged postictal speech disturbance -Give one-time dose of perampanel 8 mg and start maintenance 2 mg nightly from tomorrow -Continue Keppra to 1500 mg twice daily and Vimpat 200 mg twice daily -Of note, platelets are very low therefore avoid Depakote -Discussed plan with Dr. Allena Katz via secure chat   I have spent a total of   36 minutes with the patient reviewing hospital notes,  test results, labs and examining the patient as well as establishing an assessment and plan.  >  50% of time was spent in direct patient care.    Lindie Spruce Epilepsy [facility] For questions after 5pm please refer to AMION to reach the Neurologist on call

## 2023-08-10 NOTE — Plan of Care (Signed)
  Problem: Fluid Volume: Goal: Ability to maintain a balanced intake and output will improve Outcome: Progressing   Problem: Metabolic: Goal: Ability to maintain appropriate glucose levels will improve Outcome: Progressing   Problem: Coping: Goal: Ability to adjust to condition or change in health will improve Outcome: Progressing   Problem: Health Behavior/Discharge Planning: Goal: Compliance with prescribed medication regimen will improve Outcome: Progressing   Problem: Medication: Goal: Risk for medication side effects will decrease Outcome: Progressing   Problem: Safety: Goal: Verbalization of understanding the information provided will improve Outcome: Progressing   Problem: Self-Concept: Goal: Level of anxiety will decrease Outcome: Progressing

## 2023-08-10 NOTE — Evaluation (Signed)
Physical Therapy Evaluation Patient Details Name: Dana Morton MRN: [id number] DOB: 1950/07/30 Today's Date: 08/10/2023  History of Present Illness  Dana Morton is a 73 yo female who transferred from [facility] LTM EEG. She presented to [facility] 1/22 for AMS and seizures. Also found to have UTI. PMHx: left temporal lobe GBM IDH wild-type, WHO grade 4 s/p resection at [facility] last year, s/p radiation and chemotherapy with temozolamide, anemia, DM, HLD, HTN, hypothyroidism, pre-diabetes, sleep apnea and vitamin D deficiency   Clinical Impression  Pt admitted with above diagnosis. PTA pt lived at home with her husband, independent mobility/ADLs without AD. Pt currently with functional limitations due to the deficits listed below (see PT Problem List). On eval, pt required CGA transfers, CG/min assist amb 110' with RW. Shoes were donned for mobility. Slow, guarded gait. No LOB. Heavy reliance on RW with trunk flexion. Poor follow through/understanding when cueing to correct posture/proximity to RW. Min assist for RW management during turns and maneuvering around obstacles. Inability to multi task. Attention and word finding difficulties worsen with fatigue. Pt will benefit from acute skilled PT to increase their independence and safety with mobility to allow discharge. Pt has 3 steps with bilat rails to access house. Upon d/c, pt would benefit from HHPT.           If plan is discharge home, recommend the following: A little help with walking and/or transfers;A little help with bathing/dressing/bathroom;Assistance with cooking/housework;Direct supervision/assist for medications management;Assist for transportation;Help with stairs or ramp for entrance   Can travel by private vehicle        Equipment Recommendations None recommended by PT  Recommendations for Other Services       Functional Status Assessment Patient has had a recent decline in their functional status and demonstrates the ability to  make significant improvements in function in a reasonable and predictable amount of time.     Precautions / Restrictions Precautions Precautions: Fall;Other (comment) Precaution Comments: seizure Restrictions Weight Bearing Restrictions Per Provider Order: No      Mobility  Bed Mobility               General bed mobility comments: Pt in recliner.    Transfers Overall transfer level: Needs assistance Equipment used: Rolling walker (2 wheels) Transfers: Sit to/from Stand Sit to Stand: Contact guard assist           General transfer comment: cues for sequencing/safety    Ambulation/Gait Ambulation/Gait assistance: Contact guard assist, Min assist Gait Distance (Feet): 110 Feet Assistive device: Rolling walker (2 wheels) Gait Pattern/deviations: Step-through pattern, Trunk flexed, Decreased stride length Gait velocity: decreased, guarded Gait velocity interpretation: <1.31 ft/sec, indicative of household ambulator   General Gait Details: Shoes donned for amb. Cues to stay close to RW with poor follow through/understanding. CGA for open, level surfaces. Assist with RW management during turns and maneuvering around obstacles.  Stairs            Wheelchair Mobility     Tilt Bed    Modified Rankin (Stroke Patients Only)       Balance Overall balance assessment: Needs assistance Sitting-balance support: Feet supported, No upper extremity supported Sitting balance-Leahy Scale: Good     Standing balance support: Bilateral upper extremity supported, During functional activity, Reliant on assistive device for balance Standing balance-Leahy Scale: Poor  Pertinent Vitals/Pain Pain Assessment Pain Assessment: Faces Faces Pain Scale: Hurts a little bit Pain Location: generalized Pain Descriptors / Indicators: Discomfort Pain Intervention(s): Monitored during session    Home Living Family/patient expects to be  discharged to:: Private residence Living Arrangements: Spouse/significant other Available Help at Discharge: Family;Available 24 hours/day Type of Home: House Home Access: Stairs to enter Entrance Stairs-Rails: Doctor, general practice of Steps: 3   Home Layout: One level Home Equipment: Agricultural consultant (2 wheels);Rollator (4 wheels);Cane - single point;BSC/3in1;Shower seat;Wheelchair - manual;Cane - quad Additional Comments: husband is w/c bound. He does drive    Prior Function Prior Level of Function : Independent/Modified Independent             Mobility Comments: no AD ADLs Comments: indep ADLs, has not drive since recent sx     Extremity/Trunk Assessment   Upper Extremity Assessment Upper Extremity Assessment: Defer to OT evaluation    Lower Extremity Assessment Lower Extremity Assessment: Generalized weakness    Cervical / Trunk Assessment Cervical / Trunk Assessment: Other exceptions Cervical / Trunk Exceptions: body habitus  Communication   Communication Communication: Difficulty communicating thoughts/reduced clarity of speech  Cognition Arousal: Alert Behavior During Therapy: WFL for tasks assessed/performed Overall Cognitive Status: Impaired/Different from baseline Area of Impairment: Attention, Memory, Following commands, Safety/judgement, Awareness, Problem solving                   Current Attention Level: Sustained Memory: Decreased short-term memory Following Commands: Follows one step commands with increased time, Follows one step commands consistently Safety/Judgement: Decreased awareness of deficits, Decreased awareness of safety Awareness: Emergent Problem Solving: Slow processing, Difficulty sequencing General Comments: Tangential. Difficulty staying on task. Word finding and attention worsen with fatigue.        General Comments General comments (skin integrity, edema, etc.): VSS, husband present    Exercises      Assessment/Plan    PT Assessment Patient needs continued PT services  PT Problem List Decreased strength;Decreased balance;Decreased cognition;Decreased knowledge of precautions;Decreased mobility;Decreased knowledge of use of DME;Decreased activity tolerance;Decreased safety awareness       PT Treatment Interventions DME instruction;Functional mobility training;Balance training;Patient/family education;Gait training;Therapeutic activities;Stair training;Therapeutic exercise;Cognitive remediation    PT Goals (Current goals can be found in the Care Plan section)  Acute Rehab PT Goals Patient Stated Goal: home PT Goal Formulation: With patient/family Time For Goal Achievement: 08/24/23 Potential to Achieve Goals: Good    Frequency Min 1X/week     Co-evaluation               AM-PAC PT "6 Clicks" Mobility  Outcome Measure Help needed turning from your back to your side while in a flat bed without using bedrails?: A Little Help needed moving from lying on your back to sitting on the side of a flat bed without using bedrails?: A Little Help needed moving to and from a bed to a chair (including a wheelchair)?: A Little Help needed standing up from a chair using your arms (e.g., wheelchair or bedside chair)?: A Little Help needed to walk in hospital room?: A Little Help needed climbing 3-5 steps with a railing? : Total 6 Click Score: 16    End of Session Equipment Utilized During Treatment: Gait belt Activity Tolerance: Patient tolerated treatment well Patient left: in chair;with call bell/phone within reach;with family/visitor present Nurse Communication: Mobility status PT Visit Diagnosis: Unsteadiness on feet (R26.81);Other abnormalities of gait and mobility (R26.89)    Time: 1203-1221 PT Time Calculation (min) (ACUTE  ONLY): 18 min   Charges:   PT Evaluation $PT Eval Moderate Complexity: 1 Mod   PT General Charges $$ ACUTE PT VISIT: 1 Visit         Ferd Glassing.,  PT  Office # [phone]   Ilda Foil 08/10/2023, 12:34 PM

## 2023-08-10 NOTE — TOC Initial Note (Signed)
Transition of Care [facility]) - Initial/Assessment Note    Patient Details  Name: Dana Morton MRN: [id number] Date of Birth: [birth year]  Transition of Care [facility] CM/SW Contact:    Kermit Balo, RN Phone Number: 08/10/2023, 11:18 AM  Clinical Narrative:                  Pt is from home with her spouse that is wheelchair bound. He still provides needed transportation.  Children live 45 min/ 3 hours away.  Home therapy arranged with [facility]. Information on the AVS. [facility] will contact her for the first home visit. Daughter was asking for assistance at home. Pts insurance doesn't cover caregivers. CM has encouraged her to call pts insurance to see if they can do meals at home after d/c and provide some assistance with transportation. CM has provided her with Meals on Wheels information in [place]. Pt has transportation home when medically ready.    Expected Discharge Plan: Home w Home Health Services Barriers to Discharge: Continued Medical Work up   Patient Goals and CMS Choice   CMS Medicare.gov Compare Post Acute Care list provided to:: Patient Choice offered to / list presented to : Patient, Adult Children      Expected Discharge Plan and Services   Discharge Planning Services: CM Consult Post Acute Care Choice: Home Health Living arrangements for the past 2 months: Single Family Home                           HH Arranged: PT, OT, Speech Therapy HH Agency: [facility] Date [facility] Agency Contacted: 08/10/23   Representative spoke with at [facility] Agency: Tresa Endo  Prior Living Arrangements/Services Living arrangements for the past 2 months: Single Family Home Lives with:: Spouse Patient language and need for interpreter reviewed:: Yes            Current home services: DME Adult nurse (2 wheels);Rollator (4 wheels);Cane - single point;BSC/3in1;Shower seat;Wheelchair - manual;Cane - quad) Criminal Activity/Legal Involvement Pertinent to Current  Situation/Hospitalization: No - Comment as needed  Activities of Daily Living   ADL Screening (condition at time of admission) Independently performs ADLs?: Yes (appropriate for developmental age) Is the patient deaf or have difficulty hearing?: No Does the patient have difficulty seeing, even when wearing glasses/contacts?: No Does the patient have difficulty concentrating, remembering, or making decisions?: Yes  Permission Sought/Granted                  Emotional Assessment Appearance:: Appears stated age Attitude/Demeanor/Rapport: Engaged (dysarthria) Affect (typically observed): Accepting Orientation: : Oriented to Self, Oriented to Place, Oriented to Situation   Psych Involvement: No (comment)  Admission diagnosis:  Seizure (HCC) [R56.9] Low platelet count [facility]) [D69.6] Patient Active Problem List   Diagnosis Date Noted   Seizure (HCC) 08/07/2023   Acute cystitis 08/07/2023   Thrombocytopenia (HCC) 08/06/2023   Glioblastoma multiforme (HCC) 08/06/2023   Aphasia 05/12/2023   Mass of brain 05/12/2023   S/P total knee arthroplasty 09/23/2017   Diabetes mellitus type 2, uncomplicated (HCC) 09/22/2017   Hypertension 09/22/2017   Hypothyroidism, unspecified 09/22/2017   Vitamin D deficiency, unspecified 09/22/2017   Edema of both legs 03/03/2017   BMI 40.0-44.9, adult (HCC) 12/03/2015   Pure hypercholesterolemia 05/26/2014   PCP:  Lynnea Ferrier, MD Pharmacy:   [facility] [phone] [address], [place] - [address] [place] [address] Phone: [phone] Fax: [phone]  Social Drivers of Health (SDOH) Social History: SDOH Screenings   Food Insecurity: No Food Insecurity (08/08/2023)  Housing: Low Risk  (08/08/2023)  Transportation Needs: No Transportation Needs (08/08/2023)  Utilities: Not At Risk (08/08/2023)  Financial Resource Strain: Patient Declined (06/23/2023)   Received from [facility]  Social  Connections: Moderately Isolated (08/08/2023)  Tobacco Use: Low Risk  (08/07/2023)   SDOH Interventions:     Readmission Risk Interventions     No data to display

## 2023-08-10 NOTE — Progress Notes (Signed)
LTM EEG hooked up. CT wires used. [facility] monitoring. Test button tested.

## 2023-08-10 NOTE — Progress Notes (Signed)
[facility] Progress Note Patient: Dana Morton ZOX:[id number] DOB: 07-13-1951 DOA: 08/06/2023  DOS: the patient was seen and examined on 08/10/2023  Brief Hospital Course: PMH of GBM diagnosed in November.  Diagnosed as the patient was having some speech difficulties and was seen by neurologist. 05/29/2023 underwent left temporal craniotomy for resection of the tumor with Dr. Adriana Simas at [facility]. Was following up with oncology. Due to transportation needs she completed her radiation with concurrent chemotherapy with Temodar at [facility]. Last session was 1/17. Presented with sudden onset of confusion followed by seizures on 1/22 at [facility] hospital. Found to have severe thrombocytopenia as well as seizures.  Neurology, hematology as well as neurosurgery were consulted in the ED at [facility]. Patient was on the list for transfer to [facility] but unable to get a bed. Due to urgency of the inpatient therapy needs patient was transferred to [facility] ED.  Started on LTM EEG monitoring on arrival and seizure medications were adjusted as she was found to have ongoing seizure activities based on EEG. Received 2 platelet transfusion on 1/22. 1 more platelet transfusion 1/24.  Received 2 more platelet transfusion on 1/24. Neurology and hematology following here. 1/26 2 platelets given, platelet count improved from 8-17, 1 more platelet ordered. 1/27 1 unit of platelet ordered.  Due to reoccurrence of speech difficulty, neurology was reconsulted, hooked up to LTM EEG again, given a dose of perampanel and started on scheduled maintenance dose.  Keppra and Vimpat switched to p.o.  Received 1 dose of G-CSF for neutropenia.  Received 1 PRBC as well.   Assessment and Plan: Focal seizures. In the setting of underlying tumor. Appreciate neurology consultation. Appears to have ongoing seizures while loaded with Keppra as well as some Vimpat. Currently Keppra dose was increased to 1500 mg twice  daily. Also on Vimpat 200 mg twice daily. Started back on perampanel with no maintenance therapy. Will start back to LTM EEG. Monitor in progressive care unit for now. Able to maintain airway and able to communicate with waxing and waning mentation. Neurology was reconsulted on 1/27 due to reoccurrence of speech difficulties.   E. coli UTI. While patient does not have any fever and unable to report any signs of UTI given her clinical presentation we will continue with IV antibiotics to treat the UTI. Was on ceftriaxone.  Switch to cefadroxil.  Total treatment duration 7 days due to neutropenia.   Severe thrombocytopenia. Chemotherapy-induced pancytopenia. Some epistaxis noted on 1/24, now resolved. Thrombocytopenia most likely in the setting of concurrent use of radiation and Temodar. Hemolytic workup, LDH, bilirubin level all unremarkable.  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 B12 and folic acid normal. Appreciate hematology consultation. Transfuse for active bleeding with platelet count less than 50,000. Transfuse for platelet count less than 20,000 without active bleeding. Currently no approved indication for use of Nplate.   GBM. Recent diagnosis. Neurosurgery consulted at [facility] ED and recommended no intervention for surgery. Repeat MRI was motion degraded study although no definite mass or nodular involvement seen. MRI with and without contrast scheduled on 2/2 outpatient. Outpatient follow-up with [facility] oncologist recommended.   Normocytic anemia. Monitor for now. Supportive measures recommended. 1 PRBC ordered on 1/27.  Hemoglobin 7.8. Procrit given on 1/25. Transfuse for hemoglobin less than 8.   Chemotherapy-induced neutropenia. ANC less than 1. 1 order of G-CSF on 1/27. Monitor CBC with differential.   Hypothyroidism. TSH actually more than 10. Continue Synthroid.  Dose increased from 137-150.   HTN. On  bisoprolol at home. Also on chlorthalidone at home as well as  lisinopril. Medications are currently on hold as the blood pressure is actually well-controlled right now. Diuretics were actually held as the patient had for AKI after receiving radiation. For now monitor closely.   GERD. Continue PPI.   HLD. On statin.  Will continue.   Goals of care conversation. Extensive discussion with the family and the patient. Patient is unable to participate in the conversation. Family would like to continue full code for now. Patient does have a living well, recommended family to bring her to the hospital if possible to understand what the patient's actual wishes were.   Mild hypokalemia. Recurrent hypomagnesemia. Replaced Will monitor.   Type 2 diabetes mellitus well-controlled without long-term insulin use. Hemoglobin A1c 6.7. On sliding scale insulin. Does not appear to carry the diagnosis prior to admission.   Foley catheter. Inserted as the patient was critically ill in the ER.  Discontinued 1/27.  Able to void.   Subjective: No nausea no vomiting no fever no chills.  Unable to answer questions clearly.  Tangential thoughts.  Unable to follow commands easily.  No dysarthria though speech is clear.  Physical Exam: General: in Mild distress, No Rash Cardiovascular: S1 and S2 Present, No Murmur Respiratory: Good respiratory effort, Bilateral Air entry present. No Crackles, No wheezes Abdomen: Bowel Sound present, No tenderness Extremities: No edema Neuro: Alert and oriented x3, no new focal deficit, unable to answer questions clearly, unable to follow commands, no dysarthria.  Data Reviewed: I have Reviewed nursing notes, Vitals, and Lab results. Since last encounter, pertinent lab results CBC and BMP   . I have ordered test including CBC and CMP  . I have discussed pt's care plan and test results with hematology and neurology  .   Disposition: Status is: Inpatient Remains inpatient appropriate because: Monitor for improvement in  mentation  Place and maintain sequential compression device Start: 08/07/23 0327   Family Communication: Family at bedside Level of care: Progressive   Vitals:   08/10/23 1529 08/10/23 1610 08/10/23 1616 08/10/23 1632  BP: (!) 116/42 (!) 88/43 (!) 120/50 (!) 137/56  Pulse: 89 90  95  Resp: (!) 0 (!) 28  (!) 28  Temp: 98.2 F (36.8 C) 98.5 F (36.9 C)  99.6 F (37.6 C)  TempSrc: Oral Oral    SpO2: 95% 94%  98%  Weight:      Height:         Author: Lynden Oxford, MD 08/10/2023 5:30 PM  Please look on [email or web address] to find out who is on call.

## 2023-08-11 DIAGNOSIS — R569 Unspecified convulsions: Secondary | ICD-10-CM | POA: Diagnosis not present

## 2023-08-11 LAB — GLUCOSE, CAPILLARY
Glucose-Capillary: 140 mg/dL — ABNORMAL HIGH (ref 70–99)
Glucose-Capillary: 153 mg/dL — ABNORMAL HIGH (ref 70–99)
Glucose-Capillary: 131 mg/dL — ABNORMAL HIGH (ref 70–99)
Glucose-Capillary: 165 mg/dL — ABNORMAL HIGH (ref 70–99)

## 2023-08-11 LAB — CBC WITH DIFFERENTIAL/PLATELET
Abs Immature Granulocytes: 0.03 10*3/uL (ref 0.00–0.07)
Abs Immature Granulocytes: 0 10*3/uL (ref 0.00–0.07)
Basophils Absolute: 0 10*3/uL (ref 0.0–0.1)
Basophils Absolute: 0 10*3/uL (ref 0.0–0.1)
Basophils Relative: 0 %
Basophils Relative: 0 %
Eosinophils Absolute: 0 10*3/uL (ref 0.0–0.5)
Eosinophils Absolute: 0 10*3/uL (ref 0.0–0.5)
Eosinophils Relative: 2 %
Eosinophils Relative: 0 %
HCT: 25.4 % — ABNORMAL LOW (ref 36.0–46.0)
HCT: 26.2 % — ABNORMAL LOW (ref 36.0–46.0)
Hemoglobin: 9 g/dL — ABNORMAL LOW (ref 12.0–15.0)
Hemoglobin: 9.2 g/dL — ABNORMAL LOW (ref 12.0–15.0)
Immature Granulocytes: 2 %
Lymphocytes Relative: 46 %
Lymphocytes Relative: 66 %
Lymphs Abs: 0.9 10*3/uL (ref 0.7–4.0)
Lymphs Abs: 1 10*3/uL (ref 0.7–4.0)
MCH: 33.1 pg (ref 26.0–34.0)
MCH: 32.6 pg (ref 26.0–34.0)
MCHC: 35.4 g/dL (ref 30.0–36.0)
MCHC: 35.1 g/dL (ref 30.0–36.0)
MCV: 93.4 fL (ref 80.0–100.0)
MCV: 92.9 fL (ref 80.0–100.0)
Monocytes Absolute: 0.1 10*3/uL (ref 0.1–1.0)
Monocytes Absolute: 0 10*3/uL — ABNORMAL LOW (ref 0.1–1.0)
Monocytes Relative: 5 %
Monocytes Relative: 1 %
Neutro Abs: 0.9 10*3/uL — ABNORMAL LOW (ref 1.7–7.7)
Neutro Abs: 0.5 10*3/uL — ABNORMAL LOW (ref 1.7–7.7)
Neutrophils Relative %: 45 %
Neutrophils Relative %: 33 %
Platelets: 12 10*3/uL — CL (ref 150–400)
Platelets: 16 10*3/uL — CL (ref 150–400)
RBC: 2.72 MIL/uL — ABNORMAL LOW (ref 3.87–5.11)
RBC: 2.82 MIL/uL — ABNORMAL LOW (ref 3.87–5.11)
RDW: 14.1 % (ref 11.5–15.5)
RDW: 14 % (ref 11.5–15.5)
Smear Review: DECREASED
WBC: 1.9 10*3/uL — ABNORMAL LOW (ref 4.0–10.5)
WBC: 1.5 10*3/uL — ABNORMAL LOW (ref 4.0–10.5)
nRBC: 0 % (ref 0.0–0.2)
nRBC: 0 % (ref 0.0–0.2)

## 2023-08-11 LAB — TYPE AND SCREEN
ABO/RH(D): O POS
Antibody Screen: NEGATIVE
Unit division: 0

## 2023-08-11 LAB — BPAM PLATELET PHERESIS
Blood Product Expiration Date: 202501282359
ISSUE DATE / TIME: 202501271322
Unit Type and Rh: 6200

## 2023-08-11 LAB — BPAM RBC
Blood Product Expiration Date: 202502032359
ISSUE DATE / TIME: 202501271602
Unit Type and Rh: 5100

## 2023-08-11 LAB — PREPARE PLATELET PHERESIS: Unit division: 0

## 2023-08-11 MED ORDER — SODIUM CHLORIDE 0.9% IV SOLUTION: Freq: Once | INTRAVENOUS | Status: AC

## 2023-08-11 MED ORDER — SODIUM CHLORIDE 0.9 % IV SOLN
2.0000 g | Freq: Three times a day (TID) | INTRAVENOUS | Status: DC
  Administered 2023-08-11 – 2023-08-12 (×4): 2 g via INTRAVENOUS
  Filled 2023-08-11 (×4): qty 12.5

## 2023-08-11 NOTE — Progress Notes (Signed)
LTM EEG D/C'd. Patient had no skin break down. [facility] notified.

## 2023-08-11 NOTE — Progress Notes (Signed)
Subjective: No acute events overnight.  Denies any concerns.  ROS: negative except above  Examination  Vital signs in last 24 hours: Temp:  [97.9 F (36.6 C)-100.4 F (38 C)] 98.4 F (36.9 C) (01/28 0333) Pulse Rate:  [87-103] 87 (01/28 0333) Resp:  [0-28] 16 (01/28 0333) BP: (88-161)/(42-63) 156/63 (01/28 0333) SpO2:  [93 %-98 %] 93 % (01/28 0333)  General: Sitting in chair, not in apparent distress Neuro: AOx3, no aphasia or dysarthria, able to follow two-step commands, unable to do serial sevens, 5/5 in all 4 extremities without drift.  At times patient will perseverate on numbers   Basic Metabolic Panel: Recent Labs  Lab 08/06/23 2242 08/07/23 0524 08/07/23 2011 08/08/23 0714 08/09/23 0711 08/10/23 0641  NA 140 140  --  141 136 137  K 3.1* 3.5  --  3.3* 4.0 3.5  CL 110 105  --  105 106 104  CO2 23 26  --  25 23 23   GLUCOSE 112* 119*  --  111* 125* 119*  BUN 15 15  --  10 10 8   CREATININE 0.89 0.93  --  0.92 0.96 0.90  CALCIUM 7.2* 8.1*  --  8.2* 8.5* 8.4*  MG  --  1.2* 1.8 1.6* 1.7 1.4*  PHOS  --   --   --  3.7  --   --     CBC: Recent Labs  Lab 08/07/23 2011 08/08/23 0714 08/09/23 0711 08/09/23 1623 08/10/23 0641 08/10/23 1946 08/11/23 0518  WBC 2.8* 2.0* 1.9* 1.7* 1.6* 2.4* 1.9*  NEUTROABS 1.7 1.0* 0.9*  --  0.5*  --  0.9*  HGB 8.8* 8.2* 8.4* 9.1* 7.8* 9.1* 9.0*  HCT 26.0* 24.0* 24.1* 26.2* 22.4* 25.9* 25.4*  MCV 97.0 96.4 94.9 96.0 94.9 93.5 93.4  PLT 11* 21* 8* 17* 9* 18* 12*     Coagulation Studies: No results for input(s): "LABPROT", "INR" in the last 72 hours.  Imaging No new brain imaging overnight  ASSESSMENT AND PLAN: 73 year old female with  PMHx of left temporal lobe GBM IDH wild-type, WHO grade 4 s/p resection at [facility] last year, s/p radiation and chemotherapy with temozolamide, anemia, DM, HLD, HTN, hypothyroidism, pre-diabetes, sleep apnea and vitamin D deficiency who initiually presented to the [facility] ED on Wednesday morning with new  onset of focal seizure activity. Family stated that the patient went to the restroom, came back to bed and began to have focal seizures manifesting as twitching of the right side of her face. She was also foaming at the mouth and unable to communicate. Due to recurrent seizures in the [facility] ED and waxing/waning mentation, she has been transferred to [facility] for LTM EEG.    Focal seizures Speech disturbance -Seizures in the setting of underlying tumor, E coli UTI as well as hypomagnesemia (magnesium was 1.2) -Patient still has some speech disturbance, poor attention span and at times perseverates.  She does have lateralized periodic discharges on EEG but the frequency and morphology has significantly improved and no seizures were noted.  Therefore I am hesitant to continue to increase antiseizure medications as it can lead to excessive sedation   Recommendations -DC LTM EEG -Continue Keppra to 1500 mg twice daily,  Vimpat 200 mg twice daily and perampanel 2 mg daily --If tolerated and still has speech difficulties, can increase perampanel to 4 mg -Of note, platelets are very low therefore avoid Depakote -Discussed plan with Dr. Allena Katz via secure chat   I have spent a total of  35 minutes with the patient reviewing hospital notes,  test results, labs and examining the patient as well as establishing an assessment and plan.  > 50% of time was spent in direct patient care.    Lindie Spruce Epilepsy [facility] For questions after 5pm please refer to AMION to reach the Neurologist on call

## 2023-08-11 NOTE — Procedures (Addendum)
Patient Name: Dana Morton  MRN: [id number]  Epilepsy Attending: Charlsie Quest  Referring Physician/Provider: Caryl Pina, MD  Duration: 08/10/2023 1451 to 08/11/2023 0930   Patient history: 73 y.o. female with a PMHx of left temporal lobe GBM IDH wild-type, WHO grade 4 s/p resection at [facility] last year, s/p radiation and chemotherapy with temozolamide who initiually presented with new onset of focal seizure activity. EEG to evaluate for seizure   Level of alertness: awake, asleep    AEDs during EEG study: LEV, LCM, Perampanel   Technical aspects: This EEG study was done with scalp electrodes positioned according to the 10-20 International system of electrode placement. Electrical activity was reviewed with band pass filter of 1-70Hz , sensitivity of 7 uV/mm, display speed of 21mm/sec with a 60Hz  notched filter applied as appropriate. EEG data were recorded continuously and digitally stored.  Video monitoring was available and reviewed as appropriate.   Description: The posterior dominant rhythm consists of 8 Hz activity of moderate voltage (25-35 uV) seen predominantly in posterior head regions, asymmetric ( left<right) and reactive to eye opening and eye closing. Sleep was characterized by vertex waves, sleep spindles (12 to 14 Hz), maximal frontocentral region. Hyperventilation and photic stimulation were not performed. Lateralized periodic discharges were noted in left hemisphere maximal left temporal region at 0.5-1 Hz. EEG also showed continuous 3 to 6 Hz theta-delta slowing in left hemisphere.   ABNORMALITY - Lateralized periodic discharges ( LPD) left hemisphere - Continuous slow, left hemisphere   IMPRESSION: This study showed evidence of epileptogenicity and cortical dysfunction arising from left hemisphere likely secondary to underlying structural abnormality. No definite seizures were noted   [name]

## 2023-08-11 NOTE — Progress Notes (Signed)
Physical Therapy Treatment Patient Details Name: Dana Morton MRN: [id number] DOB: [birth year] Today's Date: 08/11/2023   History of Present Illness Dana Morton is a 73 yo female who transferred from [facility] LTM EEG. She presented to [facility] 1/22 for AMS and seizures. Also found to have UTI. PMHx: left temporal lobe GBM IDH wild-type, WHO grade 4 s/p resection at [facility] last year, s/p radiation and chemotherapy with temozolamide, anemia, DM, HLD, HTN, hypothyroidism, pre-diabetes, sleep apnea and vitamin D deficiency    PT Comments  Pt resting in bed on arrival and agreeable to session with continued progress towards acute goals. Pt able to complete bed mobility at supervision level and transfers and gait with RW for support with grossly CGA for safety. Pt demonstrating increased activity tolerance this session, progressing gait distance and able to complete serial sit<>stands at end of session for LE strength maintenance. Pt continues to require repetitive cues for posture correction and closer RW proximity during gait. Pt able to dual task during gait however needing cues to remain on task with increased fatigue. Pt continues to benefit from skilled PT services to progress toward functional mobility goals.     If plan is discharge home, recommend the following: A little help with walking and/or transfers;A little help with bathing/dressing/bathroom;Assistance with cooking/housework;Direct supervision/assist for medications management;Assist for transportation;Help with stairs or ramp for entrance   Can travel by private vehicle        Equipment Recommendations  None recommended by PT    Recommendations for Other Services       Precautions / Restrictions Precautions Precautions: Fall;Other (comment) Precaution Comments: seizure Restrictions Weight Bearing Restrictions Per Provider Order: No     Mobility  Bed Mobility Overal bed mobility: Needs Assistance Bed Mobility: Supine to Sit,  Sit to Supine     Supine to sit: Supervision, HOB elevated, Used rails Sit to supine: Min assist   General bed mobility comments: light min A to return LEs to bed at end of session    Transfers Overall transfer level: Needs assistance Equipment used: Rolling walker (2 wheels) Transfers: Sit to/from Stand Sit to Stand: Contact guard assist           General transfer comment: cues for sequencing/safety, able to complete x5 at end of session for reinforcement of hand palcement    Ambulation/Gait Ambulation/Gait assistance: Contact guard assist Gait Distance (Feet): 200 Feet Assistive device: Rolling walker (2 wheels) Gait Pattern/deviations: Step-through pattern, Trunk flexed, Decreased stride length Gait velocity: decreased, guarded     General Gait Details: Shoes donned for amb. cues for upright posture and closer RW proximity, HR up to 133bpm with activity   Stairs             Wheelchair Mobility     Tilt Bed    Modified Rankin (Stroke Patients Only)       Balance Overall balance assessment: Needs assistance Sitting-balance support: Feet supported, No upper extremity supported Sitting balance-Leahy Scale: Good     Standing balance support: Bilateral upper extremity supported, During functional activity, Reliant on assistive device for balance Standing balance-Leahy Scale: Poor Standing balance comment: reliant on UE support during dynamic tasks                            Cognition Arousal: Alert Behavior During Therapy: WFL for tasks assessed/performed Overall Cognitive Status: Impaired/Different from baseline Area of Impairment: Attention, Memory, Following commands, Safety/judgement, Awareness, Problem solving  Current Attention Level: Sustained   Following Commands: Follows one step commands consistently Safety/Judgement: Decreased awareness of deficits Awareness: Emergent Problem Solving: Slow processing,  Difficulty sequencing General Comments: Tangential. Word finding and attention worsen with fatigue. able to recall PT session from previous day        Exercises Other Exercises Other Exercises: serial sit<>stand x5 for reinforcement of hand placement and LE strength    General Comments General comments (skin integrity, edema, etc.): VSS, son present and supportive      Pertinent Vitals/Pain Pain Assessment Pain Assessment: No/denies pain Pain Intervention(s): Monitored during session    Home Living                          Prior Function            PT Goals (current goals can now be found in the care plan section) Acute Rehab PT Goals Patient Stated Goal: home PT Goal Formulation: With patient/family Time For Goal Achievement: 08/24/23 Progress towards PT goals: Progressing toward goals    Frequency    Min 1X/week      PT Plan      Co-evaluation              AM-PAC PT "6 Clicks" Mobility   Outcome Measure  Help needed turning from your back to your side while in a flat bed without using bedrails?: A Little Help needed moving from lying on your back to sitting on the side of a flat bed without using bedrails?: A Little Help needed moving to and from a bed to a chair (including a wheelchair)?: A Little Help needed standing up from a chair using your arms (e.g., wheelchair or bedside chair)?: A Little Help needed to walk in hospital room?: A Little Help needed climbing 3-5 steps with a railing? : Total 6 Click Score: 16    End of Session Equipment Utilized During Treatment: Gait belt Activity Tolerance: Patient tolerated treatment well Patient left: with call bell/phone within reach;in bed;with bed alarm set;with family/visitor present Nurse Communication: Mobility status PT Visit Diagnosis: Unsteadiness on feet (R26.81);Other abnormalities of gait and mobility (R26.89)     Time: 1610-9604 PT Time Calculation (min) (ACUTE ONLY): 15  min  Charges:    $Gait Training: 8-22 mins PT General Charges $$ ACUTE PT VISIT: 1 Visit                     Masha Orbach R. PTA Acute Rehabilitation Services Office: [phone]   Catalina Antigua 08/11/2023, 12:10 PM

## 2023-08-11 NOTE — Progress Notes (Signed)
[facility] Progress Note Patient: Dana Morton ZOX:[id number] DOB: 14-Nov-1950 DOA: 08/06/2023  DOS: the patient was seen and examined on 08/11/2023  Brief Hospital Course: PMH of GBM diagnosed in November.  Diagnosed as the patient was having some speech difficulties and was seen by neurologist. 05/29/2023 underwent left temporal craniotomy for resection of the tumor with Dr. Adriana Simas at [facility]. Was following up with oncology. Due to transportation needs she completed her radiation with concurrent chemotherapy with Temodar at [facility]. Last session was 1/17. Presented with sudden onset of confusion followed by seizures on 1/22 at [facility] hospital. Found to have severe thrombocytopenia as well as seizures.  Neurology, hematology as well as neurosurgery were consulted in the ED at [facility]. Patient was on the list for transfer to [facility] but unable to get a bed. Due to urgency of the inpatient therapy needs patient was transferred to [facility] ED.  Started on LTM EEG monitoring on arrival and seizure medications were adjusted as she was found to have ongoing seizure activities based on EEG. Received 2 platelet transfusion on 1/22. 1 more platelet transfusion 1/24.  Received 2 more platelet transfusion on 1/24. Neurology and hematology following here. 1/26 2 platelets given, platelet count improved from 8-17, 1 more platelet ordered. 1/27 1 unit of platelet ordered.  Due to reoccurrence of speech difficulty, neurology was reconsulted, hooked up to LTM EEG again, given a dose of perampanel and started on scheduled maintenance dose.  Keppra and Vimpat switched to p.o. started on G-CSF for neutropenia.  Received 1 PRBC as well. 1/28 2 more platelets ordered.  Neuro signed off.  LTM EEG discontinued.  Due to fever blood cultures performed and started on IV cefepime for concern for febrile neutropenia.   Assessment and Plan: Focal seizures. In the setting of underlying  tumor. Appreciate neurology consultation. Appears to have ongoing seizures while loaded with Keppra as well as some Vimpat. Currently Keppra dose was increased to 1500 mg twice daily. Also on Vimpat 200 mg twice daily. Received 1 dose of perampanel earlier.  Due to ongoing confusion neuro was reconsulted on 1/27 and patient is started again on perampanel maintenance therapy. Monitor in progressive care unit for now. Able to maintain airway and able to communicate with waxing and waning mentation.   E. coli UTI. Concern for febrile neutropenia No fever or UTI symptoms prior to admission. Was receiving IV antibiotics for E. coli UTI. Then switched to oral antibiotic. Developed fever again and due to severe neutropenia currently receiving IV cefepime for concerns for febrile neutropenia.   Severe thrombocytopenia. Chemotherapy-induced pancytopenia. Some epistaxis noted on 1/24, now resolved. Thrombocytopenia most likely in the setting of concurrent use of radiation and Temodar. Hemolytic workup, LDH, bilirubin level all unremarkable.  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 B12 and folic acid normal. Appreciate hematology consultation. Transfuse for active bleeding with platelet count less than 50,000. Transfuse for platelet count less than 20,000 without active bleeding. Currently no approved indication for use of Nplate.   GBM. Recent diagnosis. Neurosurgery consulted at [facility] ED and recommended no intervention for surgery. Repeat MRI was motion degraded study although no definite mass or nodular involvement seen. MRI with and without contrast scheduled on 2/2 outpatient. Outpatient follow-up with [facility] oncologist recommended.   Normocytic anemia. Monitor for now. Supportive measures recommended. 1 PRBC ordered on 1/27.  Hemoglobin 7.8. Procrit given on 1/25. Transfuse for hemoglobin less than 8.   Chemotherapy-induced neutropenia. ANC less than 1. 1 order of G-CSF on  1/27. Monitor CBC with  differential.   Hypothyroidism. TSH actually more than 10. Continue Synthroid.  Dose increased from 137-150.   HTN. On bisoprolol at home. Also on chlorthalidone at home as well as lisinopril. Medications are currently on hold as the blood pressure is actually well-controlled right now. Diuretics were actually held as the patient had for AKI after receiving radiation. For now monitor closely.   GERD. Continue PPI.   HLD. On statin.  Will continue.   Goals of care conversation. Extensive discussion with the family and the patient. Patient is unable to participate in the conversation. Family would like to continue full code for now. Patient does have a living well, recommended family to bring her to the hospital if possible to understand what the patient's actual wishes were.   Mild hypokalemia. Recurrent hypomagnesemia. Replaced Will monitor.   Type 2 diabetes mellitus well-controlled without long-term insulin use. Hemoglobin A1c 6.7. On sliding scale insulin. Does not appear to carry the diagnosis prior to admission.   Foley catheter. Inserted as the patient was critically ill in the ER.  Discontinued 1/27.  Able to void.   Subjective: Mentation improving.  No nausea no vomiting no fever no chills.  Able to answer questions clearly.  Physical Exam: In mild distress. No rash. S1-S2 present. Bowel sound present. Alert awake and oriented x 3.  Able to answer questions.  No focal deficit.  Able to follow commands with minimal effort.  Data Reviewed: I have Reviewed nursing notes, Vitals, and Lab results. Discussed with neurology. Reviewed CBC and CMP. Reordered CBC and CMP.  Disposition: Status is: Inpatient Remains inpatient appropriate because: Monitor for improvement in mentation  Place and maintain sequential compression device Start: 08/07/23 0327   Family Communication: Family at bedside Level of care: Progressive   Vitals:   08/11/23 1209 08/11/23 1450  08/11/23 1628 08/11/23 1815  BP: (!) 145/57 (!) 160/65 (!) 159/65 (!) 153/60  Pulse: 100 92 99 100  Resp: 18 18    Temp: 98.7 F (37.1 C) 98.3 F (36.8 C) 99.2 F (37.3 C) 98.1 F (36.7 C)  TempSrc: Axillary Oral Oral Oral  SpO2: 99% 95% 93% 95%  Weight:      Height:         Author: Lynden Oxford, MD 08/11/2023 6:23 PM  Please look on [email or web address] to find out who is on call.

## 2023-08-11 NOTE — Progress Notes (Signed)
Pharmacy Antibiotic Note  Dana Morton is a 73 y.o. female admitted on 08/06/2023 with concern for seizure in the setting of GBM.  Patient with thrombocytopenia and neutropenia with recent chemotherapy.  Noted Ecoli UTI, previously on cefadroxil, now with new fever Tm 100.4.  Pharmacy has been consulted for Cefepime dosing for febrile neutropenia.  Plan: Cefepime 2gm IV q8h Monitor renal function, culture data and clinical progress.  Height: 5\' 8"  (172.7 cm) Weight: 111 kg (244 lb 11.4 oz) IBW/kg (Calculated) : 63.9  Temp (24hrs), Avg:98.8 F (37.1 C), Min:97.9 F (36.6 C), Max:100.4 F (38 C)  Recent Labs  Lab 08/05/23 0505 08/05/23 0635 08/05/23 0700 08/05/23 1525 08/06/23 2242 08/07/23 0524 08/07/23 2011 08/08/23 0714 08/09/23 0711 08/09/23 1623 08/10/23 0641 08/10/23 1946 08/11/23 0518  WBC 3.2*   < >  --    < > 3.1* 3.0*   < > 2.0* 1.9* 1.7* 1.6* 2.4* 1.9*  CREATININE 1.13*  --   --   --  0.89 0.93  --  0.92 0.96  --  0.90  --   --   LATICACIDVEN 2.2*  --  1.6  --   --   --   --   --   --   --   --   --   --    < > = values in this interval not displayed.    Estimated Creatinine Clearance: 71.6 mL/min (by C-G formula based on SCr of 0.9 mg/dL).    Allergies  Allergen Reactions   Celebrex [Celecoxib] Nausea Only   Codeine Nausea Only    Antimicrobials this admission: Ceftriaxone 1/24 >> 1/27 Cefadrox >>  1/28 Cefepime 1/28 >>  Dose adjustments this admission:   Microbiology results: 1/28 BCx:  1/22 UCx: EColi pan sens except R: Amp and Amp/Sulb  Thank you for allowing pharmacy to be a part of this patient's care.  Brandon Melnick 08/11/2023 1:17 PM

## 2023-08-11 NOTE — Care Management Important Message (Signed)
Important Message  Patient Details  Name: Dana Morton MRN: [id number] Date of Birth: 10/28/1950   Important Message Given:  Yes - Medicare IM     Dorena Bodo 08/11/2023, 4:07 PM

## 2023-08-11 NOTE — Evaluation (Signed)
Speech Language Pathology Evaluation Patient Details Name: RAMAYA GUILE MRN: [id number] DOB: 1951-01-31 Today's Date: 08/11/2023 Time: 4098-1191 SLP Time Calculation (min) (ACUTE ONLY): 34 min  Problem List:  Patient Active Problem List   Diagnosis Date Noted   Seizure (HCC) 08/07/2023   Acute cystitis 08/07/2023   Thrombocytopenia (HCC) 08/06/2023   Glioblastoma multiforme (HCC) 08/06/2023   Aphasia 05/12/2023   Mass of brain 05/12/2023   S/P total knee arthroplasty 09/23/2017   Diabetes mellitus type 2, uncomplicated (HCC) 09/22/2017   Hypertension 09/22/2017   Hypothyroidism, unspecified 09/22/2017   Vitamin D deficiency, unspecified 09/22/2017   Edema of both legs 03/03/2017   BMI 40.0-44.9, adult (HCC) 12/03/2015   Pure hypercholesterolemia 05/26/2014   Past Medical History:  Past Medical History:  Diagnosis Date   Anemia    h/o    Arthritis    Claustrophobia    Diabetes mellitus without complication (HCC)    Diverticulosis    Fibrocystic breast disease (FCBD)    Fibrocystic disease of breast    GERD (gastroesophageal reflux disease)    Herpes simplex    HLD (hyperlipidemia)    Hypercholesteremia    Hyperlipidemia    Hypertension    Hypothyroidism    Pre-diabetes    Pulmonary nodules    Pure hypercholesterolemia    Rotator cuff tear    LEFT SHOULDER   Sleep apnea    no cpap   Vitamin D deficiency    Vitamin D deficiency    Past Surgical History:  Past Surgical History:  Procedure Laterality Date   ABDOMINAL HYSTERECTOMY     BREAST BIOPSY Left 06/19/2020   stereo biopsy/ x clip/ path pending   BREAST EXCISIONAL BIOPSY Left 70/80s   benign   CHOLECYSTECTOMY     COLONOSCOPY WITH PROPOFOL N/A 03/22/2015   Procedure: COLONOSCOPY WITH PROPOFOL;  Surgeon: Wallace Cullens, MD;  Location: [facility] ENDOSCOPY;  Service: Gastroenterology;  Laterality: N/A;   COLONOSCOPY WITH PROPOFOL N/A 04/19/2021   Procedure: COLONOSCOPY WITH PROPOFOL;  Surgeon: Regis Bill, MD;  Location: [facility] ENDOSCOPY;  Service: Endoscopy;  Laterality: N/A;  AMPICILLIN - PER OFFICE   DG FOOT HEEL ([facility] HX)     DILATION AND CURETTAGE OF UTERUS     ESOPHAGOGASTRODUODENOSCOPY (EGD) WITH PROPOFOL N/A 03/22/2015   Procedure: ESOPHAGOGASTRODUODENOSCOPY (EGD) WITH PROPOFOL;  Surgeon: Wallace Cullens, MD;  Location: [facility] ENDOSCOPY;  Service: Gastroenterology;  Laterality: N/A;   JOINT REPLACEMENT     KNEE ARTHROPLASTY Right 09/23/2017   Procedure: COMPUTER ASSISTED TOTAL KNEE ARTHROPLASTY;  Surgeon: Donato Heinz, MD;  Location: [facility] ORS;  Service: Orthopedics;  Laterality: Right;   KNEE ARTHROSCOPY Right 08/02/2015   Procedure: ARTHROSCOPY KNEE, PARTIAL MEDIAL MENISECTOMY, PLICA INCISION;  Surgeon: Kennedy Bucker, MD;  Location: [facility] ORS;  Service: Orthopedics;  Laterality: Right;   KNEE ARTHROSCOPY     HPI:  Izzie Kuehnle is a 73 year old female who was admitted to [facility] from [facility] with seizures.  Pt has a recent GBM diagnosed 05/2023.  It was excised at [facility].  She underwent 3 weeks of radiation at [facility], completed last Friday. Presented to [facility] ER thought to be having intractable seizures.   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    Auditory comprehension: word discrimination - 100%, body part i.d. - 100%, L/R discrimination - 60%, one-step commands 80%.    Expressive  language: fluent output, naming to confrontation - 60%, responsive naming - 50%.  Difficulty shifting set between tasks.  Repetition intact.    Pt responds well to written cues to facilitate naming as well as breaking perseveratory patterns.  She is likely D/Cing home in a few days. She will need further aphasia therapy, either HH or OP at [facility].  Family agrees with plan.    SLP Assessment  SLP Recommendation/Assessment: Patient needs  continued Speech Lanaguage Pathology Services SLP Visit Diagnosis: Aphasia (R47.01)    Recommendations for follow up therapy are one component of a multi-disciplinary discharge planning process, led by the attending physician.  Recommendations may be updated based on patient status, additional functional criteria and insurance authorization.    Follow Up Recommendations  Home health SLP    Assistance Recommended at Discharge  Frequent or constant Supervision/Assistance  Functional Status Assessment Patient has had a recent decline in their functional status and demonstrates the ability to make significant improvements in function in a reasonable and predictable amount of time.  Frequency and Duration min 2x/week  1 week      SLP Evaluation Cognition  Overall Cognitive Status: Impaired/Different from baseline Arousal/Alertness: Awake/alert       Comprehension  Auditory Comprehension Overall Auditory Comprehension: Impaired Yes/No Questions: Impaired Basic Biographical Questions: 76-100% accurate Complex Questions: 25-49% accurate Commands: Impaired One Step Basic Commands: 50-74% accurate Conversation: Simple Visual Recognition/Discrimination Discrimination: Within Function Limits Reading Comprehension Reading Status: Impaired Word level: Within functional limits    Expression Expression Primary Mode of Expression: Verbal Verbal Expression Overall Verbal Expression: Impaired Initiation: No impairment Level of Generative/Spontaneous Verbalization: Conversation Repetition: Impaired Naming: Impairment Responsive: 0-25% accurate Confrontation: Impaired Convergent: 50-74% accurate Divergent: Not tested Verbal Errors: Semantic paraphasias;Phonemic paraphasias;Perseveration Pragmatics: No impairment Written Expression Written Expression: Not tested   Oral / Motor  Oral Motor/Sensory Function Overall Oral Motor/Sensory Function: Within functional limits Motor  Speech Overall Motor Speech: Appears within functional limits for tasks assessed            Blenda Mounts Laurice 08/11/2023, 1:53 PM Marchelle Folks L. Samson Frederic, MA CCC/SLP Clinical Specialist - Acute Care SLP Acute Rehabilitation Services Office number [phone]

## 2023-08-12 ENCOUNTER — Encounter (HOSPITAL_COMMUNITY): Payer: Self-pay | Admitting: Family Medicine

## 2023-08-12 ENCOUNTER — Inpatient Hospital Stay (HOSPITAL_COMMUNITY): Payer: Medicare Other

## 2023-08-12 DIAGNOSIS — D709 Neutropenia, unspecified: Secondary | ICD-10-CM

## 2023-08-12 DIAGNOSIS — C719 Malignant neoplasm of brain, unspecified: Secondary | ICD-10-CM | POA: Diagnosis not present

## 2023-08-12 DIAGNOSIS — K219 Gastro-esophageal reflux disease without esophagitis: Secondary | ICD-10-CM

## 2023-08-12 DIAGNOSIS — E119 Type 2 diabetes mellitus without complications: Secondary | ICD-10-CM

## 2023-08-12 DIAGNOSIS — R569 Unspecified convulsions: Secondary | ICD-10-CM | POA: Diagnosis not present

## 2023-08-12 DIAGNOSIS — N3000 Acute cystitis without hematuria: Secondary | ICD-10-CM | POA: Diagnosis not present

## 2023-08-12 DIAGNOSIS — E785 Hyperlipidemia, unspecified: Secondary | ICD-10-CM

## 2023-08-12 DIAGNOSIS — D61818 Other pancytopenia: Secondary | ICD-10-CM

## 2023-08-12 DIAGNOSIS — E876 Hypokalemia: Secondary | ICD-10-CM

## 2023-08-12 LAB — GLUCOSE, CAPILLARY
Glucose-Capillary: 131 mg/dL — ABNORMAL HIGH (ref 70–99)
Glucose-Capillary: 181 mg/dL — ABNORMAL HIGH (ref 70–99)
Glucose-Capillary: 153 mg/dL — ABNORMAL HIGH (ref 70–99)

## 2023-08-12 LAB — BPAM PLATELET PHERESIS
Blood Product Expiration Date: 202501302359
Blood Product Expiration Date: 202501302359
ISSUE DATE / TIME: 202501281142
ISSUE DATE / TIME: 202501281800
Unit Type and Rh: 6200
Unit Type and Rh: 7300

## 2023-08-12 LAB — PREPARE PLATELET PHERESIS
Unit division: 0
Unit division: 0

## 2023-08-12 LAB — BASIC METABOLIC PANEL
Anion gap: 10 (ref 5–15)
BUN: 12 mg/dL (ref 8–23)
CO2: 24 mmol/L (ref 22–32)
Calcium: 8.4 mg/dL — ABNORMAL LOW (ref 8.9–10.3)
Chloride: 102 mmol/L (ref 98–111)
Creatinine, Ser: 1.07 mg/dL — ABNORMAL HIGH (ref 0.44–1.00)
GFR, Estimated: 55 mL/min — ABNORMAL LOW (ref >60)
Glucose, Bld: 136 mg/dL — ABNORMAL HIGH (ref 70–99)
Potassium: 3.1 mmol/L — ABNORMAL LOW (ref 3.5–5.1)
Sodium: 136 mmol/L (ref 135–145)

## 2023-08-12 LAB — CBC WITH DIFFERENTIAL/PLATELET
Abs Immature Granulocytes: 0.02 10*3/uL (ref 0.00–0.07)
Basophils Absolute: 0 10*3/uL (ref 0.0–0.1)
Basophils Relative: 1 %
Eosinophils Absolute: 0 10*3/uL (ref 0.0–0.5)
Eosinophils Relative: 2 %
HCT: 25.3 % — ABNORMAL LOW (ref 36.0–46.0)
Hemoglobin: 8.8 g/dL — ABNORMAL LOW (ref 12.0–15.0)
Immature Granulocytes: 2 %
Lymphocytes Relative: 79 %
Lymphs Abs: 1 10*3/uL (ref 0.7–4.0)
MCH: 32.6 pg (ref 26.0–34.0)
MCHC: 34.8 g/dL (ref 30.0–36.0)
MCV: 93.7 fL (ref 80.0–100.0)
Monocytes Absolute: 0.1 10*3/uL (ref 0.1–1.0)
Monocytes Relative: 8 %
Neutro Abs: 0.1 10*3/uL — CL (ref 1.7–7.7)
Neutrophils Relative %: 8 %
Platelets: <5 10*3/uL — CL (ref 150–400)
RBC: 2.7 MIL/uL — ABNORMAL LOW (ref 3.87–5.11)
RDW: 13.7 % (ref 11.5–15.5)
WBC: 1.2 10*3/uL — CL (ref 4.0–10.5)
nRBC: 0 % (ref 0.0–0.2)

## 2023-08-12 LAB — TYPE AND SCREEN
ABO/RH(D): O POS
Antibody Screen: NEGATIVE

## 2023-08-12 LAB — MAGNESIUM: Magnesium: 1.5 mg/dL — ABNORMAL LOW (ref 1.7–2.4)

## 2023-08-12 LAB — PATHOLOGIST SMEAR REVIEW

## 2023-08-12 MED ORDER — LEVOTHYROXINE SODIUM 150 MCG PO TABS: 150.0000 ug | ORAL_TABLET | Freq: Every day | ORAL | 1 refills | Status: DC

## 2023-08-12 MED ORDER — PANTOPRAZOLE SODIUM 40 MG PO TBEC: 40.0000 mg | DELAYED_RELEASE_TABLET | Freq: Every day | ORAL | 1 refills | Status: DC

## 2023-08-12 MED ORDER — LORAZEPAM 2 MG/ML IJ SOLN: 2.0000 mg | INTRAMUSCULAR | 0 refills | Status: DC | PRN

## 2023-08-12 MED ORDER — ACETAMINOPHEN 325 MG PO TABS
650.0000 mg | ORAL_TABLET | Freq: Once | ORAL | Status: AC
  Administered 2023-08-12: 650 mg via ORAL
  Filled 2023-08-12: qty 2

## 2023-08-12 MED ORDER — LEVETIRACETAM 750 MG PO TABS: 1500.0000 mg | ORAL_TABLET | Freq: Two times a day (BID) | ORAL | 1 refills | Status: DC

## 2023-08-12 MED ORDER — BISOPROLOL FUMARATE 10 MG PO TABS
10.0000 mg | ORAL_TABLET | Freq: Every day | ORAL | Status: DC
  Filled 2023-08-12: qty 1

## 2023-08-12 MED ORDER — SODIUM CHLORIDE 0.9 % IV SOLN: 2.0000 g | Freq: Three times a day (TID) | INTRAVENOUS | Status: DC

## 2023-08-12 MED ORDER — LACOSAMIDE 200 MG PO TABS: 200.0000 mg | ORAL_TABLET | Freq: Two times a day (BID) | ORAL | 1 refills | Status: DC

## 2023-08-12 MED ORDER — CYANOCOBALAMIN 1000 MCG PO TABS: 1000.0000 ug | ORAL_TABLET | Freq: Every day | ORAL | Status: DC

## 2023-08-12 MED ORDER — FILGRASTIM-AAFI 480 MCG/0.8ML IJ SOSY: 480.0000 ug | PREFILLED_SYRINGE | Freq: Every day | INTRAMUSCULAR | Status: DC

## 2023-08-12 MED ORDER — SODIUM CHLORIDE 0.9% IV SOLUTION: Freq: Once | INTRAVENOUS | Status: AC

## 2023-08-12 MED ORDER — PERAMPANEL 2 MG PO TABS: 2.0000 mg | ORAL_TABLET | Freq: Every day | ORAL | 1 refills | Status: DC

## 2023-08-12 MED ORDER — MAGNESIUM SULFATE 4 GM/100ML IV SOLN
4.0000 g | Freq: Once | INTRAVENOUS | Status: AC
  Administered 2023-08-12: 4 g via INTRAVENOUS
  Filled 2023-08-12: qty 100

## 2023-08-12 MED ORDER — POTASSIUM CHLORIDE CRYS ER 10 MEQ PO TBCR
40.0000 meq | EXTENDED_RELEASE_TABLET | ORAL | Status: AC
Start: 2023-08-12 — End: 2023-08-12
  Administered 2023-08-12 (×2): 40 meq via ORAL
  Filled 2023-08-12 (×2): qty 4

## 2023-08-12 MED ORDER — OXYMETAZOLINE HCL 0.05 % NA SOLN: 2.0000 | NASAL | Status: DC | PRN

## 2023-08-12 MED ORDER — FILGRASTIM-AAFI 300 MCG/0.5ML IJ SOSY: 300.0000 ug | PREFILLED_SYRINGE | Freq: Every day | INTRAMUSCULAR | Status: DC

## 2023-08-12 NOTE — Progress Notes (Signed)
Spoke with family to confirm ok for ready bed at [facility]. Reportedly no available beds at [facility]. I called [phone] and spoke with Edith-RN and gave phone report. All questions were answered. Also spoke with Gearldine Bienenstock at [facility] call intake and they said if [facility] was not available we could call them at [phone] and they could send [facility] (ground) for transfer of PT.

## 2023-08-12 NOTE — Progress Notes (Signed)
HEMATOLOGY-ONCOLOGY PROGRESS NOTE  SUBJECTIVE: Severe pancytopenia History of presenting illness: Patient has been admitted from [facility] emergency room with supposedly intractable seizures and beds were not available at [facility].  She has a history of glioblastoma status post surgery at [facility] and chemoradiation at [facility].  She completed this therapy 07/31/2023. Since she had been in the hospital she has become pancytopenic with platelets drastically coming down along with neutrophil counts.  We started Granix injections on 08/10/2023.  In spite of that the WBC count continued to decline.  She has required multiple units of platelets but has not been responding well.   REVIEW OF SYSTEMS:     All other systems were reviewed with the patient and are negative.  I have reviewed the past medical history, past surgical history, social history and family history with the patient and they are unchanged from previous note.   PHYSICAL EXAMINATION: ECOG PERFORMANCE STATUS: 3 - Symptomatic, >50% confined to bed  Vitals:   08/12/23 1411 08/12/23 1536  BP: (!) 152/61 (!) 161/71  Pulse: 90 96  Resp: 18   Temp: 98.1 F (36.7 C) 98.1 F (36.7 C)  SpO2: 97% 94%   Filed Weights   08/08/23 0251  Weight: 244 lb 11.4 oz (111 kg)       LABORATORY DATA:  I have reviewed the data as listed    Latest Ref Rng & Units 08/12/2023    5:58 AM 08/10/2023    6:41 AM 08/09/2023    7:11 AM  CMP  Glucose 70 - 99 mg/dL 409  811  914   BUN 8 - 23 mg/dL 12  8  10    Creatinine 0.44 - 1.00 mg/dL 7.82  9.56  2.13   Sodium 135 - 145 mmol/L 136  137  136   Potassium 3.5 - 5.1 mmol/L 3.1  3.5  4.0   Chloride 98 - 111 mmol/L 102  104  106   CO2 22 - 32 mmol/L 24  23  23    Calcium 8.9 - 10.3 mg/dL 8.4  8.4  8.5   Total Protein 6.5 - 8.1 g/dL  6.0  6.0   Total Bilirubin 0.0 - 1.2 mg/dL  1.0  1.1   Alkaline Phos 38 - 126 U/L  61  80   AST 15 - 41 U/L  38  47   ALT 0 - 44 U/L  28  29     Lab Results  Component  Value Date   WBC 1.2 (LL) 08/12/2023   HGB 8.8 (L) 08/12/2023   HCT 25.3 (L) 08/12/2023   MCV 93.7 08/12/2023   PLT <5 (LL) 08/12/2023   NEUTROABS 0.1 (LL) 08/12/2023    ASSESSMENT AND PLAN: 1.  Severe pancytopenia: I discussed with the family that her blood counts seem to suggest that she may have aplastic anemia.  Other bone marrow disorders cannot be ruled out.  At this point we are doing supportive care with blood and platelet transfusions as needed and planning to do a bone marrow biopsy tomorrow. 2. for neutropenia I will increase the dosage of Granix to 480 mcg daily. 3.  I recommend transfer to academic institution because she is not responding to our care.

## 2023-08-12 NOTE — Discharge Summary (Signed)
Physician Discharge Summary  Dana Morton HYQ:[id number] DOB: 02-12-1951 DOA: 08/06/2023  PCP: Lynnea Ferrier, MD  Admit date: 08/06/2023 Discharge date: 08/12/2023  Time spent: 60 minutes  Recommendations for Outpatient Follow-up:  Patient will be transferred to tertiary care center [facility] for further evaluation by hematology/oncology.   Discharge Diagnoses:  Principal Problem:   Seizure (HCC) Active Problems:   Diabetes mellitus type 2, uncomplicated (HCC)   Hypertension   Hypothyroidism, unspecified   Pure hypercholesterolemia   Thrombocytopenia (HCC)   Glioblastoma multiforme (HCC)   Acute cystitis   Gastroesophageal reflux disease   Hypokalemia   Hypomagnesemia   Hyperlipidemia   Neutropenia (HCC)   Pancytopenia (HCC)   Discharge Condition: Stable  Diet recommendation: Dysphagia 3 diet with thin liquids  Filed Weights   08/08/23 0251  Weight: 111 kg    History of present illness:  HPI per Dr. Joneen Roach This is a 73 year old female with recent diagnosis of glioblastoma diagnosed 05/2023.  It was excised at [facility].  She underwent 3 weeks of radiation at [facility], completed last Friday.  The last 2 days patient has been getting IV fluids infusion as he was noted to have increased creatinine of 1.6.  That is normalized.     On the 22nd patient's son was called by his dad and told he was foaming at the mouth, unable to communicate.  She states [facility] ER thought to be having intractable seizures.  She was also found to have a UTI, started on IV Rocephin.  Cultures has grown E. coli, sensitivities pending.  Patient with significant thrombocytopenia 8.  Patient transfused PRBCs platelets 28.  Attempts were made to transfer patient to [facility] no beds were available.  Patient transferred to [facility] for continuous EEG.  While at [facility] patient's language was sporadic.  At point having full conversation, 5 minutes later saying the same words over and over again or  gibberish speech.  Patient thought to be having intermittent focal seizures. History provided by patient's son is present at bedside.   At [facility], ER, patient seen by neurohospitalist.  Continuous EEG recommended.  IV Keppra and IV Vimpat ordered.  Hospital Course:  #1 focal seizures in the setting of underlying tumor -Patient presented with seizures, noted to have been loaded with Keppra as well as Vimpat seen by neurology who recommended transfer to [facility] for long-term EEG. -Patient underwent LTM EEG. -MRI brain done with prior left temporal lobe tumor resection, T2 flair hyperintense signal abnormality surrounding the resection cavity may reflect gliosis, edema and/or residual infiltrative tumor.  No definite masslike or nodular enhancement is identified at the resection site.  Due to severe motion degradation of multiple postcontrast sequences repeat brain MRI recommended when patient better able to tolerate study. -Keppra dose increased to 1500 mg twice daily and patient started on Vimpat 200 mg twice daily. -Patient noted to have received a dose of perampanel and due to ongoing confusion early on during the hospitalization, neurology reconsulted 1/27 and patient started again on perampanel maintenance therapy. -Per neurology patient with further speech difficulties can increase perampanel to 4 mg daily. -Neurology recommended avoidance of Depakote due to severe thrombocytopenia. -Will likely need outpatient follow-up with neurology.   2.  Severe thrombocytopenia/??  Chemotherapy-induced pancytopenia -Patient noted to have had some epistaxis on 1/24 which subsequently resolved. -Initial concern was thrombocytopenia most likely in the setting of concurrent use of radiation and Temodar. -Patient underwent hemolytic workup with LDH, bilirubin level unremarkable. -  Felt less likely to be ITP. -Vitamin B12, folic acid level normal. -Patient seen in consultation by hematology/oncology  who noted an immature platelet fraction of 1.4% and recommended close observation of CBC and transfuse for platelet count < 20K or < 50K with bleeding. -Patient status post transfusion 1 unit PRBCs, transfusion of 9 units of platelets with platelet count still < 5K. -Case discussed with hematology/oncology, 08/12/2023, who reassessed patient and recommended probable bone marrow biopsy and transfer to tertiary care center for further evaluation. -IR consulted for bone marrow biopsy. -Patient will be transferred to tertiary care center [facility] for further evaluation and management.   3.  Concern for febrile neutropenia/E. coli UTI -It was noted prior to admission patient had no urinary symptoms or fever and patient had been receiving IV antibiotics for E. coli UTI and subsequently switched to oral antibiotics. -Patient noted during this hospitalization to spike a fever and a noted leukocytosis and as such blood cultures obtained and pending.  Patient with no respiratory symptoms. -Chest x-ray done during this hospitalization on 08/06/2023 with low lung volumes.  Bibasilar atelectasis. -Urinalysis done on 08/05/2023 with cultures > 100,000 colonies of E. coli. -Repeat chest x-ray and UA ordered. -Patient was placed on empiric IV cefepime. -Patient was placed on G-CSF until ANC > 1. -Hematology/oncology following and recommended transfer to tertiary care center based on severe thrombocytopenia.Marland Kitchen   4.  GBM -Patient with recent diagnosis. -Neurosurgery was consulted and [facility] ED and recommended no intervention for surgery. -Repeat MRI was done which showed prior left temporal lobe tumor resection, T2 flair hyperintense signal abnormality surrounding the resection cavity may reflect gliosis, edema and/or residual infiltrative tumor.  No definite masslike or nodular enhancement is identified at the resection site.  Due to severe motion degradation of multiple postcontrast sequences repeat brain MRI recommended  when patient better able to tolerate study. -Patient noted to be scheduled for repeat MRI with and without contrast at [facility] on 2/2 in the outpatient setting. -Follow-up with [facility] oncology as recommended.   5.  Normocytic anemia -Status post transfusion 1 unit PRBCs. -Hemoglobin remained stable at 8.8.   -Patient status post Procrit given 1/25. -Follow H&H. -Transfusion threshold hemoglobin < 8.   6.  Hypothyroidism -TSH noted to be 11.112. -Free TSH noted at 0.66 -Synthroid dose noted to have been increased from 137 mcg to 150 mcg during this hospitalization. -Will need repeat thyroid function studies done in 4 to 6 weeks.   7.  Hypertension -Noted to have been on chlorthalidone and lisinopril as well as bisoprolol prior to admission. -Antihypertensive medications were held initially on presentation.  -Diuretics and ACE inhibitor noted to have been held for AKI after receiving radiation. -Patient's home regimen bisoprolol resumed. -Chlorthalidone will be discontinued on discharge. -Will need outpatient follow-up with PCP for further blood pressure management.   8.  GERD -Patient maintained on PPI.   9.  Hyperlipidemia -Patient was maintained on home regimen statin.   10.  Hypokalemia/hypomagnesemia -Potassium low at 3.1, magnesium at 1.5 on 08/12/2023. -Oral potassium supplementation of 80 mEq ordered as well as magnesium sulfate 4 g IV x 1. -Will need repeat labs in the morning at transfer facility.   11.  Type 2 diabetes mellitus, well-controlled -Hemoglobin A1c 6.7 -CBG noted at 131. -Patient maintained on SSI.    Procedures: Transfusion 1 unit PRBCs 08/10/2023 Transfused 9 units of platelets Transfuse 1 unit platelet CT head 08/05/2023 Chest x-ray 08/06/2023, 08/05/2023 MRI brain 08/05/2023  Consultations: Hematology/oncology: Dr.  Gudena IR    Discharge Exam: Vitals:   08/12/23 1411 08/12/23 1536  BP: (!) 152/61 (!) 161/71  Pulse: 90 96  Resp: 18 18  Temp:  98.1 F (36.7 C) 98.1 F (36.7 C)  SpO2: 97% 94%    General: NAD.  Pallor. Cardiovascular: RRR no murmurs rubs or gallops.  No JVD.  No lower extremity edema. Respiratory: Clear to auscultation bilaterally.  No wheezes, no crackles, no rhonchi.  Fair air movement.  Speaking in full sentences.  Discharge Instructions   Discharge Instructions     Diet general   Complete by: As directed    Dysphagia 3 diet with thin liquids   Increase activity slowly   Complete by: As directed    No wound care   Complete by: As directed       Allergies as of 08/12/2023       Reactions   Celebrex [celecoxib] Nausea Only   Codeine Nausea Only        Medication List     STOP taking these medications    chlorthalidone 25 MG tablet Commonly known as: HYGROTON       TAKE these medications    acetaminophen 500 MG tablet Commonly known as: TYLENOL Take 1,000 mg by mouth every 6 (six) hours as needed for mild pain (pain score 1-3).   bisoprolol 10 MG tablet Commonly known as: ZEBETA Take 10 mg by mouth daily.   CALCIUM 600+D PO Take 1 tablet by mouth daily.   ceFEPIme 2 g in sodium chloride 0.9 % 100 mL Inject 2 g into the vein every 8 (eight) hours.   cetirizine 10 MG tablet Commonly known as: ZYRTEC Take 10 mg by mouth daily.   Cholecalciferol 25 MCG (1000 UT) tablet Take 1,000 Units by mouth daily.   cyanocobalamin 1000 MCG tablet Take 1 tablet (1,000 mcg total) by mouth daily. Start taking on: August 13, 2023   filgrastim-aafi 480 MCG/0.8ML Sosy injection Commonly known as: NIVESTYM Inject 0.8 mLs (480 mcg total) into the skin daily. Start taking on: August 13, 2023   HYDROcodone-acetaminophen 10-325 MG tablet Commonly known as: NORCO Take 1-2 tablets by mouth 2 (two) times daily as needed. For severe back pain.   lacosamide 200 MG Tabs tablet Commonly known as: VIMPAT Take 1 tablet (200 mg total) by mouth 2 (two) times daily.   levETIRAcetam 750 MG  tablet Commonly known as: KEPPRA Take 2 tablets (1,500 mg total) by mouth 2 (two) times daily. What changed:  medication strength how much to take when to take this   levothyroxine 150 MCG tablet Commonly known as: SYNTHROID Take 1 tablet (150 mcg total) by mouth daily before breakfast. What changed:  medication strength how much to take   lisinopril 40 MG tablet Commonly known as: ZESTRIL Take 40 mg by mouth at bedtime.   LORazepam 2 MG/ML injection Commonly known as: ATIVAN Inject 1 mL (2 mg total) into the vein every 4 (four) hours as needed for seizure.   lovastatin 40 MG tablet Commonly known as: MEVACOR Take 40 mg by mouth at bedtime.   Magnesium 500 MG Caps Take 500 mg by mouth daily.   multivitamin with minerals Tabs tablet Take 1 tablet by mouth daily. Centrum Silver for Women   omeprazole 20 MG capsule Commonly known as: PRILOSEC Take 20 mg by mouth daily before breakfast.   oxymetazoline 0.05 % nasal spray Commonly known as: AFRIN Place 2 sprays into both nostrils every 20 (twenty) minutes  as needed (for epistaxis that wont respond to pressure. notfiy MD after 2 doses).   pantoprazole 40 MG tablet Commonly known as: PROTONIX Take 1 tablet (40 mg total) by mouth daily. Start taking on: August 13, 2023   perampanel 2 MG tablet Commonly known as: FYCOMPA Take 1 tablet (2 mg total) by mouth at bedtime.       Allergies  Allergen Reactions   Celebrex [Celecoxib] Nausea Only   Codeine Nausea Only    Follow-up Information     [facility] Follow up.   Specialty: Home Health Services Why: The home health agency will contact you for the first home visit. Contact information: [address] [place] [address] [phone]         Follow-up with MD at [facility] Follow up.                   The results of significant diagnostics from this hospitalization (including imaging, microbiology, ancillary and laboratory) are  listed below for reference.    Significant Diagnostic Studies: Overnight EEG with video Result Date: 08/11/2023 Dana Quest, MD     08/11/2023 10:25 AM Patient Name: Dana Morton MRN: [id number] Epilepsy Attending: Charlsie Morton Referring Physician/Provider: Caryl Pina, MD Duration: 08/10/2023 1451 to 08/11/2023 0930  Patient history: 73 y.o. female with a PMHx of left temporal lobe GBM IDH wild-type, WHO grade 4 s/p resection at [facility] last year, s/p radiation and chemotherapy with temozolamide who initiually presented with new onset of focal seizure activity. EEG to evaluate for seizure  Level of alertness: awake, asleep  AEDs during EEG study: LEV, LCM, Perampanel  Technical aspects: This EEG study was done with scalp electrodes positioned according to the 10-20 International system of electrode placement. Electrical activity was reviewed with band pass filter of 1-70Hz , sensitivity of 7 uV/mm, display speed of 36mm/sec with a 60Hz  notched filter applied as appropriate. EEG data were recorded continuously and digitally stored.  Video monitoring was available and reviewed as appropriate.  Description: The posterior dominant rhythm consists of 8 Hz activity of moderate voltage (25-35 uV) seen predominantly in posterior head regions, asymmetric ( left<right) and reactive to eye opening and eye closing. Sleep was characterized by vertex waves, sleep spindles (12 to 14 Hz), maximal frontocentral region. Hyperventilation and photic stimulation were not performed. Lateralized periodic discharges were noted in left hemisphere maximal left temporal region at 0.5-1 Hz. EEG also showed continuous 3 to 6 Hz theta-delta slowing in left hemisphere.  ABNORMALITY - Lateralized periodic discharges ( LPD) left hemisphere - Continuous slow, left hemisphere  IMPRESSION: This study showed evidence of epileptogenicity and cortical dysfunction arising from left hemisphere likely secondary to underlying structural  abnormality. No definite seizures were noted  Dana Morton   Overnight EEG with video Result Date: 08/07/2023 Dana Quest, MD     08/08/2023  8:21 AM Patient Name: Dana Morton MRN: [id number] Epilepsy Attending: Charlsie Morton Referring Physician/Provider: Caryl Pina, MD Duration: 08/07/2023 0304 to 08/08/2023 0304 Patient history: 74 y.o. female with a PMHx of left temporal lobe GBM IDH wild-type, WHO grade 4 s/p resection at [facility] last year, s/p radiation and chemotherapy with temozolamide who initiually presented with new onset of focal seizure activity. EEG to evaluate for seizure Level of alertness: awake, asleep AEDs during EEG study: LEV, LCM, Perampanel Technical aspects: This EEG study was done with scalp electrodes positioned according to the 10-20 International system of electrode placement. Lobbyist  activity was reviewed with band pass filter of 1-70Hz , sensitivity of 7 uV/mm, display speed of 52mm/sec with a 60Hz  notched filter applied as appropriate. EEG data were recorded continuously and digitally stored.  Video monitoring was available and reviewed as appropriate. Description: The posterior dominant rhythm consists of 8 Hz activity of moderate voltage (25-35 uV) seen predominantly in posterior head regions, asymmetric ( left<right) and reactive to eye opening and eye closing. Sleep was characterized by vertex waves, sleep spindles (12 to 14 Hz), maximal frontocentral region. Hyperventilation and photic stimulation were not performed.  EEG also showed continuous 3 to 6 Hz theta-delta slowing in left hemisphere. Seizure without clinical sign was noted arising from left hemisphere. During seizure, EEG showed lateralized periodic discharges in left hemisphere at 1 to 1.5 Hz which evolved to 2.5 to 3 Hz with overriding rhythmicity admixed with 5 to 6 Hz theta slowing.  EEG then evolved into sharply contoured 2 to 3 Hz delta slowing.  Average 2 seizures were noted per hour, lasting  about 2 minutes on average. Gradually as anti-seizure medications were adjusted, seizures resolved and EEG showed lateralized periodic discharges with overriding fast activity in left hemisphere maximal left temporal region at 1 to 1.5 Hz which appeared rhythmic at times, lasting 5-9 seconds. This EEG pattern is consistent with brief ictal-interictal rhythmic discharges  (BIRDs). EEG continued to improve and gradually after around 2130 on 08/07/2023, BIRDs resolved. EEG then showed lateralized periodic discharges with overriding fast activity in left hemisphere maximal left temporal region at 1 to 1.5 Hz. ABNORMALITY - Seizure without clinical signs, left hemisphere - Brief ictal-interictal rhythmic discharges, left hemisphere - Lateralized periodic discharges with overriding fast activity ( LPD +F ) left hemisphere - Continuous slow, left hemisphere IMPRESSION: This study initially showed seizures without clinical signs arising from left hemisphere, average 2 seizures/hour, lasting about 2 minutes on average. Gradually as anti-seizure medications were adjusted, seizures resolved. Subsequently EEG showed evidence of epileptogenicity arising from left hemisphere which is on the ictal-interictal continuum with increased risk of seizure recurrence. Lastly there was cortical dysfunction in left hemisphere likely secondary to underlying structural abnormality and seizures. Dana Morton   DG Chest Portable 1 View Result Date: 08/06/2023 CLINICAL DATA:  change in resp pattern, eval for aspiration. ? seizure EXAM: PORTABLE CHEST 1 VIEW COMPARISON:  08/05/2023 FINDINGS: Heart is borderline in size. Mediastinal contours within normal limits. Low lung volumes. Bibasilar opacities, favor atelectasis. No effusions or acute bony abnormality. IMPRESSION: Low lung volumes with bibasilar atelectasis. Electronically Signed   By: Charlett Nose M.D.   On: 08/06/2023 19:12   MR Brain W and Wo Contrast Result Date:  08/05/2023 CLINICAL DATA:  Provided history: Brain/CNS neoplasm, assess treatment response. Additional history provided: Seizure, history of glioblastoma status post craniotomy and tumor resection, currently receiving temozolomide and radiation therapy. EXAM: MRI HEAD WITHOUT AND WITH CONTRAST TECHNIQUE: Multiplanar, multiecho pulse sequences of the brain and surrounding structures were obtained without and with intravenous contrast. CONTRAST:  10mL GADAVIST GADOBUTROL 1 MMOL/ML IV SOLN COMPARISON:  Head CT 08/05/2023.  Brain MRI 05/12/2023. FINDINGS: The examination is intermittently motion degraded, significantly limiting evaluation. Most notably, the coronal T2 sequence through the hippocampi is moderate-to-severely motion degraded and the axial and coronal T1-weighted post-contrast sequences are severely motion degraded. Brain: Resection cavity with chronic blood products in the mid and posterior left temporal lobe. T2 FLAIR hyperintense signal abnormality surrounding the resection cavity which may reflect gliosis, edema and/or residual infiltrative tumor. No definite  masslike or nodular enhancement is identified at the resection site, however, motion degradation significantly limits evaluation. Mild multifocal Mild multifocal T2 FLAIR hyperintense signal abnormality elsewhere within the cerebral white matter, nonspecific but compatible chronic small vessel ischemic disease. Chronic lacunar infarcts again noted within the left basal ganglia. There is no acute infarct. No extra-axial fluid collection. No midline shift. Vascular: Maintained flow voids within the proximal large arterial vessels. Skull and upper cervical spine: Left temporoparietal cranioplasty. Sinuses/Orbits: No mass or acute finding within the imaged orbits. Minimal mucosal thickening scattered throughout the paranasal sinuses. IMPRESSION: 1. Motion degraded examination, significantly limiting evaluation. 2. Prior left temporal lobe tumor  resection. T2 FLAIR hyperintense signal abnormality surrounding the resection cavity may reflect gliosis, edema and/or residual infiltrative tumor. No definite masslike or nodular enhancement is identified at the resection site, however, motion degradation significantly limits evaluation. Given the severe motion degradation of multiple post-contrast sequences, a repeat brain MRI is recommended when the patient is better able to tolerate the study. 3. Chronic small vessel ischemic disease as described. Electronically Signed   By: Jackey Loge D.O.   On: 08/05/2023 11:30   DG Chest Port 1 View Result Date: 08/05/2023 CLINICAL DATA:  74 year old female with altered mental status, witnessed seizure. Left cerebral hemisphere tumor by MRI in October. Hypotensive. EXAM: PORTABLE CHEST 1 VIEW COMPARISON:  CTA chest 05/31/2016 and earlier. FINDINGS: Portable AP semi upright view at 0508 hours. Lower lung volumes. Cardiac size now at the upper limits of normal. Other mediastinal contours are within normal limits. Visualized tracheal air column is within normal limits. Bilateral lung base hypo ventilation but no superimposed pneumothorax, pulmonary edema, consolidation. Paucity of bowel gas. No acute osseous abnormality identified. IMPRESSION: Lower lung volumes with lung base hypo ventilation. No other acute cardiopulmonary abnormality. Electronically Signed   By: Odessa Fleming M.D.   On: 08/05/2023 05:29   CT Head Wo Contrast Result Date: 08/05/2023 CLINICAL DATA:  73 year old female with altered mental status, witnessed seizure. Left cerebral hemisphere tumor by MRI in October. EXAM: CT HEAD WITHOUT CONTRAST TECHNIQUE: Contiguous axial images were obtained from the base of the skull through the vertex without intravenous contrast. RADIATION DOSE REDUCTION: This exam was performed according to the departmental dose-optimization program which includes automated exposure control, adjustment of the mA and/or kV according to  patient size and/or use of iterative reconstruction technique. COMPARISON:  Brain MRI and head CT 05/12/2023. FINDINGS: Brain: Posterior left temporal lobe resection cavity now is low-density on series 2, image 15. Regional cerebral edema largely resolved. No significant regional mass effect. Superimposed chronic lacunar infarcts of the left basal ganglia appear stable. No midline shift. No ventriculomegaly. No acute intracranial hemorrhage identified. No cortically based acute infarct identified. Vascular: No suspicious intracranial vascular hyperdensity. Skull: Motion artifact, particularly at the skull base. New left lateral craniotomy. Elsewhere the skull appears intact. Sinuses/Orbits: Visualized paranasal sinuses and mastoids are stable and well aerated. Other: New postoperative changes to the left scalp. Visualized orbit soft tissues are within normal limits. IMPRESSION: 1. Interval left lateral craniotomy and tumor resection from the posterior left temporal lobe. Regional edema and mass effect appear resolved by noncontrast CT, tumor restaging by MRI without and with contrast would be most sensitive and specific. 2. No new intracranial abnormality identified. 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 Electronically Signed   By: Odessa Fleming M.D.   On: 08/05/2023 05:28    Microbiology: Recent Results (from the past 240 hours)  Culture, blood (  routine x 2)     Status: None   Collection Time: 08/05/23  4:59 AM   Specimen: BLOOD  Result Value Ref Range Status   Specimen Description BLOOD RIGHT ASSIST CONTROL  Final   Special Requests   Final    BOTTLES DRAWN AEROBIC AND ANAEROBIC Blood Culture results may not be optimal due to an inadequate volume of blood received in culture bottles   Culture   Final    NO GROWTH 5 DAYS Performed at [facility], [address] [place], [place] [address]    Report Status 08/10/2023 FINAL  Final  Culture, blood (routine x 2)     Status: None    Collection Time: 08/05/23  5:05 AM   Specimen: BLOOD  Result Value Ref Range Status   Specimen Description BLOOD LEFT HAND  Final   Special Requests   Final    BOTTLES DRAWN AEROBIC AND ANAEROBIC Blood Culture adequate volume   Culture   Final    NO GROWTH 5 DAYS Performed at [facility], [address] [place], [place] [address]    Report Status 08/10/2023 FINAL  Final  Resp panel by RT-PCR (RSV, Flu A&B, Covid) Anterior Nasal Swab     Status: None   Collection Time: 08/05/23  5:12 AM   Specimen: Anterior Nasal Swab  Result Value Ref Range Status   SARS Coronavirus 2 by RT PCR NEGATIVE NEGATIVE Final    Comment: (NOTE) SARS-CoV-2 target nucleic acids are NOT DETECTED.  The SARS-CoV-2 RNA is generally detectable in upper respiratory specimens during the acute phase of infection. The lowest concentration of SARS-CoV-2 viral copies this assay can detect is 138 copies/mL. A negative result does not preclude SARS-Cov-2 infection and should not be used as the sole basis for treatment or other patient management decisions. A negative result may occur with  improper specimen collection/handling, submission of specimen other than nasopharyngeal swab, presence of viral mutation(s) within the areas targeted by this assay, and inadequate number of viral copies(<138 copies/mL). A negative result must be combined with clinical observations, patient history, and epidemiological information. The expected result is Negative.  Fact Sheet for Patients:  BloggerCourse.com  Fact Sheet for Healthcare Providers:  SeriousBroker.it  This test is no t yet approved or cleared by the [place] FDA and  has been authorized for detection and/or diagnosis of SARS-CoV-2 by FDA under an Emergency Use Authorization (EUA). This EUA will remain  in effect (meaning this test can be used) for the duration of the COVID-19 declaration under Section  564(b)(1) of the Act, 21 U.S.C.section 360bbb-3(b)(1), unless the authorization is terminated  or revoked sooner.       Influenza A by PCR NEGATIVE NEGATIVE Final   Influenza B by PCR NEGATIVE NEGATIVE Final    Comment: (NOTE) The Xpert Xpress SARS-CoV-2/FLU/RSV plus assay is intended as an aid in the diagnosis of influenza from Nasopharyngeal swab specimens and should not be used as a sole basis for treatment. Nasal washings and aspirates are unacceptable for Xpert Xpress SARS-CoV-2/FLU/RSV testing.  Fact Sheet for Patients: BloggerCourse.com  Fact Sheet for Healthcare Providers: SeriousBroker.it  This test is not yet approved or cleared by the [place] FDA and has been authorized for detection and/or diagnosis of SARS-CoV-2 by FDA under an Emergency Use Authorization (EUA). This EUA will remain in effect (meaning this test can be used) for the duration of the COVID-19 declaration under Section 564(b)(1) of the Act, 21 U.S.C. section 360bbb-3(b)(1), unless the  authorization is terminated or revoked.     Resp Syncytial Virus by PCR NEGATIVE NEGATIVE Final    Comment: (NOTE) Fact Sheet for Patients: BloggerCourse.com  Fact Sheet for Healthcare Providers: SeriousBroker.it  This test is not yet approved or cleared by the [place] FDA and has been authorized for detection and/or diagnosis of SARS-CoV-2 by FDA under an Emergency Use Authorization (EUA). This EUA will remain in effect (meaning this test can be used) for the duration of the COVID-19 declaration under Section 564(b)(1) of the Act, 21 U.S.C. section 360bbb-3(b)(1), unless the authorization is terminated or revoked.  Performed at [facility] [address] [place], [place] [address]   Urine Culture     Status: Abnormal   Collection Time: 08/05/23  5:40 AM   Specimen: Urine, Random  Result  Value Ref Range Status   Specimen Description   Final    URINE, RANDOM Performed at [facility], [address] [place], [place] [address]    Special Requests   Final    URINE, CATHETERIZED Performed at [facility], [address], [place], [place] [address]    Culture >=100,000 COLONIES/mL ESCHERICHIA COLI (A)  Final   Report Status 08/07/2023 FINAL  Final   Organism ID, Bacteria ESCHERICHIA COLI (A)  Final      Susceptibility   Escherichia coli - MIC*    AMPICILLIN >=32 RESISTANT Resistant     CEFAZOLIN <=4 SENSITIVE Sensitive     CEFEPIME <=0.12 SENSITIVE Sensitive     CEFTRIAXONE <=0.25 SENSITIVE Sensitive     CIPROFLOXACIN <=0.25 SENSITIVE Sensitive     GENTAMICIN <=1 SENSITIVE Sensitive     IMIPENEM <=0.25 SENSITIVE Sensitive     NITROFURANTOIN <=16 SENSITIVE Sensitive     TRIMETH/SULFA <=20 SENSITIVE Sensitive     AMPICILLIN/SULBACTAM >=32 RESISTANT Resistant     PIP/TAZO 8 SENSITIVE Sensitive ug/mL    * >=100,000 COLONIES/mL ESCHERICHIA COLI  Culture, blood (Routine X 2) w Reflex to ID Panel     Status: None (Preliminary result)   Collection Time: 08/11/23  9:46 AM   Specimen: BLOOD RIGHT HAND  Result Value Ref Range Status   Specimen Description BLOOD RIGHT HAND  Final   Special Requests   Final    BOTTLES DRAWN AEROBIC AND ANAEROBIC Blood Culture results may not be optimal due to an inadequate volume of blood received in culture bottles   Culture   Final    NO GROWTH < 24 HOURS Performed at [facility], [address] [place], [place] [address]    Report Status PENDING  Incomplete  Culture, blood (Routine X 2) w Reflex to ID Panel     Status: None (Preliminary result)   Collection Time: 08/11/23  9:46 AM   Specimen: BLOOD LEFT HAND  Result Value Ref Range Status   Specimen Description BLOOD LEFT HAND  Final   Special Requests   Final    BOTTLES DRAWN AEROBIC AND ANAEROBIC Blood Culture results may not be optimal due to an inadequate  volume of blood received in culture bottles   Culture   Final    NO GROWTH < 24 HOURS Performed at [facility], [address] [place], [place] [address]    Report Status PENDING  Incomplete     Labs: Basic Metabolic Panel: Recent Labs  Lab 08/07/23 0524 08/07/23 2011 08/08/23 0714 08/09/23 0711 08/10/23 0641 08/12/23 0558  NA 140  --  141 136 137 136  K 3.5  --  3.3* 4.0 3.5 3.1*  CL 105  --  105 106 104 102  CO2 26  --  25 23 23 24   GLUCOSE 119*  --  111* 125* 119* 136*  BUN 15  --  10 10 8 12   CREATININE 0.93  --  0.92 0.96 0.90 1.07*  CALCIUM 8.1*  --  8.2* 8.5* 8.4* 8.4*  MG 1.2* 1.8 1.6* 1.7 1.4* 1.5*  PHOS  --   --  3.7  --   --   --    Liver Function Tests: Recent Labs  Lab 08/07/23 0524 08/08/23 0714 08/09/23 0711 08/10/23 0641  AST 40 42* 47* 38  ALT 29 28 29 28   ALKPHOS 66 79 80 61  BILITOT 0.8 0.9 1.1 1.0  PROT 6.2* 5.7* 6.0* 6.0*  ALBUMIN 2.6* 2.5* 2.5* 2.5*   No results for input(s): "LIPASE", "AMYLASE" in the last 168 hours. No results for input(s): "AMMONIA" in the last 168 hours. CBC: Recent Labs  Lab 08/10/23 0641 08/10/23 1946 08/11/23 0518 08/11/23 1632 08/12/23 0558 08/12/23 1617  WBC 1.6* 2.4* 1.9* 1.5* 1.3* 1.2*  NEUTROABS 0.5*  --  0.9* 0.5* 0.2* 0.1*  HGB 7.8* 9.1* 9.0* 9.2* 8.6* 8.8*  HCT 22.4* 25.9* 25.4* 26.2* 25.0* 25.3*  MCV 94.9 93.5 93.4 92.9 94.7 93.7  PLT 9* 18* 12* 16* <5* <5*   Cardiac Enzymes: No results for input(s): "CKTOTAL", "CKMB", "CKMBINDEX", "TROPONINI" in the last 168 hours. BNP: BNP (last 3 results) No results for input(s): "BNP" in the last 8760 hours.  ProBNP (last 3 results) No results for input(s): "PROBNP" in the last 8760 hours.  CBG: Recent Labs  Lab 08/11/23 1625 08/11/23 2034 08/12/23 0613 08/12/23 1129 08/12/23 1608  GLUCAP 131* 165* 131* 181* 153*       Signed:  Ramiro Harvest MD.  [facility] 08/12/2023, 6:55 PM

## 2023-08-12 NOTE — Progress Notes (Signed)
PROGRESS NOTE    Dana Morton  ZOX:[id number] DOB: 03/14/1951 DOA: 08/06/2023 PCP: Lynnea Ferrier, MD   Chief Complaint  Patient presents with   Seizures    Brief Narrative:  PMH of GBM diagnosed in November.  Diagnosed as the patient was having some speech difficulties and was seen by neurologist. 05/29/2023 underwent left temporal craniotomy for resection of the tumor with Dr. Adriana Simas at [facility]. Was following up with oncology. Due to transportation needs she completed her radiation with concurrent chemotherapy with Temodar at [facility]. Last session was 1/17. Presented with sudden onset of confusion followed by seizures on 1/22 at [facility] hospital. Found to have severe thrombocytopenia as well as seizures.  Neurology, hematology as well as neurosurgery were consulted in the ED at [facility]. Patient was on the list for transfer to [facility] but unable to get a bed. Due to urgency of the inpatient therapy needs patient was transferred to [facility] ED.  Started on LTM EEG monitoring on arrival and seizure medications were adjusted as she was found to have ongoing seizure activities based on EEG. Received 2 platelet transfusion on 1/22. 1 more platelet transfusion 1/24.  Received 2 more platelet transfusion on 1/24. Neurology and hematology following here. 1/26 2 platelets given, platelet count improved from 8-17, 1 more platelet ordered. 1/27 1 unit of platelet ordered.  Due to reoccurrence of speech difficulty, neurology was reconsulted, hooked up to LTM EEG again, given a dose of perampanel and started on scheduled maintenance dose.  Keppra and Vimpat switched to p.o. started on G-CSF for neutropenia.  Received 1 PRBC as well. 1/28 2 more platelets ordered.  Neuro signed off.  LTM EEG discontinued.  Due to fever blood cultures performed and started on IV cefepime for concern for febrile neutropenia.   Assessment & Plan:   Principal Problem:   Seizure (HCC) Active  Problems:   Diabetes mellitus type 2, uncomplicated (HCC)   Hypertension   Hypothyroidism, unspecified   Pure hypercholesterolemia   Thrombocytopenia (HCC)   Glioblastoma multiforme (HCC)   Acute cystitis   Gastroesophageal reflux disease   Hypokalemia   Hypomagnesemia   Hyperlipidemia  #1 focal seizures in the setting of underlying tumor -Patient presented with seizures, noted to have been loaded with Keppra as well as Vimpat seen by neurology who recommended transfer to [facility] for long-term EEG. -Patient underwent LTM EEG. -MRI brain done with prior left temporal lobe tumor resection, T2 flair hyperintense signal abnormality surrounding the resection cavity may reflect gliosis, edema and/or residual infiltrative tumor.  No definite masslike or nodular enhancement is identified at the resection site.  Due to severe motion degradation of multiple postcontrast sequences repeat brain MRI recommended when patient better able to tolerate study. -Keppra dose increased to 1500 mg twice daily and patient started on Vimpat 200 mg twice daily. -Patient noted to have received a dose of perampanel and due to ongoing confusion neurology reconsulted 1/27 and patient started again on perampanel maintenance therapy. -Per neurology patient with further speech difficulties can increase perampanel to 4 mg daily. -Neurology recommending avoidance of Depakote due to severe thrombocytopenia. -Will likely need outpatient follow-up with neurology.  2.  Severe thrombocytopenia/??  Chemotherapy-induced pancytopenia -Patient noted to have had some epistaxis on 1/24 which subsequently resolved. -Initial concern was thrombocytopenia most likely in the setting of concurrent use of radiation and Temodar. -Patient underwent hemolytic workup with LDH, bilirubin level unremarkable. -Felt less likely to be ITP. -Vitamin B12,  folic acid level normal. -Patient seen in consultation by hematology/oncology who noted an  immature platelet fraction of 1.4% and recommended close observation of CBC and transfuse for platelet count < 20K or < 50K with bleeding. -Patient status post transfusion 1 unit PRBCs, transfusion of 9 units of platelets with platelet count still < 5K. -Transfuse a unit of platelets. -Case discussed with hematology/oncology this morning who will reassess patient and recommending probable bone marrow biopsy and transfer to tertiary care center for further evaluation. -IR consulted for bone marrow biopsy.  3.  Concern for febrile neutropenia/E. coli UTI -It was noted prior to admission patient had no urinary symptoms or fever and patient had been receiving IV antibiotics for E. coli UTI and subsequently switched to oral antibiotics. -Patient noted during this hospitalization to spike a fever and a noted leukocytosis and as such blood cultures obtained and pending.  Patient with no respiratory symptoms. -Chest x-ray done during this hospitalization on 08/06/2023 with low lung volumes.  Bibasilar atelectasis. -Urinalysis done on 08/05/2023 with cultures > 100,000 colonies of E. coli. -Will repeat chest x-ray, repeat UA. -Continue empiric IV cefepime. -Continue G-CSF until ANC > 1. -Hematology/oncology following.  4.  GBM -Patient with recent diagnosis. -Neurosurgery was consulted and [facility] ED and recommended no intervention for surgery. -Repeat MRI was done which showed prior left temporal lobe tumor resection, T2 flair hyperintense signal abnormality surrounding the resection cavity may reflect gliosis, edema and/or residual infiltrative tumor.  No definite masslike or nodular enhancement is identified at the resection site.  Due to severe motion degradation of multiple postcontrast sequences repeat brain MRI recommended when patient better able to tolerate study. -Patient noted to be scheduled for repeat MRI with and without contrast at [facility] on 2/2 in the outpatient setting. -Follow-up with [facility]  oncology as recommended.  5.  Normocytic anemia -Status post transfusion 1 unit PRBCs. -Hemoglobin currently stable at 8.8. -Patient status post Procrit given 1/25. -Follow H&H. -Transfusion threshold hemoglobin < 8.  6.  Hypothyroidism -TSH noted to be 11.112. -Free TSH noted at 0.66 -Continue Synthroid.  Synthroid dose noted to have been increased from 137 mcg to 150 mcg. -Will need repeat thyroid function studies done in 4 to 6 weeks.  7.  Hypertension -Noted to have been on chlorthalidone and lisinopril as well as bisoprolol prior to admission. -Antihypertensive medications on hold and BP currently stable. -Diuretics and ACE inhibitor noted to have been held for AKI after receiving radiation. -Will resume home regimen bisoprolol.  8.  GERD -PPI.  9.  Hyperlipidemia -Statin.  10.  Hypokalemia/hypomagnesemia -Potassium low at 3.1 this morning, magnesium at 1.5. -K-Dur 40 mill equivalents p.o. every 4 hours x 2 doses. -Magnesium sulfate 4 g IV x 1. -Repeat labs in the AM.  11.  Type 2 diabetes mellitus, well-controlled -Hemoglobin A1c 6.7 -CBG noted at 131. -SSI.  12.   DVT prophylaxis: SCDs Code Status: Full Family Communication: Updated patient, husband, son and daughter at bedside. Disposition: Probable transfer to tertiary care center for further evaluation and management  Status is: Inpatient Remains inpatient appropriate because: severity of illness.   Consultants:  Hematology/oncology: Dr. Pamelia Hoit IR  Procedures: Transfusion 1 unit PRBCs 08/10/2023 Transfused 9 units of platelets Transfuse 1 unit platelet CT head 08/05/2023 Chest x-ray 08/06/2023, 08/05/2023 MRI brain 08/05/2023   Antimicrobials:  Anti-infectives (From admission, onward)    Start     Dose/Rate Route Frequency Ordered Stop   08/11/23 1200  ceFEPIme (MAXIPIME) 2 g in  sodium chloride 0.9 % 100 mL IVPB        2 g 200 mL/hr over 30 Minutes Intravenous Every 8 hours 08/11/23 1040      08/10/23 1000  cefpodoxime (VANTIN) 100 MG/5ML suspension 200 mg  Status:  Discontinued        200 mg Oral Every 12 hours 08/09/23 1818 08/09/23 1819   08/10/23 1000  cefadroxil (DURICEF) capsule 1,000 mg  Status:  Discontinued        1,000 mg Oral 2 times daily 08/09/23 1819 08/11/23 0913   08/07/23 1000  cefTRIAXone (ROCEPHIN) 2 g in sodium chloride 0.9 % 100 mL IVPB  Status:  Discontinued        2 g 200 mL/hr over 30 Minutes Intravenous Every 24 hours 08/07/23 0325 08/07/23 0831   08/07/23 1000  cefTRIAXone (ROCEPHIN) 1 g in sodium chloride 0.9 % 100 mL IVPB  Status:  Discontinued        1 g 200 mL/hr over 30 Minutes Intravenous Every 24 hours 08/07/23 0831 08/09/23 1818         Subjective: Patient lying in bed, son, daughter, husband at bedside.  Patient denies any chest pain or shortness of breath.  No abdominal pain.  Denies any epistaxis.  Denies any bleeding.  Currently receiving platelet transfusion.  Objective: Vitals:   08/12/23 1043 08/12/23 1128 08/12/23 1411 08/12/23 1536  BP: (!) 171/66 (!) 169/69 (!) 152/61 (!) 161/71  Pulse:  (!) 101 90 96  Resp:   18   Temp: 98.3 F (36.8 C) (!) 97.4 F (36.3 C) 98.1 F (36.7 C) 98.1 F (36.7 C)  TempSrc: Oral Oral Oral Oral  SpO2: 95% 95% 97% 94%  Weight:      Height:        Intake/Output Summary (Last 24 hours) at 08/12/2023 1826 Last data filed at 08/12/2023 1730 Gross per 24 hour  Intake 741 ml  Output 1150 ml  Net -409 ml   Filed Weights   08/08/23 0251  Weight: 111 kg    Examination:  General exam: Appears calm and comfortable  Respiratory system: Clear to auscultation.  No wheezes, no crackles, no rhonchi.  Fair air movement.  Respiratory effort normal. Cardiovascular system: S1 & S2 heard, RRR. No JVD, murmurs, rubs, gallops or clicks. No pedal edema. Gastrointestinal system: Abdomen is nondistended, soft and nontender. No organomegaly or masses felt. Normal bowel sounds heard. Central nervous system: Alert  and oriented. No focal neurological deficits. Extremities: Symmetric 5 x 5 power. Skin: No rashes, lesions or ulcers Psychiatry: Judgement and insight appear normal. Mood & affect appropriate.     Data Reviewed: I have personally reviewed following labs and imaging studies  CBC: Recent Labs  Lab 08/10/23 0641 08/10/23 1946 08/11/23 0518 08/11/23 1632 08/12/23 0558 08/12/23 1617  WBC 1.6* 2.4* 1.9* 1.5* 1.3* 1.2*  NEUTROABS 0.5*  --  0.9* 0.5* 0.2* 0.1*  HGB 7.8* 9.1* 9.0* 9.2* 8.6* 8.8*  HCT 22.4* 25.9* 25.4* 26.2* 25.0* 25.3*  MCV 94.9 93.5 93.4 92.9 94.7 93.7  PLT 9* 18* 12* 16* <5* <5*    Basic Metabolic Panel: Recent Labs  Lab 08/07/23 0524 08/07/23 2011 08/08/23 0714 08/09/23 0711 08/10/23 0641 08/12/23 0558  NA 140  --  141 136 137 136  K 3.5  --  3.3* 4.0 3.5 3.1*  CL 105  --  105 106 104 102  CO2 26  --  25 23 23 24   GLUCOSE 119*  --  111*  125* 119* 136*  BUN 15  --  10 10 8 12   CREATININE 0.93  --  0.92 0.96 0.90 1.07*  CALCIUM 8.1*  --  8.2* 8.5* 8.4* 8.4*  MG 1.2* 1.8 1.6* 1.7 1.4* 1.5*  PHOS  --   --  3.7  --   --   --     GFR: Estimated Creatinine Clearance: 60.2 mL/min (A) (by C-G formula based on SCr of 1.07 mg/dL (H)).  Liver Function Tests: Recent Labs  Lab 08/07/23 0524 08/08/23 0714 08/09/23 0711 08/10/23 0641  AST 40 42* 47* 38  ALT 29 28 29 28   ALKPHOS 66 79 80 61  BILITOT 0.8 0.9 1.1 1.0  PROT 6.2* 5.7* 6.0* 6.0*  ALBUMIN 2.6* 2.5* 2.5* 2.5*    CBG: Recent Labs  Lab 08/11/23 1625 08/11/23 2034 08/12/23 0613 08/12/23 1129 08/12/23 1608  GLUCAP 131* 165* 131* 181* 153*     Recent Results (from the past 240 hours)  Culture, blood (routine x 2)     Status: None   Collection Time: 08/05/23  4:59 AM   Specimen: BLOOD  Result Value Ref Range Status   Specimen Description BLOOD RIGHT ASSIST CONTROL  Final   Special Requests   Final    BOTTLES DRAWN AEROBIC AND ANAEROBIC Blood Culture results may not be optimal due to an  inadequate volume of blood received in culture bottles   Culture   Final    NO GROWTH 5 DAYS Performed at [facility], [address] [place], [place] [address]    Report Status 08/10/2023 FINAL  Final  Culture, blood (routine x 2)     Status: None   Collection Time: 08/05/23  5:05 AM   Specimen: BLOOD  Result Value Ref Range Status   Specimen Description BLOOD LEFT HAND  Final   Special Requests   Final    BOTTLES DRAWN AEROBIC AND ANAEROBIC Blood Culture adequate volume   Culture   Final    NO GROWTH 5 DAYS Performed at [facility], [address] [place], [place] [address]    Report Status 08/10/2023 FINAL  Final  Resp panel by RT-PCR (RSV, Flu A&B, Covid) Anterior Nasal Swab     Status: None   Collection Time: 08/05/23  5:12 AM   Specimen: Anterior Nasal Swab  Result Value Ref Range Status   SARS Coronavirus 2 by RT PCR NEGATIVE NEGATIVE Final    Comment: (NOTE) SARS-CoV-2 target nucleic acids are NOT DETECTED.  The SARS-CoV-2 RNA is generally detectable in upper respiratory specimens during the acute phase of infection. The lowest concentration of SARS-CoV-2 viral copies this assay can detect is 138 copies/mL. A negative result does not preclude SARS-Cov-2 infection and should not be used as the sole basis for treatment or other patient management decisions. A negative result may occur with  improper specimen collection/handling, submission of specimen other than nasopharyngeal swab, presence of viral mutation(s) within the areas targeted by this assay, and inadequate number of viral copies(<138 copies/mL). A negative result must be combined with clinical observations, patient history, and epidemiological information. The expected result is Negative.  Fact Sheet for Patients:  BloggerCourse.com  Fact Sheet for Healthcare Providers:  SeriousBroker.it  This test is no t yet approved or cleared by  the [place] FDA and  has been authorized for detection and/or diagnosis of SARS-CoV-2 by FDA under an Emergency Use Authorization (EUA). This EUA will remain  in effect (meaning this test can be used)  for the duration of the COVID-19 declaration under Section 564(b)(1) of the Act, 21 U.S.C.section 360bbb-3(b)(1), unless the authorization is terminated  or revoked sooner.       Influenza A by PCR NEGATIVE NEGATIVE Final   Influenza B by PCR NEGATIVE NEGATIVE Final    Comment: (NOTE) The Xpert Xpress SARS-CoV-2/FLU/RSV plus assay is intended as an aid in the diagnosis of influenza from Nasopharyngeal swab specimens and should not be used as a sole basis for treatment. Nasal washings and aspirates are unacceptable for Xpert Xpress SARS-CoV-2/FLU/RSV testing.  Fact Sheet for Patients: BloggerCourse.com  Fact Sheet for Healthcare Providers: SeriousBroker.it  This test is not yet approved or cleared by the [place] FDA and has been authorized for detection and/or diagnosis of SARS-CoV-2 by FDA under an Emergency Use Authorization (EUA). This EUA will remain in effect (meaning this test can be used) for the duration of the COVID-19 declaration under Section 564(b)(1) of the Act, 21 U.S.C. section 360bbb-3(b)(1), unless the authorization is terminated or revoked.     Resp Syncytial Virus by PCR NEGATIVE NEGATIVE Final    Comment: (NOTE) Fact Sheet for Patients: BloggerCourse.com  Fact Sheet for Healthcare Providers: SeriousBroker.it  This test is not yet approved or cleared by the [place] FDA and has been authorized for detection and/or diagnosis of SARS-CoV-2 by FDA under an Emergency Use Authorization (EUA). This EUA will remain in effect (meaning this test can be used) for the duration of the COVID-19 declaration under Section 564(b)(1) of the Act, 21  U.S.C. section 360bbb-3(b)(1), unless the authorization is terminated or revoked.  Performed at [facility], [address] [place], [place] [address]   Urine Culture     Status: Abnormal   Collection Time: 08/05/23  5:40 AM   Specimen: Urine, Random  Result Value Ref Range Status   Specimen Description   Final    URINE, RANDOM Performed at [facility], [address] [place], [place] [address]    Special Requests   Final    URINE, CATHETERIZED Performed at [facility], [address], [place], [place] [address]    Culture >=100,000 COLONIES/mL ESCHERICHIA COLI (A)  Final   Report Status 08/07/2023 FINAL  Final   Organism ID, Bacteria ESCHERICHIA COLI (A)  Final      Susceptibility   Escherichia coli - MIC*    AMPICILLIN >=32 RESISTANT Resistant     CEFAZOLIN <=4 SENSITIVE Sensitive     CEFEPIME <=0.12 SENSITIVE Sensitive     CEFTRIAXONE <=0.25 SENSITIVE Sensitive     CIPROFLOXACIN <=0.25 SENSITIVE Sensitive     GENTAMICIN <=1 SENSITIVE Sensitive     IMIPENEM <=0.25 SENSITIVE Sensitive     NITROFURANTOIN <=16 SENSITIVE Sensitive     TRIMETH/SULFA <=20 SENSITIVE Sensitive     AMPICILLIN/SULBACTAM >=32 RESISTANT Resistant     PIP/TAZO 8 SENSITIVE Sensitive ug/mL    * >=100,000 COLONIES/mL ESCHERICHIA COLI  Culture, blood (Routine X 2) w Reflex to ID Panel     Status: None (Preliminary result)   Collection Time: 08/11/23  9:46 AM   Specimen: BLOOD RIGHT HAND  Result Value Ref Range Status   Specimen Description BLOOD RIGHT HAND  Final   Special Requests   Final    BOTTLES DRAWN AEROBIC AND ANAEROBIC Blood Culture results may not be optimal due to an inadequate volume of blood received in culture bottles   Culture   Final    NO GROWTH < 24 HOURS Performed at [facility], [address], [place], [place] [address]    Report Status PENDING  Incomplete  Culture, blood (Routine X 2) w Reflex to ID Panel     Status: None (Preliminary  result)   Collection Time: 08/11/23  9:46 AM   Specimen: BLOOD LEFT HAND  Result Value Ref Range Status   Specimen Description BLOOD LEFT HAND  Final   Special Requests   Final    BOTTLES DRAWN AEROBIC AND ANAEROBIC Blood Culture results may not be optimal due to an inadequate volume of blood received in culture bottles   Culture   Final    NO GROWTH < 24 HOURS Performed at [facility], [address] [place], [place] [address]    Report Status PENDING  Incomplete         Radiology Studies: Overnight EEG with video Result Date: 08/11/2023 Charlsie Quest, MD     08/11/2023 10:25 AM Patient Name: Dana Morton MRN: [id number] Epilepsy Attending: Charlsie Quest Referring Physician/Provider: Caryl Pina, MD Duration: 08/10/2023 1451 to 08/11/2023 0930  Patient history: 73 y.o. female with a PMHx of left temporal lobe GBM IDH wild-type, WHO grade 4 s/p resection at [facility] last year, s/p radiation and chemotherapy with temozolamide who initiually presented with new onset of focal seizure activity. EEG to evaluate for seizure  Level of alertness: awake, asleep  AEDs during EEG study: LEV, LCM, Perampanel  Technical aspects: This EEG study was done with scalp electrodes positioned according to the 10-20 International system of electrode placement. Electrical activity was reviewed with band pass filter of 1-70Hz , sensitivity of 7 uV/mm, display speed of 45mm/sec with a 60Hz  notched filter applied as appropriate. EEG data were recorded continuously and digitally stored.  Video monitoring was available and reviewed as appropriate.  Description: The posterior dominant rhythm consists of 8 Hz activity of moderate voltage (25-35 uV) seen predominantly in posterior head regions, asymmetric ( left<right) and reactive to eye opening and eye closing. Sleep was characterized by vertex waves, sleep spindles (12 to 14 Hz), maximal frontocentral region. Hyperventilation and photic stimulation were not  performed. Lateralized periodic discharges were noted in left hemisphere maximal left temporal region at 0.5-1 Hz. EEG also showed continuous 3 to 6 Hz theta-delta slowing in left hemisphere.  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 No definite seizures were noted  Priyanka [name]        Scheduled Meds:  bisoprolol  10 mg Oral Daily   Chlorhexidine Gluconate Cloth  6 each Topical Daily   vitamin B-12  1,000 mcg Oral Daily   feeding supplement  237 mL Oral BID BM   [START ON 08/13/2023] filgastrim (NIVESTYM) SQ  480 mcg Subcutaneous Daily   insulin aspart  0-5 Units Subcutaneous QHS   insulin aspart  0-9 Units Subcutaneous TID WC   lacosamide  200 mg Oral BID   levETIRAcetam  1,500 mg Oral BID   levothyroxine  150 mcg Oral Q0600   pantoprazole  40 mg Oral Daily   perampanel  2 mg Oral QHS   Continuous Infusions:  ceFEPime (MAXIPIME) IV 2 g (08/12/23 1238)     LOS: 5 days    Time spent: 45 minutes    Ramiro Harvest, MD [facility]   To contact the attending provider between 7A-7P or  the covering provider during after hours 7P-7A, please log into the web site [email or web address] and access using universal [place] password for that web site. If you do not have the password, please call the hospital operator.  08/12/2023, 6:26 PM

## 2023-08-12 NOTE — Consult Note (Signed)
Chief Complaint: Thrombocytopenia  Referring Provider(s): [facility]  Supervising Physician: Marliss Coots  Patient Status: [facility] - In-pt  History of Present Illness: Dana Morton is a 73 y.o. female with glioblastoma diagnosed in November.    On 05/29/2023 underwent left temporal craniotomy for resection of the tumor with Dr. Adriana Simas at [facility].  She presented with sudden onset of confusion followed by seizures on 08/05/23 at [facility].  Labs revealed thrombocytopenia.    Hematology was consulted ED at [facility].  There was attempts made to transfer to [facility] but unable to get a bed so she was transferred to [facility] ED.    She had ongoing seizure activities based on EEG.  Received 2 platelet transfusion on 08/05/23 and again on 08/07/23, 08/09/23, and 08/10/23.  We are asked to perform a bone marrow biopsy.  No nausea/vomiting. No Fever/chills. ROS negative.  Patient is Full Code  Past Medical History:  Diagnosis Date   Anemia    h/o    Arthritis    Claustrophobia    Diabetes mellitus without complication (HCC)    Diverticulosis    Fibrocystic breast disease (FCBD)    Fibrocystic disease of breast    GERD (gastroesophageal reflux disease)    Herpes simplex    HLD (hyperlipidemia)    Hypercholesteremia    Hyperlipidemia    Hypertension    Hypothyroidism    Pre-diabetes    Pulmonary nodules    Pure hypercholesterolemia    Rotator cuff tear    LEFT SHOULDER   Sleep apnea    no cpap   Vitamin D deficiency    Vitamin D deficiency     Past Surgical History:  Procedure Laterality Date   ABDOMINAL HYSTERECTOMY     BREAST BIOPSY Left 06/19/2020   stereo biopsy/ x clip/ path pending   BREAST EXCISIONAL BIOPSY Left 70/80s   benign   CHOLECYSTECTOMY     COLONOSCOPY WITH PROPOFOL N/A 03/22/2015   Procedure: COLONOSCOPY WITH PROPOFOL;  Surgeon: Wallace Cullens, MD;  Location: [facility] ENDOSCOPY;  Service: Gastroenterology;  Laterality: N/A;    COLONOSCOPY WITH PROPOFOL N/A 04/19/2021   Procedure: COLONOSCOPY WITH PROPOFOL;  Surgeon: Regis Bill, MD;  Location: [facility] ENDOSCOPY;  Service: Endoscopy;  Laterality: N/A;  AMPICILLIN - PER OFFICE   DG FOOT HEEL ([facility] HX)     DILATION AND CURETTAGE OF UTERUS     ESOPHAGOGASTRODUODENOSCOPY (EGD) WITH PROPOFOL N/A 03/22/2015   Procedure: ESOPHAGOGASTRODUODENOSCOPY (EGD) WITH PROPOFOL;  Surgeon: Wallace Cullens, MD;  Location: [facility] ENDOSCOPY;  Service: Gastroenterology;  Laterality: N/A;   JOINT REPLACEMENT     KNEE ARTHROPLASTY Right 09/23/2017   Procedure: COMPUTER ASSISTED TOTAL KNEE ARTHROPLASTY;  Surgeon: Donato Heinz, MD;  Location: [facility] ORS;  Service: Orthopedics;  Laterality: Right;   KNEE ARTHROSCOPY Right 08/02/2015   Procedure: ARTHROSCOPY KNEE, PARTIAL MEDIAL MENISECTOMY, PLICA INCISION;  Surgeon: Kennedy Bucker, MD;  Location: [facility] ORS;  Service: Orthopedics;  Laterality: Right;   KNEE ARTHROSCOPY      Allergies: Celebrex [celecoxib] and Codeine  Medications: Prior to Admission medications   Medication Sig Start Date End Date Taking? Authorizing Provider  acetaminophen (TYLENOL) 500 MG tablet Take 1,000 mg by mouth every 6 (six) hours as needed for mild pain (pain score 1-3).    [provider]  bisoprolol (ZEBETA) 10 MG tablet Take 10 mg by mouth daily.    [provider]  Calcium Carbonate-Vitamin D (CALCIUM 600+D PO) Take 1 tablet  by mouth daily.    [provider]  cetirizine (ZYRTEC) 10 MG tablet Take 10 mg by mouth daily.    [provider]  chlorthalidone (HYGROTON) 25 MG tablet Take 25 mg by mouth daily.    [provider]  Cholecalciferol 25 MCG (1000 UT) tablet Take 1,000 Units by mouth daily.    [provider]  HYDROcodone-acetaminophen (NORCO) 10-325 MG tablet Take 1-2 tablets by mouth 2 (two) times daily as needed. For severe back pain. 07/22/17   [provider]  levETIRAcetam (KEPPRA) 1000 MG tablet  Take 500 mg by mouth every 12 (twelve) hours. Patient not taking: Reported on 08/06/2023 05/31/23   [provider]  levothyroxine (SYNTHROID) 137 MCG tablet Take 137 mcg by mouth daily before breakfast. 12/30/22   [provider]  lisinopril (PRINIVIL,ZESTRIL) 40 MG tablet Take 40 mg by mouth at bedtime.     [provider]  lovastatin (MEVACOR) 40 MG tablet Take 40 mg by mouth at bedtime.    [provider]  Magnesium 500 MG CAPS Take 500 mg by mouth daily.    [provider]  Multiple Vitamin (MULTIVITAMIN WITH MINERALS) TABS tablet Take 1 tablet by mouth daily. Centrum Silver for Women    [provider]  omeprazole (PRILOSEC) 20 MG capsule Take 20 mg by mouth daily before breakfast.     [provider]     Family History  Problem Relation Age of Onset   Breast cancer Neg Hx     Social History   Socioeconomic History   Marital status: Married    Spouse name: Not on file   Number of children: Not on file   Years of education: Not on file   Highest education level: Not on file  Occupational History   Not on file  Tobacco Use   Smoking status: Never   Smokeless tobacco: Never  Vaping Use   Vaping status: Never Used  Substance and Sexual Activity   Alcohol use: No   Drug use: No   Sexual activity: Not on file  Other Topics Concern   Not on file  Social History Narrative   ** Merged History Encounter **       Social Drivers of Health   Financial Resource Strain: Patient Declined (06/23/2023)   Received from [facility]   Overall Financial Resource Strain (CARDIA)    Difficulty of Paying Living Expenses: Patient declined  Food Insecurity: No Food Insecurity (08/08/2023)   Hunger Vital Sign    Worried About Running Out of Food in the Last Year: Never true    Ran Out of Food in the Last Year: Never true  Transportation Needs: No Transportation Needs (08/08/2023)   PRAPARE - Therapist, art (Medical): No    Lack of Transportation (Non-Medical): No  Physical Activity: Not on file  Stress: Not on file  Social Connections: Moderately Isolated (08/08/2023)   Social Connection and Isolation Panel [NHANES]    Frequency of Communication with Friends and Family: Three times a week    Frequency of Social Gatherings with Friends and Family: Once a week    Attends Religious Services: 1 to 4 times per year    Active Member of [employer] or Organizations: No    Attends Banker Meetings: Never    Marital Status: Widowed     Review of Systems: A 12 point ROS discussed and pertinent positives are indicated in the HPI above.  All other systems are negative.    Vital Signs: BP (!) 171/66 (BP Location: Right Arm)   Pulse (!) 109   Temp 98.3 F (36.8 C) (Oral)   Resp 18   Ht 5\' 8"  (1.727 m)   Wt 244 lb 11.4 oz (111 kg)   SpO2 95%   BMI 37.21 kg/m   Advance Care Plan: The advanced care place/surrogate decision maker was discussed at the time of visit and the patient did not wish to discuss or was not able to name a surrogate decision maker or provide an advance care plan.  Physical Exam Vitals reviewed.  Constitutional:      Appearance: She is obese.  HENT:     Head: Normocephalic and atraumatic.  Eyes:     Extraocular Movements: Extraocular movements intact.  Cardiovascular:     Rate and Rhythm: Normal rate and regular rhythm.  Pulmonary:     Effort: Pulmonary effort is normal. No respiratory distress.     Breath sounds: Normal breath sounds.  Abdominal:     Palpations: Abdomen is soft.  Musculoskeletal:        General: Normal range of motion.     Cervical back: Normal range of motion.  Skin:    General: Skin is warm and dry.  Neurological:     General: No focal deficit present.     Mental Status: She is alert.  Psychiatric:        Mood and Affect: Mood normal.        Behavior: Behavior normal.    Labs:  CBC: Recent Labs     08/10/23 1946 08/11/23 0518 08/11/23 1632 08/12/23 0558  WBC 2.4* 1.9* 1.5* 1.3*  HGB 9.1* 9.0* 9.2* 8.6*  HCT 25.9* 25.4* 26.2* 25.0*  PLT 18* 12* 16* <5*    COAGS: Recent Labs    05/12/23 1347 08/05/23 0505 08/05/23 1525  INR 1.1 1.0  --   APTT 30  --  26    BMP: Recent Labs    08/08/23 0714 08/09/23 0711 08/10/23 0641 08/12/23 0558  NA 141 136 137 136  K 3.3* 4.0 3.5 3.1*  CL 105 106 104 102  CO2 25 23 23 24   GLUCOSE 111* 125* 119* 136*  BUN 10 10 8 12   CALCIUM 8.2* 8.5* 8.4* 8.4*  CREATININE 0.92 0.96 0.90 1.07*  GFRNONAA >60 >60 >60 55*    LIVER FUNCTION TESTS: Recent Labs    08/07/23 0524 08/08/23 0714 08/09/23 0711 08/10/23 0641  BILITOT 0.8 0.9 1.1 1.0  AST 40 42* 47* 38  ALT 29 28 29 28   ALKPHOS 66 79 80 61  PROT 6.2* 5.7* 6.0* 6.0*  ALBUMIN 2.6* 2.5* 2.5* 2.5*    TUMOR MARKERS: No results for input(s): "AFPTM", "CEA", "CA199", "CHROMGRNA" in the last 8760 hours.  Assessment and Plan:  Severe Thrombocytopenia.  Will plan for image guided bone marrow biopsy tomorrow as IR scheduled allows.  Risks and benefits of bone marrow biopsy was discussed with the patient and/or patient's family including, but not limited to bleeding, infection, damage to adjacent structures or low yield requiring additional tests.  All of the questions were answered and there is agreement to proceed.  Consent signed and in chart.  Thank you for allowing our service to participate in Loreli SHAMIAH KAHLER 's care.  Electronically Signed: Gwynneth Macleod, PA-C   08/12/2023, 11:18 AM      I spent a total of 20 Minutes  in face to face in clinical  consultation, greater than 50% of which was counseling/coordinating care for bone marrow biopsy.

## 2023-08-12 NOTE — Progress Notes (Signed)
PT Cancellation Note  Patient Details Name: Dana Morton MRN: [id number] DOB: Dec 06, 1950   Cancelled Treatment:    Reason Eval/Treat Not Completed: (P) Medical issues which prohibited therapy, pt platelets <5, confirmed with RN, will hold therapies this date and check back tomorrow as appropriate to continue with PT POC.   Lenora Boys. PTA Acute Rehabilitation Services Office: [phone]    Catalina Antigua 08/12/2023, 11:03 AM

## 2023-08-12 NOTE — Plan of Care (Signed)
Problem: Coping: Goal: Ability to adjust to condition or change in health will improve Outcome: Progressing   Problem: Skin Integrity: Goal: Risk for impaired skin integrity will decrease Outcome: Progressing   Problem: Education: Goal: Knowledge of General Education information will improve Description: Including pain rating scale, medication(s)/side effects and non-pharmacologic comfort measures Outcome: Progressing

## 2023-08-13 LAB — BPAM PLATELET PHERESIS
Blood Product Expiration Date: 202501302359
ISSUE DATE / TIME: 202501291007
Unit Type and Rh: 6200

## 2023-08-13 LAB — PREPARE PLATELET PHERESIS: Unit division: 0

## 2023-08-16 LAB — CULTURE, BLOOD (ROUTINE X 2)
Culture: NO GROWTH
Culture: NO GROWTH

## 2023-08-19 LAB — CBC WITH DIFFERENTIAL/PLATELET
Abs Immature Granulocytes: 0.07 K/uL (ref 0.00–0.07)
Basophils Absolute: 0 K/uL (ref 0.0–0.1)
Basophils Relative: 0 %
Eosinophils Absolute: 0 K/uL (ref 0.0–0.5)
Eosinophils Relative: 3 %
HCT: 25 % — ABNORMAL LOW (ref 36.0–46.0)
Hemoglobin: 8.6 g/dL — ABNORMAL LOW (ref 12.0–15.0)
Immature Granulocytes: 5 %
Lymphocytes Relative: 71 %
Lymphs Abs: 0.9 K/uL (ref 0.7–4.0)
MCH: 32.6 pg (ref 26.0–34.0)
MCHC: 34.4 g/dL (ref 30.0–36.0)
MCV: 94.7 fL (ref 80.0–100.0)
Monocytes Absolute: 0.1 K/uL (ref 0.1–1.0)
Monocytes Relative: 6 %
Neutro Abs: 0.2 K/uL — CL (ref 1.7–7.7)
Neutrophils Relative %: 15 %
Platelets: <5 K/uL — CL (ref 150–400)
RBC: 2.64 MIL/uL — ABNORMAL LOW (ref 3.87–5.11)
RDW: 13.9 % (ref 11.5–15.5)
Smear Review: DECREASED
WBC: 1.3 K/uL — CL (ref 4.0–10.5)
nRBC: 0 % (ref 0.0–0.2)

## 2023-10-01 ENCOUNTER — Other Ambulatory Visit: Payer: Self-pay

## 2023-10-01 ENCOUNTER — Encounter: Payer: Self-pay | Admitting: Intensive Care

## 2023-10-01 ENCOUNTER — Emergency Department
Admission: EM | Admit: 2023-10-01 | Discharge: 2023-10-01 | Disposition: A | Discharge status: Other Acute Inpatient Hospital | Attending: Emergency Medicine | Admitting: Emergency Medicine

## 2023-10-01 ENCOUNTER — Emergency Department

## 2023-10-01 DIAGNOSIS — R9431 Abnormal electrocardiogram [ECG] [EKG]: Secondary | ICD-10-CM | POA: Insufficient documentation

## 2023-10-01 DIAGNOSIS — E119 Type 2 diabetes mellitus without complications: Secondary | ICD-10-CM | POA: Diagnosis not present

## 2023-10-01 DIAGNOSIS — S31000A Unspecified open wound of lower back and pelvis without penetration into retroperitoneum, initial encounter: Secondary | ICD-10-CM | POA: Diagnosis not present

## 2023-10-01 DIAGNOSIS — I6782 Cerebral ischemia: Secondary | ICD-10-CM | POA: Insufficient documentation

## 2023-10-01 DIAGNOSIS — X58XXXA Exposure to other specified factors, initial encounter: Secondary | ICD-10-CM | POA: Diagnosis not present

## 2023-10-01 DIAGNOSIS — G9340 Encephalopathy, unspecified: Secondary | ICD-10-CM | POA: Diagnosis not present

## 2023-10-01 DIAGNOSIS — Z85841 Personal history of malignant neoplasm of brain: Secondary | ICD-10-CM | POA: Diagnosis not present

## 2023-10-01 DIAGNOSIS — S3992XA Unspecified injury of lower back, initial encounter: Secondary | ICD-10-CM | POA: Diagnosis present

## 2023-10-01 DIAGNOSIS — Z8673 Personal history of transient ischemic attack (TIA), and cerebral infarction without residual deficits: Secondary | ICD-10-CM | POA: Diagnosis not present

## 2023-10-01 HISTORY — DX: Unspecified convulsions: R56.9

## 2023-10-01 LAB — CBC WITH DIFFERENTIAL/PLATELET
Abs Immature Granulocytes: 0.05 10*3/uL (ref 0.00–0.07)
Basophils Absolute: 0 10*3/uL (ref 0.0–0.1)
Basophils Relative: 0 %
Eosinophils Absolute: 0.1 10*3/uL (ref 0.0–0.5)
Eosinophils Relative: 1 %
HCT: 24.5 % — ABNORMAL LOW (ref 36.0–46.0)
Hemoglobin: 7.9 g/dL — ABNORMAL LOW (ref 12.0–15.0)
Immature Granulocytes: 1 %
Lymphocytes Relative: 14 %
Lymphs Abs: 1 10*3/uL (ref 0.7–4.0)
MCH: 33.8 pg (ref 26.0–34.0)
MCHC: 32.2 g/dL (ref 30.0–36.0)
MCV: 104.7 fL — ABNORMAL HIGH (ref 80.0–100.0)
Monocytes Absolute: 0.4 10*3/uL (ref 0.1–1.0)
Monocytes Relative: 6 %
Neutro Abs: 5.4 10*3/uL (ref 1.7–7.7)
Neutrophils Relative %: 78 %
Platelets: 58 10*3/uL — ABNORMAL LOW (ref 150–400)
RBC: 2.34 MIL/uL — ABNORMAL LOW (ref 3.87–5.11)
RDW: 22.5 % — ABNORMAL HIGH (ref 11.5–15.5)
Smear Review: NORMAL
WBC: 6.9 10*3/uL (ref 4.0–10.5)
nRBC: 0 % (ref 0.0–0.2)

## 2023-10-01 LAB — COMPREHENSIVE METABOLIC PANEL
ALT: 30 U/L (ref 0–44)
AST: 35 U/L (ref 15–41)
Albumin: 2.9 g/dL — ABNORMAL LOW (ref 3.5–5.0)
Alkaline Phosphatase: 90 U/L (ref 38–126)
Anion gap: 13 (ref 5–15)
BUN: 60 mg/dL — ABNORMAL HIGH (ref 8–23)
CO2: 24 mmol/L (ref 22–32)
Calcium: 9 mg/dL (ref 8.9–10.3)
Chloride: 103 mmol/L (ref 98–111)
Creatinine, Ser: 1.53 mg/dL — ABNORMAL HIGH (ref 0.44–1.00)
GFR, Estimated: 35 mL/min — ABNORMAL LOW (ref >60)
Glucose, Bld: 103 mg/dL — ABNORMAL HIGH (ref 70–99)
Potassium: 3.8 mmol/L (ref 3.5–5.1)
Sodium: 140 mmol/L (ref 135–145)
Total Bilirubin: 0.7 mg/dL (ref 0.0–1.2)
Total Protein: 7.7 g/dL (ref 6.5–8.1)

## 2023-10-01 LAB — AMMONIA: Ammonia: <10 umol/L (ref 9–35)

## 2023-10-01 LAB — URINALYSIS, ROUTINE W REFLEX MICROSCOPIC
Bilirubin Urine: NEGATIVE
Glucose, UA: NEGATIVE mg/dL
Ketones, ur: NEGATIVE mg/dL
Nitrite: NEGATIVE
Protein, ur: NEGATIVE mg/dL
Specific Gravity, Urine: 1.013 (ref 1.005–1.030)
WBC, UA: >50 WBC/hpf (ref 0–5)
pH: 5 (ref 5.0–8.0)

## 2023-10-01 LAB — TYPE AND SCREEN
ABO/RH(D): O POS
Antibody Screen: NEGATIVE

## 2023-10-01 LAB — RESP PANEL BY RT-PCR (RSV, FLU A&B, COVID)  RVPGX2
Influenza A by PCR: NEGATIVE
Influenza B by PCR: NEGATIVE
Resp Syncytial Virus by PCR: NEGATIVE
SARS Coronavirus 2 by RT PCR: NEGATIVE

## 2023-10-01 LAB — PROTIME-INR
INR: 1.4 — ABNORMAL HIGH (ref 0.8–1.2)
Prothrombin Time: 17.3 s — ABNORMAL HIGH (ref 11.4–15.2)

## 2023-10-01 LAB — TROPONIN I (HIGH SENSITIVITY): Troponin I (High Sensitivity): 13 ng/L (ref <18)

## 2023-10-01 LAB — APTT: aPTT: 30 s (ref 24–36)

## 2023-10-01 LAB — LACTIC ACID, PLASMA: Lactic Acid, Venous: 1.1 mmol/L (ref 0.5–1.9)

## 2023-10-01 MED ORDER — LEVETIRACETAM IN NACL 1500 MG/100ML IV SOLN
1500.0000 mg | Freq: Once | INTRAVENOUS | Status: AC
  Administered 2023-10-01: 1500 mg via INTRAVENOUS
  Filled 2023-10-01: qty 100

## 2023-10-01 MED ORDER — SODIUM CHLORIDE 0.9 % IV BOLUS
500.0000 mL | Freq: Once | INTRAVENOUS | Status: AC
  Administered 2023-10-01: 500 mL via INTRAVENOUS

## 2023-10-01 MED ORDER — SODIUM CHLORIDE 0.9 % IV SOLN
1.0000 g | Freq: Once | INTRAVENOUS | Status: AC
  Administered 2023-10-01: 1 g via INTRAVENOUS
  Filled 2023-10-01: qty 10

## 2023-10-01 NOTE — ED Triage Notes (Addendum)
 Patient arrived by [facility] from [facility]. Daughter at bedside reports patient was talking to her at her baseline last night and this morning is having AMS and not speaking at baseline. Patient has weak cough and can follow simple commands.    Daughter reports she was told at  that patient has seizures.   Left foot, middle toe is swollen and red. Diagnosed with gout. Also has stage 2 wound on buttocks and starting 2nd antibiotic

## 2023-10-01 NOTE — ED Notes (Signed)
 Spoke with [name] at [facility] and patient placed on the list for transport however no ETA can be provided. Daughter of patient (Dana Morton) at the bedside provided recliner and updated on transfer status.

## 2023-10-01 NOTE — ED Notes (Signed)
 Emtala reviewed, signature obtained by family.

## 2023-10-01 NOTE — ED Provider Notes (Signed)
 [facility] Provider Note    Event Date/Time   First MD Initiated Contact with Patient 10/01/23 1338     (approximate)   History   Altered Mental Status   HPI  Dana Morton is a 73 year old female with history of T2DM, glioblastoma, seizure disorder presenting to the ER for evaluation of altered mental status.  Patient is accompanied by daughter who provides collateral history.  Patient with recent prolonged admission at [facility] after transfer from [facility] related to seizure-like activity and thrombocytopenia.  She is currently at [facility].  A few days ago, she was noted to have a sacral wound for which she was placed on antibiotics.  She was initially weak, had transient improvement after starting antibiotics, but over the past 2 days has become more confused and generally weak.  No reported falls.    Physical Exam   Triage Vital Signs: ED Triage Vitals  Encounter Vitals Group     BP 10/01/23 1216 (!) 108/40     Systolic BP Percentile --      Diastolic BP Percentile --      Pulse Rate 10/01/23 1216 80     Resp 10/01/23 1226 18     Temp 10/01/23 1218 98.4 F (36.9 C)     Temp Source 10/01/23 1218 Oral     SpO2 10/01/23 1216 100 %     Weight 10/01/23 1223 238 lb (108 kg)     Height 10/01/23 1223 5\' 7"  (1.702 m)     Head Circumference --      [facility] --      Pain Score 10/01/23 1222 0     Pain Loc --      Pain Education --      Exclude from Growth Chart --     Most recent vital signs: Vitals:   10/01/23 1345 10/01/23 1430  BP: (!) 105/46 (!) 99/42  Pulse: 84 77  Resp: 19 (!) 23  Temp:    SpO2: 98% 98%     General: Awake, slow to respond CV:  Regular rate, good peripheral perfusion.  Resp:  Unlabored respirations, lungs clear to auscultation Abd:  Nondistended, soft, no appreciable tenderness Skin:  Sacral wound with 2 areas of skin breakdown without active drainage Neuro:  No gross facial asymmetry.  No  tonic-clonic activity noted.  There is increased tone of the right upper extremity compared to contralateral side and patient is not clearly.  Able to tell me her name, but not able to tell me where we are her age.   ED Results / Procedures / Treatments   Labs (all labs ordered are listed, but only abnormal results are displayed) Labs Reviewed  PROTIME-INR - Abnormal; Notable for the following components:      Result Value   Prothrombin Time 17.3 (*)    INR 1.4 (*)    All other components within normal limits  CBC WITH DIFFERENTIAL/PLATELET - Abnormal; Notable for the following components:   RBC 2.34 (*)    Hemoglobin 7.9 (*)    HCT 24.5 (*)    MCV 104.7 (*)    RDW 22.5 (*)    Platelets 58 (*)    All other components within normal limits  COMPREHENSIVE METABOLIC PANEL - Abnormal; Notable for the following components:   Glucose, Bld 103 (*)    BUN 60 (*)    Creatinine, Ser 1.53 (*)    Albumin 2.9 (*)    GFR, Estimated 35 (*)  All other components within normal limits  URINALYSIS, ROUTINE W REFLEX MICROSCOPIC - Abnormal; Notable for the following components:   Color, Urine YELLOW (*)    APPearance CLOUDY (*)    Hgb urine dipstick SMALL (*)    Leukocytes,Ua MODERATE (*)    Bacteria, UA RARE (*)    Non Squamous Epithelial PRESENT (*)    All other components within normal limits  RESP PANEL BY RT-PCR (RSV, FLU A&B, COVID)  RVPGX2  APTT  AMMONIA  LACTIC ACID, PLASMA  TYPE AND SCREEN  TROPONIN I (HIGH SENSITIVITY)     EKG EKG independently reviewed interpreted by myself (ER attending) demonstrates:  EKG demonstrates normal sinus rhythm rate of 82, PR 116, QRS 72, QTc 425, no acute ST changes  RADIOLOGY Imaging independently reviewed and interpreted by myself demonstrates:  CT demonstrates site of prior left sided surgery  PROCEDURES:  Critical Care performed: No  Procedures   MEDICATIONS ORDERED IN ED: Medications  levETIRAcetam (KEPPRA) IVPB 1500 mg/ 100 mL  premix (has no administration in time range)  cefTRIAXone (ROCEPHIN) 1 g in sodium chloride 0.9 % 100 mL IVPB (has no administration in time range)  sodium chloride 0.9 % bolus 500 mL (500 mLs Intravenous New Bag/Given 10/01/23 1539)     IMPRESSION / MDM / ASSESSMENT AND PLAN / ED COURSE  I reviewed the triage vital signs and the nursing notes.  Differential diagnosis includes, but is not limited to, altered mental status secondary to known intracranial malignancy, nonconvulsive status epilepticus, intracranial bleed, infection  Patient's presentation is most consistent with acute presentation with potential threat to life or bodily function.  73 year old female presenting with altered mental status with recent prolonged admission related to glioblastoma and possible seizure-like activity.  No overt seizure activity here, but does have some increased tone of her right side.  Will obtain labs, CT head to further evaluate.  She does have a sacral wound, but labs here with normal white blood cell count and lactate, does not meet sepsis criteria.  Urinalysis is somewhat contaminated but concerning for infection.  Will order Rocephin.  Suspect patient will require readmission given her significant change in mental status, but will discuss with neurology to see where the patient is most appropriate to be admitted.  3:46 PM Case discussed with Dr. Amada Jupiter with neurology.  With patient's clinical history, he does feel that she should be at a facility that has continuous EEG.  With her oncologic history and recent discharge from [facility], did feel that it was likely appropriate to transfer to [facility].  I discussed this with the family.  They report that patient's oncologist and surgeon are both at [facility] and would prefer to be sent to do, however if this is unavailable due to capacity issues they are to comfortable with transfer to [facility].  Patient has not yet had her morning medications, so she  was ordered for IV Keppra.  Signed out to oncoming physician at 1545 pending transfer to outside facility for continued management.    FINAL CLINICAL IMPRESSION(S) / ED DIAGNOSES   Final diagnoses:  Acute encephalopathy  Wound of sacral region, initial encounter     Rx / DC Orders   ED Discharge Orders     None        Note:  This document was prepared using Dragon voice recognition software and may include unintentional dictation errors.   Trinna Post, MD 10/01/23 [phone]

## 2023-10-01 NOTE — Progress Notes (Signed)
 [facility] Liaison Note  This patient is followed by [facility] outpatient based palliative care.  [facility] will follow through discharge disposition  Please call with any Palliative Care questions or concerns.  [facility] Liaison [phone]

## 2023-10-01 NOTE — ED Notes (Signed)
 [facility]  TRANSFER   CENTER  CALLED  PER  DR  RAY  MD

## 2023-10-01 NOTE — ED Notes (Signed)
 [facility]  with  [facility]

## 2023-10-01 NOTE — Progress Notes (Signed)
   10/01/23 1520  Spiritual Encounters  Type of Visit Initial  Care provided to: Pt and family (Daughter at bedside)  Referral source Chaplain team  Reason for visit Routine spiritual support  OnCall Visit Yes  Spiritual Framework  Presenting Themes Meaning/purpose/sources of inspiration;Goals in life/care;Values and beliefs;Caregiving needs;Impactful experiences and emotions (for the Daughter; Pt is sleeping)  Family Stress Factors Other (Comment) (Daughter lives 3 hrs away and is concerned about travel for her step-father once the Pt is trasferred to a different hospital.)  Interventions  Spiritual Care Interventions Made Established relationship of care and support;Compassionate presence;Reflective listening;Normalization of emotions;Explored values/beliefs/practices/strengths;Prayer;Encouragement (for the Daughter)  Intervention Outcomes  Outcomes Connection to spiritual care;Connection to values and goals of care;Awareness of support;Other (comment) (for Daughter)

## 2023-10-01 NOTE — ED Notes (Signed)
 Ice chips provided per request. Daughter at the bedside assisting.

## 2023-10-01 NOTE — ED Notes (Signed)
 Urine sample obtained at this time. Patient placed in new brief and new sacral pad placed.

## 2023-10-01 NOTE — ED Triage Notes (Signed)
 First Nurse Note;  Pt via [facility] from [facility]. Pt c/o AMS started since last night, EMS reports responsive to verbal. Pt has a hx of seizures. Pt is alert but altered per baseline.  139/50 BP  97% on RA 80 HR 33 CO2 234 CBG

## 2023-10-20 ENCOUNTER — Other Ambulatory Visit: Payer: Self-pay

## 2023-10-20 ENCOUNTER — Emergency Department (HOSPITAL_COMMUNITY)

## 2023-10-20 ENCOUNTER — Inpatient Hospital Stay (HOSPITAL_COMMUNITY)
Admission: EM | Admit: 2023-10-20 | Discharge: 2023-10-26 | DRG: 100 | Disposition: A | Discharge status: Skilled Nursing Facility | Source: Skilled Nursing Facility | Attending: Internal Medicine | Admitting: Internal Medicine

## 2023-10-20 DIAGNOSIS — R410 Disorientation, unspecified: Secondary | ICD-10-CM | POA: Diagnosis present

## 2023-10-20 DIAGNOSIS — C719 Malignant neoplasm of brain, unspecified: Secondary | ICD-10-CM | POA: Diagnosis not present

## 2023-10-20 DIAGNOSIS — R4182 Altered mental status, unspecified: Secondary | ICD-10-CM | POA: Diagnosis not present

## 2023-10-20 DIAGNOSIS — E039 Hypothyroidism, unspecified: Secondary | ICD-10-CM | POA: Diagnosis present

## 2023-10-20 DIAGNOSIS — E861 Hypovolemia: Secondary | ICD-10-CM | POA: Diagnosis present

## 2023-10-20 DIAGNOSIS — G4730 Sleep apnea, unspecified: Secondary | ICD-10-CM | POA: Diagnosis present

## 2023-10-20 DIAGNOSIS — E872 Acidosis, unspecified: Secondary | ICD-10-CM | POA: Diagnosis present

## 2023-10-20 DIAGNOSIS — A419 Sepsis, unspecified organism: Secondary | ICD-10-CM

## 2023-10-20 DIAGNOSIS — G40802 Other epilepsy, not intractable, without status epilepticus: Secondary | ICD-10-CM | POA: Diagnosis present

## 2023-10-20 DIAGNOSIS — L97419 Non-pressure chronic ulcer of right heel and midfoot with unspecified severity: Secondary | ICD-10-CM | POA: Diagnosis present

## 2023-10-20 DIAGNOSIS — I10 Essential (primary) hypertension: Secondary | ICD-10-CM | POA: Diagnosis present

## 2023-10-20 DIAGNOSIS — G40909 Epilepsy, unspecified, not intractable, without status epilepticus: Secondary | ICD-10-CM

## 2023-10-20 DIAGNOSIS — L89320 Pressure ulcer of left buttock, unstageable: Secondary | ICD-10-CM | POA: Diagnosis present

## 2023-10-20 DIAGNOSIS — Z515 Encounter for palliative care: Secondary | ICD-10-CM

## 2023-10-20 DIAGNOSIS — Z7189 Other specified counseling: Secondary | ICD-10-CM | POA: Diagnosis not present

## 2023-10-20 DIAGNOSIS — S0093XA Contusion of unspecified part of head, initial encounter: Secondary | ICD-10-CM | POA: Diagnosis present

## 2023-10-20 DIAGNOSIS — Z85841 Personal history of malignant neoplasm of brain: Secondary | ICD-10-CM

## 2023-10-20 DIAGNOSIS — W19XXXA Unspecified fall, initial encounter: Secondary | ICD-10-CM

## 2023-10-20 DIAGNOSIS — D6959 Other secondary thrombocytopenia: Secondary | ICD-10-CM | POA: Diagnosis present

## 2023-10-20 DIAGNOSIS — L89153 Pressure ulcer of sacral region, stage 3: Secondary | ICD-10-CM | POA: Diagnosis present

## 2023-10-20 DIAGNOSIS — K219 Gastro-esophageal reflux disease without esophagitis: Secondary | ICD-10-CM | POA: Diagnosis present

## 2023-10-20 DIAGNOSIS — Z79899 Other long term (current) drug therapy: Secondary | ICD-10-CM

## 2023-10-20 DIAGNOSIS — F05 Delirium due to known physiological condition: Secondary | ICD-10-CM | POA: Diagnosis present

## 2023-10-20 DIAGNOSIS — L89319 Pressure ulcer of right buttock, unspecified stage: Secondary | ICD-10-CM | POA: Diagnosis present

## 2023-10-20 DIAGNOSIS — F0390 Unspecified dementia without behavioral disturbance: Secondary | ICD-10-CM | POA: Diagnosis present

## 2023-10-20 DIAGNOSIS — R5381 Other malaise: Secondary | ICD-10-CM | POA: Diagnosis present

## 2023-10-20 DIAGNOSIS — T66XXXS Radiation sickness, unspecified, sequela: Secondary | ICD-10-CM

## 2023-10-20 DIAGNOSIS — L8990 Pressure ulcer of unspecified site, unspecified stage: Secondary | ICD-10-CM | POA: Diagnosis present

## 2023-10-20 DIAGNOSIS — S0012XA Contusion of left eyelid and periocular area, initial encounter: Secondary | ICD-10-CM | POA: Diagnosis present

## 2023-10-20 DIAGNOSIS — E559 Vitamin D deficiency, unspecified: Secondary | ICD-10-CM | POA: Diagnosis present

## 2023-10-20 DIAGNOSIS — Z79890 Hormone replacement therapy (postmenopausal): Secondary | ICD-10-CM | POA: Diagnosis not present

## 2023-10-20 DIAGNOSIS — E11621 Type 2 diabetes mellitus with foot ulcer: Secondary | ICD-10-CM | POA: Diagnosis present

## 2023-10-20 DIAGNOSIS — E860 Dehydration: Secondary | ICD-10-CM | POA: Diagnosis present

## 2023-10-20 DIAGNOSIS — L97429 Non-pressure chronic ulcer of left heel and midfoot with unspecified severity: Secondary | ICD-10-CM | POA: Diagnosis present

## 2023-10-20 DIAGNOSIS — I9589 Other hypotension: Secondary | ICD-10-CM | POA: Diagnosis present

## 2023-10-20 DIAGNOSIS — G9340 Encephalopathy, unspecified: Secondary | ICD-10-CM | POA: Diagnosis present

## 2023-10-20 DIAGNOSIS — K625 Hemorrhage of anus and rectum: Secondary | ICD-10-CM | POA: Diagnosis not present

## 2023-10-20 DIAGNOSIS — E7800 Pure hypercholesterolemia, unspecified: Secondary | ICD-10-CM | POA: Diagnosis present

## 2023-10-20 DIAGNOSIS — W06XXXA Fall from bed, initial encounter: Secondary | ICD-10-CM | POA: Diagnosis present

## 2023-10-20 DIAGNOSIS — Z923 Personal history of irradiation: Secondary | ICD-10-CM

## 2023-10-20 DIAGNOSIS — R569 Unspecified convulsions: Secondary | ICD-10-CM | POA: Diagnosis not present

## 2023-10-20 DIAGNOSIS — Z96651 Presence of right artificial knee joint: Secondary | ICD-10-CM | POA: Diagnosis present

## 2023-10-20 DIAGNOSIS — R4781 Slurred speech: Secondary | ICD-10-CM | POA: Diagnosis present

## 2023-10-20 DIAGNOSIS — G9389 Other specified disorders of brain: Secondary | ICD-10-CM | POA: Diagnosis present

## 2023-10-20 DIAGNOSIS — S0990XA Unspecified injury of head, initial encounter: Principal | ICD-10-CM

## 2023-10-20 DIAGNOSIS — T451X5S Adverse effect of antineoplastic and immunosuppressive drugs, sequela: Secondary | ICD-10-CM

## 2023-10-20 DIAGNOSIS — Z90710 Acquired absence of both cervix and uterus: Secondary | ICD-10-CM

## 2023-10-20 DIAGNOSIS — K5641 Fecal impaction: Secondary | ICD-10-CM | POA: Diagnosis not present

## 2023-10-20 DIAGNOSIS — R339 Retention of urine, unspecified: Secondary | ICD-10-CM | POA: Diagnosis not present

## 2023-10-20 DIAGNOSIS — R32 Unspecified urinary incontinence: Secondary | ICD-10-CM | POA: Diagnosis present

## 2023-10-20 HISTORY — DX: Disorientation, unspecified: R41.0

## 2023-10-20 LAB — CBC WITH DIFFERENTIAL/PLATELET
Abs Immature Granulocytes: 0.04 10*3/uL (ref 0.00–0.07)
Basophils Absolute: 0 10*3/uL (ref 0.0–0.1)
Basophils Relative: 0 %
Eosinophils Absolute: 0 10*3/uL (ref 0.0–0.5)
Eosinophils Relative: 1 %
HCT: 31.4 % — ABNORMAL LOW (ref 36.0–46.0)
Hemoglobin: 10.4 g/dL — ABNORMAL LOW (ref 12.0–15.0)
Immature Granulocytes: 1 %
Lymphocytes Relative: 62 %
Lymphs Abs: 3.4 10*3/uL (ref 0.7–4.0)
MCH: 34.1 pg — ABNORMAL HIGH (ref 26.0–34.0)
MCHC: 33.1 g/dL (ref 30.0–36.0)
MCV: 103 fL — ABNORMAL HIGH (ref 80.0–100.0)
Monocytes Absolute: 0.2 10*3/uL (ref 0.1–1.0)
Monocytes Relative: 4 %
Neutro Abs: 1.7 10*3/uL (ref 1.7–7.7)
Neutrophils Relative %: 32 %
Platelets: 37 10*3/uL — ABNORMAL LOW (ref 150–400)
RBC: 3.05 MIL/uL — ABNORMAL LOW (ref 3.87–5.11)
RDW: 21.7 % — ABNORMAL HIGH (ref 11.5–15.5)
Smear Review: NORMAL
WBC: 5.4 10*3/uL (ref 4.0–10.5)
nRBC: 0 % (ref 0.0–0.2)

## 2023-10-20 LAB — RESP PANEL BY RT-PCR (RSV, FLU A&B, COVID)  RVPGX2
Influenza A by PCR: NEGATIVE
Influenza B by PCR: NEGATIVE
Resp Syncytial Virus by PCR: NEGATIVE
SARS Coronavirus 2 by RT PCR: NEGATIVE

## 2023-10-20 LAB — URINALYSIS, W/ REFLEX TO CULTURE (INFECTION SUSPECTED)
Bilirubin Urine: NEGATIVE
Glucose, UA: NEGATIVE mg/dL
Ketones, ur: NEGATIVE mg/dL
Nitrite: NEGATIVE
Protein, ur: 100 mg/dL — AB
Specific Gravity, Urine: 1.015 (ref 1.005–1.030)
WBC, UA: >50 WBC/hpf (ref 0–5)
pH: 6 (ref 5.0–8.0)

## 2023-10-20 LAB — COMPREHENSIVE METABOLIC PANEL WITH GFR
ALT: 78 U/L — ABNORMAL HIGH (ref 0–44)
AST: 59 U/L — ABNORMAL HIGH (ref 15–41)
Albumin: 3 g/dL — ABNORMAL LOW (ref 3.5–5.0)
Alkaline Phosphatase: 116 U/L (ref 38–126)
Anion gap: 11 (ref 5–15)
BUN: 33 mg/dL — ABNORMAL HIGH (ref 8–23)
CO2: 24 mmol/L (ref 22–32)
Calcium: 9.1 mg/dL (ref 8.9–10.3)
Chloride: 102 mmol/L (ref 98–111)
Creatinine, Ser: 0.97 mg/dL (ref 0.44–1.00)
GFR, Estimated: >60 mL/min (ref >60)
Glucose, Bld: 103 mg/dL — ABNORMAL HIGH (ref 70–99)
Potassium: 4.2 mmol/L (ref 3.5–5.1)
Sodium: 137 mmol/L (ref 135–145)
Total Bilirubin: 0.5 mg/dL (ref 0.0–1.2)
Total Protein: 7 g/dL (ref 6.5–8.1)

## 2023-10-20 LAB — PROTIME-INR
INR: 1 (ref 0.8–1.2)
Prothrombin Time: 13.2 s (ref 11.4–15.2)

## 2023-10-20 LAB — I-STAT CG4 LACTIC ACID, ED
Lactic Acid, Venous: 2.2 mmol/L (ref 0.5–1.9)
Lactic Acid, Venous: 2.4 mmol/L (ref 0.5–1.9)

## 2023-10-20 LAB — LACTIC ACID, PLASMA: Lactic Acid, Venous: 1.6 mmol/L (ref 0.5–1.9)

## 2023-10-20 MED ORDER — SODIUM CHLORIDE 0.9 % IV SOLN
2.0000 g | Freq: Once | INTRAVENOUS | Status: AC
  Administered 2023-10-20: 2 g via INTRAVENOUS
  Filled 2023-10-20: qty 12.5

## 2023-10-20 MED ORDER — VANCOMYCIN HCL IN DEXTROSE 1-5 GM/200ML-% IV SOLN: 1000.0000 mg | Freq: Once | INTRAVENOUS | Status: DC

## 2023-10-20 MED ORDER — LACTATED RINGERS IV BOLUS (SEPSIS)
1000.0000 mL | Freq: Once | INTRAVENOUS | Status: AC
  Administered 2023-10-20: 1000 mL via INTRAVENOUS

## 2023-10-20 MED ORDER — LEVETIRACETAM 500 MG PO TABS
1500.0000 mg | ORAL_TABLET | Freq: Two times a day (BID) | ORAL | Status: DC
  Administered 2023-10-20 – 2023-10-26 (×12): 1500 mg via ORAL
  Filled 2023-10-20: qty 2
  Filled 2023-10-20: qty 3
  Filled 2023-10-20: qty 2
  Filled 2023-10-20: qty 3
  Filled 2023-10-20 (×3): qty 2
  Filled 2023-10-20: qty 3
  Filled 2023-10-20 (×3): qty 2
  Filled 2023-10-20: qty 3

## 2023-10-20 MED ORDER — LACTATED RINGERS IV SOLN: INTRAVENOUS | Status: DC

## 2023-10-20 MED ORDER — LACOSAMIDE 200 MG PO TABS
200.0000 mg | ORAL_TABLET | Freq: Two times a day (BID) | ORAL | Status: DC
  Administered 2023-10-20 – 2023-10-26 (×12): 200 mg via ORAL
  Filled 2023-10-20 (×5): qty 1
  Filled 2023-10-20: qty 4
  Filled 2023-10-20 (×6): qty 1

## 2023-10-20 MED ORDER — VANCOMYCIN HCL 2000 MG/400ML IV SOLN
2000.0000 mg | Freq: Once | INTRAVENOUS | Status: AC
  Administered 2023-10-20: 2000 mg via INTRAVENOUS
  Filled 2023-10-20: qty 400

## 2023-10-20 MED ORDER — SODIUM CHLORIDE 0.9 % IV SOLN
1.0000 g | INTRAVENOUS | Status: DC
  Administered 2023-10-21: 1 g via INTRAVENOUS
  Filled 2023-10-20: qty 10

## 2023-10-20 MED ORDER — METRONIDAZOLE 500 MG/100ML IV SOLN
500.0000 mg | Freq: Once | INTRAVENOUS | Status: AC
  Administered 2023-10-20: 500 mg via INTRAVENOUS
  Filled 2023-10-20: qty 100

## 2023-10-20 MED ORDER — LEVOTHYROXINE SODIUM 75 MCG PO TABS
150.0000 ug | ORAL_TABLET | Freq: Every day | ORAL | Status: DC
  Administered 2023-10-21 – 2023-10-26 (×5): 150 ug via ORAL
  Filled 2023-10-20 (×6): qty 2

## 2023-10-20 NOTE — ED Triage Notes (Signed)
 Pt BIB [facility] from [facility]. Facility reported patient had a unwitnessed fall and hit the left side of her head above her eye and have a contusion. She is not on any blood thinners. The fire department came to the see an place a C-Collar on the patient. Patient saturation was 89 she was put on NRB by the fire department at 12 Liters Her saturation was 100%. EMS eventually came to the scene and turn her oxygent to  8 Liters on the NRB at that time her saturation came down to 98%. Patient have history of Dementia, she is aware of her name and birthday, which th facility said that is her normal.  BP 120 palpated, HR 66, RR 19.

## 2023-10-20 NOTE — H&P (Cosign Needed Addendum)
 Date: 10/20/2023               Patient Name:  Dana Morton MRN: [id number]  DOB: Oct 06, 1950 Age / Sex: 73 y.o., female   PCP: Lynnea Ferrier, MD         Medical Service: Internal Medicine Teaching Service         Attending Physician: Dr. Mayford Knife Dorene Ar, MD      First Contact: Dr. Monna Fam, MD Pager [phone]    Second Contact: Dr. Rocky Morel, DO          After Hours (After 5p/  First Contact Pager: [phone]  weekends / holidays): Second Contact Pager: [phone]   SUBJECTIVE   Chief Complaint: altered mental status  History of Present Illness:  Dana Morton is a 73 yo F with PMH significant for glioblastoma s/p resection in Nov 2024, recurrent seizures, HTN, hypothyroidism, brought to the ED by EMS for altered mental status after an unwitnessed fall. Per husband at bedside, patient fell from her bed at her nursing facility sometime last night. Since then she has been very confused and has not been speaking coherently. She has a large hematoma over her left eyebrow.   At her baseline, patient speaks normally with no cognitive deficits. As of recently, she rarely gets out of bed. Prior to her brain tumor resection she was independent in ADLs and helped take care of her husband. Patient's husband reports her glioblastoma was identified because she was having aphasia and word-finding difficulty. The resection was performed approximately 5 months ago, followed by multiple weeks of radiation performed at [facility]. Since then she has been having many absence-type seizures. She has since been residing at a long-term facility.   Patient denies chest pain, abdominal pain, shortness of breath, changes in stooling or voiding. She has been eating less recently because she doesn't like the food at her facility.   ED Course: Afeb, initial BP 74/59, improvement to 120s systolic with 1L IV fluids ORA No leukocytosis, Hgb stable at 10.4 BMP wnl Lactate 2.2 ->2.4 Resp  panel negative UA: large leukocytes, bacteria, protein CXR: negative CT Head: no acute intracranial abnormality CT cervical spine: no fracture, spinal canal stenosis CT maxillofacial: no fracture Code sepsis called: vanc, flagyl, cefepime given  Past Medical History: Past Medical History:  Diagnosis Date   Anemia    h/o    Arthritis    Claustrophobia    Diabetes mellitus without complication (HCC)    Diverticulosis    Fibrocystic breast disease (FCBD)    Fibrocystic disease of breast    GERD (gastroesophageal reflux disease)    Herpes simplex    HLD (hyperlipidemia)    Hypercholesteremia    Hyperlipidemia    Hypertension    Hypothyroidism    Pre-diabetes    Pulmonary nodules    Pure hypercholesterolemia    Rotator cuff tear    LEFT SHOULDER   Seizures (HCC)    Sleep apnea    no cpap   Vitamin D deficiency    Vitamin D deficiency     Meds:  Current Outpatient Medications  Medication Instructions   acetaminophen (TYLENOL) 1,000 mg, Every 6 hours PRN   bisoprolol (ZEBETA) 10 mg, Oral, Daily   Calcium Carbonate-Vitamin D (CALCIUM 600+D PO) 1 tablet, Oral, Daily   ceFEPIme 2 g in sodium chloride 0.9 % 100 mL 2 g, Intravenous, Every 8 hours   cetirizine (ZYRTEC) 10 mg, Oral, Daily   Cholecalciferol 1,000  Units, Oral, Daily   cyanocobalamin 1,000 mcg, Oral, Daily   filgrastim-aafi (NIVESTYM) 480 mcg, Subcutaneous, Daily   HYDROcodone-acetaminophen (NORCO) 10-325 MG tablet 1-2 tablets, Oral, 2 times daily PRN, For severe back pain.   lacosamide (VIMPAT) 200 mg, Oral, 2 times daily   levETIRAcetam (KEPPRA) 1,500 mg, Oral, 2 times daily   levothyroxine (SYNTHROID) 150 mcg, Oral, Daily before breakfast   lisinopril (ZESTRIL) 40 mg, Oral, Daily at bedtime   LORazepam (ATIVAN) 2 mg, Intravenous, Every 4 hours PRN   lovastatin (MEVACOR) 40 mg, Oral, Daily at bedtime   Magnesium 500 mg, Oral, Daily   Multiple Vitamin (MULTIVITAMIN WITH MINERALS) TABS tablet 1 tablet, Oral,  Daily, Centrum Silver for Women    omeprazole (PRILOSEC) 20 mg, Oral, Daily before breakfast   oxymetazoline (AFRIN) 0.05 % nasal spray 2 sprays, Each Nare, Every 20 min PRN   pantoprazole (PROTONIX) 40 mg, Oral, Daily   perampanel (FYCOMPA) 2 mg, Oral, Daily at bedtime    Past Surgical History:  Procedure Laterality Date   ABDOMINAL HYSTERECTOMY     BREAST BIOPSY Left 06/19/2020   stereo biopsy/ x clip/ path pending   BREAST EXCISIONAL BIOPSY Left 70/80s   benign   CHOLECYSTECTOMY     COLONOSCOPY WITH PROPOFOL N/A 03/22/2015   Procedure: COLONOSCOPY WITH PROPOFOL;  Surgeon: Wallace Cullens, MD;  Location: [facility] ENDOSCOPY;  Service: Gastroenterology;  Laterality: N/A;   COLONOSCOPY WITH PROPOFOL N/A 04/19/2021   Procedure: COLONOSCOPY WITH PROPOFOL;  Surgeon: Regis Bill, MD;  Location: [facility] ENDOSCOPY;  Service: Endoscopy;  Laterality: N/A;  AMPICILLIN - PER OFFICE   DG FOOT HEEL ([facility] HX)     DILATION AND CURETTAGE OF UTERUS     ESOPHAGOGASTRODUODENOSCOPY (EGD) WITH PROPOFOL N/A 03/22/2015   Procedure: ESOPHAGOGASTRODUODENOSCOPY (EGD) WITH PROPOFOL;  Surgeon: Wallace Cullens, MD;  Location: [facility] ENDOSCOPY;  Service: Gastroenterology;  Laterality: N/A;   JOINT REPLACEMENT     KNEE ARTHROPLASTY Right 09/23/2017   Procedure: COMPUTER ASSISTED TOTAL KNEE ARTHROPLASTY;  Surgeon: Donato Heinz, MD;  Location: [facility] ORS;  Service: Orthopedics;  Laterality: Right;   KNEE ARTHROSCOPY Right 08/02/2015   Procedure: ARTHROSCOPY KNEE, PARTIAL MEDIAL MENISECTOMY, PLICA INCISION;  Surgeon: Kennedy Bucker, MD;  Location: [facility] ORS;  Service: Orthopedics;  Laterality: Right;   KNEE ARTHROSCOPY      Social:  Living Situation: [facility] PCP: Lynnea Ferrier, MD Tobacco: None Alcohol: none Drugs: none  Family History: noncontributory  Allergies: Allergies as of 10/20/2023 - Unable to Assess 10/20/2023  Allergen Reaction Noted   Celebrex [celecoxib] Nausea Only 10/17/2013    Codeine Nausea Only 03/22/2015    Review of Systems: A complete ROS was negative except as per HPI.   OBJECTIVE:   Physical Exam: Blood pressure (!) 95/45, pulse 62, temperature (!) 97.5 F (36.4 C), temperature source Axillary, resp. rate 20, height 5\' 7"  (1.702 m), weight 97.5 kg, SpO2 100%.  Constitutional: elderly female, no acute distress. Appears confused HEENT: hematoma over L eyebrow. C collar in place Cardiovascular: RRR Pulmonary/Chest: CTAB. Normal work of breathing on room air Abdominal: soft, nontender, nondistended MSK: normal bulk and tone Neurological: intermittently alert and oriented. No focal deficits on neuro exam, though some confusion with following instructions. Waxing and waning cognition  Labs: CBC    Component Value Date/Time   WBC 5.4 10/20/2023 1024   RBC 3.05 (L) 10/20/2023 1024   HGB 10.4 (L) 10/20/2023 1024   HGB 12.9 02/01/2014 1341   HCT  31.4 (L) 10/20/2023 1024   HCT 39.3 02/01/2014 1341   PLT 37 (L) 10/20/2023 1024   PLT 269 02/01/2014 1341   MCV 103.0 (H) 10/20/2023 1024   MCV 95 02/01/2014 1341   MCH 34.1 (H) 10/20/2023 1024   MCHC 33.1 10/20/2023 1024   RDW 21.7 (H) 10/20/2023 1024   RDW 13.7 02/01/2014 1341   LYMPHSABS 3.4 10/20/2023 1024   LYMPHSABS 4.8 (H) 02/01/2014 1341   MONOABS 0.2 10/20/2023 1024   MONOABS 0.6 02/01/2014 1341   EOSABS 0.0 10/20/2023 1024   EOSABS 0.2 02/01/2014 1341   BASOSABS 0.0 10/20/2023 1024   BASOSABS 0.1 02/01/2014 1341     CMP     Component Value Date/Time   NA 137 10/20/2023 1024   NA 140 02/01/2014 1341   K 4.2 10/20/2023 1024   K 3.5 02/01/2014 1341   CL 102 10/20/2023 1024   CL 107 02/01/2014 1341   CO2 24 10/20/2023 1024   CO2 24 02/01/2014 1341   GLUCOSE 103 (H) 10/20/2023 1024   GLUCOSE 127 (H) 02/01/2014 1341   BUN 33 (H) 10/20/2023 1024   BUN 25 (H) 02/01/2014 1341   CREATININE 0.97 10/20/2023 1024   CREATININE 1.19 02/01/2014 1341   CALCIUM 9.1 10/20/2023 1024   CALCIUM  8.6 02/01/2014 1341   PROT 7.0 10/20/2023 1024   PROT 7.6 12/06/2012 1115   ALBUMIN 3.0 (L) 10/20/2023 1024   ALBUMIN 3.8 12/06/2012 1115   AST 59 (H) 10/20/2023 1024   AST 31 12/06/2012 1115   ALT 78 (H) 10/20/2023 1024   ALT 26 12/06/2012 1115   ALKPHOS 116 10/20/2023 1024   ALKPHOS 75 12/06/2012 1115   BILITOT 0.5 10/20/2023 1024   BILITOT 0.4 12/06/2012 1115   GFRNONAA >60 10/20/2023 1024   GFRNONAA 48 (L) 02/01/2014 1341   GFRAA >60 09/09/2017 0839   GFRAA 56 (L) 02/01/2014 1341   Imaging: CT HEAD WO CONTRAST ( ) Result Date: 10/20/2023 CLINICAL DATA:  Provided history: Facial trauma, blunt. Additional history provided: Unwitnessed fall (striking left side of head above eye). EXAM: CT HEAD WITHOUT CONTRAST CT MAXILLOFACIAL WITHOUT CONTRAST CT CERVICAL SPINE WITHOUT CONTRAST TECHNIQUE: Multidetector CT imaging of the head, cervical spine, and maxillofacial structures were performed using the standard protocol without intravenous contrast. Multiplanar CT image reconstructions of the cervical spine and maxillofacial structures were also generated. RADIATION DOSE REDUCTION: This exam was performed according to the departmental dose-optimization program which includes automated exposure control, adjustment of the mA and/or kV according to patient size and/or use of iterative reconstruction technique. COMPARISON:  Head CT 10/01/2023.  Brain MRI 08/05/2023. FINDINGS: CT HEAD FINDINGS Brain: Generalized cerebral atrophy. Resection cavity within the left temporal lobe, unchanged in appearance as compared to the recent prior head CT of 10/01/2023. 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 Mild patchy and ill-defined hypoattenuation elsewhere within the cerebral white matter, nonspecific but compatible with chronic small ischemic disease. There is no acute intracranial hemorrhage. No demarcated cortical infarct. No extra-axial fluid collection. No midline shift.  Vascular: No hyperdense vessel.  Atherosclerotic calcifications. Skull: No evidence of an acute calvarial fracture. Left temporoparietal cranioplasty. CT MAXILLOFACIAL FINDINGS Osseous: No evidence of an acute maxillofacial fracture. Orbits: Left anterior scalp/periorbital hematoma. No acute finding within the orbits. Sinuses: Small volume fluid within the right sphenoid sinus. Soft tissues: Left anterior scalp/periorbital hematoma. CT CERVICAL SPINE FINDINGS Alignment: Levocurvature of the cervical spine. Nonspecific reversal of the expected cervical lordosis. 2 mm C2-C3 grade 1  anterolisthesis. Skull base and vertebrae: The basion-dental and atlanto-dental intervals are maintained.No evidence of acute fracture to the cervical spine. Soft tissues and spinal canal: No prevertebral fluid or swelling. No visible canal hematoma. Disc levels: Cervical spondylosis with multilevel disc space narrowing, disc bulges/central disc protrusions, posterior disc osteophyte complexes, uncovertebral hypertrophy and facet arthropathy. Disc space narrowing is greatest at C4-C5, C5-C6 and C6-C7 (moderate at these levels). Multilevel spinal canal stenosis. Most notably, posterior disc osteophyte complexes contribute to at least moderate spinal canal stenosis at C4-C5 and C5-C6. Multilevel bony neural foraminal narrowing. Multilevel ventral osteophytes. Degenerative changes also present at the C1-C2 articulation. Upper chest: No consolidation within the imaged lung apices. No visible pneumothorax. IMPRESSION: CT head: 1.  No evidence of an acute intracranial abnormality. 2. Postoperative changes within the left temporal lobe. 3. Parenchymal atrophy and chronic small vessel ischemic disease. CT maxillofacial: 1. No evidence of an acute maxillofacial fracture. 2. Left anterior scalp/periorbital hematoma. 3. Mild right sphenoid sinusitis. CT cervical spine: 1. No evidence of an acute cervical spine fracture. 2. Nonspecific reversal of the  expected cervical lordosis. 3. Levocurvature of the cervical spine. 4. Mild C2-C3 grade 1 anterolisthesis. 5. Cervical spondylosis as outlined within the body of the report. Spinal canal stenosis is greatest at C4-C5 and C5-C6 (at least moderate at these levels). Multilevel bony neural foraminal narrowing. Electronically Signed   By: Jackey Loge D.O.   On: 10/20/2023 14:55   CT Cervical Spine Wo Contrast Result Date: 10/20/2023 CLINICAL DATA:  Provided history: Facial trauma, blunt. Additional history provided: Unwitnessed fall (striking left side of head above eye). EXAM: CT HEAD WITHOUT CONTRAST CT MAXILLOFACIAL WITHOUT CONTRAST CT CERVICAL SPINE WITHOUT CONTRAST TECHNIQUE: Multidetector CT imaging of the head, cervical spine, and maxillofacial structures were performed using the standard protocol without intravenous contrast. Multiplanar CT image reconstructions of the cervical spine and maxillofacial structures were also generated. RADIATION DOSE REDUCTION: This exam was performed according to the departmental dose-optimization program which includes automated exposure control, adjustment of the mA and/or kV according to patient size and/or use of iterative reconstruction technique. COMPARISON:  Head CT 10/01/2023.  Brain MRI 08/05/2023. FINDINGS: CT HEAD FINDINGS Brain: Generalized cerebral atrophy. Resection cavity within the left temporal lobe, unchanged in appearance as compared to the recent prior head CT of 10/01/2023. 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 Mild patchy and ill-defined hypoattenuation elsewhere within the cerebral white matter, nonspecific but compatible with chronic small ischemic disease. There is no acute intracranial hemorrhage. No demarcated cortical infarct. No extra-axial fluid collection. No midline shift. Vascular: No hyperdense vessel.  Atherosclerotic calcifications. Skull: No evidence of an acute calvarial fracture. Left temporoparietal  cranioplasty. CT MAXILLOFACIAL FINDINGS Osseous: No evidence of an acute maxillofacial fracture. Orbits: Left anterior scalp/periorbital hematoma. No acute finding within the orbits. Sinuses: Small volume fluid within the right sphenoid sinus. Soft tissues: Left anterior scalp/periorbital hematoma. CT CERVICAL SPINE FINDINGS Alignment: Levocurvature of the cervical spine. Nonspecific reversal of the expected cervical lordosis. 2 mm C2-C3 grade 1 anterolisthesis. Skull base and vertebrae: The basion-dental and atlanto-dental intervals are maintained.No evidence of acute fracture to the cervical spine. Soft tissues and spinal canal: No prevertebral fluid or swelling. No visible canal hematoma. Disc levels: Cervical spondylosis with multilevel disc space narrowing, disc bulges/central disc protrusions, posterior disc osteophyte complexes, uncovertebral hypertrophy and facet arthropathy. Disc space narrowing is greatest at C4-C5, C5-C6 and C6-C7 (moderate at these levels). Multilevel spinal canal stenosis. Most notably, posterior disc osteophyte  complexes contribute to at least moderate spinal canal stenosis at C4-C5 and C5-C6. Multilevel bony neural foraminal narrowing. Multilevel ventral osteophytes. Degenerative changes also present at the C1-C2 articulation. Upper chest: No consolidation within the imaged lung apices. No visible pneumothorax. IMPRESSION: CT head: 1.  No evidence of an acute intracranial abnormality. 2. Postoperative changes within the left temporal lobe. 3. Parenchymal atrophy and chronic small vessel ischemic disease. CT maxillofacial: 1. No evidence of an acute maxillofacial fracture. 2. Left anterior scalp/periorbital hematoma. 3. Mild right sphenoid sinusitis. CT cervical spine: 1. No evidence of an acute cervical spine fracture. 2. Nonspecific reversal of the expected cervical lordosis. 3. Levocurvature of the cervical spine. 4. Mild C2-C3 grade 1 anterolisthesis. 5. Cervical spondylosis as  outlined within the body of the report. Spinal canal stenosis is greatest at C4-C5 and C5-C6 (at least moderate at these levels). Multilevel bony neural foraminal narrowing. Electronically Signed   By: Jackey Loge D.O.   On: 10/20/2023 14:55   CT Maxillofacial Wo Contrast Result Date: 10/20/2023 CLINICAL DATA:  Provided history: Facial trauma, blunt. Additional history provided: Unwitnessed fall (striking left side of head above eye). EXAM: CT HEAD WITHOUT CONTRAST CT MAXILLOFACIAL WITHOUT CONTRAST CT CERVICAL SPINE WITHOUT CONTRAST TECHNIQUE: Multidetector CT imaging of the head, cervical spine, and maxillofacial structures were performed using the standard protocol without intravenous contrast. Multiplanar CT image reconstructions of the cervical spine and maxillofacial structures were also generated. RADIATION DOSE REDUCTION: This exam was performed according to the departmental dose-optimization program which includes automated exposure control, adjustment of the mA and/or kV according to patient size and/or use of iterative reconstruction technique. COMPARISON:  Head CT 10/01/2023.  Brain MRI 08/05/2023. FINDINGS: CT HEAD FINDINGS Brain: Generalized cerebral atrophy. Resection cavity within the left temporal lobe, unchanged in appearance as compared to the recent prior head CT of 10/01/2023. 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 Mild patchy and ill-defined hypoattenuation elsewhere within the cerebral white matter, nonspecific but compatible with chronic small ischemic disease. There is no acute intracranial hemorrhage. No demarcated cortical infarct. No extra-axial fluid collection. No midline shift. Vascular: No hyperdense vessel.  Atherosclerotic calcifications. Skull: No evidence of an acute calvarial fracture. Left temporoparietal cranioplasty. CT MAXILLOFACIAL FINDINGS Osseous: No evidence of an acute maxillofacial fracture. Orbits: Left anterior scalp/periorbital  hematoma. No acute finding within the orbits. Sinuses: Small volume fluid within the right sphenoid sinus. Soft tissues: Left anterior scalp/periorbital hematoma. CT CERVICAL SPINE FINDINGS Alignment: Levocurvature of the cervical spine. Nonspecific reversal of the expected cervical lordosis. 2 mm C2-C3 grade 1 anterolisthesis. Skull base and vertebrae: The basion-dental and atlanto-dental intervals are maintained.No evidence of acute fracture to the cervical spine. Soft tissues and spinal canal: No prevertebral fluid or swelling. No visible canal hematoma. Disc levels: Cervical spondylosis with multilevel disc space narrowing, disc bulges/central disc protrusions, posterior disc osteophyte complexes, uncovertebral hypertrophy and facet arthropathy. Disc space narrowing is greatest at C4-C5, C5-C6 and C6-C7 (moderate at these levels). Multilevel spinal canal stenosis. Most notably, posterior disc osteophyte complexes contribute to at least moderate spinal canal stenosis at C4-C5 and C5-C6. Multilevel bony neural foraminal narrowing. Multilevel ventral osteophytes. Degenerative changes also present at the C1-C2 articulation. Upper chest: No consolidation within the imaged lung apices. No visible pneumothorax. IMPRESSION: CT head: 1.  No evidence of an acute intracranial abnormality. 2. Postoperative changes within the left temporal lobe. 3. Parenchymal atrophy and chronic small vessel ischemic disease. CT maxillofacial: 1. No evidence of an acute maxillofacial fracture.  2. Left anterior scalp/periorbital hematoma. 3. Mild right sphenoid sinusitis. CT cervical spine: 1. No evidence of an acute cervical spine fracture. 2. Nonspecific reversal of the expected cervical lordosis. 3. Levocurvature of the cervical spine. 4. Mild C2-C3 grade 1 anterolisthesis. 5. Cervical spondylosis as outlined within the body of the report. Spinal canal stenosis is greatest at C4-C5 and C5-C6 (at least moderate at these levels). Multilevel  bony neural foraminal narrowing. Electronically Signed   By: Jackey Loge D.O.   On: 10/20/2023 14:55   DG Chest Port 1 View Result Date: 10/20/2023 CLINICAL DATA:  Questionable sepsis. EXAM: PORTABLE CHEST 1 VIEW COMPARISON:  10/01/2023 FINDINGS: Artifact from EKG leads. Low volume chest. There is no edema, consolidation, effusion, or pneumothorax. Generous heart size accentuated by technique and stable. IMPRESSION: Stable low volume chest.  No acute finding. Electronically Signed   By: Tiburcio Pea M.D.   On: 10/20/2023 11:28   CT Head Wo Contrast Result Date: 10/01/2023 CLINICAL DATA:  Mental status change, unknown cause. History of glioblastoma status post resection. EXAM: CT HEAD WITHOUT CONTRAST TECHNIQUE: Contiguous axial images were obtained from the base of the skull through the vertex without intravenous contrast. RADIATION DOSE REDUCTION: This exam was performed according to the departmental dose-optimization program which includes automated exposure control, adjustment of the mA and/or kV according to patient size and/or use of iterative reconstruction technique. COMPARISON:  Head CT and MRI 08/05/2023 FINDINGS: Brain: There is no evidence of an acute infarct, intracranial hemorrhage, mass, midline shift, or extra-axial fluid collection. A resection cavity in the left temporal lobe is similar to the prior CT. A chronic left basal ganglia infarct is unchanged with ex vacuo dilatation of the frontal horn of the left lateral ventricle. Mild hypodensities elsewhere in the cerebral white matter bilaterally are nonspecific but compatible with chronic small vessel ischemic disease. Vascular: Calcified atherosclerosis at the skull base. No hyperdense vessel. Skull: Left perianal craniotomy. Sinuses/Orbits: Visualized paranasal sinuses and mastoid air cells are clear. Unremarkable orbits. Other: None. IMPRESSION: 1. No evidence of acute intracranial abnormality. 2. Chronic small vessel ischemia with a  chronic left basal ganglia infarct. 3. Postoperative changes in the left temporal lobe. Electronically Signed   By: Sebastian Ache M.D.   On: 10/01/2023 16:07   DG Chest 2 View Result Date: 10/01/2023 CLINICAL DATA:  Altered mental status.  Cough. EXAM: CHEST - 2 VIEW COMPARISON:  08/12/2023. FINDINGS: Low lung volume. Bilateral lung fields are clear. Bilateral costophrenic angles are clear. Stable cardio-mediastinal silhouette. No acute osseous abnormalities. The soft tissues are within normal limits. IMPRESSION: No active cardiopulmonary disease. Electronically Signed   By: Jules Schick M.D.   On: 10/01/2023 14:51   ASSESSMENT & PLAN:   BELISA EICHHOLZ is a 73 y.o. person living with a history of glioblastoma s/p resection and radiation therapy who presented after a fall and admitted for altered mental status on hospital day 0  Altered mental status Ground level fall Seizures Unsure etiology for patient's altered mental status. No significant metabolic derangements. CT head unrevealing for structural source. Unlikely due to toxic substance use while in a nursing facility. Patient could have a mild TBI. Also possibly could be related to her history of absence seizures vs postictal state. Code sepsis was called in the ED due to altered mental status and concerning UTI. Lower concern for infectious etiology with no leukocytosis, afebrile, and patient denying urinary or respiratory symptoms.  - Start ceftriaxone 1g q24h - spot EEG ordered -  blood cultures pending - Resume home levetiracetam 1500mg  BID, lacosamide 200mg  BID  Elevated lactate Lactate 2.2 ->2.4. Unsure etiology, possibly due to dehydration from decreased oral intake and altered mental status - S/p 1L LR  - LR infusion at 128mL/hr for 20 hours - Trend lactate  Hypertension Holding home antihypertensives due to borderline hypotension  Hypothyroidism Continue home levothyroxine  Diet: NPO pending swallow study VTE:  SCD Code: Full  Prior to Admission Living Arrangement: SNF Anticipated Discharge Location: pending PT/OT eval Barriers to Discharge: pending medical stability  Signed: Monna Fam, MD Internal Medicine Resident PGY-1  10/20/2023, 5:29 PM

## 2023-10-20 NOTE — Hospital Course (Addendum)
 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 On exam, patient was intermittently alert and oriented with waxing/waning cognition, no other neurological deficits. CT imaging of the head and spine were unrevealing for acute intracranial or spinal abnormalities with no evidence of bleeding. Lactate was initially elevated with improvement after IV fluid resuscitation. Labs were otherwise reassuring.  With more collateral information from family, patient appeared to be at her baseline mental status. She has had significant cognitive deficits and recurrent seizures since she had a glioblastoma resected in November 2024. She has had multiple hospital admissions since then and little significant improvement despite a high level of medical care. Palliative team was consulted, who helped determine patient will continue full code/full scope of treatment with continued GOC discussions once patient is discharged to her SNF. Patient was discharged to her SNF in stable condition.   Functional incontinence Patient already had a foley catheter at the time of admission due to functional incontinence and to help with healing of a sacral wound. The foley was replaced in order to get a better urine sample and left in at the time of discharge.   Seizure disorder There was concern for either status epilepticus or a prolonged postictal state at the time of admission. EEG was suggestive of cortical dysfunction likely secondary to structural abnormality as expected following surgery. No seizures or epileptiform changes seen. Patient's home Keppra, lacosamide, and clobazam were continued throughout the admission.   Stage 1 bilateral heel ulcers: heel elevation to prevent progression of ulcers  Stage 3 sacral ulcer: Wound care was consulted with the following recommendations:  Cleanse entire buttocks/sacral area (intact skin and open wounds) with Vashe wound cleanser (do not rinse and allow to air dry). Apply Medihoney to wound bed daily, cover with dry gauze. Cut a strip of silver hydrofiber ([place]son [phone]) and using a Q tip applicator insert into area of depth (hole) medial buttocks. Cover entire area with silicone foam or ABD pad whichever is preferred. . Foley catheter was left in at time of discharge to assist in wound healing  Elevated lactate: lactate was elevated at time of admission, likely due to dehydration. Lactate resolved to normal levels after IV fluid resuscitation  Hypertension: BP remained low/normal throughout admission, and home antihypertensives were not administered.   Hypothyroidism: Home dose levothyroxine was continued during admission  Constipation: some blood noted during a bowel movement, though to be due to mild ulceration due to constipation. Patient received disimpaction and an enema with more regular bowel movements and no further episodes of blood. Recommend continuing bowel regimen after discharge  Thrombocytopenia: Chronic issue since receiving radiation/chemotherapy. Platelets stable during admission 30k-40k.

## 2023-10-20 NOTE — ED Provider Notes (Signed)
  EMERGENCY DEPARTMENT AT [facility] Provider Note   CSN: [id number] Arrival date & time: 10/20/23  0946     History HTN, pulmonary nodules, DM, seizures Chief Complaint  Patient presents with   Fall   Head Injury    Dana Morton is a 73 y.o. female.  73 y.o female with a PMH of HTN, pulomary nodules, DM, seizures, Brain tumor, dementia presents to the ED via ED [facility] for an unwitnessed fall prior to arrival in the emergency department.  Level 5 caveat due to mental status.  The history is provided by the patient.  Fall  Head Injury      Home Medications Prior to Admission medications   Medication Sig Start Date End Date Taking? Authorizing Provider  acetaminophen (TYLENOL) 500 MG tablet Take 1,000 mg by mouth every 6 (six) hours as needed for mild pain (pain score 1-3).    [provider]  bisoprolol (ZEBETA) 10 MG tablet Take 10 mg by mouth daily.    [provider]  Calcium Carbonate-Vitamin D (CALCIUM 600+D PO) Take 1 tablet by mouth daily.    [provider]  ceFEPIme 2 g in sodium chloride 0.9 % 100 mL Inject 2 g into the vein every 8 (eight) hours. 08/12/23   Rodolph Bong, MD  cetirizine (ZYRTEC) 10 MG tablet Take 10 mg by mouth daily.    [provider]  Cholecalciferol 25 MCG (1000 UT) tablet Take 1,000 Units by mouth daily.    [provider]  cyanocobalamin 1000 MCG tablet Take 1 tablet (1,000 mcg total) by mouth daily. 08/13/23   Rodolph Bong, MD  filgrastim-aafi (NIVESTYM) 480 MCG/0.8ML SOSY injection Inject 0.8 mLs (480 mcg total) into the skin daily. 08/13/23   Rodolph Bong, MD  HYDROcodone-acetaminophen [facility]) 10-325 MG tablet Take 1-2 tablets by mouth 2 (two) times daily as needed. For severe back pain. 07/22/17   [provider]  lacosamide (VIMPAT) 200 MG TABS tablet Take 1 tablet (200 mg total) by mouth 2 (two) times daily. 08/12/23   Rodolph Bong, MD   levETIRAcetam (KEPPRA) 750 MG tablet Take 2 tablets (1,500 mg total) by mouth 2 (two) times daily. 08/12/23   Rodolph Bong, MD  levothyroxine (SYNTHROID) 150 MCG tablet Take 1 tablet (150 mcg total) by mouth daily before breakfast. 08/12/23   Rodolph Bong, MD  lisinopril (PRINIVIL,ZESTRIL) 40 MG tablet Take 40 mg by mouth at bedtime.     [provider]  LORazepam (ATIVAN) 2 MG/ML injection Inject 1 mL (2 mg total) into the vein every 4 (four) hours as needed for seizure. 08/12/23   Rodolph Bong, MD  lovastatin (MEVACOR) 40 MG tablet Take 40 mg by mouth at bedtime.    [provider]  Magnesium 500 MG CAPS Take 500 mg by mouth daily.    [provider]  Multiple Vitamin (MULTIVITAMIN WITH MINERALS) TABS tablet Take 1 tablet by mouth daily. Centrum Silver for Women    [provider]  omeprazole (PRILOSEC) 20 MG capsule Take 20 mg by mouth daily before breakfast.     [provider]  oxymetazoline (AFRIN) 0.05 % nasal spray Place 2 sprays into both nostrils every 20 (twenty) minutes as needed (for epistaxis that wont respond to pressure. notfiy MD after 2 doses). 08/12/23   Rodolph Bong, MD  pantoprazole (PROTONIX) 40 MG tablet Take 1 tablet (40 mg total) by mouth daily. 08/13/23   Ramiro Harvest  V, MD  perampanel (FYCOMPA) 2 MG tablet Take 1 tablet (2 mg total) by mouth at bedtime. 08/12/23   Rodolph Bong, MD      Allergies    Celebrex [celecoxib] and Codeine    Review of Systems   Review of Systems  Unable to perform ROS: Dementia    Physical Exam Updated Vital Signs BP 110/83   Pulse 62   Temp (!) 97.5 F (36.4 C) (Axillary)   Resp 17   Ht 5\' 7"  (1.702 m)   Wt 97.5 kg   SpO2 100%   BMI 33.67 kg/m  Physical Exam Vitals and nursing note reviewed.  Constitutional:      Appearance: She is obese. She is ill-appearing.  HENT:     Head: Normocephalic.     Comments: Bruising over the left eyebrow.      Mouth/Throat:     Mouth: Mucous membranes are dry.  Neck:     Comments: On aspen C collar Cardiovascular:     Rate and Rhythm: Normal rate.  Abdominal:     General: Abdomen is flat.     Palpations: Abdomen is soft.     Comments: Bruising noted over the left side.   Skin:    General: Skin is warm and dry.     Findings: Bruising present.  Neurological:     Mental Status: Mental status is at baseline.     Comments: Moves upper and lower extremities minimally, follows commands.      ED Results / Procedures / Treatments   Labs (all labs ordered are listed, but only abnormal results are displayed) Labs Reviewed  COMPREHENSIVE METABOLIC PANEL WITH GFR - Abnormal; Notable for the following components:      Result Value   Glucose, Bld 103 (*)    BUN 33 (*)    Albumin 3.0 (*)    AST 59 (*)    ALT 78 (*)    All other components within normal limits  CBC WITH DIFFERENTIAL/PLATELET - Abnormal; Notable for the following components:   RBC 3.05 (*)    Hemoglobin 10.4 (*)    HCT 31.4 (*)    MCV 103.0 (*)    MCH 34.1 (*)    RDW 21.7 (*)    Platelets 37 (*)    All other components within normal limits  URINALYSIS, W/ REFLEX TO CULTURE (INFECTION SUSPECTED) - Abnormal; Notable for the following components:   APPearance CLOUDY (*)    Hgb urine dipstick SMALL (*)    Protein, ur 100 (*)    Leukocytes,Ua LARGE (*)    Bacteria, UA MANY (*)    All other components within normal limits  I-STAT CG4 LACTIC ACID, ED - Abnormal; Notable for the following components:   Lactic Acid, Venous 2.2 (*)    All other components within normal limits  I-STAT CG4 LACTIC ACID, ED - Abnormal; Notable for the following components:   Lactic Acid, Venous 2.4 (*)    All other components within normal limits  RESP PANEL BY RT-PCR (RSV, FLU A&B, COVID)  RVPGX2  CULTURE, BLOOD (ROUTINE X 2)  CULTURE, BLOOD (ROUTINE X 2)  URINE CULTURE  PROTIME-INR    EKG EKG Interpretation Date/Time:  Tuesday October 20 2023  09:58:22 EDT Ventricular Rate:  62 PR Interval:  59 QRS Duration:  103 QT Interval:  398 QTC Calculation: 405 R Axis:   -20  Text Interpretation: Sinus rhythm Short PR interval Consider right atrial enlargement Borderline left axis deviation Low voltage,  precordial leads Confirmed by Pricilla Loveless [phone]) on 10/20/2023 10:58:30 AM  Radiology CT HEAD WO CONTRAST ( ) Result Date: 10/20/2023 CLINICAL DATA:  Provided history: Facial trauma, blunt. Additional history provided: Unwitnessed fall (striking left side of head above eye). EXAM: CT HEAD WITHOUT CONTRAST CT MAXILLOFACIAL WITHOUT CONTRAST CT CERVICAL SPINE WITHOUT CONTRAST TECHNIQUE: Multidetector CT imaging of the head, cervical spine, and maxillofacial structures were performed using the standard protocol without intravenous contrast. Multiplanar CT image reconstructions of the cervical spine and maxillofacial structures were also generated. RADIATION DOSE REDUCTION: This exam was performed according to the departmental dose-optimization program which includes automated exposure control, adjustment of the mA and/or kV according to patient size and/or use of iterative reconstruction technique. COMPARISON:  Head CT 10/01/2023.  Brain MRI 08/05/2023. FINDINGS: CT HEAD FINDINGS Brain: Generalized cerebral atrophy. Resection cavity within the left temporal lobe, unchanged in appearance as compared to the recent prior head CT of 10/01/2023. 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 Mild patchy and ill-defined hypoattenuation elsewhere within the cerebral white matter, nonspecific but compatible with chronic small ischemic disease. There is no acute intracranial hemorrhage. No demarcated cortical infarct. No extra-axial fluid collection. No midline shift. Vascular: No hyperdense vessel.  Atherosclerotic calcifications. Skull: No evidence of an acute calvarial fracture. Left temporoparietal cranioplasty. CT MAXILLOFACIAL  FINDINGS Osseous: No evidence of an acute maxillofacial fracture. Orbits: Left anterior scalp/periorbital hematoma. No acute finding within the orbits. Sinuses: Small volume fluid within the right sphenoid sinus. Soft tissues: Left anterior scalp/periorbital hematoma. CT CERVICAL SPINE FINDINGS Alignment: Levocurvature of the cervical spine. Nonspecific reversal of the expected cervical lordosis. 2 mm C2-C3 grade 1 anterolisthesis. Skull base and vertebrae: The basion-dental and atlanto-dental intervals are maintained.No evidence of acute fracture to the cervical spine. Soft tissues and spinal canal: No prevertebral fluid or swelling. No visible canal hematoma. Disc levels: Cervical spondylosis with multilevel disc space narrowing, disc bulges/central disc protrusions, posterior disc osteophyte complexes, uncovertebral hypertrophy and facet arthropathy. Disc space narrowing is greatest at C4-C5, C5-C6 and C6-C7 (moderate at these levels). Multilevel spinal canal stenosis. Most notably, posterior disc osteophyte complexes contribute to at least moderate spinal canal stenosis at C4-C5 and C5-C6. Multilevel bony neural foraminal narrowing. Multilevel ventral osteophytes. Degenerative changes also present at the C1-C2 articulation. Upper chest: No consolidation within the imaged lung apices. No visible pneumothorax. IMPRESSION: CT head: 1.  No evidence of an acute intracranial abnormality. 2. Postoperative changes within the left temporal lobe. 3. Parenchymal atrophy and chronic small vessel ischemic disease. CT maxillofacial: 1. No evidence of an acute maxillofacial fracture. 2. Left anterior scalp/periorbital hematoma. 3. Mild right sphenoid sinusitis. CT cervical spine: 1. No evidence of an acute cervical spine fracture. 2. Nonspecific reversal of the expected cervical lordosis. 3. Levocurvature of the cervical spine. 4. Mild C2-C3 grade 1 anterolisthesis. 5. Cervical spondylosis as outlined within the body of the  report. Spinal canal stenosis is greatest at C4-C5 and C5-C6 (at least moderate at these levels). Multilevel bony neural foraminal narrowing. Electronically Signed   By: Jackey Loge D.O.   On: 10/20/2023 14:55   CT Cervical Spine Wo Contrast Result Date: 10/20/2023 CLINICAL DATA:  Provided history: Facial trauma, blunt. Additional history provided: Unwitnessed fall (striking left side of head above eye). EXAM: CT HEAD WITHOUT CONTRAST CT MAXILLOFACIAL WITHOUT CONTRAST CT CERVICAL SPINE WITHOUT CONTRAST TECHNIQUE: Multidetector CT imaging of the head, cervical spine, and maxillofacial structures were performed using the standard protocol without intravenous contrast. Multiplanar CT image  reconstructions of the cervical spine and maxillofacial structures were also generated. RADIATION DOSE REDUCTION: This exam was performed according to the departmental dose-optimization program which includes automated exposure control, adjustment of the mA and/or kV according to patient size and/or use of iterative reconstruction technique. COMPARISON:  Head CT 10/01/2023.  Brain MRI 08/05/2023. FINDINGS: CT HEAD FINDINGS Brain: Generalized cerebral atrophy. Resection cavity within the left temporal lobe, unchanged in appearance as compared to the recent prior head CT of 10/01/2023. 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 Mild patchy and ill-defined hypoattenuation elsewhere within the cerebral white matter, nonspecific but compatible with chronic small ischemic disease. There is no acute intracranial hemorrhage. No demarcated cortical infarct. No extra-axial fluid collection. No midline shift. Vascular: No hyperdense vessel.  Atherosclerotic calcifications. Skull: No evidence of an acute calvarial fracture. Left temporoparietal cranioplasty. CT MAXILLOFACIAL FINDINGS Osseous: No evidence of an acute maxillofacial fracture. Orbits: Left anterior scalp/periorbital hematoma. No acute finding within  the orbits. Sinuses: Small volume fluid within the right sphenoid sinus. Soft tissues: Left anterior scalp/periorbital hematoma. CT CERVICAL SPINE FINDINGS Alignment: Levocurvature of the cervical spine. Nonspecific reversal of the expected cervical lordosis. 2 mm C2-C3 grade 1 anterolisthesis. Skull base and vertebrae: The basion-dental and atlanto-dental intervals are maintained.No evidence of acute fracture to the cervical spine. Soft tissues and spinal canal: No prevertebral fluid or swelling. No visible canal hematoma. Disc levels: Cervical spondylosis with multilevel disc space narrowing, disc bulges/central disc protrusions, posterior disc osteophyte complexes, uncovertebral hypertrophy and facet arthropathy. Disc space narrowing is greatest at C4-C5, C5-C6 and C6-C7 (moderate at these levels). Multilevel spinal canal stenosis. Most notably, posterior disc osteophyte complexes contribute to at least moderate spinal canal stenosis at C4-C5 and C5-C6. Multilevel bony neural foraminal narrowing. Multilevel ventral osteophytes. Degenerative changes also present at the C1-C2 articulation. Upper chest: No consolidation within the imaged lung apices. No visible pneumothorax. IMPRESSION: CT head: 1.  No evidence of an acute intracranial abnormality. 2. Postoperative changes within the left temporal lobe. 3. Parenchymal atrophy and chronic small vessel ischemic disease. CT maxillofacial: 1. No evidence of an acute maxillofacial fracture. 2. Left anterior scalp/periorbital hematoma. 3. Mild right sphenoid sinusitis. CT cervical spine: 1. No evidence of an acute cervical spine fracture. 2. Nonspecific reversal of the expected cervical lordosis. 3. Levocurvature of the cervical spine. 4. Mild C2-C3 grade 1 anterolisthesis. 5. Cervical spondylosis as outlined within the body of the report. Spinal canal stenosis is greatest at C4-C5 and C5-C6 (at least moderate at these levels). Multilevel bony neural foraminal narrowing.  Electronically Signed   By: Jackey Loge D.O.   On: 10/20/2023 14:55   CT Maxillofacial Wo Contrast Result Date: 10/20/2023 CLINICAL DATA:  Provided history: Facial trauma, blunt. Additional history provided: Unwitnessed fall (striking left side of head above eye). EXAM: CT HEAD WITHOUT CONTRAST CT MAXILLOFACIAL WITHOUT CONTRAST CT CERVICAL SPINE WITHOUT CONTRAST TECHNIQUE: Multidetector CT imaging of the head, cervical spine, and maxillofacial structures were performed using the standard protocol without intravenous contrast. Multiplanar CT image reconstructions of the cervical spine and maxillofacial structures were also generated. RADIATION DOSE REDUCTION: This exam was performed according to the departmental dose-optimization program which includes automated exposure control, adjustment of the mA and/or kV according to patient size and/or use of iterative reconstruction technique. COMPARISON:  Head CT 10/01/2023.  Brain MRI 08/05/2023. FINDINGS: CT HEAD FINDINGS Brain: Generalized cerebral atrophy. Resection cavity within the left temporal lobe, unchanged in appearance as compared to the recent prior head CT  of 10/01/2023. 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 Mild patchy and ill-defined hypoattenuation elsewhere within the cerebral white matter, nonspecific but compatible with chronic small ischemic disease. There is no acute intracranial hemorrhage. No demarcated cortical infarct. No extra-axial fluid collection. No midline shift. Vascular: No hyperdense vessel.  Atherosclerotic calcifications. Skull: No evidence of an acute calvarial fracture. Left temporoparietal cranioplasty. CT MAXILLOFACIAL FINDINGS Osseous: No evidence of an acute maxillofacial fracture. Orbits: Left anterior scalp/periorbital hematoma. No acute finding within the orbits. Sinuses: Small volume fluid within the right sphenoid sinus. Soft tissues: Left anterior scalp/periorbital hematoma. CT CERVICAL  SPINE FINDINGS Alignment: Levocurvature of the cervical spine. Nonspecific reversal of the expected cervical lordosis. 2 mm C2-C3 grade 1 anterolisthesis. Skull base and vertebrae: The basion-dental and atlanto-dental intervals are maintained.No evidence of acute fracture to the cervical spine. Soft tissues and spinal canal: No prevertebral fluid or swelling. No visible canal hematoma. Disc levels: Cervical spondylosis with multilevel disc space narrowing, disc bulges/central disc protrusions, posterior disc osteophyte complexes, uncovertebral hypertrophy and facet arthropathy. Disc space narrowing is greatest at C4-C5, C5-C6 and C6-C7 (moderate at these levels). Multilevel spinal canal stenosis. Most notably, posterior disc osteophyte complexes contribute to at least moderate spinal canal stenosis at C4-C5 and C5-C6. Multilevel bony neural foraminal narrowing. Multilevel ventral osteophytes. Degenerative changes also present at the C1-C2 articulation. Upper chest: No consolidation within the imaged lung apices. No visible pneumothorax. IMPRESSION: CT head: 1.  No evidence of an acute intracranial abnormality. 2. Postoperative changes within the left temporal lobe. 3. Parenchymal atrophy and chronic small vessel ischemic disease. CT maxillofacial: 1. No evidence of an acute maxillofacial fracture. 2. Left anterior scalp/periorbital hematoma. 3. Mild right sphenoid sinusitis. CT cervical spine: 1. No evidence of an acute cervical spine fracture. 2. Nonspecific reversal of the expected cervical lordosis. 3. Levocurvature of the cervical spine. 4. Mild C2-C3 grade 1 anterolisthesis. 5. Cervical spondylosis as outlined within the body of the report. Spinal canal stenosis is greatest at C4-C5 and C5-C6 (at least moderate at these levels). Multilevel bony neural foraminal narrowing. Electronically Signed   By: Jackey Loge D.O.   On: 10/20/2023 14:55   DG Chest Port 1 View Result Date: 10/20/2023 CLINICAL DATA:   Questionable sepsis. EXAM: PORTABLE CHEST 1 VIEW COMPARISON:  10/01/2023 FINDINGS: Artifact from EKG leads. Low volume chest. There is no edema, consolidation, effusion, or pneumothorax. Generous heart size accentuated by technique and stable. IMPRESSION: Stable low volume chest.  No acute finding. Electronically Signed   By: Tiburcio Pea M.D.   On: 10/20/2023 11:28    Procedures .Critical Care  Performed by: Claude Manges, PA-C Authorized by: Claude Manges, PA-C   Critical care provider statement:    Critical care time (minutes):  30   Critical care start time:  10/20/2023 1:30 PM   Critical care end time:  10/20/2023 2:15 PM   Critical care was necessary to treat or prevent imminent or life-threatening deterioration of the following conditions:  Sepsis   Critical care was time spent personally by me on the following activities:  Development of treatment plan with patient or surrogate, discussions with consultants, evaluation of patient's response to treatment, examination of patient, ordering and review of laboratory studies, ordering and review of radiographic studies, ordering and performing treatments and interventions, pulse oximetry, re-evaluation of patient's condition and review of old charts     Medications Ordered in ED Medications  lactated ringers infusion ( Intravenous New Bag/Given 10/20/23 1154)  lactated  ringers bolus 1,000 mL (0 mLs Intravenous Stopped 10/20/23 1152)  ceFEPIme (MAXIPIME) 2 g in sodium chloride 0.9 % 100 mL IVPB (0 g Intravenous Stopped 10/20/23 1135)  metroNIDAZOLE (FLAGYL) IVPB 500 mg (0 mg Intravenous Stopped 10/20/23 1238)  vancomycin (VANCOREADY) IVPB 2000 mg/400 mL (0 mg Intravenous Stopped 10/20/23 1257)    ED Course/ Medical Decision Making/ A&P Clinical Course as of 10/20/23 1525  Tue Oct 20, 2023  1311 Leukocytes,Ua(!): LARGE [JS]  1311 Bacteria, UA(!): MANY [JS]  1311 Hgb urine dipstick(!): SMALL [JS]  1311 WBC, UA: >50 [JS]  1459 Lactic Acid,  Venous(!!): 2.4 [JS]    Clinical Course User Index [JS] Claude Manges, PA-C                                 Medical Decision Making Amount and/or Complexity of Data Reviewed Labs: ordered. Decision-making details documented in ED Course. Radiology: ordered.  Risk Prescription drug management.    This patient presents to the ED for concern of fall, this involves a number of treatment options, and is a complaint that carries with it a high risk of complications and morbidity.  The differential diagnosis includes AMS, seizures versus mechanical fall.   Co morbidities: Discussed in HPI   Brief History:  See HPI.   EMR reviewed including pt PMHx, past surgical history and past visits to ER.   See HPI for more details   Lab Tests:  I ordered and independently interpreted labs.  The pertinent results include:    CBC with no leukocytosis, hemoglobin slightly decreased.  CMP with no electrolyte derangement, creatinine levels unremarkable.  PT/INR is normal.  Lactic acid is elevated at 2.4.  UA is remarkable for large leukocytes, many bacteria, greater than 50 white blood cell count consistent with likely urinary tract infection, will send this for urine culture.  Imaging Studies:  CT head:    1.  No evidence of an acute intracranial abnormality.  2. Postoperative changes within the left temporal lobe.  3. Parenchymal atrophy and chronic small vessel ischemic disease.    CT maxillofacial:    1. No evidence of an acute maxillofacial fracture.  2. Left anterior scalp/periorbital hematoma.  3. Mild right sphenoid sinusitis.    CT cervical spine:    1. No evidence of an acute cervical spine fracture.  2. Nonspecific reversal of the expected cervical lordosis.  3. Levocurvature of the cervical spine.  4. Mild C2-C3 grade 1 anterolisthesis.  5. Cervical spondylosis as outlined within the body of the report.  Spinal canal stenosis is greatest at C4-C5 and C5-C6 (at least   moderate at these levels). Multilevel bony neural foraminal  narrowing.    Cardiac Monitoring:  The patient was maintained on a cardiac monitor.  I personally viewed and interpreted the cardiac monitored which showed an underlying rhythm of: NSR 65 EKG non-ischemic   Medicines ordered:  I ordered medication including LR  for hypotension Reevaluation of the patient after these medicines showed that the patient improved I have reviewed the patients home medicines and have made adjustments as needed   Critical Interventions:   Patient was made a code sepsis with hypotension, unwitnessed fall, broad-spectrum antibiotics were obtained after obtaining a Actiq acid of 2.  She does have large leukocytes, many bacteria, small globin and greater than 50 white blood cell count.  Treated empirically with Flagyl, vancomycin, cefepime.   Social Determinants of Health:  The patient's social determinants of health were a factor in the care of this patient  Problem List / ED Course:  Patient arrives from facility for hypotension, status post unwitnessed fall, patient's husband at the bedside is providing most of the history, patient rolled over from the bed onto the ground.  She does have a visible left facial bruising.  She is hypotensive, CBC with no leukocytosis, hemoglobin is improved from prior.  CMP with no electrolyte abnormality, creatinine levels unremarkable.  LFTs are slightly elevated.  Her lactic acid was elevated at 2.4, she was given fluids to help with blood pressure.  She showed signs of sepsis on presentation therefore code sepsis was activated, she was given antibiotics for unknown source such as vancomycin, Flagyl, cefepime. UA does show large leukocytes, many bacteria, greater than 50 white blood cell count suspect urinary source.  CT head, CT cervical spine, CT maxillofacial did not show any acute findings.  X-ray of her chest did not show any acute findings.  I discussed this  case with my attending Dr. Cherylynn Ridges who agrees on patient admission at this time.  She is not anticoagulated, blood pressure has maintain, although she has gotten fluids I do feel that due to her hypotension needs to be admitted at this time.  Patient is hemodynamically stable for admission.   Dispostion:  After consideration of the diagnostic results and the patients response to treatment, I feel that the patent would benefit from admission for further management of low pressure and syncope versus fall.     Portions of this note were generated with Scientist, clinical (histocompatibility and immunogenetics). Dictation errors may occur despite best attempts at proofreading.  Final Clinical Impression(s) / ED Diagnoses Final diagnoses:  Injury of head, initial encounter  Fall, initial encounter  Sepsis, due to unspecified organism, unspecified whether acute organ dysfunction present [facility])    Rx / DC Orders ED Discharge Orders     None         Claude Manges, PA-C 10/20/23 1525    Pricilla Loveless, MD 10/22/23 1606

## 2023-10-20 NOTE — ED Provider Notes (Signed)
 Angiocath insertion Performed by: Audree Camel  Consent: Verbal consent obtained. Risks and benefits: risks, benefits and alternatives were discussed Time out: Immediately prior to procedure a "time out" was called to verify the correct patient, procedure, equipment, support staff and site/side marked as required.  Preparation: Patient was prepped and draped in the usual sterile fashion.  Vein Location: left basilic  Ultrasound Guided  Gauge: 18  Normal blood return and flush without difficulty Patient tolerance: Patient tolerated the procedure well with no immediate complications.    Angiocath insertion Performed by: Audree Camel  Consent: Verbal consent obtained. Risks and benefits: risks, benefits and alternatives were discussed Time out: Immediately prior to procedure a "time out" was called to verify the correct patient, procedure, equipment, support staff and site/side marked as required.  Preparation: Patient was prepped and draped in the usual sterile fashion.  Vein Location: right basilic  Ultrasound Guided  Gauge: 20  Normal blood return and flush without difficulty Patient tolerance: Patient tolerated the procedure well with no immediate complications.     Pricilla Loveless, MD 10/20/23 1041

## 2023-10-20 NOTE — Sepsis Progress Note (Signed)
 Elink will follow per sepsis protocol.

## 2023-10-21 ENCOUNTER — Encounter (HOSPITAL_COMMUNITY): Payer: Self-pay | Admitting: Internal Medicine

## 2023-10-21 ENCOUNTER — Inpatient Hospital Stay (HOSPITAL_COMMUNITY)

## 2023-10-21 DIAGNOSIS — A419 Sepsis, unspecified organism: Secondary | ICD-10-CM | POA: Diagnosis not present

## 2023-10-21 DIAGNOSIS — C719 Malignant neoplasm of brain, unspecified: Secondary | ICD-10-CM | POA: Diagnosis not present

## 2023-10-21 DIAGNOSIS — R339 Retention of urine, unspecified: Secondary | ICD-10-CM | POA: Diagnosis present

## 2023-10-21 DIAGNOSIS — S0093XA Contusion of unspecified part of head, initial encounter: Secondary | ICD-10-CM | POA: Diagnosis present

## 2023-10-21 DIAGNOSIS — E861 Hypovolemia: Secondary | ICD-10-CM | POA: Diagnosis not present

## 2023-10-21 DIAGNOSIS — L8990 Pressure ulcer of unspecified site, unspecified stage: Secondary | ICD-10-CM | POA: Diagnosis present

## 2023-10-21 DIAGNOSIS — W06XXXA Fall from bed, initial encounter: Secondary | ICD-10-CM | POA: Diagnosis present

## 2023-10-21 DIAGNOSIS — R5381 Other malaise: Secondary | ICD-10-CM | POA: Diagnosis present

## 2023-10-21 LAB — URINALYSIS, ROUTINE W REFLEX MICROSCOPIC
Bilirubin Urine: NEGATIVE
Glucose, UA: NEGATIVE mg/dL
Ketones, ur: NEGATIVE mg/dL
Nitrite: NEGATIVE
Protein, ur: NEGATIVE mg/dL
Specific Gravity, Urine: 1.004 — ABNORMAL LOW (ref 1.005–1.030)
pH: 7 (ref 5.0–8.0)

## 2023-10-21 LAB — BASIC METABOLIC PANEL WITH GFR
Anion gap: 12 (ref 5–15)
BUN: 21 mg/dL (ref 8–23)
CO2: 24 mmol/L (ref 22–32)
Calcium: 9.1 mg/dL (ref 8.9–10.3)
Chloride: 104 mmol/L (ref 98–111)
Creatinine, Ser: 0.74 mg/dL (ref 0.44–1.00)
GFR, Estimated: >60 mL/min (ref >60)
Glucose, Bld: 98 mg/dL (ref 70–99)
Potassium: 3.5 mmol/L (ref 3.5–5.1)
Sodium: 140 mmol/L (ref 135–145)

## 2023-10-21 LAB — CBC
HCT: 29.3 % — ABNORMAL LOW (ref 36.0–46.0)
Hemoglobin: 9.7 g/dL — ABNORMAL LOW (ref 12.0–15.0)
MCH: 33.4 pg (ref 26.0–34.0)
MCHC: 33.1 g/dL (ref 30.0–36.0)
MCV: 101 fL — ABNORMAL HIGH (ref 80.0–100.0)
Platelets: 32 10*3/uL — ABNORMAL LOW (ref 150–400)
RBC: 2.9 MIL/uL — ABNORMAL LOW (ref 3.87–5.11)
RDW: 21.8 % — ABNORMAL HIGH (ref 11.5–15.5)
WBC: 4 10*3/uL (ref 4.0–10.5)
nRBC: 0.5 % — ABNORMAL HIGH (ref 0.0–0.2)

## 2023-10-21 LAB — TSH: TSH: 7.767 u[IU]/mL — ABNORMAL HIGH (ref 0.350–4.500)

## 2023-10-21 LAB — LACTIC ACID, PLASMA: Lactic Acid, Venous: 1.4 mmol/L (ref 0.5–1.9)

## 2023-10-21 MED ORDER — CHLORHEXIDINE GLUCONATE CLOTH 2 % EX PADS
6.0000 | MEDICATED_PAD | Freq: Every day | CUTANEOUS | Status: DC
  Administered 2023-10-21 – 2023-10-26 (×6): 6 via TOPICAL

## 2023-10-21 MED ORDER — OXYCODONE HCL 5 MG PO TABS
5.0000 mg | ORAL_TABLET | Freq: Once | ORAL | Status: DC
  Filled 2023-10-21: qty 1

## 2023-10-21 MED ORDER — ZINC OXIDE 40 % EX OINT
TOPICAL_OINTMENT | Freq: Two times a day (BID) | CUTANEOUS | Status: DC
  Filled 2023-10-21 (×2): qty 57

## 2023-10-21 MED ORDER — ACETAMINOPHEN 325 MG PO TABS
650.0000 mg | ORAL_TABLET | Freq: Three times a day (TID) | ORAL | Status: DC
  Administered 2023-10-22 – 2023-10-25 (×11): 650 mg via ORAL
  Filled 2023-10-21 (×13): qty 2

## 2023-10-21 MED ORDER — MEDIHONEY WOUND/BURN DRESSING EX PSTE
1.0000 | PASTE | Freq: Every day | CUTANEOUS | Status: DC
  Administered 2023-10-21 – 2023-10-26 (×6): 1 via TOPICAL
  Filled 2023-10-21 (×2): qty 44

## 2023-10-21 NOTE — Plan of Care (Signed)
    Referral received for Dana Morton : recurrent hospital admissions and failure to thrive. Family would like to discuss palliative care, available for a meeting tomorrow if possible.   Chart reviewed in detail.   I was able to speak with patient's husband Kathlene November. GOC meeting scheduled for 4/10 @ 10:30am when daughter Linda Hedges can be present. Family is aware we will meet at patient's bedside.   Detailed note and recommendations to follow once GOC has been completed.   Thank you for your referral and allowing PMT to assist in Mrs. Keari J Toves's care.   Richardson Dopp, [facility] Palliative Medicine Team  Team Phone # [phone]   NO CHARGE

## 2023-10-21 NOTE — Progress Notes (Addendum)
 Skin assessment performed at beside. Stage 2 pressure injuries noted on left and right buttocks. Noticeable tunneling on right buttocks with minor indention on left buttocks. Photo and measurements taken and added to chart. Wound treated per orders. Will continue to monitor.   Follow up: Inflatable bed ordered for patient. Primary nurse notified.

## 2023-10-21 NOTE — Progress Notes (Signed)
 EEG complete - results pending

## 2023-10-21 NOTE — Consult Note (Signed)
 WOC Nurse Consult Note: Reason for Consult: sacral wound with hole  Wound type: Stage 3 Pressure Injury sacrum; unstageable PI medial buttocks  Pressure Injury POA: Yes Measurement: see nursing flowsheet  Wound bed: one ulceration 50% pink 50% yellow, 2 medial ulcerations 100% yellow; one area of depth unable to visualize wound bed  Drainage (amount, consistency, odor) appears tan exudate on old dressing  Periwound: erythema, moisture associated skin damage  Dressing procedure/placement/frequency: Cleanse entire buttocks/sacral area (intact skin and open wounds) with Vashe wound cleanser (do not rinse and allow to air dry).  Apply Medihoney to wound bed daily, cover with dry gauze.  Cut a strip of silver hydrofiber [place] [phone]) and using a Q tip applicator insert into area of depth (hole) medial buttocks.  Cover entire area with silicone foam or ABD pad whichever is preferred.   POC discussed with bedside nurse. WOc team will not follow. RE-consult if further needs arise.   Thank you,    Priscella Mann MSN, RN-BC, [employer] [phone]

## 2023-10-21 NOTE — Consult Note (Signed)
 WOC team consulted for sacral wound.   Please note that the [facility] nursing team is utilizing a standardized work plan to manage patient consults. We are triaging consults and will try to see the patients within 48 hours. Wound photos in the patient's chart allow [place] to consult on the patient in the most efficient and timely manner.    Thank you,    Priscella Mann MSN, RN-BC, [employer] [phone]

## 2023-10-21 NOTE — TOC Progression Note (Signed)
 Transition of Care [facility]) - Progression Note    Patient Details  Name: Dana Morton MRN: [id number] Date of Birth: Dec 25, 1950  Transition of Care [facility]) CM/SW Contact  Eduard Roux, [place] Phone Number: 10/21/2023, 11:18 AM  Clinical Narrative:      CSW spoke with patient's spouse. He confirmed patient arrived from [facility] and is expected to return once medically stable.   TOC will continue to follow and assist with discharge planning.  Antony Blackbird, MSW, LCSW Clinical Social Worker      Expected Discharge Plan and Services                                               Social Determinants of Health (SDOH) Interventions SDOH Screenings   Food Insecurity: Patient Unable To Answer (10/20/2023)  Housing: Patient Unable To Answer (10/20/2023)  Transportation Needs: Patient Unable To Answer (10/20/2023)  Utilities: Patient Unable To Answer (10/20/2023)  Financial Resource Strain: Low Risk  (10/03/2023)   Received from [facility]  Social Connections: Unknown (10/20/2023)  Recent Concern: Social Connections - Moderately Isolated (08/08/2023)  Tobacco Use: Low Risk  (10/01/2023)    Readmission Risk Interventions     No data to display

## 2023-10-21 NOTE — Progress Notes (Signed)
                  Subjective:   Summary: Patient is a 73 year old woman with a history of glioblastoma status postresection and radiation therapy, recurrent seizures, multiple recent hospital admissions, brought in by EMS from her nursing facility for altered mental status after a ground-level fall.  Patient was much more alert and mentally active today.  She was able to answer orientation questions and participate in interview.  Not having any current pain.  Very distressed about her room and bathroom, which her son at bedside informs [place] is a frequent occurrence for her at her various medical facilities.  Objective:  Vital signs in last 24 hours: Vitals:   10/21/23 0156 10/21/23 0215 10/21/23 0325 10/21/23 0800  BP: (!) 105/49 (!) 116/41 (!) 110/47 (!) 115/50  Pulse:  83 84 85  Resp: (!) 22 18 16 19   Temp: 98.1 F (36.7 C) 97.9 F (36.6 C) 97.6 F (36.4 C) 99.5 F (37.5 C)  TempSrc: Oral Oral Oral Oral  SpO2: 97% 96% 96% 95%  Weight:      Height:       Supplemental O2: Room Air SpO2: 95 %  Physical Exam:  Constitutional: Chronically ill-appearing. No acute distress Cardiovascular: Regular rate and rhythm, no murmurs Pulmonary/Chest: Lungs are clear to auscultation bilaterally, no respiratory distress Abdominal: Soft, nontender, nondistended Skin: Stage I pressure ulcers on bilateral heels. Erythematous nonblanching skin of both posterior heels Neuro: A&O x 2  Assessment/Plan:   Altered mental status Ground level fall Hx glioblastoma s/p resection and radiation with residual deficits Patient's son at bedside was able to provide more collateral. He states his mother has had significant confusion and often waxing/waning cognitive status since shortly after her brain surgery. She has had multiple hospital admissions and has failed to medically improve even with a continued high level of medical care. I do not feel there are any significant reversible causes for her altered  mental status.  Overall this patient's long-term prognosis is poor, and she would benefit from a palliative consult to discuss options with her family. - Palliative consult placed. Family is available for a family meeting tomorrow if possible to meet then  Functional incontinence Patient has had a long-term Foley catheter due to functional incontinence.  Catheter was replaced today and a repeat UA performed.  New UA was reassuring with no evidence of infection. No indication for further antibiotic treatment. She had a voiding trial scheduled for tomorrow, may attempt tomorrow.  Seizures -Continue home Keppra 1500mg  BID, lacosamide 200mg  daily   Elevated lactate, resolved Lactate improved to normal levels after 1 L bolus in the ED and IV fluid infusion overnight. Patient has been eating and drinking appropriately. No further IV fluids indicated at this time  Stage 1 bilateral heel ulcers Physical exam shows nonblanching erythema of the bilateral posterior heels. - Heel elevation  Hypertension: Holding home antihypertensives Hypothyroidism: Continue home levothyroxine  Diet: Dys 3 VTE: SCD Code: Full  Dispo: Anticipated discharge to Skilled nursing facility pending medical stability.   Monna Fam, MD PGY-1 Internal Medicine Resident Pager Number [phone] Please contact the on call pager after 5 pm and on weekends at [phone].

## 2023-10-22 DIAGNOSIS — R4182 Altered mental status, unspecified: Secondary | ICD-10-CM

## 2023-10-22 DIAGNOSIS — R5381 Other malaise: Secondary | ICD-10-CM

## 2023-10-22 DIAGNOSIS — Z7189 Other specified counseling: Secondary | ICD-10-CM | POA: Diagnosis not present

## 2023-10-22 DIAGNOSIS — C719 Malignant neoplasm of brain, unspecified: Secondary | ICD-10-CM | POA: Diagnosis not present

## 2023-10-22 DIAGNOSIS — E861 Hypovolemia: Secondary | ICD-10-CM | POA: Diagnosis not present

## 2023-10-22 DIAGNOSIS — R569 Unspecified convulsions: Secondary | ICD-10-CM

## 2023-10-22 DIAGNOSIS — Z515 Encounter for palliative care: Secondary | ICD-10-CM

## 2023-10-22 DIAGNOSIS — A419 Sepsis, unspecified organism: Secondary | ICD-10-CM | POA: Diagnosis not present

## 2023-10-22 DIAGNOSIS — R339 Retention of urine, unspecified: Secondary | ICD-10-CM

## 2023-10-22 DIAGNOSIS — R410 Disorientation, unspecified: Secondary | ICD-10-CM

## 2023-10-22 LAB — GLUCOSE, CAPILLARY: Glucose-Capillary: 121 mg/dL — ABNORMAL HIGH (ref 70–99)

## 2023-10-22 MED ORDER — CLOBAZAM 10 MG PO TABS
10.0000 mg | ORAL_TABLET | Freq: Two times a day (BID) | ORAL | Status: DC
  Administered 2023-10-22 – 2023-10-26 (×9): 10 mg via ORAL
  Filled 2023-10-22: qty 1
  Filled 2023-10-22: qty 2
  Filled 2023-10-22 (×2): qty 1
  Filled 2023-10-22 (×5): qty 2

## 2023-10-22 NOTE — Plan of Care (Signed)
   Problem: Health Behavior/Discharge Planning: Goal: Ability to manage health-related needs will improve Outcome: Progressing   Problem: Clinical Measurements: Goal: Ability to maintain clinical measurements within normal limits will improve Outcome: Progressing Goal: Diagnostic test results will improve Outcome: Progressing

## 2023-10-22 NOTE — Progress Notes (Addendum)
                  Subjective:   Summary: Patient is a 73 year old woman with a history of glioblastoma status postresection and radiation therapy, recurrent seizures, multiple recent hospital admissions, brought in by EMS from her nursing facility for altered mental status after a fall from bed.  Patient reports feeling well today. She appears more alert and talkative, still pleasantly confused. No acute complaints.   Objective:  Vital signs in last 24 hours: Vitals:   10/21/23 0800 10/22/23 0000 10/22/23 0343 10/22/23 0750  BP: (!) 115/50 120/62 115/69 (!) 110/57  Pulse: 85 90 93   Resp: 19   18  Temp: 99.5 F (37.5 C) 98 F (36.7 C) 98.2 F (36.8 C) 98.3 F (36.8 C)  TempSrc: Oral Oral Axillary Oral  SpO2: 95% 97% 92% 92%  Weight:      Height:       Supplemental O2: Room Air SpO2: 92 %  Physical Exam:  Constitutional: Chronically ill-appearing. No acute distress Neuro: A&O x 1  Assessment/Plan:   Altered mental status Ground level fall Hx glioblastoma s/p resection and radiation with residual deficits Patient appears to be at her baseline cognitive status. Palliative team will be holding a family meeting to discuss goals of care regarding this patient with poor functional status, recurrent hospitalizations, and failure to improve with high level medical care. This conversation will hopefully help guide disposition planning and future medical care.   Functional incontinence Continuing foley catheter at this time, may plan for a voiding trial once disposition becomes more clear (though if foley is being maintained to help with healing of her sacral wound, that would be a different scenario).  Seizures EEG suggestive of cortical dysfunction likely secondary to structural abnormality as expected following surgery. No seizures or epileptiform changes seen.  -Continue home Keppra 1500mg  BID, lacosamide 200mg  daily, clobazam 10mg  BID  Stage 1 bilateral heel ulcers Physical  exam shows nonblanching erythema of the bilateral posterior heels. - Heel elevation  Sacral wound stage 3  Elevated lactate, resolved: no further IV fluids indicated  Hypertension not requiring treatment at this time: Holding home antihypertensives Hypothyroidism: Continue home levothyroxine  Diet: Dys 3 VTE: SCD Code: Full  Dispo: Anticipated discharge to Skilled nursing facility pending GOC conversation and dispo plan  Monna Fam, MD PGY-1 Internal Medicine Resident Pager Number [phone] Please contact the on call pager after 5 pm and on weekends at [phone].

## 2023-10-22 NOTE — TOC Progression Note (Signed)
 Transition of Care [facility]) - Progression Note    Patient Details  Name: Dana Morton MRN: [id number] Date of Birth: 06/25/1951  [facility] CM/SW Contact  Eduard Roux, [place] Phone Number: 10/22/2023, 2:41 PM  Clinical Narrative:     CSW informed patient close to being medically stable for d/c. Left voice message with Malvin Johns- waiting on call back  Patient will need PT/OT eval, insurance auth and bed conformation before she can return to [facility]   Antony Blackbird, MSW, LCSW Clinical Social Worker    Expected Discharge Plan: Skilled Nursing Facility Barriers to Discharge: Insurance Authorization (bed confirmation from [employer])  Expected Discharge Plan and Services In-house Referral: Clinical Social Work                                             Social Determinants of Health (SDOH) Interventions SDOH Screenings   Food Insecurity: Patient Unable To Answer (10/20/2023)  Housing: Patient Unable To Answer (10/20/2023)  Transportation Needs: Patient Unable To Answer (10/20/2023)  Utilities: Patient Unable To Answer (10/20/2023)  Financial Resource Strain: Low Risk  (10/03/2023)   Received from [facility]  Social Connections: Unknown (10/20/2023)  Recent Concern: Social Connections - Moderately Isolated (08/08/2023)  Tobacco Use: Low Risk  (10/01/2023)    Readmission Risk Interventions     No data to display

## 2023-10-22 NOTE — Consult Note (Signed)
 Consultation Note Date: 10/22/2023   Patient Name: Dana Morton  DOB: 1950-09-30  MRN: [id number]  Age / Sex: 73 y.o., female  PCP: Lynnea Ferrier, MD Referring Physician: Miguel Aschoff, MD  Reason for Consultation: Establishing goals of care  HPI/Patient Profile: 73 y.o. female  with past medical history of glioblastoma status post resection and radiation therapy, recurrent seizures, HTN, hypothyroidism, admitted on 10/20/2023 with altered mental status after a ground-level fall.   Patient currently admitted for monitoring after fall after code sepsis was called in the ED due to altered mental status and concerning UTI. The Foley was exchanged and her urinalysis from clean device is completely normal. No reversible causes of AMS have been identified and prognosis is poor with ongoing decline since glioblastoma resection in November 2024.  PMT has been consulted to assist with goals of care conversation.  Clinical Assessment and Goals of Care:  I have reviewed medical records including EPIC notes, labs and imaging, discussed with RN, assessed the patient and then met at the bedside with patient's husband, daughter, and son-in-law to discuss diagnosis prognosis, GOC, EOL wishes, disposition and options.  I introduced Palliative Medicine as specialized medical care for people living with serious illness. It focuses on providing relief from the symptoms and stress of a serious illness. The goal is to improve quality of life for both the patient and the family.  We discussed a brief life review of the patient and then focused on their current illness.   I attempted to elicit values and goals of care important to the patient.    Medical History Review and Understanding:  Reviewed patient's current hospitalizations and acute illness over the past few months.  Social History: Patient has been married for 30 years.  She has a daughter and son from a  previous marriage, 2 daughters by marriage.  She is [place] with a very strong faith.  She enjoys giving to others, telling jokes, watching [employer] movies, celebrating holidays especially Christmas.  She is very claustrophobic.  Functional and Nutritional State: Patient was able to ambulate with a walker and January, though has declined substantially since then.  Family notes this has been worsening ever since she was admitted with low platelets and not allowed to get up due to risk of bleed.  She was then limited by gout pain and uncontrolled seizures after that.  Palliative Symptoms: Confusion  Advance Directives: A detailed discussion regarding advanced directives was had.  MOST form was introduced.  Discussion: Arrived for scheduled family meeting at 10:30am, however family was not present. Rescheduled for 1 PM and returned at the bedside.  Family is having a hard time with patient's decline, as they have not yet heard of anything indicating that patient's tumor has grown back and still hope that she still has an opportunity to function better with the right therapy program. Emotional support and therapeutic listening was provided.  Patient is not able to express her own wishes at this point, though she states at different times during conversation that "it will be alright" and "it will get better."  They are all trying to support her and remain positive, while acknowledging that things may not go well at all and she may die though only God has the final say.   Explored previous GOC discussions held as a family and they have discussed her wishes for Christmas music at her funeral as well as cremation, thought not much of her wishes about her medical care  if she were to continue declining or fails to improve. An extensive conversation was had, covering concepts specific to code status, artifical feeding and hydration, continued IV antibiotics and rehospitalization.   Patient and family have  discussed that she would want a resuscitation attempt as a "final stone thrown in the pond" though she would not want CPR to go on "all day."  We discussed that this intervention often leads to additional aggressive interventions including intubation/mechanical ventilation.  Discussed the importance of considering boundaries and time-limited trials, such as how long these types of interventions should be considered and whether this would actually improve her quality of life.  Patient's family are concerned about their options if patient is unable to improve enough to return home from SNF.  Husband would be able to support her as long as she is able to get up and transfer.  He would not be able to afford long-term facility placement.  Encouraged him to consider looking into caregiver support in advance of return to SNF.  The option of hospice was also introduced and while they are not there yet, they understand rationale and appreciate the information.  Clarified that she would be able to continue her seizure medication and any other comfort medications.     The difference between aggressive medical intervention and comfort care was considered in light of the patient's goals of care. Hospice and Palliative Care services outpatient were explained and offered.   Discussed the importance of continued conversation with family and the medical providers regarding overall plan of care and treatment options, ensuring decisions are within the context of the patient's values and GOCs.   Questions and concerns were addressed.  Hard Choices booklet left for review. The family was encouraged to call with questions or concerns.  PMT will continue to support holistically.   SUMMARY OF RECOMMENDATIONS   - Continue full code/full scope treatment - Goal is for return to SNF for continued attempts at rehabilitation - MOST form was introduced today - Psychosocial and emotional support - Outpatient palliative care  referral for continued GOC discussions at SNF - PMT remains available as needed  Prognosis:  Poor  Discharge Planning: Skilled Nursing Facility for rehab with Palliative care service follow-up      Primary Diagnoses: Present on Admission:  Subacute delirium  Pressure injury of skin  Urinary retention  Declining functional status  Head contusion  Accidental fall from bed  Glioblastoma (HCC)  Hypotension due to hypovolemia   Physical Exam Vitals and nursing note reviewed.  Constitutional:      General: She is not in acute distress.    Appearance: She is ill-appearing.  Cardiovascular:     Rate and Rhythm: Normal rate.  Pulmonary:     Effort: Pulmonary effort is normal.  Neurological:     Mental Status: She is alert.  Psychiatric:        Mood and Affect: Mood normal.        Behavior: Behavior normal.     Vital Signs: BP (!) 110/57 (BP Location: Right Arm)   Pulse 93   Temp 98.3 F (36.8 C) (Oral)   Resp 18   Ht 5\' 7"  (1.702 m)   Wt 97.5 kg   SpO2 92%   BMI 33.67 kg/m  Pain Scale: 0-10   Pain Score: 0-No pain   SpO2: SpO2: 92 % O2 Device:SpO2: 92 % O2 Flow Rate: .    Palliative Assessment/Data: 30%    Total time: I spent 95 minutes  in the care of the patient today in the above activities and documenting the encounter.  MDM: high    Jory Tanguma Jeni Salles, PA-C  Palliative Medicine Team Team phone # [phone]  Thank you for allowing the Palliative Medicine Team to assist in the care of this patient. Please utilize secure chat with additional questions, if there is no response within 30 minutes please call the above phone number.  Palliative Medicine Team providers are available by phone from 7am to 7pm daily and can be reached through the team cell phone.  Should this patient require assistance outside of these hours, please call the patient's attending physician.

## 2023-10-22 NOTE — Progress Notes (Signed)
 Daughter at bedside relayed at RN that pharmacy spoke to her and was unable to assist in med list and to speak to SNF, daughter concerned about patient med decadron not being administered during this admission. MD made aware.

## 2023-10-22 NOTE — Procedures (Addendum)
 Patient Name: COPPER KIRTLEY  MRN: [id number]  Epilepsy Attending: Charlsie Quest  Referring Physician/Provider: Monna Fam, MD  Date: 10/21/2023 Duration: 22.28 mins  Patient history: 73yo F with h/o GBP s/p resection now with ams. EEG to evaluate with seizure.   Level of alertness: Awake  AEDs during EEG study: LEV, LCM  Technical aspects: This EEG study was done with scalp electrodes positioned according to the 10-20 International system of electrode placement. Electrical activity was reviewed with band pass filter of 1-70Hz , sensitivity of 7 uV/mm, display speed of 67mm/sec with a 60Hz  notched filter applied as appropriate. EEG data were recorded continuously and digitally stored.  Video monitoring was available and reviewed as appropriate.  Description: The posterior dominant rhythm consists of 8 Hz activity of moderate voltage (25-35 uV) seen predominantly in posterior head regions, symmetric and reactive to eye opening and eye closing. EEG showed continuous generalized and maximal left temporal 3 to 6 Hz theta-delta slowing. Physiologic photic driving was not seen during photic stimulation.  Hyperventilation was not performed.     ABNORMALITY - Continuous slow, generalized and maximal left temporal  IMPRESSION: This study is suggestive of cortical dysfunction arising from left temporal region, likely secondary to underlying structural abnormality. Additionally there is mild to moderate diffuse encephalopathy. No seizures or epileptiform discharges were seen throughout the recording.  Klani Caridi [name]

## 2023-10-23 DIAGNOSIS — R5381 Other malaise: Secondary | ICD-10-CM | POA: Diagnosis not present

## 2023-10-23 DIAGNOSIS — A419 Sepsis, unspecified organism: Secondary | ICD-10-CM | POA: Diagnosis not present

## 2023-10-23 DIAGNOSIS — E861 Hypovolemia: Secondary | ICD-10-CM | POA: Diagnosis not present

## 2023-10-23 DIAGNOSIS — C719 Malignant neoplasm of brain, unspecified: Secondary | ICD-10-CM | POA: Diagnosis not present

## 2023-10-23 LAB — URINE CULTURE

## 2023-10-23 MED ORDER — DEXAMETHASONE SODIUM PHOSPHATE 10 MG/ML IJ SOLN
3.0000 mg | Freq: Every day | INTRAMUSCULAR | Status: DC
  Administered 2023-10-23 – 2023-10-26 (×4): 3 mg via INTRAVENOUS
  Filled 2023-10-23 (×4): qty 1

## 2023-10-23 MED ORDER — OLANZAPINE 10 MG IM SOLR
2.5000 mg | Freq: Once | INTRAMUSCULAR | Status: AC
  Administered 2023-10-23: 2.5 mg via INTRAMUSCULAR
  Filled 2023-10-23: qty 10

## 2023-10-23 MED ORDER — BISOPROLOL FUMARATE 10 MG PO TABS
10.0000 mg | ORAL_TABLET | Freq: Every day | ORAL | Status: DC
  Administered 2023-10-23 – 2023-10-26 (×4): 10 mg via ORAL
  Filled 2023-10-23 (×5): qty 1

## 2023-10-23 MED ORDER — ENSURE ENLIVE PO LIQD
237.0000 mL | Freq: Two times a day (BID) | ORAL | Status: DC
  Administered 2023-10-23 – 2023-10-26 (×5): 237 mL via ORAL

## 2023-10-23 NOTE — TOC Progression Note (Signed)
 Transition of Care [facility]) - Progression Note    Patient Details  Name: Dana Morton MRN: [id number] Date of Birth: [birth year]  Transition of Care [facility]) CM/SW Contact  Eduard Roux, [place] Phone Number: 10/23/2023, 4:07 PM  Clinical Narrative:     Insurance auth still pending - reference # [id number]  Antony Blackbird, MSW, LCSW Clinical Social Worker    Expected Discharge Plan: Skilled Nursing Facility Barriers to Discharge: Insurance Authorization (bed confirmation from [facility])  Expected Discharge Plan and Services In-house Referral: Clinical Social Work                                             Social Determinants of Health (SDOH) Interventions SDOH Screenings   Food Insecurity: Patient Unable To Answer (10/20/2023)  Housing: Patient Unable To Answer (10/20/2023)  Transportation Needs: Patient Unable To Answer (10/20/2023)  Utilities: Patient Unable To Answer (10/20/2023)  Financial Resource Strain: Low Risk  (10/03/2023)   Received from [facility]  Social Connections: Unknown (10/20/2023)  Recent Concern: Social Connections - Moderately Isolated (08/08/2023)  Tobacco Use: Low Risk  (10/01/2023)    Readmission Risk Interventions     No data to display

## 2023-10-23 NOTE — Plan of Care (Signed)

## 2023-10-23 NOTE — Progress Notes (Signed)
  Progress Note   Date: 10/22/2023  Patient Name: Dana Morton        MRN#: [id number]  Clarification of the patient's mental status:  Delirium due to brain surgery, seizures, and medications following diagnosis of brain cancer

## 2023-10-23 NOTE — Progress Notes (Signed)
 Patient assessed at bedside due to concern from nursing staff regarding slurred speech.  In brief this is a 73 year old woman with history of glioblastoma status postresection.  Since her procedure she has had precipitous decline in her mentation and functional status.  She was recently admitted for altered mental status and a fall from bed.  On exam, she attempts to answer orientation questions but is limited by slurred speech.  Per nursing staff this is a change from prior.  She does follow verbal commands, moves all extremities spontaneously.  Eyes equal round and reactive to light.  Face symmetric.  Vital signs stable, within normal limits with exception of slightly low diastolic pressure.  On to see documented history slurred speech in the past, as well as waxing and waning of cognition.  There has been concern for seizure-like activity, EEG recently demonstrated encephalopathy and cortical dysfunction.  Unclear etiology, may represent delirium, encephalopathy, postictal state.   On repeat examination, patient speech had improved.  Per nursing, patient returned to baseline speech.

## 2023-10-23 NOTE — Evaluation (Signed)
 Occupational Therapy Evaluation Patient Details Name: Dana Morton MRN: [id number] DOB: [birth year] Today's Date: 10/23/2023   History of Present Illness   Patient is 73 y.o. female presented 10/20/23 to ED from SNF for AMS and fall out of bed. 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     Clinical Impressions Patient admitted for the diagnosis above.  PTA she was residing at a local SNF and undergoing skilled rehab.  Patient's LOA waxes and wanes at times, and inconsistent with command following, but was able to sit on the EOB with varying degrees of trunk support.  OT will follow in the acute setting, and Patient will benefit from continued inpatient follow up therapy, <3 hours/day to maximize functional status for eventual return home.  Patient does show rehab potential, goal is for her to be able to perform transfers at [facility] level to return home.       If plan is discharge home, recommend the following:   Two people to help with walking and/or transfers;A lot of help with bathing/dressing/bathroom     Functional Status Assessment   Patient has had a recent decline in their functional status and demonstrates the ability to make significant improvements in function in a reasonable and predictable amount of time.     Equipment Recommendations   None recommended by OT     Recommendations for Other Services         Precautions/Restrictions   Precautions Precautions: Fall Recall of Precautions/Restrictions: Impaired Precaution/Restrictions Comments: sacral wound stage III Restrictions Weight Bearing Restrictions Per Provider Order: No     Mobility Bed Mobility Overal bed mobility: Needs Assistance Bed Mobility: Sidelying to Sit, Sit to Supine   Sidelying to sit: Total  assist   Sit to supine: Max assist, +2 for physical assistance        Transfers                   General transfer comment: not attempted due to waxing LOA      Balance Overall balance assessment: Needs assistance Sitting-balance support: Feet supported, No upper extremity supported, Bilateral upper extremity supported, Single extremity supported Sitting balance-Leahy Scale: Poor   Postural control: Posterior lean                                 ADL either performed or assessed with clinical judgement   ADL Overall ADL's : Needs assistance/impaired Eating/Feeding: Total assistance;Bed level   Grooming: Maximal assistance;Bed level   Upper Body Bathing: Total assistance;Bed level   Lower Body Bathing: Total assistance;Bed level   Upper Body Dressing : Maximal assistance;Bed level   Lower Body Dressing: Total assistance;Bed level                       Vision   Vision Assessment?: Vision impaired- to be further tested in functional context Additional Comments: Patient did make eye contact and was able to recognize daughter.     Perception Perception: Not tested       Praxis Praxis: Not tested       Pertinent Vitals/Pain Pain Assessment Pain Assessment: Faces Faces Pain Scale: Hurts even more Pain Location: B LE's with touch Pain Descriptors / Indicators: Grimacing Pain Intervention(s): Monitored during session  Extremity/Trunk Assessment Upper Extremity Assessment Upper Extremity Assessment: Generalized weakness   Lower Extremity Assessment Lower Extremity Assessment: Defer to PT evaluation   Cervical / Trunk Assessment Cervical / Trunk Assessment: Kyphotic   Communication Communication Communication: Impaired Factors Affecting Communication: Difficulty expressing self   Cognition Arousal: Lethargic Behavior During Therapy: Flat affect Cognition: Cognition impaired   Orientation impairments: Place, Time,  Situation Awareness: Intellectual awareness impaired Memory impairment (select all impairments): Short-term memory Attention impairment (select first level of impairment): Focused attention Executive functioning impairment (select all impairments): Initiation, Reasoning, Problem solving                   Following commands: Impaired Following commands impaired: Follows one step commands inconsistently     Cueing  General Comments   Cueing Techniques: Verbal cues;Tactile cues;Visual cues      Exercises     Shoulder Instructions      Home Living Family/patient expects to be discharged to:: Skilled nursing facility                                 Additional Comments: Pt has had long course of multiple hospital admission. Pt had glioblastoma resection in Nov 2024 at [facility] and then completed chemo/rad tx in Dec 24. Pt presented to [facility] hospital in Jan 2025 for seizures and ultimately transferred to [facility] for hem/onc management. Pt found to have a bad infection, daughter reports suspected meningitis and encephalitis however, couldn't do LBP, treated anyway. Pt then developed gout flare limited by pain. Pt finally dc to SNF in March and was working on standing with lift equipment, but did not get her OOB. Pt developed sacral wound and then admitted to [facility] for wound and possible ongoing seizures. Pt then DC to [facility]. Per daughter pt was working on sitting for 10-15 minutes (unsure if unsupported) and had stood with +2 assist for ~1 min bouts. But not walking yet and the last know time of ambulating was in January 2025. Pt's daughter lives in [place] and her son is in [place]. Pt's spouse is in [place].      Prior Functioning/Environment Prior Level of Function : Needs assist  Cognitive Assist : Mobility (cognitive);ADLs (cognitive) Mobility (Cognitive): Step by step cues ADLs (Cognitive): Step by step cues Physical Assist : ADLs (physical);Mobility  (physical)     Mobility Comments: Has stood with PT with +2 assist per daughter two weeks ago.  Patient was Ind post surgery for tumor resection. ADLs Comments: Needing Min A for UB ADL, able to feed herself, Max A LB ADL 2 weeks prior.    OT Problem List: Decreased strength;Decreased range of motion;Decreased activity tolerance;Impaired balance (sitting and/or standing);Decreased cognition;Decreased safety awareness;Pain   OT Treatment/Interventions: Self-care/ADL training;Therapeutic exercise;Therapeutic activities;DME and/or AE instruction;Patient/family education;Balance training;Cognitive remediation/compensation      OT Goals(Current goals can be found in the care plan section)   Acute Rehab OT Goals Patient Stated Goal: Per daughter: be able to stand and transfer with RW OT Goal Formulation: With patient/family Time For Goal Achievement: 11/06/23 Potential to Achieve Goals: Fair ADL Goals Pt Will Perform Eating: with min assist;sitting Pt Will Perform Grooming: with min assist;sitting Additional ADL Goal #1: Patient will perform bed mobility with Mod A to increase ind with toileting Additional ADL Goal #2: Patient with follow one step commands 50% of the time with Min VC's   OT Frequency:  Min 2X/week  Co-evaluation              AM-PAC OT "6 Clicks" Daily Activity     Outcome Measure Help from another person eating meals?: Total Help from another person taking care of personal grooming?: A Lot Help from another person toileting, which includes using toliet, bedpan, or urinal?: Total Help from another person bathing (including washing, rinsing, drying)?: A Lot Help from another person to put on and taking off regular upper body clothing?: A Lot Help from another person to put on and taking off regular lower body clothing?: Total 6 Click Score: 9   End of Session Nurse Communication: Mobility status  Activity Tolerance: Patient limited by lethargy Patient left:  in bed;with call bell/phone within reach;with family/visitor present  OT Visit Diagnosis: Muscle weakness (generalized) (M62.81);Other symptoms and signs involving cognitive function                Time: 1434-1457 OT Time Calculation (min): 23 min Charges:  OT General Charges $OT Visit: 1 Visit OT Evaluation $OT Eval Moderate Complexity: 1 Mod OT Treatments $Therapeutic Activity: 8-22 mins  10/23/2023  RP, OTR/L  Acute Rehabilitation Services  Office:  [phone]   Suzanna Obey 10/23/2023, 3:04 PM

## 2023-10-23 NOTE — Discharge Instructions (Addendum)
 Ms Dana Morton, Dana Morton were hospitalized after a fall. At this time I feel comfortable discharging you back to your nursing facility. Thank you for allowing us  to be part of your care.   Please schedule with your PCP within the next 1-2 weeks  Please note these changes made to your medications:   *Please STOP taking:  Lisinopril until restarted by your PCP Prednisone  Please make sure to return to the hospital if you have worsening confusion, pain, or another fall.   Thanks,  Dr Esaw Heckler

## 2023-10-23 NOTE — Evaluation (Signed)
 Physical Therapy Evaluation Patient Details Name: Dana Morton MRN: [id number] DOB: 04-18-1951 Today's Date: 10/23/2023  History of Present Illness  Patient is 73 y.o. female presented 10/20/23 to ED from SNF for AMS and fall out of bed. 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   Clinical Impression  Dana Morton is 73 y.o. female admitted with above HPI and diagnosis. Patient is currently limited by functional impairments below (see PT problem list). Patient has had decline in mobility and function since January of 2025 and recently has been limited to sitting EOB and sit<>stand transfers with +2 assist at [facility]. Currently pt requires Total assist for all aspects of bed mobility and pt unable to tolerate sitting EOB for <1 minute. Patient will benefit from continued skilled PT interventions to address impairments and progress independence with mobility. Acute PT will follow and progress as able.         If plan is discharge home, recommend the following: Two people to help with walking and/or transfers;Two people to help with bathing/dressing/bathroom;Assistance with cooking/housework;Assistance with feeding;Direct supervision/assist for medications management;Direct supervision/assist for financial management;Assist for transportation;Help with stairs or ramp for entrance;Supervision due to cognitive status   Can travel by private vehicle   No    Equipment Recommendations Hoyer lift;Hospital bed  Recommendations for Other Services       Functional Status Assessment Patient has had a recent decline in their functional status and/or demonstrates limited ability to make significant improvements in function in a reasonable and predictable amount of time     Precautions / Restrictions  Precautions Precautions: Fall Recall of Precautions/Restrictions: Impaired Precaution/Restrictions Comments: sacral wound stage III Restrictions Weight Bearing Restrictions Per Provider Order: No      Mobility  Bed Mobility Overal bed mobility: Needs Assistance Bed Mobility: Rolling, Sidelying to Sit, Sit to Supine Rolling: Total assist, +2 for safety/equipment, +2 for physical assistance, Used rails Sidelying to sit: Total assist, +2 for physical assistance, +2 for safety/equipment, HOB elevated, Used rails   Sit to supine: Total assist, +2 for physical assistance, +2 for safety/equipment   General bed mobility comments: Total Assist for all aspects    Transfers                   General transfer comment: unsafe at this time, maximove transfers    Ambulation/Gait                  Stairs            Wheelchair Mobility     Tilt Bed    Modified Rankin (Stroke Patients Only)       Balance                                             Pertinent Vitals/Pain Pain Assessment Pain Assessment: PAINAD Breathing: normal Negative Vocalization: none Facial Expression: sad, frightened, frown Body Language: relaxed Consolability: distracted or reassured by voice/touch PAINAD Score: 2 Pain Intervention(s): Monitored during session, Repositioned    Home Living Family/patient expects to be discharged to:: Skilled nursing facility Living Arrangements: Spouse/significant other Available Help at Discharge: Family;Available 24 hours/day Type of Home: House Home Access: Stairs to enter Entrance Stairs-Rails: Doctor, general practice of  Steps: 3   Home Layout: One level Home Equipment: Rolling Walker (2 wheels);Rollator (4 wheels);Cane - single point;BSC/3in1;Shower seat;Wheelchair - manual;Cane - quad;Grab bars - toilet;Grab bars - tub/shower;Hand held shower head;Lift chair Additional Comments: Pt has had long course of  multiple hospital admission. Pt had glioblastoma resection in Nov 2024 at [facility] and then completed chemo/rad tx in Dec 24. Pt presented to [facility] hospital in Jan 2025 for seizures and ultimately transferred to [facility] for hem/onc management. Pt found to have a bad infection, daughter reports suspected meningitis and encephalitis however, couldn't do LBP, treated anyway. Pt then developed gout flare limited by pain. Pt finally dc to SNF in March and was working on standing with lift equipment, but did not get her OOB. Pt developed sacral wound and then admitted to [facility] for wound and possible ongoing seizures. Pt then DC to [facility]. Per daughter pt was working on sitting for 10-15 minutes (unsure if unsupported) and had stood with +2 assist for ~1 min bouts. But not walking yet and the last know time of ambulating was in January 2025. Pt's daughter lives in [place] and her son is in [place]. Pt's spouse is in [place].    Prior Function Prior Level of Function : Needs assist  Cognitive Assist : Mobility (cognitive);ADLs (cognitive)                   Extremity/Trunk Assessment   Upper Extremity Assessment Upper Extremity Assessment: Generalized weakness    Lower Extremity Assessment Lower Extremity Assessment: Generalized weakness    Cervical / Trunk Assessment Cervical / Trunk Assessment: Kyphotic;Other exceptions Cervical / Trunk Exceptions: no trunk control  Communication   Communication Communication: Impaired    Cognition Arousal: Alert, Lethargic Behavior During Therapy: Flat affect   PT - Cognitive impairments: Orientation, Awareness, Memory, Attention, Initiation, Sequencing, Safety/Judgement, Problem solving   Orientation impairments: Place, Time, Situation                   PT - Cognition Comments: states first name, unable to state last name Following commands: Intact       Cueing Cueing Techniques: Verbal cues, Tactile cues, Visual cues      General Comments      Exercises     Assessment/Plan    PT Assessment Patient needs continued PT services  PT Problem List Decreased balance;Decreased activity tolerance;Decreased range of motion;Decreased strength;Decreased mobility;Decreased cognition;Decreased coordination;Decreased safety awareness;Decreased knowledge of use of DME;Decreased knowledge of precautions;Obesity;Decreased skin integrity       PT Treatment Interventions DME instruction;Gait training;Stair training;Functional mobility training;Therapeutic activities;Therapeutic exercise;Balance training;Neuromuscular re-education;Cognitive remediation;Patient/family education;Wheelchair mobility training;Manual techniques    PT Goals (Current goals can be found in the Care Plan section)  Acute Rehab PT Goals Patient Stated Goal: family hopeful for some cognitive and mobility recovery PT Goal Formulation: With family Time For Goal Achievement: 11/06/23 Potential to Achieve Goals: Fair    Frequency Min 1X/week     Co-evaluation               AM-PAC PT "6 Clicks" Mobility  Outcome Measure Help needed turning from your back to your side while in a flat bed without using bedrails?: Total Help needed moving from lying on your back to sitting on the side of a flat bed without using bedrails?: Total Help needed moving to and from a bed to a chair (including a wheelchair)?: Total Help needed standing up from a chair using your arms (e.g., wheelchair or  bedside chair)?: Total Help needed to walk in hospital room?: Total Help needed climbing 3-5 steps with a railing? : Total 6 Click Score: 6    End of Session   Activity Tolerance: Patient limited by pain;Patient limited by fatigue Patient left: in bed;with call bell/phone within reach;with bed alarm set;with family/visitor present (chair position) Nurse Communication: Mobility status;Need for lift equipment PT Visit Diagnosis: Muscle weakness (generalized)  (M62.81);Difficulty in walking, not elsewhere classified (R26.2);Other symptoms and signs involving the nervous system (R29.898)    Time: 0981-1914 PT Time Calculation (min) (ACUTE ONLY): 52 min   Charges:   PT Evaluation $PT Eval Moderate Complexity: 1 Mod PT Treatments $Therapeutic Activity: 8-22 mins PT General Charges $$ ACUTE PT VISIT: 1 Visit         Wynn Maudlin, DPT Acute Rehabilitation Services Office [phone]  10/23/23 12:04 PM

## 2023-10-23 NOTE — Discharge Summary (Deleted)
 Name: Dana Morton MRN: [id number] DOB: [birth year] 73 y.o. PCP: Lynnea Ferrier, MD  Date of Admission: 10/20/2023  9:46 AM Date of Discharge: 10/23/23 Attending Physician: Dr. Mayford Knife  Discharge Diagnosis: Principal Problem:   Head contusion Active Problems:   Glioblastoma (HCC)   Seizure disorder (HCC)   Subacute delirium   Pressure injury of skin   Urinary retention   Declining functional status   Accidental fall from bed   Hypotension due to hypovolemia    Discharge Medications: Allergies as of 10/23/2023       Reactions   Celebrex [celecoxib] Nausea Only   Codeine Nausea Only        Medication List     PAUSE taking these medications    lisinopril 40 MG tablet Wait to take this until your doctor or other care provider tells you to start again. Commonly known as: ZESTRIL Take 40 mg by mouth at bedtime.   predniSONE 20 MG tablet Wait to take this until your doctor or other care provider tells you to start again. Commonly known as: DELTASONE Take 20 mg by mouth daily with breakfast.       TAKE these medications    bisoprolol 10 MG tablet Commonly known as: ZEBETA Take 10 mg by mouth daily.   CALCIUM 600+D PO Take 1 tablet by mouth daily.   cetirizine 10 MG tablet Commonly known as: ZYRTEC Take 10 mg by mouth daily.   Cholecalciferol 25 MCG (1000 UT) tablet Take 1,000 Units by mouth daily.   cloBAZam 10 MG tablet Commonly known as: ONFI Take 10 mg by mouth 2 (two) times daily.   cyanocobalamin 1000 MCG tablet Take 1 tablet (1,000 mcg total) by mouth daily.   lacosamide 200 MG Tabs tablet Commonly known as: VIMPAT Take 1 tablet (200 mg total) by mouth 2 (two) times daily.   levETIRAcetam 750 MG tablet Commonly known as: KEPPRA Take 2 tablets (1,500 mg total) by mouth 2 (two) times daily.   levothyroxine 137 MCG tablet Commonly known as: SYNTHROID Take 137 mcg by mouth daily before breakfast.   LORazepam 1 MG tablet Commonly  known as: ATIVAN Take 1 mg by mouth daily as needed for anxiety.   lovastatin 40 MG tablet Commonly known as: MEVACOR Take 40 mg by mouth at bedtime.   mirtazapine 7.5 MG tablet Commonly known as: REMERON Take 7.5 mg by mouth at bedtime.   multivitamin with minerals Tabs tablet Take 1 tablet by mouth daily. Centrum Silver for Women   omeprazole 20 MG capsule Commonly known as: PRILOSEC Take 20 mg by mouth daily before breakfast.       Disposition and follow-up:   Dana Morton was discharged from [facility] in Stable condition.  At the hospital follow up visit please address:  1.  Follow-up:   Decreased cognitive status s/p glioblastoma resection: Patient had been receiving dexamethasone prior to admission. This was not administered during this hospital stay but can be restarted on discharge per PCP. Per chart review, this had been prescribed by [facility] after her resection.    Functional incontinence: foley catheter was left in at time of hospital discharge to assist in healing of sacral wound. Voiding trial had been planned but patient was admitted during this time   HTN: Patient's bisoprolol was continued but lisinopril was held due to low/normal blood pressures. PCP may restart lisinopril as indicated   Hypothyroidism: TSH was elevated to 7.8, adjust synthroid as necessary  2.  Labs / imaging needed at time of follow-up: None  3.  Pending labs/ test needing follow-up: None  4.  Medication Changes  Started:  Stopped: lisinopril  Changed:  Abx -   End Date:  Follow-up Appointments: Per PCP  Hospital Course by problem list:   Altered mental status Hx glioblastoma s/p resection and radiation with residual deficits Patient was brought to the ED from her nursing facility due to altered mental status after a fall from her bed. On exam, patient was intermittently alert and oriented with waxing/waning cognition, no other neurological deficits. CT  imaging of the head and spine were unrevealing for acute intracranial or spinal abnormalities with no evidence of bleeding. Lactate was initially elevated with improvement after IV fluid resuscitation. Labs were otherwise reassuring.  With more collateral information from family, patient appeared to be at her baseline mental status. She has had significant cognitive deficits and recurrent seizures since she had a glioblastoma resected in November 2024. She has had multiple hospital admissions since then and little significant improvement despite a high level of medical care. Palliative team was consulted, who helped determine patient will continue full code/full scope of treatment with continued GOC discussions once patient is discharged to her SNF. Patient was discharged to her SNF in stable condition.   Functional incontinence Patient already had a foley catheter at the time of admission due to functional incontinence and to help with healing of a sacral wound. The foley was replaced in order to get a better urine sample and left in at the time of discharge.   Seizure disorder There was concern for either status epilepticus or a prolonged postictal state at the time of admission. EEG was suggestive of cortical dysfunction likely secondary to structural abnormality as expected following surgery. No seizures or epileptiform changes seen. Patient's home Keppra, lacosamide, and clobazam were continued throughout the admission.   Stage 1 bilateral heel ulcers: heel elevation to prevent progression of ulcers  Stage 3 sacral ulcer: bariatric bed, wound care per WOC. Foley catheter was left in at time of discharge to assist in wound healing  Elevated lactate: lactate was elevated at time of admission, likely due to dehydration. Lactate resolved to normal levels after IV fluid resuscitation  Hypertension: BP remained low/normal throughout admission, and home antihypertensives were not administered.    Hypothyroidism: Home dose levothyroxine was continued during admission   Discharge Subjective: Patient is feeling well with no new complaints today. I spoke with her daughter at bedside about goals of care and the meeting with the palliative team yesterday. She still plans to pursue full scope of care and hopes for recovery with further treatment from her skilled nursing facility  Discharge Exam:   BP (!) 120/57 (BP Location: Right Arm)   Pulse 96   Temp 98.1 F (36.7 C) (Oral)   Resp 18   Ht 5\' 7"  (1.702 m)   Wt 97.5 kg   SpO2 98%   BMI 33.67 kg/m  Constitutional: chronically ill appearing, no acute distress. Pleasantly confused Neuro: A&O x 1  Pertinent Labs, Studies, and Procedures:     Latest Ref Rng & Units 10/21/2023    5:25 AM 10/20/2023   10:24 AM 10/01/2023   12:25 PM  CBC  WBC 4.0 - 10.5 K/uL 4.0  5.4  6.9   Hemoglobin 12.0 - 15.0 g/dL 9.7  36.6  7.9   Hematocrit 36.0 - 46.0 % 29.3  31.4  24.5   Platelets 150 - 400  K/uL 32  37  58        Latest Ref Rng & Units 10/21/2023    5:25 AM 10/20/2023   10:24 AM 10/01/2023   12:25 PM  CMP  Glucose 70 - 99 mg/dL 98  409  811   BUN 8 - 23 mg/dL 21  33  60   Creatinine 0.44 - 1.00 mg/dL 9.14  7.82  9.56   Sodium 135 - 145 mmol/L 140  137  140   Potassium 3.5 - 5.1 mmol/L 3.5  4.2  3.8   Chloride 98 - 111 mmol/L 104  102  103   CO2 22 - 32 mmol/L 24  24  24    Calcium 8.9 - 10.3 mg/dL 9.1  9.1  9.0   Total Protein 6.5 - 8.1 g/dL  7.0  7.7   Total Bilirubin 0.0 - 1.2 mg/dL  0.5  0.7   Alkaline Phos 38 - 126 U/L  116  90   AST 15 - 41 U/L  59  35   ALT 0 - 44 U/L  78  30     CT HEAD WO CONTRAST ( ) Result Date: 10/20/2023 CLINICAL DATA:  Provided history: Facial trauma, blunt. Additional history provided: Unwitnessed fall (striking left side of head above eye). EXAM: CT HEAD WITHOUT CONTRAST CT MAXILLOFACIAL WITHOUT CONTRAST CT CERVICAL SPINE WITHOUT CONTRAST TECHNIQUE: Multidetector CT imaging of the head, cervical spine,  and maxillofacial structures were performed using the standard protocol without intravenous contrast. Multiplanar CT image reconstructions of the cervical spine and maxillofacial structures were also generated. RADIATION DOSE REDUCTION: This exam was performed according to the departmental dose-optimization program which includes automated exposure control, adjustment of the mA and/or kV according to patient size and/or use of iterative reconstruction technique. COMPARISON:  Head CT 10/01/2023.  Brain MRI 08/05/2023. FINDINGS: CT HEAD FINDINGS Brain: Generalized cerebral atrophy. Resection cavity within the left temporal lobe, unchanged in appearance as compared to the recent prior head CT of 10/01/2023. 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 Mild patchy and ill-defined hypoattenuation elsewhere within the cerebral white matter, nonspecific but compatible with chronic small ischemic disease. There is no acute intracranial hemorrhage. No demarcated cortical infarct. No extra-axial fluid collection. No midline shift. Vascular: No hyperdense vessel.  Atherosclerotic calcifications. Skull: No evidence of an acute calvarial fracture. Left temporoparietal cranioplasty. CT MAXILLOFACIAL FINDINGS Osseous: No evidence of an acute maxillofacial fracture. Orbits: Left anterior scalp/periorbital hematoma. No acute finding within the orbits. Sinuses: Small volume fluid within the right sphenoid sinus. Soft tissues: Left anterior scalp/periorbital hematoma. CT CERVICAL SPINE FINDINGS Alignment: Levocurvature of the cervical spine. Nonspecific reversal of the expected cervical lordosis. 2 mm C2-C3 grade 1 anterolisthesis. Skull base and vertebrae: The basion-dental and atlanto-dental intervals are maintained.No evidence of acute fracture to the cervical spine. Soft tissues and spinal canal: No prevertebral fluid or swelling. No visible canal hematoma. Disc levels: Cervical spondylosis with  multilevel disc space narrowing, disc bulges/central disc protrusions, posterior disc osteophyte complexes, uncovertebral hypertrophy and facet arthropathy. Disc space narrowing is greatest at C4-C5, C5-C6 and C6-C7 (moderate at these levels). Multilevel spinal canal stenosis. Most notably, posterior disc osteophyte complexes contribute to at least moderate spinal canal stenosis at C4-C5 and C5-C6. Multilevel bony neural foraminal narrowing. Multilevel ventral osteophytes. Degenerative changes also present at the C1-C2 articulation. Upper chest: No consolidation within the imaged lung apices. No visible pneumothorax. IMPRESSION: CT head: 1.  No evidence of an acute intracranial abnormality. 2. Postoperative  changes within the left temporal lobe. 3. Parenchymal atrophy and chronic small vessel ischemic disease. CT maxillofacial: 1. No evidence of an acute maxillofacial fracture. 2. Left anterior scalp/periorbital hematoma. 3. Mild right sphenoid sinusitis. CT cervical spine: 1. No evidence of an acute cervical spine fracture. 2. Nonspecific reversal of the expected cervical lordosis. 3. Levocurvature of the cervical spine. 4. Mild C2-C3 grade 1 anterolisthesis. 5. Cervical spondylosis as outlined within the body of the report. Spinal canal stenosis is greatest at C4-C5 and C5-C6 (at least moderate at these levels). Multilevel bony neural foraminal narrowing. Electronically Signed   By: Jackey Loge D.O.   On: 10/20/2023 14:55   CT Cervical Spine Wo Contrast Result Date: 10/20/2023 CLINICAL DATA:  Provided history: Facial trauma, blunt. Additional history provided: Unwitnessed fall (striking left side of head above eye). EXAM: CT HEAD WITHOUT CONTRAST CT MAXILLOFACIAL WITHOUT CONTRAST CT CERVICAL SPINE WITHOUT CONTRAST TECHNIQUE: Multidetector CT imaging of the head, cervical spine, and maxillofacial structures were performed using the standard protocol without intravenous contrast. Multiplanar CT image  reconstructions of the cervical spine and maxillofacial structures were also generated. RADIATION DOSE REDUCTION: This exam was performed according to the departmental dose-optimization program which includes automated exposure control, adjustment of the mA and/or kV according to patient size and/or use of iterative reconstruction technique. COMPARISON:  Head CT 10/01/2023.  Brain MRI 08/05/2023. FINDINGS: CT HEAD FINDINGS Brain: Generalized cerebral atrophy. Resection cavity within the left temporal lobe, unchanged in appearance as compared to the recent prior head CT of 10/01/2023. 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 Mild patchy and ill-defined hypoattenuation elsewhere within the cerebral white matter, nonspecific but compatible with chronic small ischemic disease. There is no acute intracranial hemorrhage. No demarcated cortical infarct. No extra-axial fluid collection. No midline shift. Vascular: No hyperdense vessel.  Atherosclerotic calcifications. Skull: No evidence of an acute calvarial fracture. Left temporoparietal cranioplasty. CT MAXILLOFACIAL FINDINGS Osseous: No evidence of an acute maxillofacial fracture. Orbits: Left anterior scalp/periorbital hematoma. No acute finding within the orbits. Sinuses: Small volume fluid within the right sphenoid sinus. Soft tissues: Left anterior scalp/periorbital hematoma. CT CERVICAL SPINE FINDINGS Alignment: Levocurvature of the cervical spine. Nonspecific reversal of the expected cervical lordosis. 2 mm C2-C3 grade 1 anterolisthesis. Skull base and vertebrae: The basion-dental and atlanto-dental intervals are maintained.No evidence of acute fracture to the cervical spine. Soft tissues and spinal canal: No prevertebral fluid or swelling. No visible canal hematoma. Disc levels: Cervical spondylosis with multilevel disc space narrowing, disc bulges/central disc protrusions, posterior disc osteophyte complexes, uncovertebral  hypertrophy and facet arthropathy. Disc space narrowing is greatest at C4-C5, C5-C6 and C6-C7 (moderate at these levels). Multilevel spinal canal stenosis. Most notably, posterior disc osteophyte complexes contribute to at least moderate spinal canal stenosis at C4-C5 and C5-C6. Multilevel bony neural foraminal narrowing. Multilevel ventral osteophytes. Degenerative changes also present at the C1-C2 articulation. Upper chest: No consolidation within the imaged lung apices. No visible pneumothorax. IMPRESSION: CT head: 1.  No evidence of an acute intracranial abnormality. 2. Postoperative changes within the left temporal lobe. 3. Parenchymal atrophy and chronic small vessel ischemic disease. CT maxillofacial: 1. No evidence of an acute maxillofacial fracture. 2. Left anterior scalp/periorbital hematoma. 3. Mild right sphenoid sinusitis. CT cervical spine: 1. No evidence of an acute cervical spine fracture. 2. Nonspecific reversal of the expected cervical lordosis. 3. Levocurvature of the cervical spine. 4. Mild C2-C3 grade 1 anterolisthesis. 5. Cervical spondylosis as outlined within the body of the report. Spinal  canal stenosis is greatest at C4-C5 and C5-C6 (at least moderate at these levels). Multilevel bony neural foraminal narrowing. Electronically Signed   By: Jackey Loge D.O.   On: 10/20/2023 14:55   CT Maxillofacial Wo Contrast Result Date: 10/20/2023 CLINICAL DATA:  Provided history: Facial trauma, blunt. Additional history provided: Unwitnessed fall (striking left side of head above eye). EXAM: CT HEAD WITHOUT CONTRAST CT MAXILLOFACIAL WITHOUT CONTRAST CT CERVICAL SPINE WITHOUT CONTRAST TECHNIQUE: Multidetector CT imaging of the head, cervical spine, and maxillofacial structures were performed using the standard protocol without intravenous contrast. Multiplanar CT image reconstructions of the cervical spine and maxillofacial structures were also generated. RADIATION DOSE REDUCTION: This exam was  performed according to the departmental dose-optimization program which includes automated exposure control, adjustment of the mA and/or kV according to patient size and/or use of iterative reconstruction technique. COMPARISON:  Head CT 10/01/2023.  Brain MRI 08/05/2023. FINDINGS: CT HEAD FINDINGS Brain: Generalized cerebral atrophy. Resection cavity within the left temporal lobe, unchanged in appearance as compared to the recent prior head CT of 10/01/2023. 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 Mild patchy and ill-defined hypoattenuation elsewhere within the cerebral white matter, nonspecific but compatible with chronic small ischemic disease. There is no acute intracranial hemorrhage. No demarcated cortical infarct. No extra-axial fluid collection. No midline shift. Vascular: No hyperdense vessel.  Atherosclerotic calcifications. Skull: No evidence of an acute calvarial fracture. Left temporoparietal cranioplasty. CT MAXILLOFACIAL FINDINGS Osseous: No evidence of an acute maxillofacial fracture. Orbits: Left anterior scalp/periorbital hematoma. No acute finding within the orbits. Sinuses: Small volume fluid within the right sphenoid sinus. Soft tissues: Left anterior scalp/periorbital hematoma. CT CERVICAL SPINE FINDINGS Alignment: Levocurvature of the cervical spine. Nonspecific reversal of the expected cervical lordosis. 2 mm C2-C3 grade 1 anterolisthesis. Skull base and vertebrae: The basion-dental and atlanto-dental intervals are maintained.No evidence of acute fracture to the cervical spine. Soft tissues and spinal canal: No prevertebral fluid or swelling. No visible canal hematoma. Disc levels: Cervical spondylosis with multilevel disc space narrowing, disc bulges/central disc protrusions, posterior disc osteophyte complexes, uncovertebral hypertrophy and facet arthropathy. Disc space narrowing is greatest at C4-C5, C5-C6 and C6-C7 (moderate at these levels). Multilevel  spinal canal stenosis. Most notably, posterior disc osteophyte complexes contribute to at least moderate spinal canal stenosis at C4-C5 and C5-C6. Multilevel bony neural foraminal narrowing. Multilevel ventral osteophytes. Degenerative changes also present at the C1-C2 articulation. Upper chest: No consolidation within the imaged lung apices. No visible pneumothorax. IMPRESSION: CT head: 1.  No evidence of an acute intracranial abnormality. 2. Postoperative changes within the left temporal lobe. 3. Parenchymal atrophy and chronic small vessel ischemic disease. CT maxillofacial: 1. No evidence of an acute maxillofacial fracture. 2. Left anterior scalp/periorbital hematoma. 3. Mild right sphenoid sinusitis. CT cervical spine: 1. No evidence of an acute cervical spine fracture. 2. Nonspecific reversal of the expected cervical lordosis. 3. Levocurvature of the cervical spine. 4. Mild C2-C3 grade 1 anterolisthesis. 5. Cervical spondylosis as outlined within the body of the report. Spinal canal stenosis is greatest at C4-C5 and C5-C6 (at least moderate at these levels). Multilevel bony neural foraminal narrowing. Electronically Signed   By: Jackey Loge D.O.   On: 10/20/2023 14:55   DG Chest Port 1 View Result Date: 10/20/2023 CLINICAL DATA:  Questionable sepsis. EXAM: PORTABLE CHEST 1 VIEW COMPARISON:  10/01/2023 FINDINGS: Artifact from EKG leads. Low volume chest. There is no edema, consolidation, effusion, or pneumothorax. Generous heart size accentuated by technique and stable.  IMPRESSION: Stable low volume chest.  No acute finding. Electronically Signed   By: Tiburcio Pea M.D.   On: 10/20/2023 11:28    Signed: Monna Fam, MD PGY-1 10/23/2023, 12:43 PM   Pager: [phone]

## 2023-10-23 NOTE — Progress Notes (Signed)
 [facility] Liaison Note:  This patient is currently enrolled in [facility] outpatient-based palliative care.   Please call for any outpatient based palliative care related questions or concerns.  Thank you, Glenna Fellows, BSN, RN, OCN [facility] Liaison [phone]

## 2023-10-23 NOTE — Progress Notes (Signed)
                  Subjective:   Summary: Patient is a 73 year old woman with a history of glioblastoma status postresection and radiation therapy, recurrent seizures, multiple recent hospital admissions, brought in by EMS from her nursing facility for altered mental status after a fall from bed.  Patient is feeling well with no new complaints today. I spoke with her daughter at bedside about goals of care and the meeting with the palliative team yesterday. She still plans to pursue full scope of care and hopes for recovery with further treatment from her skilled nursing facility   Objective:  Vital signs in last 24 hours: Vitals:   10/22/23 2310 10/23/23 0324 10/23/23 0751 10/23/23 1200  BP: (!) 120/52 (!) 126/50 (!) 120/57 (!) 129/51  Pulse: 94 90 96 77  Resp: 20  18 19   Temp: 97.7 F (36.5 C) 98 F (36.7 C) 98.1 F (36.7 C) 98.4 F (36.9 C)  TempSrc: Oral Oral Oral Oral  SpO2: 96% 97% 98% 98%  Weight:      Height:       Supplemental O2: Room Air SpO2: 98 %  Physical Exam:  Constitutional: Chronically ill-appearing. No acute distress Neuro: A&O x 1  Assessment/Plan:   Altered mental status Ground level fall Hx glioblastoma s/p resection and radiation with residual deficits Patient appears to be at her baseline cognitive status. Per family meeting yesterday, family still plans to pursue full scope of care and hopes for improvement after care at a skilled nursing facility. Patient is medically ready for discharge as soon as she has insurance authorization to return to her SNF.   Functional incontinence Continuing foley catheter at this time, may plan for a voiding trial once disposition becomes more clear (though if foley is being maintained to help with healing of her sacral wound, that would be a different scenario).  Seizures EEG suggestive of cortical dysfunction likely secondary to structural abnormality as expected following surgery. No seizures or epileptiform  changes seen.  -Continue home Keppra 1500mg  BID, lacosamide 200mg  daily, clobazam 10mg  BID  Stage 1 bilateral heel ulcers Physical exam shows nonblanching erythema of the bilateral posterior heels. - Heel elevation  Sacral wound stage 3 Continue bariatric bed, foley catheter to avoid wound contamination with patient's history of urinary incontinence  Elevated lactate, resolved: no further IV fluids indicated  Hypertension not requiring treatment at this time: Holding home antihypertensives Hypothyroidism: Continue home levothyroxine  Diet: Dys 3 VTE: SCD Code: Full  Dispo: Anticipated discharge to Skilled nursing facility pending authorization  Monna Fam, MD PGY-1 Internal Medicine Resident Pager Number [phone] Please contact the on call pager after 5 pm and on weekends at [phone].

## 2023-10-24 LAB — CBC
HCT: 29.2 % — ABNORMAL LOW (ref 36.0–46.0)
Hemoglobin: 9.9 g/dL — ABNORMAL LOW (ref 12.0–15.0)
MCH: 34 pg (ref 26.0–34.0)
MCHC: 33.9 g/dL (ref 30.0–36.0)
MCV: 100.3 fL — ABNORMAL HIGH (ref 80.0–100.0)
Platelets: 30 10*3/uL — ABNORMAL LOW (ref 150–400)
RBC: 2.91 MIL/uL — ABNORMAL LOW (ref 3.87–5.11)
RDW: 20.9 % — ABNORMAL HIGH (ref 11.5–15.5)
WBC: 4 10*3/uL (ref 4.0–10.5)
nRBC: 0 % (ref 0.0–0.2)

## 2023-10-24 MED ORDER — RIVAROXABAN 10 MG PO TABS
10.0000 mg | ORAL_TABLET | Freq: Every day | ORAL | Status: DC
  Administered 2023-10-24: 10 mg via ORAL
  Filled 2023-10-24 (×2): qty 1

## 2023-10-24 MED ORDER — SENNOSIDES-DOCUSATE SODIUM 8.6-50 MG PO TABS
1.0000 | ORAL_TABLET | Freq: Every day | ORAL | Status: DC
  Administered 2023-10-24 – 2023-10-25 (×2): 1 via ORAL
  Filled 2023-10-24 (×2): qty 1

## 2023-10-24 MED ORDER — POLYETHYLENE GLYCOL 3350 17 G PO PACK
17.0000 g | PACK | Freq: Every day | ORAL | Status: DC
  Administered 2023-10-25 – 2023-10-26 (×2): 17 g via ORAL
  Filled 2023-10-24 (×2): qty 1

## 2023-10-24 MED ORDER — BISACODYL 10 MG RE SUPP
10.0000 mg | Freq: Once | RECTAL | Status: AC
  Administered 2023-10-24: 10 mg via RECTAL
  Filled 2023-10-24: qty 1

## 2023-10-24 NOTE — Progress Notes (Signed)
                  Subjective:   Summary: Patient is a 73 year old woman with a history of glioblastoma status postresection and radiation therapy, recurrent seizures, multiple recent hospital admissions, brought in by EMS from her nursing facility for altered mental status after a fall from bed.  Patient is doing well this morning and appears at her baseline.  She does not report any pain and does not have any new or worsening complaints.   Objective:  Vital signs in last 24 hours: Vitals:   10/23/23 1629 10/23/23 1941 10/23/23 2336 10/24/23 0307  BP: (!) 121/52 (!) 101/44 (!) 107/51 (!) 109/51  Pulse: 79 70 61 (!) 53  Resp: 17 18 18 16   Temp: 99.1 F (37.3 C) 98 F (36.7 C) 97.8 F (36.6 C) 97.6 F (36.4 C)  TempSrc: Oral Axillary Axillary Axillary  SpO2: 90% 96% 98% 98%  Weight:      Height:       Supplemental O2: Room Air SpO2: 98 %  Physical Exam:  Constitutional: Chronically ill-appearing. No acute distress Neuro: A&O x 1  Assessment/Plan:   Altered mental status Ground level fall Hx glioblastoma s/p resection and radiation with residual deficits Remains at her baseline mental status which is alert to person only and intermittently worse.  We will continue with plan to return to previous SNF, [facility], pending insurance authorization.    Functional incontinence Continuing foley catheter at this time, defer voiding trial to outpatient physician but we do believe that continued catheter placement will benefit in the healing of her sacral wound.  Seizures EEG suggestive of cortical dysfunction likely secondary to structural abnormality as expected following surgery. No seizures or epileptiform changes seen.  -Continue home Keppra 1500mg  BID, lacosamide 200mg  daily, clobazam 10mg  BID  Stage 1 bilateral heel ulcers Physical exam shows nonblanching erythema of the bilateral posterior heels. - Heel elevation  Sacral wound stage 3 Continue bariatric bed, foley  catheter to avoid wound contamination with patient's history of urinary incontinence  Elevated lactate, resolved: no further IV fluids indicated  Hypertension not requiring treatment at this time: Holding home antihypertensives Hypothyroidism: Continue home levothyroxine 150mcg  Diet: Dys 3 VTE: SCD Code: Full  Dispo: Anticipated discharge to Skilled nursing facility pending insurance authorization.  Patient is medically stable for transfer to SNF.  Cleven Dallas, DO Internal Medicine Resident, PGY-2 Please contact the on call pager at [phone] for any urgent or emergent needs. 6:22 AM 10/24/2023

## 2023-10-24 NOTE — Plan of Care (Signed)

## 2023-10-24 NOTE — Progress Notes (Signed)
 Alerted by nursing of stool impaction with initially small amount of bright red blood with disimpaction.  After tapwater enema there was more red blood.  I went to evaluate the patient who was tired but did not appear in any acute distress.  Vital signs were reassuring with no tachycardia, hypotension, or hypoxia.  Physical exam showed warm and well-perfused extremities and there was some blood mixed with brown stool on rectal exam but no active oozing on my exam.  She was placed back on telemetry and vital signs remained stable, repeat CBC showed stable hemoglobin at 9.9.  Most likely cause of her bleeding was significant irritation due to her impacted stool and I do not expect an active brisk GI bleed at this point but will keep her on telemetry and monitor for any signs of continued or worsening bleeding.  Cleven Dallas, DO Internal Medicine Resident, PGY-2 Please contact the on call pager at [phone] for any urgent or emergent needs. 4:35 PM 10/24/2023

## 2023-10-24 NOTE — Progress Notes (Signed)
   Progress Note   Date: 10/22/2023  Patient Name: Dana Morton        MRN#: [id number]   Clarification of the diagnosis of pressure ulcer(s):   Pressure injury stage 3 sacral, present on admission (at the time of the admission order)  Pressure injury unstageable, medial buttocks, present on admission (at the time of the admission order)

## 2023-10-25 DIAGNOSIS — E861 Hypovolemia: Secondary | ICD-10-CM | POA: Diagnosis not present

## 2023-10-25 DIAGNOSIS — R5381 Other malaise: Secondary | ICD-10-CM | POA: Diagnosis not present

## 2023-10-25 DIAGNOSIS — C719 Malignant neoplasm of brain, unspecified: Secondary | ICD-10-CM | POA: Diagnosis not present

## 2023-10-25 DIAGNOSIS — A419 Sepsis, unspecified organism: Secondary | ICD-10-CM | POA: Diagnosis not present

## 2023-10-25 LAB — CULTURE, BLOOD (ROUTINE X 2)
Culture: NO GROWTH
Culture: NO GROWTH

## 2023-10-25 MED ORDER — LACTATED RINGERS IV BOLUS
500.0000 mL | Freq: Once | INTRAVENOUS | Status: AC
  Administered 2023-10-25: 500 mL via INTRAVENOUS

## 2023-10-25 NOTE — Progress Notes (Addendum)
                  Subjective:   Summary: Patient is a 73 year old woman with a history of glioblastoma status postresection and radiation therapy, recurrent seizures, multiple recent hospital admissions, brought in by EMS from her nursing facility for altered mental status after a fall from bed.  Pt did not sleep well last night but does not have any new or worsening pain. Remains pleasant and appears comfortable in bed.   Objective:  Vital signs in last 24 hours: Vitals:   10/24/23 1701 10/24/23 2014 10/24/23 2311 10/25/23 0302  BP: (!) 116/51 120/76 (!) 90/46 122/81  Pulse: (!) 59 62 62 62  Resp: 19 17 19 14   Temp: 97.6 F (36.4 C) 97.7 F (36.5 C) (!) 97.4 F (36.3 C) (!) 97.4 F (36.3 C)  TempSrc: Oral Oral Axillary Oral  SpO2: 100% 94% 92% 98%  Weight:      Height:       Supplemental O2: Room Air SpO2: 98 %  Physical Exam:  Constitutional: Chronically ill-appearing. No acute distress Neuro: A&O x 1 Skin: poor skin turgor   Assessment/Plan:   Altered mental status Ground level fall Hx glioblastoma s/p resection and radiation with residual deficits Remains at her baseline mental status which is alert to person only and intermittently worse.  We will continue with plan to return to previous SNF, [facility], pending insurance authorization.    Functional incontinence Continuing foley catheter at this time, defer voiding trial to outpatient physician but we do believe that continued catheter placement will benefit in the healing of her sacral wound.  Seizures EEG suggestive of cortical dysfunction likely secondary to structural abnormality as expected following surgery. No seizures or epileptiform changes seen.  -Continue home Keppra 1500mg  BID, lacosamide 200mg  daily, clobazam 10mg  BID  Stage 1 bilateral heel ulcers Physical exam shows nonblanching erythema of the bilateral posterior heels. - Heel elevation  Sacral wound stage 3 Continue bariatric bed, foley  catheter to avoid wound contamination with patient's history of urinary incontinence  BRBPR No more reported rectal bleeding and vitals remain stable. Still most likely cause was hemorrhoidal bleed or irritation from her large stool ball that has been relieved.   Thrombocytopenia Persistent thrombocytopenia after pancytopenia during/following chemoradiation for GBM treatment. Stable this admission. 30-40.  Elevated lactate, resolved Hypertension not requiring treatment at this time: Holding home antihypertensives Hypothyroidism: Continue home levothyroxine 150mcg  Diet: Dys 3 VTE: SCD Code: Full  Dispo: Anticipated discharge to Skilled nursing facility pending insurance authorization.  Patient is medically stable for transfer to SNF.  Cleven Dallas, DO Internal Medicine Resident, PGY-2 Please contact the on call pager at [phone] for any urgent or emergent needs. 6:15 AM 10/25/2023

## 2023-10-25 NOTE — Plan of Care (Signed)

## 2023-10-26 DIAGNOSIS — R5381 Other malaise: Secondary | ICD-10-CM | POA: Diagnosis not present

## 2023-10-26 DIAGNOSIS — A419 Sepsis, unspecified organism: Secondary | ICD-10-CM | POA: Diagnosis not present

## 2023-10-26 DIAGNOSIS — C719 Malignant neoplasm of brain, unspecified: Secondary | ICD-10-CM | POA: Diagnosis not present

## 2023-10-26 DIAGNOSIS — E861 Hypovolemia: Secondary | ICD-10-CM | POA: Diagnosis not present

## 2023-10-26 MED ORDER — POLYETHYLENE GLYCOL 3350 17 G PO PACK: 17.0000 g | PACK | Freq: Every day | ORAL | Status: DC

## 2023-10-26 MED ORDER — LORAZEPAM 1 MG PO TABS: 1.0000 mg | ORAL_TABLET | Freq: Every day | ORAL | 0 refills | Status: DC | PRN

## 2023-10-26 MED ORDER — DEXAMETHASONE 2 MG PO TABS
3.0000 mg | ORAL_TABLET | Freq: Every day | ORAL | Status: DC
  Filled 2023-10-26: qty 1.5
  Filled 2023-10-26: qty 2

## 2023-10-26 MED ORDER — DEXAMETHASONE 1.5 MG PO TABS: 3.0000 mg | ORAL_TABLET | Freq: Every day | ORAL | Status: DC

## 2023-10-26 MED ORDER — CLOBAZAM 10 MG PO TABS: 10.0000 mg | ORAL_TABLET | Freq: Two times a day (BID) | ORAL | 0 refills | Status: DC

## 2023-10-26 MED ORDER — SENNOSIDES-DOCUSATE SODIUM 8.6-50 MG PO TABS: 1.0000 | ORAL_TABLET | Freq: Every day | ORAL | Status: DC

## 2023-10-26 NOTE — Plan of Care (Signed)

## 2023-10-26 NOTE — Progress Notes (Signed)
 1510 Attempted to call report no answer   1513 Attempted to call report no answer

## 2023-10-26 NOTE — Plan of Care (Signed)

## 2023-10-26 NOTE — Progress Notes (Signed)
 1112 This RN message to MD "Hey! today is day 3 with no food intake, low fluid intake. Husband was here yesterday and did not want her to get NG tube, but he is not here today so far."   Dana Dallas, DO message back "Thank you."   Patient has discharge order. Transport set to pick up at 3:30pm-4pm. This RN message MD "is the plan to still discharge pt today even though she's still not eating or drinking?"   MD message back " I think overall she is essentially at her baseline. I'm guessing her poor cognitive status is worsened by some hospital delirium. We don't have anything reversible to treat while she's here, so I think the best thing is get her back to an environment she's used to"  1510 This RN attempted to call report, no answer at this time.

## 2023-10-26 NOTE — Progress Notes (Cosign Needed Addendum)
                  Subjective:   Summary: Patient is a 73 year old woman with a history of glioblastoma status postresection and radiation therapy, recurrent seizures, multiple recent hospital admissions, brought in by EMS from her nursing facility for altered mental status after a fall from bed.  Patient was delirious when seen on morning rounds. She was very anxious about multiple concerns. Garbled speech, difficult to interpret  I saw the patient later and she did appear calmer and was more understandable. Still A&O x1   Objective:  Vital signs in last 24 hours: Vitals:   10/25/23 2058 10/26/23 0406 10/26/23 0411 10/26/23 0858  BP: (!) 111/97 123/74 123/74 103/66  Pulse: 84 85 85 86  Resp: 19 18 18    Temp: 98.3 F (36.8 C) 98.1 F (36.7 C) 98.1 F (36.7 C) 98 F (36.7 C)  TempSrc: Oral Oral Axillary Oral  SpO2: 98% 99% 99% 98%  Weight:      Height:       Supplemental O2: Room Air SpO2: 98 %  Physical Exam:  Constitutional: Chronically ill-appearing Neuro: A&O x 1 Psych: Delirious. Garbled speech, anxious affect  Assessment/Plan:   Altered mental status Hx glioblastoma s/p resection and radiation with residual deficits Remains at her baseline mental status which is alert to person only and intermittently worse. Per family discussion, patient's gout pain was a major barrier to her participating in therapy in the past. Her family is optimistic that with the resolution of her gout flare she will be much more able to participate in rehab. Patient is medically ready for discharge to SNF pending insurance authorization. Peer to peer meeting planned for today.  Functional incontinence Continuing foley catheter at this time, defer voiding trial to outpatient physician but we do believe that continued catheter placement will benefit in the healing of her sacral wound.  Seizures EEG suggestive of cortical dysfunction likely secondary to structural abnormality as expected  following surgery. No seizures or epileptiform changes seen.  -Continue home Keppra 1500mg  BID, lacosamide 200mg  daily, clobazam 10mg  BID  Stage 1 bilateral heel ulcers Physical exam shows nonblanching erythema of the bilateral posterior heels. - Heel elevation  Sacral wound stage 3 Continue bariatric bed, foley catheter to avoid wound contamination with patient's history of urinary incontinence  Elevated lactate, resolved: no further IV fluids indicated  Hypertension not requiring treatment at this time: Holding home antihypertensives Hypothyroidism: Continue home levothyroxine 150mcg  Diet: Dys 3 VTE: SCD Code: Full  Dispo: Anticipated discharge to Skilled nursing facility pending insurance authorization.  Patient is medically stable for transfer to SNF.  Jayson Michael, MD Internal Medicine Resident, PGY-1 Please contact the on call pager at [phone] for any urgent or emergent needs. 10:27 AM 10/26/2023

## 2023-10-26 NOTE — TOC Transition Note (Signed)
 Transition of Care [facility]) - Discharge Note   Patient Details  Name: Dana Morton MRN: [id number] Date of Birth: 1950/09/02  Transition [facility] Care [facility] CM/SW Contact:  Katrinka Parr, LCSW Phone Number: 10/26/2023, 2:38 PM   Clinical Narrative:     Auth approved 4/14- 4/16 [id number]   Per MD patient ready for DC to [facility]. RN, patient, patient's family, and facility notified of DC. Discharge Summary and FL2 sent to facility. RN to call report prior to discharge [phone] ). DC packet on chart. Ambulance transport requested for patient.   CSW will sign off for now as social work intervention is no longer needed. Please consult us  again if new needs arise.   Final next level of care: Skilled Nursing Facility Barriers to Discharge: No Barriers Identified   Patient Goals and CMS Choice            Discharge Placement              Patient chooses bed at: [facility] Patient to be transferred to facility by: [facility] Name of family member notified: Spouse [name] Patient and family notified of of transfer: 10/26/23  Discharge Plan and Services Additional resources added to the After Visit Summary for   In-house Referral: Clinical Social Work                                   Social Drivers of Health (SDOH) Interventions SDOH Screenings   Food Insecurity: Patient Unable To Answer (10/20/2023)  Housing: Patient Unable To Answer (10/20/2023)  Transportation Needs: Patient Unable To Answer (10/20/2023)  Utilities: Patient Unable To Answer (10/20/2023)  Financial Resource Strain: Low Risk  (10/03/2023)   Received from [facility]  Social Connections: Unknown (10/20/2023)  Recent Concern: Social Connections - Moderately Isolated (08/08/2023)  Tobacco Use: Low Risk  (10/01/2023)     Readmission Risk Interventions     No data to display

## 2023-10-26 NOTE — Discharge Summary (Addendum)
 Name: MILLIANA REDDOCH MRN: [id number] DOB: June 29, 1951 73 y.o. PCP: Melchor Spoon, MD  Date of Admission: 10/20/2023  9:46 AM Date of Discharge: 10/26/23 Attending Physician: Dr. Broadus Canes    Discharge Diagnosis: Principal Problem:   Head contusion due to accidental fall from bed Active Problems:   Glioblastoma, s/p surgery and radiation (HCC)   Seizure disorder, secondary (HCC)   Subacute delirium   Pressure injury of skin   Urinary retention   Declining functional status   Hypotension partly due to hypovolemia (volume replaced)    Discharge Medications: Allergies as of 10/26/2023       Reactions   Celebrex [celecoxib] Nausea Only   Codeine Nausea Only        Medication List     PAUSE taking these medications    lisinopril  40 MG tablet Wait to take this until your doctor or other care provider tells you to start again. Commonly known as: ZESTRIL  Take 40 mg by mouth at bedtime.   predniSONE  20 MG tablet Wait to take this until your doctor or other care provider tells you to start again. Commonly known as: DELTASONE  Take 20 mg by mouth daily with breakfast.       TAKE these medications    bisoprolol  10 MG tablet Commonly known as: ZEBETA  Take 10 mg by mouth daily.   CALCIUM  600+D PO Take 1 tablet by mouth daily.   cetirizine 10 MG tablet Commonly known as: ZYRTEC Take 10 mg by mouth daily.   Cholecalciferol  25 MCG (1000 UT) tablet Take 1,000 Units by mouth daily.   cloBAZam  10 MG tablet Commonly known as: ONFI  Take 10 mg by mouth 2 (two) times daily.   cyanocobalamin  1000 MCG tablet Take 1 tablet (1,000 mcg total) by mouth daily.   dexamethasone  1.5 MG tablet Commonly known as: DECADRON  Take 2 tablets (3 mg total) by mouth daily.   lacosamide  200 MG Tabs tablet Commonly known as: VIMPAT  Take 1 tablet (200 mg total) by mouth 2 (two) times daily.   levETIRAcetam  750 MG tablet Commonly known as: KEPPRA  Take 2 tablets (1,500 mg total) by  mouth 2 (two) times daily.   levothyroxine  137 MCG tablet Commonly known as: SYNTHROID  Take 137 mcg by mouth daily before breakfast.   LORazepam  1 MG tablet Commonly known as: ATIVAN  Take 1 mg by mouth daily as needed for anxiety.   lovastatin 40 MG tablet Commonly known as: MEVACOR Take 40 mg by mouth at bedtime.   mirtazapine 7.5 MG tablet Commonly known as: REMERON Take 7.5 mg by mouth at bedtime.   multivitamin with minerals Tabs tablet Take 1 tablet by mouth daily. Centrum Silver for Women   omeprazole 20 MG capsule Commonly known as: PRILOSEC Take 20 mg by mouth daily before breakfast.   polyethylene glycol 17 g packet Commonly known as: MIRALAX  / GLYCOLAX  Take 17 g by mouth daily. Start taking on: October 27, 2023   senna-docusate 8.6-50 MG tablet Commonly known as: Senokot-S Take 1 tablet by mouth at bedtime.        Disposition and follow-up:   Ms.Tessie RALYNN SAN was discharged from [facility] in Stable condition.  At the hospital follow up visit please address:  1.  Follow-up:  Decreased cognitive status s/p glioblastoma resection. Patient had been receiving IV dexamethasone  prior to admission, which was switched to oral formulation. Per chart review, this had been prescribed by [facility] after her resection.   Functional incontinence: foley catheter was  left in at time of hospital discharge to assist in healing of sacral wound. Voiding trial had been planned but patient was admitted during this time.   HTN: Patient's bisoprolol  was continued but lisinopril  was held due to low/normal blood pressures. PCP may restart lisinopril  as indicated.   Hypothyroidism: TSH was elevated to 7.8, adjust synthroid  as necessary  2.  Labs / imaging needed at time of follow-up: none  3.  Pending labs/ test needing follow-up: None  4.  Medication Changes  Started:  Stopped: held lisinopril , prednisone  until restarted by PCP  Changed:  Abx -   End  Date:  Follow-up Appointments: Per PCP  Hospital Course by problem list:  Altered mental status Hx glioblastoma s/p resection and radiation with residual deficits Patient was brought to the ED from her nursing facility due to altered mental status after a fall from her bed. On exam, patient was intermittently alert and oriented with waxing/waning cognition, no other neurological deficits. CT imaging of the head and spine were unrevealing for acute intracranial or spinal abnormalities with no evidence of bleeding. Lactate was initially elevated with improvement after IV fluid resuscitation. Labs were otherwise reassuring.  With more collateral information from family, patient appeared to be at her baseline mental status. She has had significant cognitive deficits and recurrent seizures since she had a glioblastoma resected in November 2024. She has had multiple hospital admissions since then and little significant improvement despite a high level of medical care. Palliative team was consulted, who helped determine patient will continue full code/full scope of treatment with continued GOC discussions once patient is discharged to her SNF. Patient was discharged to her SNF in stable condition.   Functional incontinence Patient already had a foley catheter at the time of admission due to functional incontinence and to help with healing of a sacral wound. The foley was replaced in order to get a better urine sample and left in at the time of discharge.   Seizure disorder There was concern for either status epilepticus or a prolonged postictal state at the time of admission. EEG was suggestive of cortical dysfunction likely secondary to structural abnormality as expected following surgery. No seizures or epileptiform changes seen. Patient's home Keppra , lacosamide , and clobazam  were continued throughout the admission.   Stage 1 bilateral heel ulcers: heel elevation to prevent progression of  ulcers  Stage 3 sacral ulcer: Wound care was consulted with the following recommendations: Cleanse entire buttocks/sacral area (intact skin and open wounds) with Vashe wound cleanser (do not rinse and allow to air dry). Apply Medihoney to wound bed daily, cover with dry gauze. Cut a strip of silver hydrofiber ([place] [phone]) and using a Q tip applicator insert into area of depth (hole) medial buttocks. Cover entire area with silicone foam or ABD pad whichever is preferred. . Foley catheter was left in at time of discharge to assist in wound healing  Elevated lactate: lactate was elevated at time of admission, likely due to dehydration. Lactate resolved to normal levels after IV fluid resuscitation  Hx of hypertension: BP remained low/normal throughout admission, and home antihypertensives were not administered.   Hypothyroidism: Home dose levothyroxine  was continued during admission  Constipation: some blood noted during manual disimpaction, though to be due to mild mucosal trauma due to constipation. Patient received disimpaction and an enema with more regular bowel movements and no further episodes of blood. Recommend continuing bowel regimen after discharge  Thrombocytopenia: Chronic issue since receiving radiation/chemotherapy. Platelets stable during admission 30k-40k.  Poor oral intake:  Patient had little po intake during this admission due to waxing/waning mental status, which is her baseline. Likely an element of hospital delirium contributing to slightly worsened mental status. Other reversible causes for altered mental status or infection have been ruled out. Patient will need to be monitored and helped to encourage po, though I suspect she will continue to decline over the coming months.   Discharge Subjective: Patient was delirious when seen on morning rounds. She was very anxious about multiple concerns. Garbled speech, difficult to interpret   I saw the patient later and she did  appear calmer and was more understandable. Still A&O x1  Discharge Exam:   BP 103/66 (BP Location: Left Arm)   Pulse 86   Temp 98 F (36.7 C) (Oral)   Resp 18   Ht 5\' 7"  (1.702 m)   Wt 97.5 kg   SpO2 98%   BMI 33.67 kg/m  Constitutional: Chronically ill-appearing Neuro: A&O x 1 Psych: Delirious. Garbled speech, anxious affect  Pertinent Labs, Studies, and Procedures:     Latest Ref Rng & Units 10/24/2023    2:54 PM 10/21/2023    5:25 AM 10/20/2023   10:24 AM  CBC  WBC 4.0 - 10.5 K/uL 4.0  4.0  5.4   Hemoglobin 12.0 - 15.0 g/dL 9.9  9.7  16.1   Hematocrit 36.0 - 46.0 % 29.2  29.3  31.4   Platelets 150 - 400 K/uL 30  32  37        Latest Ref Rng & Units 10/21/2023    5:25 AM 10/20/2023   10:24 AM 10/01/2023   12:25 PM  CMP  Glucose 70 - 99 mg/dL 98  096  045   BUN 8 - 23 mg/dL 21  33  60   Creatinine 0.44 - 1.00 mg/dL 4.09  8.11  9.14   Sodium 135 - 145 mmol/L 140  137  140   Potassium 3.5 - 5.1 mmol/L 3.5  4.2  3.8   Chloride 98 - 111 mmol/L 104  102  103   CO2 22 - 32 mmol/L 24  24  24    Calcium  8.9 - 10.3 mg/dL 9.1  9.1  9.0   Total Protein 6.5 - 8.1 g/dL  7.0  7.7   Total Bilirubin 0.0 - 1.2 mg/dL  0.5  0.7   Alkaline Phos 38 - 126 U/L  116  90   AST 15 - 41 U/L  59  35   ALT 0 - 44 U/L  78  30     CT HEAD WO CONTRAST ( ) Result Date: 10/20/2023 CLINICAL DATA:  Provided history: Facial trauma, blunt. Additional history provided: Unwitnessed fall (striking left side of head above eye). EXAM: CT HEAD WITHOUT CONTRAST CT MAXILLOFACIAL WITHOUT CONTRAST CT CERVICAL SPINE WITHOUT CONTRAST TECHNIQUE: Multidetector CT imaging of the head, cervical spine, and maxillofacial structures were performed using the standard protocol without intravenous contrast. Multiplanar CT image reconstructions of the cervical spine and maxillofacial structures were also generated. RADIATION DOSE REDUCTION: This exam was performed according to the departmental dose-optimization program which includes  automated exposure control, adjustment of the mA and/or kV according to patient size and/or use of iterative reconstruction technique. COMPARISON:  Head CT 10/01/2023.  Brain MRI 08/05/2023. FINDINGS: CT HEAD FINDINGS Brain: Generalized cerebral atrophy. Resection cavity within the left temporal lobe, unchanged in appearance as compared to the recent prior head CT of 10/01/2023. 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 Mild patchy and ill-defined hypoattenuation elsewhere within the cerebral white matter, nonspecific but compatible with chronic small ischemic disease. There is no acute intracranial hemorrhage. No demarcated cortical infarct. No extra-axial fluid collection. No midline shift. Vascular: No hyperdense vessel.  Atherosclerotic calcifications. Skull: No evidence of an acute calvarial fracture. Left temporoparietal cranioplasty. CT MAXILLOFACIAL FINDINGS Osseous: No evidence of an acute maxillofacial fracture. Orbits: Left anterior scalp/periorbital hematoma. No acute finding within the orbits. Sinuses: Small volume fluid within the right sphenoid sinus. Soft tissues: Left anterior scalp/periorbital hematoma. CT CERVICAL SPINE FINDINGS Alignment: Levocurvature of the cervical spine. Nonspecific reversal of the expected cervical lordosis. 2 mm C2-C3 grade 1 anterolisthesis. Skull base and vertebrae: The basion-dental and atlanto-dental intervals are maintained.No evidence of acute fracture to the cervical spine. Soft tissues and spinal canal: No prevertebral fluid or swelling. No visible canal hematoma. Disc levels: Cervical spondylosis with multilevel disc space narrowing, disc bulges/central disc protrusions, posterior disc osteophyte complexes, uncovertebral hypertrophy and facet arthropathy. Disc space narrowing is greatest at C4-C5, C5-C6 and C6-C7 (moderate at these levels). Multilevel spinal canal stenosis. Most notably, posterior disc osteophyte complexes contribute  to at least moderate spinal canal stenosis at C4-C5 and C5-C6. Multilevel bony neural foraminal narrowing. Multilevel ventral osteophytes. Degenerative changes also present at the C1-C2 articulation. Upper chest: No consolidation within the imaged lung apices. No visible pneumothorax. IMPRESSION: CT head: 1.  No evidence of an acute intracranial abnormality. 2. Postoperative changes within the left temporal lobe. 3. Parenchymal atrophy and chronic small vessel ischemic disease. CT maxillofacial: 1. No evidence of an acute maxillofacial fracture. 2. Left anterior scalp/periorbital hematoma. 3. Mild right sphenoid sinusitis. CT cervical spine: 1. No evidence of an acute cervical spine fracture. 2. Nonspecific reversal of the expected cervical lordosis. 3. Levocurvature of the cervical spine. 4. Mild C2-C3 grade 1 anterolisthesis. 5. Cervical spondylosis as outlined within the body of the report. Spinal canal stenosis is greatest at C4-C5 and C5-C6 (at least moderate at these levels). Multilevel bony neural foraminal narrowing. Electronically Signed   By: Bascom Lily D.O.   On: 10/20/2023 14:55   CT Cervical Spine Wo Contrast Result Date: 10/20/2023 CLINICAL DATA:  Provided history: Facial trauma, blunt. Additional history provided: Unwitnessed fall (striking left side of head above eye). EXAM: CT HEAD WITHOUT CONTRAST CT MAXILLOFACIAL WITHOUT CONTRAST CT CERVICAL SPINE WITHOUT CONTRAST TECHNIQUE: Multidetector CT imaging of the head, cervical spine, and maxillofacial structures were performed using the standard protocol without intravenous contrast. Multiplanar CT image reconstructions of the cervical spine and maxillofacial structures were also generated. RADIATION DOSE REDUCTION: This exam was performed according to the departmental dose-optimization program which includes automated exposure control, adjustment of the mA and/or kV according to patient size and/or use of iterative reconstruction technique.  COMPARISON:  Head CT 10/01/2023.  Brain MRI 08/05/2023. FINDINGS: CT HEAD FINDINGS Brain: Generalized cerebral atrophy. Resection cavity within the left temporal lobe, unchanged in appearance as compared to the recent prior head CT of 10/01/2023. 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 Mild patchy and ill-defined hypoattenuation elsewhere within the cerebral white matter, nonspecific but compatible with chronic small ischemic disease. There is no acute intracranial hemorrhage. No demarcated cortical infarct. No extra-axial fluid collection. No midline shift. Vascular: No hyperdense vessel.  Atherosclerotic calcifications. Skull: No evidence of an acute calvarial fracture. Left temporoparietal cranioplasty. CT MAXILLOFACIAL FINDINGS Osseous: No evidence of an acute maxillofacial fracture. Orbits: Left anterior scalp/periorbital hematoma. No acute finding within the orbits. Sinuses:  Small volume fluid within the right sphenoid sinus. Soft tissues: Left anterior scalp/periorbital hematoma. CT CERVICAL SPINE FINDINGS Alignment: Levocurvature of the cervical spine. Nonspecific reversal of the expected cervical lordosis. 2 mm C2-C3 grade 1 anterolisthesis. Skull base and vertebrae: The basion-dental and atlanto-dental intervals are maintained.No evidence of acute fracture to the cervical spine. Soft tissues and spinal canal: No prevertebral fluid or swelling. No visible canal hematoma. Disc levels: Cervical spondylosis with multilevel disc space narrowing, disc bulges/central disc protrusions, posterior disc osteophyte complexes, uncovertebral hypertrophy and facet arthropathy. Disc space narrowing is greatest at C4-C5, C5-C6 and C6-C7 (moderate at these levels). Multilevel spinal canal stenosis. Most notably, posterior disc osteophyte complexes contribute to at least moderate spinal canal stenosis at C4-C5 and C5-C6. Multilevel bony neural foraminal narrowing. Multilevel ventral  osteophytes. Degenerative changes also present at the C1-C2 articulation. Upper chest: No consolidation within the imaged lung apices. No visible pneumothorax. IMPRESSION: CT head: 1.  No evidence of an acute intracranial abnormality. 2. Postoperative changes within the left temporal lobe. 3. Parenchymal atrophy and chronic small vessel ischemic disease. CT maxillofacial: 1. No evidence of an acute maxillofacial fracture. 2. Left anterior scalp/periorbital hematoma. 3. Mild right sphenoid sinusitis. CT cervical spine: 1. No evidence of an acute cervical spine fracture. 2. Nonspecific reversal of the expected cervical lordosis. 3. Levocurvature of the cervical spine. 4. Mild C2-C3 grade 1 anterolisthesis. 5. Cervical spondylosis as outlined within the body of the report. Spinal canal stenosis is greatest at C4-C5 and C5-C6 (at least moderate at these levels). Multilevel bony neural foraminal narrowing. Electronically Signed   By: Bascom Lily D.O.   On: 10/20/2023 14:55   CT Maxillofacial Wo Contrast Result Date: 10/20/2023 CLINICAL DATA:  Provided history: Facial trauma, blunt. Additional history provided: Unwitnessed fall (striking left side of head above eye). EXAM: CT HEAD WITHOUT CONTRAST CT MAXILLOFACIAL WITHOUT CONTRAST CT CERVICAL SPINE WITHOUT CONTRAST TECHNIQUE: Multidetector CT imaging of the head, cervical spine, and maxillofacial structures were performed using the standard protocol without intravenous contrast. Multiplanar CT image reconstructions of the cervical spine and maxillofacial structures were also generated. RADIATION DOSE REDUCTION: This exam was performed according to the departmental dose-optimization program which includes automated exposure control, adjustment of the mA and/or kV according to patient size and/or use of iterative reconstruction technique. COMPARISON:  Head CT 10/01/2023.  Brain MRI 08/05/2023. FINDINGS: CT HEAD FINDINGS Brain: Generalized cerebral atrophy. Resection  cavity within the left temporal lobe, unchanged in appearance as compared to the recent prior head CT of 10/01/2023. 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 Mild patchy and ill-defined hypoattenuation elsewhere within the cerebral white matter, nonspecific but compatible with chronic small ischemic disease. There is no acute intracranial hemorrhage. No demarcated cortical infarct. No extra-axial fluid collection. No midline shift. Vascular: No hyperdense vessel.  Atherosclerotic calcifications. Skull: No evidence of an acute calvarial fracture. Left temporoparietal cranioplasty. CT MAXILLOFACIAL FINDINGS Osseous: No evidence of an acute maxillofacial fracture. Orbits: Left anterior scalp/periorbital hematoma. No acute finding within the orbits. Sinuses: Small volume fluid within the right sphenoid sinus. Soft tissues: Left anterior scalp/periorbital hematoma. CT CERVICAL SPINE FINDINGS Alignment: Levocurvature of the cervical spine. Nonspecific reversal of the expected cervical lordosis. 2 mm C2-C3 grade 1 anterolisthesis. Skull base and vertebrae: The basion-dental and atlanto-dental intervals are maintained.No evidence of acute fracture to the cervical spine. Soft tissues and spinal canal: No prevertebral fluid or swelling. No visible canal hematoma. Disc levels: Cervical spondylosis with multilevel disc space narrowing, disc  bulges/central disc protrusions, posterior disc osteophyte complexes, uncovertebral hypertrophy and facet arthropathy. Disc space narrowing is greatest at C4-C5, C5-C6 and C6-C7 (moderate at these levels). Multilevel spinal canal stenosis. Most notably, posterior disc osteophyte complexes contribute to at least moderate spinal canal stenosis at C4-C5 and C5-C6. Multilevel bony neural foraminal narrowing. Multilevel ventral osteophytes. Degenerative changes also present at the C1-C2 articulation. Upper chest: No consolidation within the imaged lung apices. No  visible pneumothorax. IMPRESSION: CT head: 1.  No evidence of an acute intracranial abnormality. 2. Postoperative changes within the left temporal lobe. 3. Parenchymal atrophy and chronic small vessel ischemic disease. CT maxillofacial: 1. No evidence of an acute maxillofacial fracture. 2. Left anterior scalp/periorbital hematoma. 3. Mild right sphenoid sinusitis. CT cervical spine: 1. No evidence of an acute cervical spine fracture. 2. Nonspecific reversal of the expected cervical lordosis. 3. Levocurvature of the cervical spine. 4. Mild C2-C3 grade 1 anterolisthesis. 5. Cervical spondylosis as outlined within the body of the report. Spinal canal stenosis is greatest at C4-C5 and C5-C6 (at least moderate at these levels). Multilevel bony neural foraminal narrowing. Electronically Signed   By: Bascom Lily D.O.   On: 10/20/2023 14:55   DG Chest Port 1 View Result Date: 10/20/2023 CLINICAL DATA:  Questionable sepsis. EXAM: PORTABLE CHEST 1 VIEW COMPARISON:  10/01/2023 FINDINGS: Artifact from EKG leads. Low volume chest. There is no edema, consolidation, effusion, or pneumothorax. Generous heart size accentuated by technique and stable. IMPRESSION: Stable low volume chest.  No acute finding. Electronically Signed   By: Ronnette Coke M.D.   On: 10/20/2023 11:28    Signed: Jayson Michael, MD PGY-1 10/26/2023, 1:56 PM   Pager: [phone]

## 2023-10-26 NOTE — Progress Notes (Signed)
 Patient transport here for pickup.

## 2023-10-26 NOTE — Progress Notes (Signed)
 Report called and given to nurse at receiving facility. Has concern about patient eating status will call back after talking with her supervisor.

## 2023-10-27 NOTE — Progress Notes (Signed)
 Give ams covid swab ordered

## 2023-11-05 ENCOUNTER — Other Ambulatory Visit: Payer: Self-pay

## 2023-11-05 ENCOUNTER — Encounter (HOSPITAL_COMMUNITY): Payer: Self-pay | Admitting: Emergency Medicine

## 2023-11-05 ENCOUNTER — Emergency Department (HOSPITAL_COMMUNITY)

## 2023-11-05 ENCOUNTER — Inpatient Hospital Stay (HOSPITAL_COMMUNITY)
Admission: EM | Admit: 2023-11-05 | Discharge: 2023-11-22 | DRG: 698 | Disposition: A | Discharge status: Hospice | Source: Skilled Nursing Facility | Attending: Internal Medicine | Admitting: Internal Medicine

## 2023-11-05 DIAGNOSIS — Z85841 Personal history of malignant neoplasm of brain: Secondary | ICD-10-CM

## 2023-11-05 DIAGNOSIS — C719 Malignant neoplasm of brain, unspecified: Secondary | ICD-10-CM | POA: Diagnosis not present

## 2023-11-05 DIAGNOSIS — E872 Acidosis, unspecified: Secondary | ICD-10-CM | POA: Diagnosis present

## 2023-11-05 DIAGNOSIS — G40909 Epilepsy, unspecified, not intractable, without status epilepticus: Secondary | ICD-10-CM

## 2023-11-05 DIAGNOSIS — E039 Hypothyroidism, unspecified: Secondary | ICD-10-CM | POA: Diagnosis present

## 2023-11-05 DIAGNOSIS — Y846 Urinary catheterization as the cause of abnormal reaction of the patient, or of later complication, without mention of misadventure at the time of the procedure: Secondary | ICD-10-CM | POA: Diagnosis present

## 2023-11-05 DIAGNOSIS — E1122 Type 2 diabetes mellitus with diabetic chronic kidney disease: Secondary | ICD-10-CM | POA: Diagnosis present

## 2023-11-05 DIAGNOSIS — N189 Chronic kidney disease, unspecified: Secondary | ICD-10-CM | POA: Diagnosis present

## 2023-11-05 DIAGNOSIS — R6521 Severe sepsis with septic shock: Secondary | ICD-10-CM | POA: Diagnosis present

## 2023-11-05 DIAGNOSIS — I10 Essential (primary) hypertension: Secondary | ICD-10-CM | POA: Diagnosis present

## 2023-11-05 DIAGNOSIS — D696 Thrombocytopenia, unspecified: Secondary | ICD-10-CM | POA: Diagnosis present

## 2023-11-05 DIAGNOSIS — I129 Hypertensive chronic kidney disease with stage 1 through stage 4 chronic kidney disease, or unspecified chronic kidney disease: Secondary | ICD-10-CM | POA: Diagnosis present

## 2023-11-05 DIAGNOSIS — N390 Urinary tract infection, site not specified: Secondary | ICD-10-CM | POA: Diagnosis not present

## 2023-11-05 DIAGNOSIS — L89150 Pressure ulcer of sacral region, unstageable: Secondary | ICD-10-CM | POA: Diagnosis present

## 2023-11-05 DIAGNOSIS — R001 Bradycardia, unspecified: Secondary | ICD-10-CM | POA: Diagnosis not present

## 2023-11-05 DIAGNOSIS — Z7952 Long term (current) use of systemic steroids: Secondary | ICD-10-CM

## 2023-11-05 DIAGNOSIS — Y842 Radiological procedure and radiotherapy as the cause of abnormal reaction of the patient, or of later complication, without mention of misadventure at the time of the procedure: Secondary | ICD-10-CM | POA: Diagnosis present

## 2023-11-05 DIAGNOSIS — R627 Adult failure to thrive: Secondary | ICD-10-CM | POA: Diagnosis present

## 2023-11-05 DIAGNOSIS — E66811 Obesity, class 1: Secondary | ICD-10-CM | POA: Diagnosis present

## 2023-11-05 DIAGNOSIS — Z66 Do not resuscitate: Secondary | ICD-10-CM | POA: Diagnosis not present

## 2023-11-05 DIAGNOSIS — R4701 Aphasia: Secondary | ICD-10-CM | POA: Diagnosis present

## 2023-11-05 DIAGNOSIS — Z1152 Encounter for screening for COVID-19: Secondary | ICD-10-CM | POA: Diagnosis not present

## 2023-11-05 DIAGNOSIS — N179 Acute kidney failure, unspecified: Secondary | ICD-10-CM | POA: Diagnosis present

## 2023-11-05 DIAGNOSIS — T83511A Infection and inflammatory reaction due to indwelling urethral catheter, initial encounter: Secondary | ICD-10-CM | POA: Diagnosis present

## 2023-11-05 DIAGNOSIS — G9341 Metabolic encephalopathy: Secondary | ICD-10-CM | POA: Insufficient documentation

## 2023-11-05 DIAGNOSIS — L89312 Pressure ulcer of right buttock, stage 2: Secondary | ICD-10-CM | POA: Diagnosis present

## 2023-11-05 DIAGNOSIS — E43 Unspecified severe protein-calorie malnutrition: Secondary | ICD-10-CM | POA: Insufficient documentation

## 2023-11-05 DIAGNOSIS — I959 Hypotension, unspecified: Secondary | ICD-10-CM | POA: Diagnosis not present

## 2023-11-05 DIAGNOSIS — D638 Anemia in other chronic diseases classified elsewhere: Secondary | ICD-10-CM | POA: Diagnosis present

## 2023-11-05 DIAGNOSIS — Z781 Physical restraint status: Secondary | ICD-10-CM

## 2023-11-05 DIAGNOSIS — E875 Hyperkalemia: Secondary | ICD-10-CM | POA: Diagnosis present

## 2023-11-05 DIAGNOSIS — A419 Sepsis, unspecified organism: Principal | ICD-10-CM | POA: Diagnosis present

## 2023-11-05 DIAGNOSIS — E119 Type 2 diabetes mellitus without complications: Secondary | ICD-10-CM

## 2023-11-05 DIAGNOSIS — E7800 Pure hypercholesterolemia, unspecified: Secondary | ICD-10-CM | POA: Diagnosis present

## 2023-11-05 DIAGNOSIS — Z79890 Hormone replacement therapy (postmenopausal): Secondary | ICD-10-CM

## 2023-11-05 DIAGNOSIS — R569 Unspecified convulsions: Secondary | ICD-10-CM | POA: Diagnosis not present

## 2023-11-05 DIAGNOSIS — E860 Dehydration: Secondary | ICD-10-CM | POA: Diagnosis present

## 2023-11-05 DIAGNOSIS — J9601 Acute respiratory failure with hypoxia: Secondary | ICD-10-CM | POA: Diagnosis present

## 2023-11-05 DIAGNOSIS — Z7401 Bed confinement status: Secondary | ICD-10-CM

## 2023-11-05 DIAGNOSIS — Z7189 Other specified counseling: Secondary | ICD-10-CM

## 2023-11-05 DIAGNOSIS — Z515 Encounter for palliative care: Secondary | ICD-10-CM | POA: Diagnosis not present

## 2023-11-05 DIAGNOSIS — E876 Hypokalemia: Secondary | ICD-10-CM | POA: Diagnosis present

## 2023-11-05 DIAGNOSIS — R4182 Altered mental status, unspecified: Secondary | ICD-10-CM

## 2023-11-05 DIAGNOSIS — J9600 Acute respiratory failure, unspecified whether with hypoxia or hypercapnia: Secondary | ICD-10-CM | POA: Diagnosis not present

## 2023-11-05 DIAGNOSIS — M1A9XX0 Chronic gout, unspecified, without tophus (tophi): Secondary | ICD-10-CM | POA: Diagnosis present

## 2023-11-05 DIAGNOSIS — Z6837 Body mass index (BMI) 37.0-37.9, adult: Secondary | ICD-10-CM | POA: Diagnosis not present

## 2023-11-05 LAB — RESP PANEL BY RT-PCR (RSV, FLU A&B, COVID)  RVPGX2
Influenza A by PCR: NEGATIVE
Influenza B by PCR: NEGATIVE
Resp Syncytial Virus by PCR: NEGATIVE
SARS Coronavirus 2 by RT PCR: NEGATIVE

## 2023-11-05 LAB — URINALYSIS, ROUTINE W REFLEX MICROSCOPIC
Glucose, UA: NEGATIVE mg/dL
Nitrite: POSITIVE — AB
Protein, ur: >=300 mg/dL — AB
Specific Gravity, Urine: >=1.03 (ref 1.005–1.030)
pH: 5 (ref 5.0–8.0)

## 2023-11-05 LAB — I-STAT VENOUS BLOOD GAS, ED
Acid-base deficit: 1 mmol/L (ref 0.0–2.0)
Bicarbonate: 22.4 mmol/L (ref 20.0–28.0)
Calcium, Ion: 0.97 mmol/L — ABNORMAL LOW (ref 1.15–1.40)
HCT: 28 % — ABNORMAL LOW (ref 36.0–46.0)
Hemoglobin: 9.5 g/dL — ABNORMAL LOW (ref 12.0–15.0)
O2 Saturation: 92 %
Potassium: 6.3 mmol/L (ref 3.5–5.1)
Sodium: 141 mmol/L (ref 135–145)
TCO2: 23 mmol/L (ref 22–32)
pCO2, Ven: 33.5 mmHg — ABNORMAL LOW (ref 44–60)
pH, Ven: 7.434 — ABNORMAL HIGH (ref 7.25–7.43)
pO2, Ven: 60 mmHg — ABNORMAL HIGH (ref 32–45)

## 2023-11-05 LAB — URINALYSIS, MICROSCOPIC (REFLEX): WBC, UA: >50 WBC/hpf (ref 0–5)

## 2023-11-05 LAB — COMPREHENSIVE METABOLIC PANEL WITH GFR
ALT: 47 U/L — ABNORMAL HIGH (ref 0–44)
AST: 81 U/L — ABNORMAL HIGH (ref 15–41)
Albumin: 2.8 g/dL — ABNORMAL LOW (ref 3.5–5.0)
Alkaline Phosphatase: 113 U/L (ref 38–126)
Anion gap: 14 (ref 5–15)
BUN: 57 mg/dL — ABNORMAL HIGH (ref 8–23)
CO2: 19 mmol/L — ABNORMAL LOW (ref 22–32)
Calcium: 8.6 mg/dL — ABNORMAL LOW (ref 8.9–10.3)
Chloride: 108 mmol/L (ref 98–111)
Creatinine, Ser: 3.03 mg/dL — ABNORMAL HIGH (ref 0.44–1.00)
GFR, Estimated: 16 mL/min — ABNORMAL LOW (ref >60)
Glucose, Bld: 135 mg/dL — ABNORMAL HIGH (ref 70–99)
Potassium: 5.1 mmol/L (ref 3.5–5.1)
Sodium: 141 mmol/L (ref 135–145)
Total Bilirubin: 1.2 mg/dL (ref 0.0–1.2)
Total Protein: 7 g/dL (ref 6.5–8.1)

## 2023-11-05 LAB — I-STAT CHEM 8, ED
BUN: 75 mg/dL — ABNORMAL HIGH (ref 8–23)
Calcium, Ion: 0.98 mmol/L — ABNORMAL LOW (ref 1.15–1.40)
Chloride: 111 mmol/L (ref 98–111)
Creatinine, Ser: 3.2 mg/dL — ABNORMAL HIGH (ref 0.44–1.00)
Glucose, Bld: 134 mg/dL — ABNORMAL HIGH (ref 70–99)
HCT: 30 % — ABNORMAL LOW (ref 36.0–46.0)
Hemoglobin: 10.2 g/dL — ABNORMAL LOW (ref 12.0–15.0)
Potassium: 6.3 mmol/L (ref 3.5–5.1)
Sodium: 141 mmol/L (ref 135–145)
TCO2: 22 mmol/L (ref 22–32)

## 2023-11-05 LAB — CBC WITH DIFFERENTIAL/PLATELET
Abs Immature Granulocytes: 0.05 10*3/uL (ref 0.00–0.07)
Basophils Absolute: 0 10*3/uL (ref 0.0–0.1)
Basophils Relative: 0 %
Eosinophils Absolute: 0 10*3/uL (ref 0.0–0.5)
Eosinophils Relative: 0 %
HCT: 32.3 % — ABNORMAL LOW (ref 36.0–46.0)
Hemoglobin: 10.2 g/dL — ABNORMAL LOW (ref 12.0–15.0)
Immature Granulocytes: 1 %
Lymphocytes Relative: 56 %
Lymphs Abs: 4 10*3/uL (ref 0.7–4.0)
MCH: 34.7 pg — ABNORMAL HIGH (ref 26.0–34.0)
MCHC: 31.6 g/dL (ref 30.0–36.0)
MCV: 109.9 fL — ABNORMAL HIGH (ref 80.0–100.0)
Monocytes Absolute: 0.6 10*3/uL (ref 0.1–1.0)
Monocytes Relative: 9 %
Neutro Abs: 2.5 10*3/uL (ref 1.7–7.7)
Neutrophils Relative %: 34 %
Platelets: 44 10*3/uL — ABNORMAL LOW (ref 150–400)
RBC: 2.94 MIL/uL — ABNORMAL LOW (ref 3.87–5.11)
RDW: 21.3 % — ABNORMAL HIGH (ref 11.5–15.5)
WBC: 7.2 10*3/uL (ref 4.0–10.5)
nRBC: 0.7 % — ABNORMAL HIGH (ref 0.0–0.2)

## 2023-11-05 LAB — I-STAT CG4 LACTIC ACID, ED
Lactic Acid, Venous: 2.2 mmol/L (ref 0.5–1.9)
Lactic Acid, Venous: 1.2 mmol/L (ref 0.5–1.9)

## 2023-11-05 LAB — CBG MONITORING, ED
Glucose-Capillary: 146 mg/dL — ABNORMAL HIGH (ref 70–99)
Glucose-Capillary: 122 mg/dL — ABNORMAL HIGH (ref 70–99)

## 2023-11-05 LAB — MAGNESIUM: Magnesium: 1.7 mg/dL (ref 1.7–2.4)

## 2023-11-05 LAB — TROPONIN I (HIGH SENSITIVITY)
Troponin I (High Sensitivity): 13 ng/L (ref <18)
Troponin I (High Sensitivity): 15 ng/L (ref <18)

## 2023-11-05 LAB — PROTIME-INR
INR: 1.1 (ref 0.8–1.2)
Prothrombin Time: 14 s (ref 11.4–15.2)

## 2023-11-05 LAB — TSH: TSH: 23.158 u[IU]/mL — ABNORMAL HIGH (ref 0.350–4.500)

## 2023-11-05 MED ORDER — LACTATED RINGERS IV BOLUS (SEPSIS)
1000.0000 mL | Freq: Once | INTRAVENOUS | Status: AC
  Administered 2023-11-05: 1000 mL via INTRAVENOUS

## 2023-11-05 MED ORDER — SODIUM CHLORIDE 0.9 % IV SOLN: INTRAVENOUS | Status: DC

## 2023-11-05 MED ORDER — HEPARIN SODIUM (PORCINE) 5000 UNIT/ML IJ SOLN
5000.0000 [IU] | Freq: Three times a day (TID) | INTRAMUSCULAR | Status: DC
  Administered 2023-11-06 – 2023-11-08 (×7): 5000 [IU] via SUBCUTANEOUS
  Filled 2023-11-05 (×7): qty 1

## 2023-11-05 MED ORDER — FAMOTIDINE IN NACL 20-0.9 MG/50ML-% IV SOLN
20.0000 mg | INTRAVENOUS | Status: DC
  Administered 2023-11-05 – 2023-11-06 (×2): 20 mg via INTRAVENOUS
  Filled 2023-11-05 (×2): qty 50

## 2023-11-05 MED ORDER — LACTATED RINGERS IV BOLUS (SEPSIS)
1000.0000 mL | Freq: Once | INTRAVENOUS | Status: AC
Start: 2023-11-05 — End: 2023-11-05
  Administered 2023-11-05: 1000 mL via INTRAVENOUS

## 2023-11-05 MED ORDER — SODIUM CHLORIDE 0.9 % IV SOLN
500.0000 mg | Freq: Two times a day (BID) | INTRAVENOUS | Status: DC
  Administered 2023-11-06 (×3): 500 mg via INTRAVENOUS
  Filled 2023-11-05 (×5): qty 10

## 2023-11-05 MED ORDER — ONDANSETRON HCL 4 MG/2ML IJ SOLN
4.0000 mg | Freq: Four times a day (QID) | INTRAMUSCULAR | Status: DC | PRN
  Administered 2023-11-07: 4 mg via INTRAVENOUS
  Filled 2023-11-05: qty 2

## 2023-11-05 MED ORDER — SODIUM CHLORIDE 0.9 % IV SOLN
1.0000 g | Freq: Once | INTRAVENOUS | Status: DC
  Filled 2023-11-05: qty 20

## 2023-11-05 MED ORDER — VANCOMYCIN HCL 750 MG/150ML IV SOLN: 750.0000 mg | INTRAVENOUS | Status: DC

## 2023-11-05 MED ORDER — LEVETIRACETAM IN NACL 1000 MG/100ML IV SOLN
1000.0000 mg | Freq: Two times a day (BID) | INTRAVENOUS | Status: AC
  Administered 2023-11-05 – 2023-11-08 (×6): 1000 mg via INTRAVENOUS
  Filled 2023-11-05 (×6): qty 100

## 2023-11-05 MED ORDER — NOREPINEPHRINE 4 MG/250ML-% IV SOLN
0.0000 ug/min | INTRAVENOUS | Status: DC
  Administered 2023-11-05: 2 ug/min via INTRAVENOUS
  Administered 2023-11-06 (×2): 12 ug/min via INTRAVENOUS
  Administered 2023-11-06: 6 ug/min via INTRAVENOUS
  Administered 2023-11-07: 3 ug/min via INTRAVENOUS
  Administered 2023-11-07: 10 ug/min via INTRAVENOUS
  Filled 2023-11-05 (×5): qty 250

## 2023-11-05 MED ORDER — VANCOMYCIN HCL 2000 MG/400ML IV SOLN
2000.0000 mg | Freq: Once | INTRAVENOUS | Status: AC
  Administered 2023-11-05: 2000 mg via INTRAVENOUS
  Filled 2023-11-05: qty 400

## 2023-11-05 MED ORDER — INSULIN ASPART 100 UNIT/ML IJ SOLN
0.0000 [IU] | INTRAMUSCULAR | Status: DC
  Administered 2023-11-06: 1 [IU] via SUBCUTANEOUS
  Administered 2023-11-06: 3 [IU] via SUBCUTANEOUS
  Administered 2023-11-06 (×2): 2 [IU] via SUBCUTANEOUS
  Administered 2023-11-07: 5 [IU] via SUBCUTANEOUS
  Administered 2023-11-07 (×2): 3 [IU] via SUBCUTANEOUS
  Administered 2023-11-07: 2 [IU] via SUBCUTANEOUS
  Administered 2023-11-07: 5 [IU] via SUBCUTANEOUS
  Administered 2023-11-08: 2 [IU] via SUBCUTANEOUS
  Administered 2023-11-08: 5 [IU] via SUBCUTANEOUS
  Administered 2023-11-08 (×2): 3 [IU] via SUBCUTANEOUS
  Administered 2023-11-08: 5 [IU] via SUBCUTANEOUS
  Administered 2023-11-08: 3 [IU] via SUBCUTANEOUS
  Administered 2023-11-08: 2 [IU] via SUBCUTANEOUS
  Administered 2023-11-09 (×2): 3 [IU] via SUBCUTANEOUS
  Administered 2023-11-09: 2 [IU] via SUBCUTANEOUS
  Administered 2023-11-09: 3 [IU] via SUBCUTANEOUS
  Administered 2023-11-09: 2 [IU] via SUBCUTANEOUS
  Administered 2023-11-10 (×2): 3 [IU] via SUBCUTANEOUS
  Administered 2023-11-10 (×2): 2 [IU] via SUBCUTANEOUS
  Administered 2023-11-11: 3 [IU] via SUBCUTANEOUS
  Administered 2023-11-11: 2 [IU] via SUBCUTANEOUS
  Administered 2023-11-11: 3 [IU] via SUBCUTANEOUS
  Administered 2023-11-11: 5 [IU] via SUBCUTANEOUS
  Administered 2023-11-11: 2 [IU] via SUBCUTANEOUS
  Administered 2023-11-12: 5 [IU] via SUBCUTANEOUS
  Administered 2023-11-12: 3 [IU] via SUBCUTANEOUS
  Administered 2023-11-13: 5 [IU] via SUBCUTANEOUS
  Administered 2023-11-13 (×2): 3 [IU] via SUBCUTANEOUS
  Administered 2023-11-13 (×2): 2 [IU] via SUBCUTANEOUS
  Administered 2023-11-14: 5 [IU] via SUBCUTANEOUS
  Administered 2023-11-14 (×2): 3 [IU] via SUBCUTANEOUS
  Administered 2023-11-14: 5 [IU] via SUBCUTANEOUS
  Administered 2023-11-15: 3 [IU] via SUBCUTANEOUS
  Administered 2023-11-15: 5 [IU] via SUBCUTANEOUS
  Administered 2023-11-15: 3 [IU] via SUBCUTANEOUS
  Administered 2023-11-15: 2 [IU] via SUBCUTANEOUS
  Administered 2023-11-15 – 2023-11-16 (×4): 3 [IU] via SUBCUTANEOUS
  Administered 2023-11-16: 2 [IU] via SUBCUTANEOUS
  Administered 2023-11-16 (×2): 3 [IU] via SUBCUTANEOUS
  Administered 2023-11-17: 2 [IU] via SUBCUTANEOUS
  Administered 2023-11-17 (×3): 3 [IU] via SUBCUTANEOUS
  Administered 2023-11-17: 2 [IU] via SUBCUTANEOUS
  Administered 2023-11-18 (×3): 3 [IU] via SUBCUTANEOUS
  Administered 2023-11-18: 5 [IU] via SUBCUTANEOUS
  Administered 2023-11-18 – 2023-11-19 (×3): 3 [IU] via SUBCUTANEOUS
  Administered 2023-11-19: 8 [IU] via SUBCUTANEOUS
  Administered 2023-11-19 (×3): 5 [IU] via SUBCUTANEOUS
  Administered 2023-11-20 (×2): 3 [IU] via SUBCUTANEOUS
  Administered 2023-11-20: 5 [IU] via SUBCUTANEOUS
  Administered 2023-11-20: 8 [IU] via SUBCUTANEOUS
  Administered 2023-11-20: 2 [IU] via SUBCUTANEOUS
  Administered 2023-11-20: 3 [IU] via SUBCUTANEOUS
  Administered 2023-11-21: 8 [IU] via SUBCUTANEOUS
  Administered 2023-11-21: 5 [IU] via SUBCUTANEOUS
  Administered 2023-11-21 (×2): 3 [IU] via SUBCUTANEOUS
  Administered 2023-11-21: 5 [IU] via SUBCUTANEOUS
  Administered 2023-11-22: 2 [IU] via SUBCUTANEOUS
  Administered 2023-11-22 (×2): 5 [IU] via SUBCUTANEOUS

## 2023-11-05 NOTE — H&P (Signed)
 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  She was d/c to facility 4/14 from [facility] after being treated for sepsis, UTI and she had a stage III sacral decubiti.  She has a chronic Foley. On this admission she was noted to not want to eat or drink or take her meds.  She is bed bound and she is minimally responsive but does eat.  According to husband who was at the beside, she has been refusing her food and meds for several weeks now and as a result was sent to ED.  While in the ED her BP dropped necessitating IV pressors.  Her LA was 2.2 and improved to 1.2 with IV fluids.  Her Cr 3.20 when it was 0.74 on 4/9. Husband at bedside wants full code. Pertinent  Medical History  Anemia, Arthritis, Claustrophobia, Diabetes mellitus without complication (HCC), Diverticulosis, Fibrocystic breast disease (FCBD), Fibrocystic disease of breast, GERD (gastroesophageal reflux disease), Herpes simplex, HLD (hyperlipidemia), Hypercholesteremia, Hyperlipidemia, Hypertension, Hypothyroidism, Pre-diabetes, Pulmonary nodules, Pure hypercholesterolemia, Rotator cuff tear, Seizures (HCC), Sleep apnea, Subacute delirium (10/20/2023), Vitamin D  deficiency, and Vitamin D  deficiency.   Significant Hospital Events: Including procedures, antibiotic start and stop dates in addition to other pertinent events   4/24: admit to ICU for sepsis  Interim History / Subjective:  N/a  Objective   Blood pressure 103/88, pulse 70, temperature 97.9 F  (36.6 C), temperature source Oral, resp. rate (!) 24, height 5\' 7"  (1.702 m), weight 97.5 kg, SpO2 100%.        Intake/Output Summary (Last 24 hours) at 11/05/2023 2155 Last data filed at 11/05/2023 2104 Gross per 24 hour  Intake 1020.82 ml  Output --  Net 1020.82 ml   Filed Weights   11/05/23 1606  Weight: 97.5 kg    Examination: General: opens eyes to name calling but then falls back to sleep, apnea episodes noted HENT: PERRLA no icterus, very dry mm Lungs: CTA b/l no wheezes rales or rhonchi Cardiovascular: Reg s1s2 no murmurs or gallops Abdomen: soft obese nt nd bs pos Extremities: no cyanosis, clubbing or edema Neuro: arousalable but very lethargic, moving all extremities spontaneously but not following commands GU: chronic Foley  Resolved Hospital Problem list   N/a  Assessment & Plan:  Sepsis  -Likely from UTI from chronic Foley  -Wound care consult for Stage III decubitus  -Board spectrum antibiotics  -IV fluids and pressors, currently on 5mcg Levophed  Hypotension  -Levophed  and IV Fluids UTI  -Chronic Foley, changed on ED  -Broad spectrum antibiotics AKI  -Likely from sepsis, dehydration from poor oral intake Hyperkalemia  -secondary to acidosis and AKI  -Lokelma to be added Acute respiratory failure  -having apnea events and given ehr poor mentation, will likely need intubation for airways protection H/o Seizures   -Continue her on her seizure medications  -seizure precautions  Best Practice (right click and "Reselect all SmartList Selections" daily)   Diet/type: NPO w/ meds via tube DVT prophylaxis prophylactic heparin   Pressure ulcer(s): present on admission  GI prophylaxis: H2B  Lines: N/A Foley:  Yes, and it is still needed Code Status:  full code   Labs   CBC: Recent Labs  Lab 11/05/23 1633 11/05/23 1640 11/05/23 1641  WBC 7.2  --   --   NEUTROABS 2.5  --   --   HGB 10.2* 9.5* 10.2*  HCT 32.3* 28.0* 30.0*  MCV 109.9*  --   --    PLT 44*  --   --     Basic Metabolic Panel: Recent Labs  Lab 11/05/23 1633 11/05/23 1640 11/05/23 1641  NA 141 141 141  K 5.1 6.3* 6.3*  CL 108  --  111  CO2 19*  --   --   GLUCOSE 135*  --  134*  BUN 57*  --  75*  CREATININE 3.03*  --  3.20*  CALCIUM  8.6*  --   --   MG 1.7  --   --    GFR: Estimated Creatinine Clearance: 18.5 mL/min (A) (by C-G formula based on SCr of 3.2 mg/dL (H)). Recent Labs  Lab 11/05/23 1633 11/05/23 1639 11/05/23 1844  WBC 7.2  --   --   LATICACIDVEN  --  2.2* 1.2    Liver Function Tests: Recent Labs  Lab 11/05/23 1633  AST 81*  ALT 47*  ALKPHOS 113  BILITOT 1.2  PROT 7.0  ALBUMIN 2.8*   No results for input(s): "LIPASE", "AMYLASE" in the last 168 hours. No results for input(s): "AMMONIA" in the last 168 hours.  ABG    Component Value Date/Time   HCO3 22.4 11/05/2023 1640   TCO2 22 11/05/2023 1641   ACIDBASEDEF 1.0 11/05/2023 1640   O2SAT 92 11/05/2023 1640     Coagulation Profile: Recent Labs  Lab 11/05/23 1633  INR 1.1    Cardiac Enzymes: No results for input(s): "CKTOTAL", "CKMB", "CKMBINDEX", "TROPONINI" in the last 168 hours.  HbA1C: Hgb A1c MFr Bld  Date/Time Value Ref Range Status  08/07/2023 12:17 AM 6.7 (H) 4.8 - 5.6 % Final    Comment:    (NOTE) Pre diabetes:          5.7%-6.4%  Diabetes:              >6.4%  Glycemic control for   <7.0% adults with diabetes   09/09/2017 08:39 AM 6.5 (H) 4.8 - 5.6 % Final    Comment:    (NOTE) Pre diabetes:          5.7%-6.4% Diabetes:              >6.4% Glycemic control for   <7.0% adults with diabetes     CBG: Recent Labs  Lab 11/05/23 1611  GLUCAP 146*    Review of Systems:   Unable to obtain, AMS  Past Medical History:  She,  has a past medical history of Anemia, Arthritis, Claustrophobia, Diabetes mellitus without complication (HCC), Diverticulosis, Fibrocystic breast disease (FCBD), Fibrocystic disease of breast, GERD (gastroesophageal reflux  disease), Herpes simplex, HLD (hyperlipidemia), Hypercholesteremia, Hyperlipidemia, Hypertension, Hypothyroidism, Pre-diabetes, Pulmonary nodules, Pure hypercholesterolemia, Rotator cuff tear, Seizures (HCC), Sleep apnea, Subacute delirium (10/20/2023), Vitamin D  deficiency, and Vitamin D  deficiency.   Surgical History:   Past Surgical History:  Procedure Laterality Date   ABDOMINAL HYSTERECTOMY     BREAST BIOPSY Left 06/19/2020   stereo biopsy/ x clip/ path pending   BREAST EXCISIONAL BIOPSY Left 70/80s   benign   CHOLECYSTECTOMY     COLONOSCOPY WITH PROPOFOL  N/A 03/22/2015   Procedure: COLONOSCOPY WITH PROPOFOL ;  Surgeon: Stephens Eis, MD;  Location: [facility] ENDOSCOPY;  Service: Gastroenterology;  Laterality: N/A;   COLONOSCOPY WITH PROPOFOL  N/A 04/19/2021   Procedure: COLONOSCOPY WITH PROPOFOL ;  Surgeon: Shane Darling, MD;  Location: [facility] ENDOSCOPY;  Service: Endoscopy;  Laterality: N/A;  AMPICILLIN - PER OFFICE   DG FOOT HEEL ([facility] HX)     DILATION AND CURETTAGE OF UTERUS     ESOPHAGOGASTRODUODENOSCOPY (EGD) WITH PROPOFOL  N/A 03/22/2015   Procedure: ESOPHAGOGASTRODUODENOSCOPY (EGD) WITH PROPOFOL ;  Surgeon: Stephens Eis, MD;  Location: [facility] ENDOSCOPY;  Service: Gastroenterology;  Laterality: N/A;   JOINT REPLACEMENT     KNEE ARTHROPLASTY Right 09/23/2017   Procedure: COMPUTER ASSISTED TOTAL KNEE ARTHROPLASTY;  Surgeon: Arlyne Lame, MD;  Location: [facility] ORS;  Service: Orthopedics;  Laterality: Right;   KNEE ARTHROSCOPY Right 08/02/2015   Procedure: ARTHROSCOPY KNEE, PARTIAL MEDIAL MENISECTOMY, PLICA INCISION;  Surgeon: Molli Angelucci, MD;  Location: [facility] ORS;  Service: Orthopedics;  Laterality: Right;   KNEE ARTHROSCOPY       Social History:   reports that she has never smoked. She has never used smokeless tobacco. She reports that she does not drink alcohol and does not use drugs.   Family History:  Her family history is negative for Breast cancer.   Allergies Allergies  Allergen  Reactions   Celebrex [Celecoxib] Nausea Only   Codeine Nausea Only     Home Medications  Prior to Admission medications   Medication Sig Start Date End Date Taking? Authorizing Provider  Amino Acids-Protein Hydrolys (FEEDING SUPPLEMENT, PRO-STAT SUGAR FREE 64,) LIQD Take 30 mLs by mouth 2 (two) times daily.   Yes [provider]  bisoprolol  (ZEBETA ) 10 MG tablet Take 10 mg by mouth daily.   Yes [provider]  cetirizine (ZYRTEC) 10 MG tablet Take 10 mg by mouth daily.   Yes [provider]  Cholecalciferol  25 MCG (1000 UT) tablet Take 1,000 Units by mouth daily.   Yes [provider]  cloBAZam  (ONFI ) 10 MG tablet Take 1 tablet (10 mg total) by mouth 2 (two) times daily. 10/26/23  Yes Cleven Dallas, DO  cyanocobalamin  1000 MCG tablet Take 1 tablet (1,000 mcg total) by mouth daily. 08/13/23  Yes Armenta Landau, MD  dexamethasone  (DECADRON ) 1.5 MG tablet Take 2 tablets (3 mg total) by mouth daily. 10/26/23  Yes Jayson Michael, MD  lacosamide  (VIMPAT ) 200 MG TABS tablet Take 1 tablet (200 mg total) by mouth 2 (two) times daily. 08/12/23  Yes Armenta Landau, MD  levETIRAcetam  (KEPPRA ) 750 MG tablet Take 2 tablets (1,500 mg total) by mouth 2 (two) times daily. 08/12/23  Yes Armenta Landau, MD  levothyroxine  (SYNTHROID ) 137 MCG tablet Take 137 mcg by mouth daily before breakfast.   Yes [provider]  LORazepam  (ATIVAN ) 1 MG tablet Take 1 tablet (1 mg total) by mouth daily as needed for anxiety. 10/26/23  Yes Cleven Dallas, DO  lovastatin (MEVACOR) 40 MG tablet Take 40 mg by mouth at bedtime.   Yes [provider]  metroNIDAZOLE  (FLAGYL ) 500 MG tablet Take 500 mg by mouth See admin instructions. Crush one tablet and apply to sacral would daily with wound care for wound infection   Yes [provider]  mirtazapine (REMERON) 7.5 MG tablet Take 7.5 mg by mouth at bedtime.   Yes [provider]  Multiple Vitamin  (MULTIVITAMIN WITH MINERALS) TABS tablet Take 1 tablet by mouth daily. Centrum Silver for Women   Yes [provider]  polyethylene glycol (MIRALAX  / GLYCOLAX ) 17 g packet Take 17 g by mouth daily. 10/27/23  Yes Jayson Michael, MD  senna-docusate (SENOKOT-S) 8.6-50 MG tablet Take 1 tablet by mouth at bedtime. 10/26/23  Yes Jayson Michael, MD  Calcium  Carbonate-Vitamin D  (CALCIUM  600+D PO) Take 1 tablet by mouth daily. Patient not taking: Reported on 11/05/2023    [provider]  lisinopril  (PRINIVIL ,ZESTRIL ) 40 MG tablet Take 40 mg by mouth at bedtime.     [provider]  omeprazole (PRILOSEC) 20 MG capsule Take 20 mg by mouth daily before breakfast.  Patient not taking: Reported on 11/05/2023    [provider]  predniSONE  (DELTASONE ) 20 MG tablet Take 20 mg by mouth daily with breakfast. 10/19/23   [provider]  sulfamethoxazole-trimethoprim (BACTRIM DS) 800-160 MG tablet Take 1 tablet by mouth 2 (two) times daily. Patient not taking: Reported on 11/05/2023    [provider]     Critical care time: 35   The patient is critically ill with multiple organ system failure and requires high complexity decision making for assessment and support, frequent evaluation and titration of therapies, advanced monitoring, review of radiographic studies and interpretation of complex data.   Critical Care Time devoted to patient care services, exclusive of separately billable procedures, described in this note is 39 minutes.  Claven Cumming, MD [facility] See Amion for pager  If no response to pager , please call [phone] until 7pm After 7:00 pm call Elink  [phone] 11/05/2023, 9:55 PM

## 2023-11-05 NOTE — ED Triage Notes (Signed)
 Pt arrives via EMS from [facility] with decline over past 2 weeks that worsened today. Pt opens eyes to name. Hx of seizures and recent admission.

## 2023-11-05 NOTE — Progress Notes (Addendum)
 Pharmacy Antibiotic Note  Dana Morton is a 73 y.o. female admitted on 11/05/2023 s/p fall/AMS with concerns for sepsis. Pharmacy has been consulted for vancomycin  dosing.   -WBC 7, sCr 3.2 (bl~1), afebrile -Blood and urine cultures collected  -4/8 Ucx: Kleb Pneumo + PSA (drawn prior to foley cath exchange at last hospitalization)   Plan: -Merrem  500mg  IV every 12 hours  -Vancomycin  2g IV x1 -Vancomycin  750mg  IV every 48 hours (AUC 456, Vd 0.5, IBW, sCr 3.2) -Monitor renal function -Follow up signs of clinical improvement, LOT, de-escalation of antibiotics   Height: 5\' 7"  (170.2 cm) Weight: 97.5 kg (214 lb 15.2 oz) IBW/kg (Calculated) : 61.6  Temp (24hrs), Avg:97.7 F (36.5 C), Min:97.4 F (36.3 C), Max:97.9 F (36.6 C)  Recent Labs  Lab 11/05/23 1633 11/05/23 1639 11/05/23 1641 11/05/23 1844  WBC 7.2  --   --   --   CREATININE 3.03*  --  3.20*  --   LATICACIDVEN  --  2.2*  --  1.2    Estimated Creatinine Clearance: 18.5 mL/min (A) (by C-G formula based on SCr of 3.2 mg/dL (H)).    Allergies  Allergen Reactions   Celebrex [Celecoxib] Nausea Only   Codeine Nausea Only    Antimicrobials this admission: Vancomycin  4/24 >>  Merrem  4/24 >>   Microbiology results: 4/24 BCx:  4/24 UCx:   Thank you for allowing pharmacy to be a part of this patient's care.  Young Hensen, PharmD. Clinical Pharmacist 11/05/2023 10:12 PM

## 2023-11-05 NOTE — ED Provider Notes (Signed)
 [place] EMERGENCY DEPARTMENT AT [facility] Provider Note   CSN: [id number] Arrival date & time: 11/05/23  1557     History {Add pertinent medical, surgical, social history, OB history to HPI:1} Chief Complaint  Patient presents with   Altered Mental Status    Dana Morton is a 73 y.o. female.  She is brought in from her facility for decline over the last few weeks since admission.  She was admitted here for altered mental status after a fall.  She has a history of glioblastoma status postresection wound with with residual deficits.  She was discharged with a Foley due to healing sacral wound.  Has a history of seizure disorder.  Husband states she has not been eating or drinking much and has not been taking her meds for 6 weeks plus.  She has declined since going to rehab.  He said she can minimally carry on a conversation although can use her arms and legs.  He has not seen her do that at rehab recently though.  She is a full code at this time.  Level 5 caveat secondary to unresponsive  The history is provided by the EMS personnel and the spouse.  Altered Mental Status Presenting symptoms: unresponsiveness   Timing:  Constant Progression:  Worsening Context: recent infection        Home Medications Prior to Admission medications   Medication Sig Start Date End Date Taking? Authorizing Provider  bisoprolol  (ZEBETA ) 10 MG tablet Take 10 mg by mouth daily.    [provider]  Calcium  Carbonate-Vitamin D  (CALCIUM  600+D PO) Take 1 tablet by mouth daily.    [provider]  cetirizine (ZYRTEC) 10 MG tablet Take 10 mg by mouth daily.    [provider]  Cholecalciferol  25 MCG (1000 UT) tablet Take 1,000 Units by mouth daily.    [provider]  cloBAZam  (ONFI ) 10 MG tablet Take 1 tablet (10 mg total) by mouth 2 (two) times daily. 10/26/23   Cleven Dallas, DO  cyanocobalamin  1000 MCG tablet Take 1 tablet (1,000 mcg total) by mouth  daily. 08/13/23   Armenta Landau, MD  dexamethasone  (DECADRON ) 1.5 MG tablet Take 2 tablets (3 mg total) by mouth daily. 10/26/23   Jayson Erich Kochan, MD  lacosamide  (VIMPAT ) 200 MG TABS tablet Take 1 tablet (200 mg total) by mouth 2 (two) times daily. 08/12/23   Armenta Landau, MD  levETIRAcetam  (KEPPRA ) 750 MG tablet Take 2 tablets (1,500 mg total) by mouth 2 (two) times daily. 08/12/23   Armenta Landau, MD  levothyroxine  (SYNTHROID ) 137 MCG tablet Take 137 mcg by mouth daily before breakfast.    [provider]  lisinopril  (PRINIVIL ,ZESTRIL ) 40 MG tablet Take 40 mg by mouth at bedtime.     [provider]  LORazepam  (ATIVAN ) 1 MG tablet Take 1 tablet (1 mg total) by mouth daily as needed for anxiety. 10/26/23   Cleven Dallas, DO  lovastatin (MEVACOR) 40 MG tablet Take 40 mg by mouth at bedtime.    [provider]  mirtazapine (REMERON) 7.5 MG tablet Take 7.5 mg by mouth at bedtime.    [provider]  Multiple Vitamin (MULTIVITAMIN WITH MINERALS) TABS tablet Take 1 tablet by mouth daily. Centrum Silver for Women    [provider]  omeprazole (PRILOSEC) 20 MG capsule Take 20 mg by mouth daily before breakfast.     [provider]  polyethylene glycol (MIRALAX  / GLYCOLAX ) 17 g packet Take 17  g by mouth daily. 10/27/23   Jayson Mikyle Sox, MD  predniSONE  (DELTASONE ) 20 MG tablet Take 20 mg by mouth daily with breakfast. 10/19/23   [provider]  senna-docusate (SENOKOT-S) 8.6-50 MG tablet Take 1 tablet by mouth at bedtime. 10/26/23   Jayson Luanne Krzyzanowski, MD      Allergies    Celebrex [celecoxib] and Codeine    Review of Systems   Review of Systems  Unable to perform ROS: Patient unresponsive    Physical Exam Updated Vital Signs BP (!) 85/47   Pulse 71   Temp (!) 97.4 F (36.3 C) (Axillary)   Resp 18   Ht 5\' 7"  (1.702 m)   Wt 97.5 kg   SpO2 100%   BMI 33.67 kg/m  Physical Exam Vitals and nursing note reviewed.  Constitutional:       General: She is not in acute distress.    Appearance: Normal appearance. She is well-developed. She is obese.  HENT:     Head: Normocephalic and atraumatic.  Eyes:     Conjunctiva/sclera: Conjunctivae normal.  Cardiovascular:     Rate and Rhythm: Normal rate and regular rhythm.     Heart sounds: No murmur heard. Pulmonary:     Effort: Pulmonary effort is normal. No respiratory distress.     Breath sounds: Normal breath sounds. No stridor. No wheezing.  Abdominal:     Palpations: Abdomen is soft.     Tenderness: There is no abdominal tenderness. There is no guarding or rebound.  Genitourinary:    Comments: Foley catheter in place Musculoskeletal:        General: No tenderness or deformity. Normal range of motion.     Cervical back: Neck supple.     Comments: PICC line right upper extremity without surrounding erythema  Skin:    General: Skin is warm and dry.     Comments: She has a large area of sacral breakdown with a little bit of eschar  Neurological:     GCS: GCS eye subscore is 4. GCS verbal subscore is 5. GCS motor subscore is 6.     Comments: She is arousable to voice but will just mumble.  She is not following commands and not moving her upper or lower extremities.     ED Results / Procedures / Treatments   Labs (all labs ordered are listed, but only abnormal results are displayed) Labs Reviewed  CBG MONITORING, ED - Abnormal; Notable for the following components:      Result Value   Glucose-Capillary 146 (*)    All other components within normal limits    EKG None  Radiology No results found.  Procedures Procedures  {Document cardiac monitor, telemetry assessment procedure when appropriate:1}  Medications Ordered in ED Medications  lactated ringers  bolus 1,000 mL (has no administration in time range)    ED Course/ Medical Decision Making/ A&P   {   Click here for ABCD2, HEART and other calculatorsREFRESH Note before signing :1}                               Medical Decision Making Amount and/or Complexity of Data Reviewed Labs: ordered. Radiology: ordered.   This patient complains of ***; this involves an extensive number of treatment Options and is a complaint that carries with it a high risk of complications and morbidity. The differential includes ***  I ordered, reviewed and interpreted labs, which included *** I ordered medication ***  and reviewed PMP when indicated. I ordered imaging studies which included *** and I independently    visualized and interpreted imaging which showed *** Additional history obtained from *** Previous records obtained and reviewed *** I consulted *** and discussed lab and imaging findings and discussed disposition.  Cardiac monitoring reviewed, *** Social determinants considered, *** Critical Interventions: ***  After the interventions stated above, I reevaluated the patient and found *** Admission and further testing considered, ***   {Document critical care time when appropriate:1} {Document review of labs and clinical decision tools ie heart score, Chads2Vasc2 etc:1}  {Document your independent review of radiology images, and any outside records:1} {Document your discussion with family members, caretakers, and with consultants:1} {Document social determinants of health affecting pt's care:1} {Document your decision making why or why not admission, treatments were needed:1} Final Clinical Impression(s) / ED Diagnoses Final diagnoses:  None    Rx / DC Orders ED Discharge Orders     None

## 2023-11-05 NOTE — ED Notes (Signed)
 Patient transported to CT with RN on the monitor

## 2023-11-06 ENCOUNTER — Inpatient Hospital Stay (HOSPITAL_COMMUNITY)

## 2023-11-06 DIAGNOSIS — A419 Sepsis, unspecified organism: Secondary | ICD-10-CM

## 2023-11-06 DIAGNOSIS — J9600 Acute respiratory failure, unspecified whether with hypoxia or hypercapnia: Secondary | ICD-10-CM | POA: Diagnosis not present

## 2023-11-06 DIAGNOSIS — N179 Acute kidney failure, unspecified: Secondary | ICD-10-CM

## 2023-11-06 DIAGNOSIS — N390 Urinary tract infection, site not specified: Secondary | ICD-10-CM

## 2023-11-06 LAB — POCT I-STAT 7, (LYTES, BLD GAS, ICA,H+H)
Acid-base deficit: 5 mmol/L — ABNORMAL HIGH (ref 0.0–2.0)
Bicarbonate: 19.6 mmol/L — ABNORMAL LOW (ref 20.0–28.0)
Calcium, Ion: 1.2 mmol/L (ref 1.15–1.40)
HCT: 25 % — ABNORMAL LOW (ref 36.0–46.0)
Hemoglobin: 8.5 g/dL — ABNORMAL LOW (ref 12.0–15.0)
O2 Saturation: 96 %
Patient temperature: 97.7
Potassium: 3.8 mmol/L (ref 3.5–5.1)
Sodium: 142 mmol/L (ref 135–145)
TCO2: 21 mmol/L — ABNORMAL LOW (ref 22–32)
pCO2 arterial: 32.7 mmHg (ref 32–48)
pH, Arterial: 7.382 (ref 7.35–7.45)
pO2, Arterial: 80 mmHg — ABNORMAL LOW (ref 83–108)

## 2023-11-06 LAB — CBC
HCT: 28.4 % — ABNORMAL LOW (ref 36.0–46.0)
Hemoglobin: 9.1 g/dL — ABNORMAL LOW (ref 12.0–15.0)
MCH: 34 pg (ref 26.0–34.0)
MCHC: 32 g/dL (ref 30.0–36.0)
MCV: 106 fL — ABNORMAL HIGH (ref 80.0–100.0)
Platelets: 40 10*3/uL — ABNORMAL LOW (ref 150–400)
RBC: 2.68 MIL/uL — ABNORMAL LOW (ref 3.87–5.11)
RDW: 21.3 % — ABNORMAL HIGH (ref 11.5–15.5)
WBC: 7.5 10*3/uL (ref 4.0–10.5)
nRBC: 0.4 % — ABNORMAL HIGH (ref 0.0–0.2)

## 2023-11-06 LAB — GLUCOSE, CAPILLARY
Glucose-Capillary: 137 mg/dL — ABNORMAL HIGH (ref 70–99)
Glucose-Capillary: 102 mg/dL — ABNORMAL HIGH (ref 70–99)
Glucose-Capillary: 134 mg/dL — ABNORMAL HIGH (ref 70–99)
Glucose-Capillary: 129 mg/dL — ABNORMAL HIGH (ref 70–99)
Glucose-Capillary: 160 mg/dL — ABNORMAL HIGH (ref 70–99)

## 2023-11-06 LAB — BASIC METABOLIC PANEL WITH GFR
Anion gap: 12 (ref 5–15)
BUN: 47 mg/dL — ABNORMAL HIGH (ref 8–23)
CO2: 21 mmol/L — ABNORMAL LOW (ref 22–32)
Calcium: 8 mg/dL — ABNORMAL LOW (ref 8.9–10.3)
Chloride: 107 mmol/L (ref 98–111)
Creatinine, Ser: 2.17 mg/dL — ABNORMAL HIGH (ref 0.44–1.00)
GFR, Estimated: 23 mL/min — ABNORMAL LOW (ref >60)
Glucose, Bld: 141 mg/dL — ABNORMAL HIGH (ref 70–99)
Potassium: 3.4 mmol/L — ABNORMAL LOW (ref 3.5–5.1)
Sodium: 140 mmol/L (ref 135–145)

## 2023-11-06 LAB — PHOSPHORUS: Phosphorus: 4 mg/dL (ref 2.5–4.6)

## 2023-11-06 LAB — CBG MONITORING, ED: Glucose-Capillary: 125 mg/dL — ABNORMAL HIGH (ref 70–99)

## 2023-11-06 LAB — MRSA NEXT GEN BY PCR, NASAL: MRSA by PCR Next Gen: NOT DETECTED

## 2023-11-06 LAB — MAGNESIUM: Magnesium: 1.4 mg/dL — ABNORMAL LOW (ref 1.7–2.4)

## 2023-11-06 MED ORDER — JUVEN PO PACK
1.0000 | PACK | Freq: Two times a day (BID) | ORAL | Status: DC
  Administered 2023-11-07 – 2023-11-20 (×26): 1
  Filled 2023-11-06 (×26): qty 1

## 2023-11-06 MED ORDER — LACTATED RINGERS IV BOLUS
500.0000 mL | Freq: Once | INTRAVENOUS | Status: AC
  Administered 2023-11-06: 500 mL via INTRAVENOUS

## 2023-11-06 MED ORDER — ATROPINE SULFATE 1 MG/10ML IJ SOSY
PREFILLED_SYRINGE | INTRAMUSCULAR | Status: AC
  Filled 2023-11-06: qty 10

## 2023-11-06 MED ORDER — POTASSIUM CHLORIDE 10 MEQ/100ML IV SOLN
10.0000 meq | INTRAVENOUS | Status: AC
  Administered 2023-11-06 (×2): 10 meq via INTRAVENOUS
  Filled 2023-11-06 (×2): qty 100

## 2023-11-06 MED ORDER — OSMOLITE 1.5 CAL PO LIQD
1000.0000 mL | ORAL | Status: DC
  Administered 2023-11-06 – 2023-11-09 (×4): 1000 mL
  Filled 2023-11-06 (×5): qty 1000

## 2023-11-06 MED ORDER — MAGNESIUM SULFATE 2 GM/50ML IV SOLN
2.0000 g | Freq: Once | INTRAVENOUS | Status: AC
  Administered 2023-11-06: 2 g via INTRAVENOUS
  Filled 2023-11-06: qty 50

## 2023-11-06 MED ORDER — DEXAMETHASONE 2 MG PO TABS
3.0000 mg | ORAL_TABLET | Freq: Every day | ORAL | Status: DC
  Administered 2023-11-06 – 2023-11-22 (×17): 3 mg
  Filled 2023-11-06 (×17): qty 1.5

## 2023-11-06 MED ORDER — LACOSAMIDE 200 MG PO TABS
200.0000 mg | ORAL_TABLET | Freq: Two times a day (BID) | ORAL | Status: DC
  Administered 2023-11-06 – 2023-11-22 (×32): 200 mg
  Filled 2023-11-06 (×2): qty 1
  Filled 2023-11-06: qty 4
  Filled 2023-11-06 (×11): qty 1
  Filled 2023-11-06: qty 4
  Filled 2023-11-06 (×7): qty 1
  Filled 2023-11-06: qty 4
  Filled 2023-11-06 (×8): qty 1
  Filled 2023-11-06: qty 4

## 2023-11-06 MED ORDER — LACTATED RINGERS IV SOLN: INTRAVENOUS | Status: AC

## 2023-11-06 MED ORDER — PRAVASTATIN SODIUM 40 MG PO TABS
40.0000 mg | ORAL_TABLET | Freq: Every day | ORAL | Status: DC
  Administered 2023-11-06 – 2023-11-18 (×13): 40 mg
  Filled 2023-11-06 (×2): qty 1
  Filled 2023-11-06: qty 4
  Filled 2023-11-06 (×3): qty 1
  Filled 2023-11-06: qty 4
  Filled 2023-11-06 (×4): qty 1
  Filled 2023-11-06: qty 4
  Filled 2023-11-06: qty 1

## 2023-11-06 MED ORDER — SODIUM CHLORIDE 0.9 % IV SOLN
200.0000 mg | Freq: Once | INTRAVENOUS | Status: AC
  Administered 2023-11-06: 200 mg via INTRAVENOUS
  Filled 2023-11-06: qty 20

## 2023-11-06 MED ORDER — CLOBAZAM 10 MG PO TABS
10.0000 mg | ORAL_TABLET | Freq: Two times a day (BID) | ORAL | Status: DC
  Administered 2023-11-06 – 2023-11-22 (×33): 10 mg
  Filled 2023-11-06 (×33): qty 1

## 2023-11-06 MED ORDER — ADULT MULTIVITAMIN W/MINERALS CH
1.0000 | ORAL_TABLET | Freq: Every day | ORAL | Status: DC
  Administered 2023-11-06 – 2023-11-22 (×17): 1
  Filled 2023-11-06 (×17): qty 1

## 2023-11-06 MED ORDER — THIAMINE MONONITRATE 100 MG PO TABS
100.0000 mg | ORAL_TABLET | Freq: Every day | ORAL | Status: AC
  Administered 2023-11-06 – 2023-11-10 (×5): 100 mg
  Filled 2023-11-06 (×5): qty 1

## 2023-11-06 MED ORDER — LEVOTHYROXINE SODIUM 137 MCG PO TABS
137.0000 ug | ORAL_TABLET | Freq: Every day | ORAL | Status: DC
  Administered 2023-11-06 – 2023-11-22 (×17): 137 ug
  Filled 2023-11-06 (×17): qty 1

## 2023-11-06 MED ORDER — PROSOURCE TF20 ENFIT COMPATIBL EN LIQD
60.0000 mL | Freq: Every day | ENTERAL | Status: DC
  Administered 2023-11-06 – 2023-11-11 (×6): 60 mL
  Filled 2023-11-06 (×6): qty 60

## 2023-11-06 NOTE — Progress Notes (Signed)
 Initial Nutrition Assessment  DOCUMENTATION CODES:  Severe malnutrition in context of acute illness/injury  INTERVENTION:  Initiate tube feeding via cortrak tube: Osmolite 1.5 at 50 ml/h (1200 ml per day) Start at 20 and advance by 10mL q8h to goal Prosource TF20 60 ml 1x/d Provides 1880 kcal, 95 gm protein, 914 ml free water daily MVI with minerals 1 packet Juven BID, each packet provides 95 calories, 2.5 grams of protein (collagen), + micronutrients to support wound healing Monitor magnesium  and phosphorus every 12 hours x 4 occurrences, MD to replete as needed, as pt is at risk for refeeding syndrome given malnutrition. Thiamine  100mg  x 5 days  NUTRITION DIAGNOSIS:  Severe Malnutrition related to acute illness (recurrent hospitalizations and rehab stays) as evidenced by severe muscle depletion, moderate fat depletion, percent weight loss (10.5% x 3 months).  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   Pt resting in bed at the time of assessment. Undergoing EEG. Son at bedside able to provide a hx. Later returned to assist with cortrak placement and spoke with husband and daughter.   Per family, after pt's initial surgery, she underwent chemo and radiation. States that pt was doing pretty good, only mild side-effects from the treatment. However, do report that over the last few months, pt has been in and out of the hospitals after she initially developed seizures. Has been to several nursing facilities and has had a general decline.   Pt was still consuming small things and drinking ensure but over the last week or so intake has gotten very poor and she  has not been eating or drinking anything. Cortrak to be placed for medication and enteral nutrition discussed nutrition plan with RN, MD, and family at bedside.   On exam. Pt with severe weight loss and severe muscle loss, particularly to the lower body. Overall as it seems that this has occurred in the last few months, feel malnutrition is more driven but recurrent admission and acute illness brought on by a chronic illness.   Pt will be at risk for refeeding. Will initiate slowly and add thiamine  x 5 days  Admit weight: 97.5 kg  Current weight: 99.4 kg 10.5% weight loss x 3 months, severe (1/25-4/25)   Intake/Output Summary (Last 24 hours) at 11/06/2023 1635 Last data filed at 11/06/2023 0600 Gross per 24 hour  Intake 1543.4 ml  Output 800 ml  Net 743.4 ml  Net IO Since Admission: 743.4 mL [11/06/23 1635]  Drains/Lines: PICC single lumen right   Nutritionally Relevant Medications: Scheduled Meds:  dexamethasone   3 mg Per Tube Daily   insulin  aspart  0-15 Units Subcutaneous Q4H   pravastatin   40 mg Per Tube q1800   Continuous Infusions:  famotidine  (PEPCID ) IV Stopped (11/05/23 2317)   meropenem  (MERREM ) IV Stopped (11/06/23 0129)   norepinephrine  (LEVOPHED ) Adult infusion 6 mcg/min (11/06/23 0817)   [START ON 11/07/2023] vancomycin      PRN Meds: ondansetron    Labs Reviewed: Potassium 3.4 BUN 47, creatinine 2.17 Magnesium  1.4 CBG ranges from 102-146 mg/dL over the last 24 hours HgbA1c 6.7% (1/24)  NUTRITION - FOCUSED PHYSICAL EXAM: Flowsheet Row Most Recent Value  Orbital Region Moderate  depletion  Upper Arm Region No depletion  Thoracic and Lumbar Region No depletion  Buccal Region Moderate depletion  Temple Region Mild depletion  Clavicle Bone Region Mild depletion  Clavicle and Acromion Bone Region No depletion  Scapular Bone Region Moderate depletion  Dorsal Hand Severe depletion  Patellar Region Severe depletion  Anterior Thigh Region Severe depletion   Posterior Calf Region Severe depletion  Edema (RD Assessment) Mild  Hair Reviewed  [thin]  Eyes Reviewed  Mouth Reviewed  Skin Reviewed  Nails Reviewed   Diet Order:   Diet Order             Diet NPO time specified  Diet effective now                   EDUCATION NEEDS:  Education needs have been addressed  Skin:  Skin Assessment: Skin Integrity Issues: Stage 2: - buttocks (1.5 x 1.5)  Last BM:  unsure  Height:  Ht Readings from Last 1 Encounters:  11/05/23 5\' 7"  (1.702 m)    Weight:  Wt Readings from Last 1 Encounters:  11/06/23 99.4 kg    Ideal Body Weight:  61.4 kg  BMI:  Body mass index is 34.32 kg/m.  Estimated Nutritional Needs:  Kcal:  1800-2000 kcal/d Protein:  95-110 g/d Fluid:  2L/d    Edwena Graham, RD, LDN Registered Dietitian II Please reach out via secure chat

## 2023-11-06 NOTE — Procedures (Signed)
 Cortrak  Person Inserting Tube:  Kendell Bane C, RD Tube Type:  Cortrak - 43 inches Tube Size:  10 Tube Location:  Left nare Initial Placement:  Stomach Secured by: Bridle Technique Used to Measure Tube Placement:  Marking at nare/corner of mouth Cortrak Secured At:  65 cm   Cortrak Tube Team Note:  Consult received to place a Cortrak feeding tube.   No x-ray is required. RN may begin using tube.   If the tube becomes dislodged please keep the tube and contact the Cortrak team at [email or web address] for replacement.  If after hours and replacement cannot be delayed, place a NG tube and confirm placement with an abdominal x-ray.   Cammy Copa., RD, LDN, CNSC See AMiON for contact information

## 2023-11-06 NOTE — Progress Notes (Signed)
 eLink Physician-Brief Progress Note Patient Name: [name] DOB: 07/04/1951 MRN: [id number]   Date of Service  11/06/2023  HPI/Events of Note  Patient admitted with sepsis likely from a UTI as well as decubitus ulcers, she also presented with altered mental status and acute respiratory failure.  eICU Interventions  New Patient Evaluation.        Dana Morton Dana Morton Dana Morton 11/06/2023, 4:44 AM

## 2023-11-06 NOTE — TOC Initial Note (Addendum)
 Transition of Care [facility]) - Initial/Assessment Note    Patient Details  Name: Dana Morton MRN: [id number] Date of Birth: Jan 06, 1951  Transition of Care [facility]) CM/SW Contact:    Juliane Och, LCSW Phone Number: 11/06/2023, 9:22 AM  Clinical Narrative:                  9:22 AM CSW introduced self and role to patient's spouse, [name] (patient is currently fully disoriented). [name] confirmed that patient is from [facility] and was there for approximately two weeks prior to admissions. [name] stated that before patient was at SNF she resides at home with him. [name] stated that he could provide transportation if needed upon discharge for patient. [name] stated that patient has a walker at home but does not have HH history. [name] expressed interest in patient returning to [facility] upon discharge if appropriate. CSW contacted [facility] SNF who confirmed patient was STR at [facility].  Expected Discharge Plan: Skilled Nursing Facility Barriers to Discharge: Continued Medical Work up   Patient Goals and CMS Choice            Expected Discharge Plan and Services In-house Referral: Clinical Social Work     Living arrangements for the past 2 months: Skilled Nursing Facility, Single Family Home                                      Prior Living Arrangements/Services Living arrangements for the past 2 months: Skilled Nursing Facility, Single Family Home Lives with:: Facility Resident, Spouse Patient language and need for interpreter reviewed:: Yes        Need for Family Participation in Patient Care: Yes (Comment)     Criminal Activity/Legal Involvement Pertinent to Current Situation/Hospitalization: No - Comment as needed  Activities of Daily Living      Permission Sought/Granted Permission sought to share information with : Family Supports, Oceanographer granted to share information with : No (Contact information  on chart)  Share Information with NAME: [place] Dalporto  Permission granted to share info w AGENCY: [facility]  Permission granted to share info w Relationship: Spouse  Permission granted to share info w Contact Information: [phone]  Emotional Assessment Appearance:: Appears stated age Attitude/Demeanor/Rapport: Unable to Assess Affect (typically observed): Unable to Assess   Alcohol / Substance Use: Not Applicable Psych Involvement: No (comment)  Admission diagnosis:  Sepsis [facility]) [A41.9] Patient Active Problem List   Diagnosis Date Noted   Sepsis (HCC) 11/05/2023   Pressure injury of skin 10/21/2023   Urinary retention 10/21/2023   Declining functional status 10/21/2023   Head contusion 10/21/2023   Accidental fall from bed 10/21/2023   Hypotension due to hypovolemia 10/21/2023   Subacute delirium 10/20/2023   Gastroesophageal reflux disease 08/12/2023   Hypokalemia 08/12/2023   Hypomagnesemia 08/12/2023   Hyperlipidemia 08/12/2023   Neutropenia (HCC) 08/12/2023   Pancytopenia (HCC) 08/12/2023   Seizure disorder (HCC) 08/07/2023   Thrombocytopenia (HCC) 08/06/2023   Glioblastoma (HCC) 08/06/2023   Aphasia 05/12/2023   Mass of brain 05/12/2023   S/P total knee arthroplasty 09/23/2017   Diabetes mellitus type 2, uncomplicated (HCC) 09/22/2017   Hypertension 09/22/2017   Hypothyroidism, unspecified 09/22/2017   Vitamin D  deficiency, unspecified 09/22/2017   Edema of both legs 03/03/2017   BMI 40.0-44.9, adult (HCC) 12/03/2015   Pure hypercholesterolemia 05/26/2014   PCP:  Antonietta Battles  III, MD Pharmacy:   [facility] [phone] [place], [place] - [address] [place] [address] Phone: [phone] Fax: [phone]     Social Drivers of Health (SDOH) Social History: SDOH Screenings   Food Insecurity: Patient Unable To Answer (10/20/2023)  Housing: Patient Unable To Answer (10/20/2023)  Transportation Needs: Patient Unable To  Answer (10/20/2023)  Utilities: Patient Unable To Answer (10/20/2023)  Financial Resource Strain: Low Risk  (10/03/2023)   Received from [facility]  Social Connections: Unknown (10/20/2023)  Recent Concern: Social Connections - Moderately Isolated (08/08/2023)  Tobacco Use: Low Risk  (11/05/2023)   SDOH Interventions:     Readmission Risk Interventions     No data to display

## 2023-11-06 NOTE — Progress Notes (Addendum)
 eLink Physician-Brief Progress Note Patient Name: Dana Morton DOB: 10/12/1950 MRN: [id number]   Date of Service  11/06/2023  HPI/Events of Note  Patient with intermittent movements, eye twitching, and altered mental status r/o seizures. CT head in ED was without acute findings. K+ 3.4, Mg++ 1.2, Cr 2.17  eICU Interventions  cEEG ordered. K+ and Mg++ replaced.        Admiral Marcucci U Abagale Boulos 11/06/2023, 5:15 AM

## 2023-11-06 NOTE — Progress Notes (Signed)
 eLink Physician-Brief Progress Note Patient Name: DONNIELLE ADDISON DOB: [birth year] MRN: [id number]   Date of Service  11/06/2023  HPI/Events of Note  Bradycardia - began during dayshift On levophed  which has been titrated up to 12 and currently stable dosing since this afternoon  HR ranges in 40s-60s. Patient minimally responsive  Electrolytes repleted earlier today  eICU Interventions  Atropine  at bedside. Consider administration if hypotension worsens.  Continue tele monitoring     Intervention Category Intermediate Interventions: Arrhythmia - evaluation and management  Andilynn Delavega Genetta Kenning 11/06/2023, 9:13 PM

## 2023-11-06 NOTE — Progress Notes (Signed)
 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  She was d/c to facility 4/14 from [facility] after being treated for sepsis, UTI and she had a stage III sacral decubiti.  She has a chronic Foley. On this admission she was noted to not want to eat or drink or take her meds.  She is bed bound and she is minimally responsive but does eat.  According to husband who was at the beside, she has been refusing her food and meds for several weeks now and as a result was sent to ED.  While in the ED her BP dropped necessitating IV pressors.  Her LA was 2.2 and improved to 1.2 with IV fluids.  Her Cr 3.20 when it was 0.74 on 4/9. Husband at bedside wants full code.  Pertinent  Medical History  Anemia, Arthritis, Claustrophobia, Diabetes mellitus without complication (HCC), Diverticulosis, Fibrocystic breast disease (FCBD), Fibrocystic disease of breast, GERD (gastroesophageal reflux disease), Herpes simplex, HLD (hyperlipidemia), Hypercholesteremia, Hyperlipidemia, Hypertension, Hypothyroidism, Pre-diabetes, Pulmonary nodules, Pure hypercholesterolemia, Rotator cuff tear, Seizures (HCC), Sleep apnea, Subacute delirium (10/20/2023), Vitamin D  deficiency, and Vitamin D  deficiency.   Significant Hospital Events: Including procedures, antibiotic start and stop dates in addition to other pertinent events   4/24: admit to ICU for sepsis  Interim History / Subjective:   Reamins lethargic and altered  Objective   Blood pressure 105/66,  pulse 74, temperature 97.7 F (36.5 C), temperature source Axillary, resp. rate 16, height 5\' 7"  (1.702 m), weight 99.4 kg, SpO2 97%.        Intake/Output Summary (Last 24 hours) at 11/06/2023 1006 Last data filed at 11/06/2023 0600 Gross per 24 hour  Intake 1543.4 ml  Output 800 ml  Net 743.4 ml   Filed Weights   11/05/23 1606 11/06/23 0430  Weight: 97.5 kg 99.4 kg    Examination: Gen:      No acute distress, chronically ill-appearing HEENT:  EOMI, sclera anicteric Neck:     No masses; no thyromegaly Lungs:    Clear to auscultation bilaterally; normal respiratory effort CV:         Regular rate and rhythm; no murmurs Abd:      + bowel sounds; soft, non-tender; no palpable masses, no distension Ext:    No edema; adequate peripheral perfusion Skin:      Warm and dry; no rash Neuro: Unresponsive to stimuli  Lab/imaging reviewed Significant for potassium 3.4, BUN/creatinine 47/2.17 Hemoglobin 9.1, platelets 40 Lactic acid 1.2 CT head with no acute abnormality CT abdomen pelvis with nonobstructing right kidney stone, changes consistent with cystitis, diverticular disease.   Resolved Hospital Problem list   Hyperkelemia  Assessment & Plan:  Sepsis, Urosepsis present on admission On levophed  via PICC Give additional fluids. Change to LR Foley changed  On vanco and meropenem  (h/o ESBL)  AKI Likely from sepsis, dehydration from poor oral intake Monitor labs  Hyperkalemia > now hypokalemia after lokelma Replete lytes   Acute respiratory failure Monitor for intubation needs.  High risk for deterioration Checking ABG.  Sacral decub, present on admission Would care consult  Acute metabolic encephalopathy H/o Seizures  Continue AEDs EEG is pending  Calorie malnutrition Cortrak order Start tube feeds and dietary consult  Goals of care She has had a declining course since her operation for glioblastoma late last year with multiple hospitalizations Discussed with  son at bedside and recommended palliative care assessment and possible limitations of care He agrees but his sister and stepfather who are also decision-makers 1.  Of care for now.  Best Practice (right click and "Reselect all SmartList Selections" daily)   Diet/type: NPO w/ meds via tube.  Place core track DVT prophylaxis prophylactic heparin   Pressure ulcer(s): present on admission  GI prophylaxis: H2B Lines: Central line, PICC Foley:  Yes, and it is still needed Code Status:  full code  Critical care time:    The patient is critically ill with multiple organ system failure and requires high complexity decision making for assessment and support, frequent evaluation and titration of therapies, advanced monitoring, review of radiographic studies and interpretation of complex data.   Critical Care Time devoted to patient care services, exclusive of separately billable procedures, described in this note is 35 minutes.   Dierra Riesgo MD [place] [facility] See Amion for pager  If no response to pager , please call [phone] until 7pm After 7:00 pm call Elink  [phone] 11/06/2023, 10:06 AM

## 2023-11-06 NOTE — ED Notes (Signed)
 Foley 43f placed by Alejo Hurter RN 11/05/2023

## 2023-11-06 NOTE — ED Notes (Signed)
 Report given to Roteshia RN. Patient transferred to ICU with this RN on monitor.

## 2023-11-06 NOTE — Progress Notes (Signed)
 LTM EEG hooked up and running - no initial skin breakdown - push button tested - [facility] monitoring.

## 2023-11-07 ENCOUNTER — Inpatient Hospital Stay (HOSPITAL_COMMUNITY)

## 2023-11-07 DIAGNOSIS — N390 Urinary tract infection, site not specified: Secondary | ICD-10-CM | POA: Diagnosis not present

## 2023-11-07 DIAGNOSIS — R001 Bradycardia, unspecified: Secondary | ICD-10-CM

## 2023-11-07 DIAGNOSIS — E43 Unspecified severe protein-calorie malnutrition: Secondary | ICD-10-CM | POA: Insufficient documentation

## 2023-11-07 DIAGNOSIS — J9600 Acute respiratory failure, unspecified whether with hypoxia or hypercapnia: Secondary | ICD-10-CM | POA: Diagnosis not present

## 2023-11-07 DIAGNOSIS — R569 Unspecified convulsions: Secondary | ICD-10-CM

## 2023-11-07 DIAGNOSIS — A419 Sepsis, unspecified organism: Secondary | ICD-10-CM | POA: Diagnosis not present

## 2023-11-07 DIAGNOSIS — N179 Acute kidney failure, unspecified: Secondary | ICD-10-CM | POA: Diagnosis not present

## 2023-11-07 DIAGNOSIS — R4182 Altered mental status, unspecified: Secondary | ICD-10-CM

## 2023-11-07 LAB — COMPREHENSIVE METABOLIC PANEL WITH GFR
ALT: 37 U/L (ref 0–44)
AST: 50 U/L — ABNORMAL HIGH (ref 15–41)
Albumin: 2.3 g/dL — ABNORMAL LOW (ref 3.5–5.0)
Alkaline Phosphatase: 95 U/L (ref 38–126)
Anion gap: 8 (ref 5–15)
BUN: 24 mg/dL — ABNORMAL HIGH (ref 8–23)
CO2: 23 mmol/L (ref 22–32)
Calcium: 8.4 mg/dL — ABNORMAL LOW (ref 8.9–10.3)
Chloride: 110 mmol/L (ref 98–111)
Creatinine, Ser: 0.78 mg/dL (ref 0.44–1.00)
GFR, Estimated: >60 mL/min (ref >60)
Glucose, Bld: 203 mg/dL — ABNORMAL HIGH (ref 70–99)
Potassium: 3.9 mmol/L (ref 3.5–5.1)
Sodium: 141 mmol/L (ref 135–145)
Total Bilirubin: 0.6 mg/dL (ref 0.0–1.2)
Total Protein: 5.8 g/dL — ABNORMAL LOW (ref 6.5–8.1)

## 2023-11-07 LAB — CBC
HCT: 25.9 % — ABNORMAL LOW (ref 36.0–46.0)
Hemoglobin: 8.9 g/dL — ABNORMAL LOW (ref 12.0–15.0)
MCH: 35.3 pg — ABNORMAL HIGH (ref 26.0–34.0)
MCHC: 34.4 g/dL (ref 30.0–36.0)
MCV: 102.8 fL — ABNORMAL HIGH (ref 80.0–100.0)
Platelets: 42 10*3/uL — ABNORMAL LOW (ref 150–400)
RBC: 2.52 MIL/uL — ABNORMAL LOW (ref 3.87–5.11)
RDW: 20.4 % — ABNORMAL HIGH (ref 11.5–15.5)
WBC: 4.5 10*3/uL (ref 4.0–10.5)
nRBC: 0.7 % — ABNORMAL HIGH (ref 0.0–0.2)

## 2023-11-07 LAB — GLUCOSE, CAPILLARY
Glucose-Capillary: 127 mg/dL — ABNORMAL HIGH (ref 70–99)
Glucose-Capillary: 149 mg/dL — ABNORMAL HIGH (ref 70–99)
Glucose-Capillary: 174 mg/dL — ABNORMAL HIGH (ref 70–99)
Glucose-Capillary: 187 mg/dL — ABNORMAL HIGH (ref 70–99)
Glucose-Capillary: 189 mg/dL — ABNORMAL HIGH (ref 70–99)
Glucose-Capillary: 200 mg/dL — ABNORMAL HIGH (ref 70–99)
Glucose-Capillary: 207 mg/dL — ABNORMAL HIGH (ref 70–99)
Glucose-Capillary: 216 mg/dL — ABNORMAL HIGH (ref 70–99)

## 2023-11-07 LAB — MAGNESIUM: Magnesium: 2.2 mg/dL (ref 1.7–2.4)

## 2023-11-07 LAB — PHOSPHORUS: Phosphorus: 2.7 mg/dL (ref 2.5–4.6)

## 2023-11-07 MED ORDER — MIRTAZAPINE 15 MG PO TABS
7.5000 mg | ORAL_TABLET | Freq: Every day | ORAL | Status: DC
  Administered 2023-11-07: 7.5 mg via ORAL
  Filled 2023-11-07: qty 1

## 2023-11-07 MED ORDER — SODIUM CHLORIDE 0.9% FLUSH: 10.0000 mL | INTRAVENOUS | Status: DC | PRN

## 2023-11-07 MED ORDER — ORAL CARE MOUTH RINSE: 15.0000 mL | OROMUCOSAL | Status: DC | PRN

## 2023-11-07 MED ORDER — ORAL CARE MOUTH RINSE
15.0000 mL | OROMUCOSAL | Status: DC
  Administered 2023-11-07 – 2023-11-22 (×59): 15 mL via OROMUCOSAL

## 2023-11-07 MED ORDER — SODIUM CHLORIDE 0.9% FLUSH
10.0000 mL | Freq: Two times a day (BID) | INTRAVENOUS | Status: DC
  Administered 2023-11-07 – 2023-11-22 (×30): 10 mL

## 2023-11-07 MED ORDER — CHLORHEXIDINE GLUCONATE CLOTH 2 % EX PADS
6.0000 | MEDICATED_PAD | Freq: Every day | CUTANEOUS | Status: DC
  Administered 2023-11-07 – 2023-11-22 (×16): 6 via TOPICAL

## 2023-11-07 MED ORDER — VANCOMYCIN HCL 1250 MG/250ML IV SOLN
1250.0000 mg | INTRAVENOUS | Status: DC
  Filled 2023-11-07: qty 250

## 2023-11-07 MED ORDER — SODIUM CHLORIDE 0.9 % IV SOLN: INTRAVENOUS | Status: AC | PRN

## 2023-11-07 MED ORDER — FAMOTIDINE 20 MG PO TABS
20.0000 mg | ORAL_TABLET | Freq: Two times a day (BID) | ORAL | Status: DC
  Administered 2023-11-07 – 2023-11-22 (×31): 20 mg
  Filled 2023-11-07 (×32): qty 1

## 2023-11-07 MED ORDER — MEROPENEM 1 G IV SOLR
1.0000 g | Freq: Three times a day (TID) | INTRAVENOUS | Status: AC
  Administered 2023-11-07 – 2023-11-09 (×9): 1 g via INTRAVENOUS
  Filled 2023-11-07 (×9): qty 20

## 2023-11-07 MED ORDER — MEDIHONEY WOUND/BURN DRESSING EX PSTE
1.0000 | PASTE | Freq: Every day | CUTANEOUS | Status: DC
  Administered 2023-11-08 – 2023-11-12 (×5): 1 via TOPICAL
  Filled 2023-11-07 (×2): qty 44

## 2023-11-07 MED ORDER — VANCOMYCIN HCL 1.25 G IV SOLR
1250.0000 mg | INTRAVENOUS | Status: DC
  Administered 2023-11-07 – 2023-11-08 (×2): 1250 mg via INTRAVENOUS
  Filled 2023-11-07 (×2): qty 25

## 2023-11-07 NOTE — Progress Notes (Signed)
 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  She was d/c to facility 4/14 from [facility] after being treated for sepsis, UTI and she had a stage III sacral decubiti.  She has a chronic Foley. On this admission she was noted to not want to eat or drink or take her meds.  She is bed bound and she is minimally responsive but does eat.  According to husband who was at the beside, she has been refusing her food and meds for several weeks now and as a result was sent to ED.  While in the ED her BP dropped necessitating IV pressors.  Her LA was 2.2 and improved to 1.2 with IV fluids.  Her Cr 3.20 when it was 0.74 on 4/9. Husband at bedside wants full code.  Pertinent  Medical History  Anemia, Arthritis, Claustrophobia, Diabetes mellitus without complication (HCC), Diverticulosis, Fibrocystic breast disease (FCBD), Fibrocystic disease of breast, GERD (gastroesophageal reflux disease), Herpes simplex, HLD (hyperlipidemia), Hypercholesteremia, Hyperlipidemia, Hypertension, Hypothyroidism, Pre-diabetes, Pulmonary nodules, Pure hypercholesterolemia, Rotator cuff tear, Seizures (HCC), Sleep apnea, Subacute delirium (10/20/2023), Vitamin D  deficiency, and Vitamin D  deficiency.   Significant Hospital Events: Including procedures, antibiotic start and stop dates in addition to other pertinent events   4/24: admit to ICU for sepsis  Interim History / Subjective:   Mental status is better EEG without seizures Remains on Levophed  at  9 mcg  Objective   Blood pressure (!) 121/96, pulse 77, temperature 97.9 F (36.6 C), temperature source Axillary, resp. rate 14, height 5\' 7"  (1.702 m), weight 99.4 kg, SpO2 97%.        Intake/Output Summary (Last 24 hours) at 11/07/2023 0831 Last data filed at 11/07/2023 0700 Gross per 24 hour  Intake 4158.91 ml  Output 2660 ml  Net 1498.91 ml   Filed Weights   11/05/23 1606 11/06/23 0430  Weight: 97.5 kg 99.4 kg    Examination: Gen:      No acute distress, chronically ill-appearing HEENT:  EOMI, sclera anicteric Neck:     No masses; no thyromegaly Lungs:    Clear to auscultation bilaterally; normal respiratory effort CV:         Regular rate and rhythm; no murmurs Abd:      + bowel sounds; soft, non-tender; no palpable masses, no distension Ext:    No edema; adequate peripheral perfusion, Left 3rd toe swelling Skin:      Warm and dry; no rash Neuro: Awake, does not answer appropriately  Lab/imaging reviewed Significant for BUN/creatinine 24/0.78 AST 50, ALT 37 WBC 7.5, hemoglobin 9.1, platelets 40  CT head with no acute abnormality CT abdomen pelvis with nonobstructing right kidney stone, changes consistent with cystitis, diverticular disease.   Resolved Hospital Problem list   Hyperkelemia Hypokalemia  Assessment & Plan:  Sepsis, Urosepsis present on admission On levophed  via PICC Continue IV fluid Foley changed  On vanco and meropenem  (h/o ESBL) Follow cultures. Resent urine cultures  AKI > improved Likely from sepsis, dehydration from poor oral intake Monitor labs  Bradycardia Monitor  Atropine  at bedside   Acute respiratory failure Monitor for intubation needs.  High risk for deterioration  Sacral decub, present on admission Would care consult  Acute metabolic encephalopathy H/o Seizures  Continue AEDs Contine EEG  Severe Calorie malnutrition, present on admission Cortrak order Tube feeds  Left toe swelling Get x ray  Goals of  care She has had a declining course since her operation for glioblastoma late last year with multiple hospitalizations Discussed with son at bedside and recommended palliative care assessment and possible limitations of care He agrees but his sister and stepfather who are also decision-makers 1.  Full code for now.  Best Practice (right click and "Reselect all SmartList Selections" daily)   Diet/type: tubefeeds.   DVT prophylaxis prophylactic heparin   Pressure ulcer(s): present on admission  GI prophylaxis: H2B Lines: Central line, PICC Foley:  Yes, and it is still needed Code Status:  full code  Critical care time:    The patient is critically ill with multiple organ system failure and requires high complexity decision making for assessment and support, frequent evaluation and titration of therapies, advanced monitoring, review of radiographic studies and interpretation of complex data.   Critical Care Time devoted to patient care services, exclusive of separately billable procedures, described in this note is 35 minutes.   Gizzelle Lacomb MD [place] [facility] See Amion for pager  If no response to pager , please call [phone] until 7pm After 7:00 pm call Elink  [phone] 11/07/2023, 8:31 AM

## 2023-11-07 NOTE — Progress Notes (Signed)
 Pharmacy Antibiotic Note  Dana Morton is a 73 y.o. female admitted on 11/05/2023 s/p fall/AMS with concerns for sepsis. Pharmacy has been consulted for vancomycin  dosing.   Vancomycin  2 g was given 4/24 @ ~ 23:00. Since then, renal function has significantly improved (Scr 3.20 >> 0.78 with ~ 2.6 L UOP)   Plan: -Merrem  1000 mg q8h -Vancomycin  1250 q24h (AUC 464, Vd 0.5, IBW, sCr 0.8) -Monitor renal function -Follow up signs of clinical improvement, LOT, de-escalation of antibiotics   Height: 5\' 7"  (170.2 cm) Weight: 99.4 kg (219 lb 2.2 oz) IBW/kg (Calculated) : 61.6  Temp (24hrs), Avg:97.2 F (36.2 C), Min:96.3 F (35.7 C), Max:97.9 F (36.6 C)  Recent Labs  Lab 11/05/23 1633 11/05/23 1639 11/05/23 1641 11/05/23 1844 11/06/23 0020 11/07/23 0233  WBC 7.2  --   --   --  7.5 4.5  CREATININE 3.03*  --  3.20*  --  2.17* 0.78  LATICACIDVEN  --  2.2*  --  1.2  --   --     Estimated Creatinine Clearance: 74.7 mL/min (by C-G formula based on SCr of 0.78 mg/dL).    Allergies  Allergen Reactions   Celebrex [Celecoxib] Nausea Only   Codeine Nausea Only    Antimicrobials this admission: Vancomycin  4/24 >>  Merrem  4/24 >>   Microbiology results: 4/24 BCx:  4/24 UCx:  4/25 MRSA PCR neg   Thank you for allowing pharmacy to be a part of this patient's care.  Patience Bonito, PharmD, BCPS, BCCCP Clinical Pharmacist

## 2023-11-07 NOTE — Procedures (Signed)
 Patient Name: Dana Morton  MRN: [id number]  Epilepsy Attending: Arleene Lack  Referring Physician/Provider: Ogan, Okoronkwo U, MD  Duration: 11/06/2023 1202 to 11/07/2023 1202   Patient history: 73yo F with h/o GBP s/p resection now with ams. EEG to evaluate with seizure.    Level of alertness: comatose/lethargic   AEDs during EEG study: LEV, LCM, Onfi    Technical aspects: This EEG study was done with scalp electrodes positioned according to the 10-20 International system of electrode placement. Electrical activity was reviewed with band pass filter of 1-70Hz , sensitivity of 7 uV/mm, display speed of 75mm/sec with a 60Hz  notched filter applied as appropriate. EEG data were recorded continuously and digitally stored.  Video monitoring was available and reviewed as appropriate.   Description: EEG showed continuous generalized and maximal left temporal 3 to 6 Hz theta-delta slowing. Sharp waves were noted in left temporo-parietal region. Hyperventilation and photic stimulation were not performed.      ABNORMALITY - Sharp waves, left temporo-parietal region. 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 Additionally there is cortical dysfunction arising from left temporal region, likely secondary to underlying structural abnormality. Lastly there is moderate diffuse encephalopathy. No seizures were seen throughout the recording.   Deannie Resetar O Denzel Etienne

## 2023-11-07 NOTE — Consult Note (Signed)
 WOC Nurse Consult Note: Reason for Consult: pressure injuries Patient from SNF; declining FTT Wound type: Unstageable Pressure Injury: left buttock; 100% necrotic Unstageable Pressure Injury: right buttock; 50% pink/50% black; linear wound Pressure Injury POA: Yes Measurement: see nursing flow sheets Wound bed:see above  Drainage (amount, consistency, odor) see nursing flow sheets Periwound: see nursing flow sheets Dressing procedure/placement/frequency: Cleanse both wounds with saline, pat dry Apply medihoney to each wound, top with foam. Reapply Medihoney daily LALM in the ICU for moisture management and pressure redistribution Consult RD for wound supplementation  Consider surgical consult if aggressive care desired     Re consult if needed, will not follow at this time. Thanks  Veronnica Hennings [employer], RN,CWOCN, CNS, CWON-AP [phone])

## 2023-11-08 ENCOUNTER — Inpatient Hospital Stay (HOSPITAL_COMMUNITY)

## 2023-11-08 DIAGNOSIS — R569 Unspecified convulsions: Secondary | ICD-10-CM | POA: Diagnosis not present

## 2023-11-08 DIAGNOSIS — R4182 Altered mental status, unspecified: Secondary | ICD-10-CM | POA: Diagnosis not present

## 2023-11-08 DIAGNOSIS — Z7189 Other specified counseling: Secondary | ICD-10-CM | POA: Diagnosis not present

## 2023-11-08 DIAGNOSIS — N390 Urinary tract infection, site not specified: Secondary | ICD-10-CM | POA: Diagnosis not present

## 2023-11-08 DIAGNOSIS — J9600 Acute respiratory failure, unspecified whether with hypoxia or hypercapnia: Secondary | ICD-10-CM | POA: Diagnosis not present

## 2023-11-08 DIAGNOSIS — A419 Sepsis, unspecified organism: Secondary | ICD-10-CM | POA: Diagnosis not present

## 2023-11-08 DIAGNOSIS — Z515 Encounter for palliative care: Secondary | ICD-10-CM | POA: Diagnosis not present

## 2023-11-08 DIAGNOSIS — N179 Acute kidney failure, unspecified: Secondary | ICD-10-CM | POA: Diagnosis not present

## 2023-11-08 LAB — BASIC METABOLIC PANEL WITH GFR
Anion gap: 6 (ref 5–15)
BUN: 32 mg/dL — ABNORMAL HIGH (ref 8–23)
CO2: 27 mmol/L (ref 22–32)
Calcium: 8.7 mg/dL — ABNORMAL LOW (ref 8.9–10.3)
Chloride: 109 mmol/L (ref 98–111)
Creatinine, Ser: 0.68 mg/dL (ref 0.44–1.00)
GFR, Estimated: >60 mL/min (ref >60)
Glucose, Bld: 168 mg/dL — ABNORMAL HIGH (ref 70–99)
Potassium: 3.9 mmol/L (ref 3.5–5.1)
Sodium: 142 mmol/L (ref 135–145)

## 2023-11-08 LAB — CBC
HCT: 21 % — ABNORMAL LOW (ref 36.0–46.0)
Hemoglobin: 7 g/dL — ABNORMAL LOW (ref 12.0–15.0)
MCH: 34.8 pg — ABNORMAL HIGH (ref 26.0–34.0)
MCHC: 33.3 g/dL (ref 30.0–36.0)
MCV: 104.5 fL — ABNORMAL HIGH (ref 80.0–100.0)
Platelets: 30 10*3/uL — ABNORMAL LOW (ref 150–400)
RBC: 2.01 MIL/uL — ABNORMAL LOW (ref 3.87–5.11)
RDW: 21 % — ABNORMAL HIGH (ref 11.5–15.5)
WBC: 3.4 10*3/uL — ABNORMAL LOW (ref 4.0–10.5)
nRBC: 1.2 % — ABNORMAL HIGH (ref 0.0–0.2)

## 2023-11-08 LAB — GLUCOSE, CAPILLARY
Glucose-Capillary: 153 mg/dL — ABNORMAL HIGH (ref 70–99)
Glucose-Capillary: 127 mg/dL — ABNORMAL HIGH (ref 70–99)
Glucose-Capillary: 140 mg/dL — ABNORMAL HIGH (ref 70–99)
Glucose-Capillary: 199 mg/dL — ABNORMAL HIGH (ref 70–99)
Glucose-Capillary: 216 mg/dL — ABNORMAL HIGH (ref 70–99)
Glucose-Capillary: 202 mg/dL — ABNORMAL HIGH (ref 70–99)

## 2023-11-08 LAB — URINE CULTURE: Culture: NO GROWTH

## 2023-11-08 LAB — MAGNESIUM
Magnesium: 1.5 mg/dL — ABNORMAL LOW (ref 1.7–2.4)
Magnesium: 1.9 mg/dL (ref 1.7–2.4)

## 2023-11-08 LAB — PHOSPHORUS
Phosphorus: 1.5 mg/dL — ABNORMAL LOW (ref 2.5–4.6)
Phosphorus: 3.6 mg/dL (ref 2.5–4.6)

## 2023-11-08 LAB — PREPARE RBC (CROSSMATCH)

## 2023-11-08 LAB — HEMOGLOBIN AND HEMATOCRIT, BLOOD
HCT: 24.9 % — ABNORMAL LOW (ref 36.0–46.0)
Hemoglobin: 8.3 g/dL — ABNORMAL LOW (ref 12.0–15.0)

## 2023-11-08 MED ORDER — MAGNESIUM SULFATE 4 GM/100ML IV SOLN
4.0000 g | Freq: Once | INTRAVENOUS | Status: AC
  Administered 2023-11-08: 4 g via INTRAVENOUS
  Filled 2023-11-08: qty 100

## 2023-11-08 MED ORDER — POTASSIUM PHOSPHATES 15 MMOLE/5ML IV SOLN
45.0000 mmol | Freq: Once | INTRAVENOUS | Status: AC
  Administered 2023-11-08: 45 mmol via INTRAVENOUS
  Filled 2023-11-08: qty 15

## 2023-11-08 MED ORDER — SODIUM CHLORIDE 0.9% IV SOLUTION: Freq: Once | INTRAVENOUS | Status: AC

## 2023-11-08 MED ORDER — LEVETIRACETAM 500 MG PO TABS
1000.0000 mg | ORAL_TABLET | Freq: Two times a day (BID) | ORAL | Status: DC
  Administered 2023-11-08 – 2023-11-22 (×28): 1000 mg
  Filled 2023-11-08 (×28): qty 2

## 2023-11-08 MED ORDER — MAGNESIUM SULFATE 2 GM/50ML IV SOLN
2.0000 g | Freq: Once | INTRAVENOUS | Status: AC
  Administered 2023-11-08: 2 g via INTRAVENOUS
  Filled 2023-11-08: qty 50

## 2023-11-08 NOTE — Progress Notes (Signed)
 Pharmacy Electrolyte Replacement  Recent Labs:  Recent Labs    11/08/23 0411 11/08/23 2112  K 3.9  --   MG 1.5* 1.9  PHOS 1.5* 3.6  CREATININE 0.68  --     Low Critical Values (K </= 2.5, Phos </= 1, Mg </= 1) Present: None  MD Contacted: n/a  Plan: No further phosphorous needed for now, Supp Mg 2g x 1 additional dose.  Joanell Mowers, Davey Erp, BCCP Clinical Pharmacist  11/08/2023 9:34 PM   [facility] pharmacy phone numbers are listed on [email or web address]

## 2023-11-08 NOTE — Evaluation (Signed)
 Clinical/Bedside Swallow Evaluation Patient Details  Name: Dana Morton MRN: [id number] Date of Birth: 1950/10/16  Today's Date: 11/08/2023 Time: SLP Start Time (ACUTE ONLY): 1120 SLP Stop Time (ACUTE ONLY): 1145 SLP Time Calculation (min) (ACUTE ONLY): 25 min  Past Medical History:  Past Medical History:  Diagnosis Date   Anemia    h/o    Arthritis    Claustrophobia    Diabetes mellitus without complication (HCC)    Diverticulosis    Fibrocystic breast disease (FCBD)    Fibrocystic disease of breast    GERD (gastroesophageal reflux disease)    Herpes simplex    HLD (hyperlipidemia)    Hypercholesteremia    Hyperlipidemia    Hypertension    Hypothyroidism    Pre-diabetes    Pulmonary nodules    Pure hypercholesterolemia    Rotator cuff tear    LEFT SHOULDER   Seizures (HCC)    Sleep apnea    no cpap   Subacute delirium 10/20/2023   Vitamin D  deficiency    Vitamin D  deficiency    Past Surgical History:  Past Surgical History:  Procedure Laterality Date   ABDOMINAL HYSTERECTOMY     BREAST BIOPSY Left 06/19/2020   stereo biopsy/ x clip/ path pending   BREAST EXCISIONAL BIOPSY Left 70/80s   benign   CHOLECYSTECTOMY     COLONOSCOPY WITH PROPOFOL  N/A 03/22/2015   Procedure: COLONOSCOPY WITH PROPOFOL ;  Surgeon: Stephens Eis, MD;  Location: [facility];  Service: Gastroenterology;  Laterality: N/A;   COLONOSCOPY WITH PROPOFOL  N/A 04/19/2021   Procedure: COLONOSCOPY WITH PROPOFOL ;  Surgeon: Shane Darling, MD;  Location: [facility];  Service: Endoscopy;  Laterality: N/A;  AMPICILLIN - PER OFFICE   DG FOOT HEEL ([facility] HX)     DILATION AND CURETTAGE OF UTERUS     ESOPHAGOGASTRODUODENOSCOPY (EGD) WITH PROPOFOL  N/A 03/22/2015   Procedure: ESOPHAGOGASTRODUODENOSCOPY (EGD) WITH PROPOFOL ;  Surgeon: Stephens Eis, MD;  Location: [facility];  Service: Gastroenterology;  Laterality: N/A;   JOINT REPLACEMENT     KNEE ARTHROPLASTY Right 09/23/2017   Procedure: COMPUTER  ASSISTED TOTAL KNEE ARTHROPLASTY;  Surgeon: Arlyne Lame, MD;  Location: [facility];  Service: Orthopedics;  Laterality: Right;   KNEE ARTHROSCOPY Right 08/02/2015   Procedure: ARTHROSCOPY KNEE, PARTIAL MEDIAL MENISECTOMY, PLICA INCISION;  Surgeon: Molli Angelucci, MD;  Location: [facility];  Service: Orthopedics;  Laterality: Right;   KNEE ARTHROSCOPY     HPI:  Pt is a 73 y/o female who was admitted 4/24 secondary to failure to thrive;  noted to not want to eat or drink or take her meds. Cortrak placed 4/25. CT head negative. EEG 4/27: no seizures; cortical dysfunction from left temporal region, likely secondary to underlying structural abnormality; mild to moderate diffuse encephalopathy. Pt recently admitted to [facility] 4/8 for fall from bed at the rehab/nursing facility and discharged on 4/14. 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 BSE 08/07/23: cognitively based dysphagia; a dysphagia 3 diet started at that time with consideration of pt's mentation and per family's preference.    Assessment / Plan / Recommendation  Clinical Impression  Pt was seen for bedside swallow evaluation with her daughter present. She reported that the pt had been on a regular texture diet and thin liquids up until approximately 2 days prior to admission when a puree diet was initiated. Per the daughter, pt has not been eating/drinking much and  she was unsure as to why or by whom the puree diet was initiated. Pt was lethargic during the evaluation; this improved with verbal and tactile stimulation, but she exhibited difficulty maintaining this improved level of alertness. Oral mechanism exam was limited due to pt's difficulty following commands; however, oral inspection revealed reduced adequate natural dentition. Pt's performance was inconsistent with increased signs of aspiration with thin  liquids and oral holding when alertness was waning. Pt's RN and daughter reported that the pt's mentation fluctuates throughout the day and that alertness was better yesterday. It is recommended that enteral nutrition be continued and that the pt's NPO status be maintained, but with allowance of ice chips after oral care and limited boluses of puree for pleasure. SLP will follow to assess improvement in swallow function. SLP Visit Diagnosis: Dysphagia, oropharyngeal phase (R13.12)    Aspiration Risk  Moderate aspiration risk;Risk for inadequate nutrition/hydration    Diet Recommendation NPO;Ice chips PRN after oral care;Alternative means - temporary (limited boluses of puree for pleasure)    Medication Administration: Via alternative means Supervision: Full supervision/cueing for compensatory strategies Compensations: Slow rate Postural Changes: Seated upright at 90 degrees    Other  Recommendations Oral Care Recommendations: Oral care prior to ice chip/H20    Recommendations for follow up therapy are one component of a multi-disciplinary discharge planning process, led by the attending physician.  Recommendations may be updated based on patient status, additional functional criteria and insurance authorization.  Follow up Recommendations Skilled nursing-short term rehab (<3 hours/day)      Assistance Recommended at Discharge    Functional Status Assessment Patient has had a recent decline in their functional status and demonstrates the ability to make significant improvements in function in a reasonable and predictable amount of time.  Frequency and Duration min 2x/week  2 weeks       Prognosis Prognosis for improved oropharyngeal function: Good Barriers to Reach Goals: Cognitive deficits;Behavior;Severity of deficits      Swallow Study   General Date of Onset: 11/06/23 HPI: Pt is a 73 y/o female who was admitted 4/24 secondary to failure to thrive;  noted to not want to eat or  drink or take her meds. Cortrak placed 4/25. CT head negative. EEG 4/27: no seizures; cortical dysfunction from left temporal region, likely secondary to underlying structural abnormality; mild to moderate diffuse encephalopathy. Pt recently admitted to [facility] 4/8 for fall from bed at the rehab/nursing facility and discharged on 4/14. 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 BSE 08/07/23: cognitively based dysphagia; a dysphagia 3 diet started at that time with consideration of pt's mentation and per family's preference. Type of Study: Bedside Swallow Evaluation Previous Swallow Assessment: See HPI Diet Prior to this Study: Cortrak/Small bore NG tube Temperature Spikes Noted: No Respiratory Status: Room air History of Recent Intubation: No Behavior/Cognition: Pleasant mood;Cooperative;Lethargic/Drowsy;Doesn't follow directions;Requires cueing Oral Cavity Assessment: Within Functional Limits Oral Care Completed by SLP: No Oral Cavity - Dentition: Adequate natural dentition Self-Feeding Abilities: Total assist Patient Positioning: Upright in bed;Postural control adequate for testing Baseline Vocal Quality: Low vocal intensity Volitional Cough: Cognitively unable to elicit Volitional Swallow: Unable to elicit    Oral/Motor/Sensory Function Overall Oral Motor/Sensory Function: Moderate impairment (difficult to assess due to impaired auditory comprehension)   Ice Chips Ice chips: Impaired Presentation: Spoon Oral Phase Functional Implications: Oral holding   Thin Liquid Thin Liquid: Impaired Presentation: Cup;Spoon;Straw Oral  Phase Impairments: Poor awareness of bolus Oral Phase Functional Implications: Oral holding Pharyngeal  Phase Impairments: Cough - Immediate    Nectar Thick Nectar Thick Liquid: Not tested   Honey Thick Honey Thick Liquid: Not tested    Puree Puree: Impaired Presentation: Spoon Oral Phase Impairments: Poor awareness of bolus Oral Phase Functional Implications: Oral holding;Prolonged oral transit   Solid     Solid: Not tested     Siraj Dermody I. Valda Garnet, MS, CCC-SLP Acute Rehabilitation Services Office number [phone]  Dana Morton 11/08/2023,12:02 PM

## 2023-11-08 NOTE — Progress Notes (Signed)
 Pharmacy Electrolyte Replacement  Recent Labs:  Recent Labs    11/08/23 0411  K 3.9  MG 1.5*  PHOS 1.5*  CREATININE 0.68    Low Critical Values (K </= 2.5, Phos </= 1, Mg </= 1) Present: None  MD Contacted: n/a  Plan: Magnesium  sulfate 4 g IV x1, Kphos 45 mmol IV x1 Repeat labs in 12 hours   Patience Bonito, PharmD, BCPS, BCCCP Clinical Pharmacist

## 2023-11-08 NOTE — Progress Notes (Signed)
 PCCM note  Patient is developing red rash around the neck and jaw.  May be reaction to Vanco Will discontinue Vanco as MRSA swab is negative  Phyllis Breeze MD [facility] See Amion for pager  If no response to pager , please call [phone] until 7pm After 7:00 pm call Elink  [phone] 11/08/2023, 2:33 PM

## 2023-11-08 NOTE — Consult Note (Signed)
 Reason for Consult: Gluteal decubitus Referring Physician: Phyllis Breeze  Dana Morton is an 73 y.o. female.  HPI: 73 year old female with a complex past medical history as below including recent craniotomy for glioblastoma resection at [facility] has had a gluteal decubitus wound for several weeks according to her husband.  She was admitted here with urosepsis and I was consulted to evaluate the wound.  She is not able to give any history but her husband was able to provide that.  Past Medical History:  Diagnosis Date   Anemia    h/o    Arthritis    Claustrophobia    Diabetes mellitus without complication (HCC)    Diverticulosis    Fibrocystic breast disease (FCBD)    Fibrocystic disease of breast    GERD (gastroesophageal reflux disease)    Herpes simplex    HLD (hyperlipidemia)    Hypercholesteremia    Hyperlipidemia    Hypertension    Hypothyroidism    Pre-diabetes    Pulmonary nodules    Pure hypercholesterolemia    Rotator cuff tear    LEFT SHOULDER   Seizures (HCC)    Sleep apnea    no cpap   Subacute delirium 10/20/2023   Vitamin D  deficiency    Vitamin D  deficiency     Past Surgical History:  Procedure Laterality Date   ABDOMINAL HYSTERECTOMY     BREAST BIOPSY Left 06/19/2020   stereo biopsy/ x clip/ path pending   BREAST EXCISIONAL BIOPSY Left 70/80s   benign   CHOLECYSTECTOMY     COLONOSCOPY WITH PROPOFOL  N/A 03/22/2015   Procedure: COLONOSCOPY WITH PROPOFOL ;  Surgeon: Stephens Eis, MD;  Location: [facility] ENDOSCOPY;  Service: Gastroenterology;  Laterality: N/A;   COLONOSCOPY WITH PROPOFOL  N/A 04/19/2021   Procedure: COLONOSCOPY WITH PROPOFOL ;  Surgeon: Shane Darling, MD;  Location: [facility] ENDOSCOPY;  Service: Endoscopy;  Laterality: N/A;  AMPICILLIN - PER OFFICE   DG FOOT HEEL ([facility] HX)     DILATION AND CURETTAGE OF UTERUS     ESOPHAGOGASTRODUODENOSCOPY (EGD) WITH PROPOFOL  N/A 03/22/2015   Procedure: ESOPHAGOGASTRODUODENOSCOPY (EGD) WITH PROPOFOL ;  Surgeon:  Stephens Eis, MD;  Location: [facility] ENDOSCOPY;  Service: Gastroenterology;  Laterality: N/A;   JOINT REPLACEMENT     KNEE ARTHROPLASTY Right 09/23/2017   Procedure: COMPUTER ASSISTED TOTAL KNEE ARTHROPLASTY;  Surgeon: Arlyne Lame, MD;  Location: [facility] ORS;  Service: Orthopedics;  Laterality: Right;   KNEE ARTHROSCOPY Right 08/02/2015   Procedure: ARTHROSCOPY KNEE, PARTIAL MEDIAL MENISECTOMY, PLICA INCISION;  Surgeon: Molli Angelucci, MD;  Location: [facility] ORS;  Service: Orthopedics;  Laterality: Right;   KNEE ARTHROSCOPY      Family History  Problem Relation Age of Onset   Breast cancer Neg Hx     Social History:  reports that she has never smoked. She has never used smokeless tobacco. She reports that she does not drink alcohol and does not use drugs.  Allergies:  Allergies  Allergen Reactions   Celebrex [Celecoxib] Nausea Only   Codeine Nausea Only    Medications: I have reviewed the patient's current medications.  Results for orders placed or performed during the hospital encounter of 11/05/23 (from the past 48 hours)  Glucose, capillary     Status: Abnormal   Collection Time: 11/06/23  8:12 PM  Result Value Ref Range   Glucose-Capillary 160 (H) 70 - 99 mg/dL    Comment: Glucose reference range applies only to samples taken after fasting for at least 8 hours.  Glucose, capillary  Status: Abnormal   Collection Time: 11/07/23 12:02 AM  Result Value Ref Range   Glucose-Capillary 200 (H) 70 - 99 mg/dL    Comment: Glucose reference range applies only to samples taken after fasting for at least 8 hours.  CBC     Status: Abnormal   Collection Time: 11/07/23  2:33 AM  Result Value Ref Range   WBC 4.5 4.0 - 10.5 K/uL   RBC 2.52 (L) 3.87 - 5.11 MIL/uL   Hemoglobin 8.9 (L) 12.0 - 15.0 g/dL   HCT 16.1 (L) 09.6 - 04.5 %   MCV 102.8 (H) 80.0 - 100.0 fL   MCH 35.3 (H) 26.0 - 34.0 pg   MCHC 34.4 30.0 - 36.0 g/dL   RDW 40.9 (H) 81.1 - 91.4 %   Platelets 42 (L) 150 - 400 K/uL    Comment:  Immature Platelet Fraction may be clinically indicated, consider ordering this additional test [id number] CONSISTENT WITH PREVIOUS RESULT REPEATED TO VERIFY    nRBC 0.7 (H) 0.0 - 0.2 %    Comment: Performed at [facility], [address] [place], [place] [address]  Comprehensive metabolic panel with GFR     Status: Abnormal   Collection Time: 11/07/23  2:33 AM  Result Value Ref Range   Sodium 141 135 - 145 mmol/L   Potassium 3.9 3.5 - 5.1 mmol/L   Chloride 110 98 - 111 mmol/L   CO2 23 22 - 32 mmol/L   Glucose, Bld 203 (H) 70 - 99 mg/dL    Comment: Glucose reference range applies only to samples taken after fasting for at least 8 hours.   BUN 24 (H) 8 - 23 mg/dL   Creatinine, Ser 7.84 0.44 - 1.00 mg/dL    Comment: DELTA CHECK NOTED   Calcium  8.4 (L) 8.9 - 10.3 mg/dL   Total Protein 5.8 (L) 6.5 - 8.1 g/dL   Albumin 2.3 (L) 3.5 - 5.0 g/dL   AST 50 (H) 15 - 41 U/L   ALT 37 0 - 44 U/L   Alkaline Phosphatase 95 38 - 126 U/L   Total Bilirubin 0.6 0.0 - 1.2 mg/dL   GFR, Estimated >69 >62 mL/min    Comment: (NOTE) Calculated using the CKD-EPI Creatinine Equation (2021)    Anion gap 8 5 - 15    Comment: Performed at [facility], [address], [place], [place] [address]  Magnesium      Status: None   Collection Time: 11/07/23  2:33 AM  Result Value Ref Range   Magnesium  2.2 1.7 - 2.4 mg/dL    Comment: Performed at [facility], [address], [place], [place] [address]  Phosphorus     Status: None   Collection Time: 11/07/23  2:33 AM  Result Value Ref Range   Phosphorus 2.7 2.5 - 4.6 mg/dL    Comment: Performed at [facility], [address] [place], [place] [address]  Glucose, capillary     Status: Abnormal   Collection Time: 11/07/23  3:45 AM  Result Value Ref Range   Glucose-Capillary 207 (H) 70 - 99 mg/dL    Comment: Glucose reference range applies only to samples taken after fasting for at least 8 hours.  Glucose, capillary     Status: Abnormal    Collection Time: 11/07/23  7:42 AM  Result Value Ref Range   Glucose-Capillary 216 (H) 70 - 99 mg/dL    Comment: Glucose reference range applies only to samples taken after  fasting for at least 8 hours.  Urine Culture (for pregnant, neutropenic or urologic patients or patients with an indwelling urinary catheter)     Status: None   Collection Time: 11/07/23  9:18 AM   Specimen: Urine, Catheterized  Result Value Ref Range   Specimen Description URINE, CATHETERIZED    Special Requests NONE    Culture      NO GROWTH Performed at [facility], [address] [place], [place] [address]    Report Status 11/08/2023 FINAL   Glucose, capillary     Status: Abnormal   Collection Time: 11/07/23 11:50 AM  Result Value Ref Range   Glucose-Capillary 127 (H) 70 - 99 mg/dL    Comment: Glucose reference range applies only to samples taken after fasting for at least 8 hours.  Glucose, capillary     Status: Abnormal   Collection Time: 11/07/23  3:46 PM  Result Value Ref Range   Glucose-Capillary 149 (H) 70 - 99 mg/dL    Comment: Glucose reference range applies only to samples taken after fasting for at least 8 hours.  Glucose, capillary     Status: Abnormal   Collection Time: 11/07/23  6:55 PM  Result Value Ref Range   Glucose-Capillary 189 (H) 70 - 99 mg/dL    Comment: Glucose reference range applies only to samples taken after fasting for at least 8 hours.  Glucose, capillary     Status: Abnormal   Collection Time: 11/07/23  7:58 PM  Result Value Ref Range   Glucose-Capillary 187 (H) 70 - 99 mg/dL    Comment: Glucose reference range applies only to samples taken after fasting for at least 8 hours.  Glucose, capillary     Status: Abnormal   Collection Time: 11/07/23 11:44 PM  Result Value Ref Range   Glucose-Capillary 174 (H) 70 - 99 mg/dL    Comment: Glucose reference range applies only to samples taken after fasting for at least 8 hours.  Glucose, capillary     Status: Abnormal    Collection Time: 11/08/23  3:46 AM  Result Value Ref Range   Glucose-Capillary 153 (H) 70 - 99 mg/dL    Comment: Glucose reference range applies only to samples taken after fasting for at least 8 hours.  Magnesium      Status: Abnormal   Collection Time: 11/08/23  4:11 AM  Result Value Ref Range   Magnesium  1.5 (L) 1.7 - 2.4 mg/dL    Comment: Performed at [facility], [address], [place], [place] [address]  Phosphorus     Status: Abnormal   Collection Time: 11/08/23  4:11 AM  Result Value Ref Range   Phosphorus 1.5 (L) 2.5 - 4.6 mg/dL    Comment: Performed at [facility], [address], [place], [place] [address]  Basic metabolic panel with GFR     Status: Abnormal   Collection Time: 11/08/23  4:11 AM  Result Value Ref Range   Sodium 142 135 - 145 mmol/L   Potassium 3.9 3.5 - 5.1 mmol/L   Chloride 109 98 - 111 mmol/L   CO2 27 22 - 32 mmol/L   Glucose, Bld 168 (H) 70 - 99 mg/dL    Comment: Glucose reference range applies only to samples taken after fasting for at least 8 hours.   BUN 32 (H) 8 - 23 mg/dL   Creatinine, Ser 6.23 0.44 - 1.00 mg/dL   Calcium  8.7 (L) 8.9 - 10.3 mg/dL   GFR, Estimated >  60 >60 mL/min    Comment: (NOTE) Calculated using the CKD-EPI Creatinine Equation (2021)    Anion gap 6 5 - 15    Comment: Performed at [facility], [address] [place], [place] [address]  CBC     Status: Abnormal   Collection Time: 11/08/23  5:25 AM  Result Value Ref Range   WBC 3.4 (L) 4.0 - 10.5 K/uL   RBC 2.01 (L) 3.87 - 5.11 MIL/uL   Hemoglobin 7.0 (L) 12.0 - 15.0 g/dL   HCT 65.7 (L) 84.6 - 96.2 %   MCV 104.5 (H) 80.0 - 100.0 fL   MCH 34.8 (H) 26.0 - 34.0 pg   MCHC 33.3 30.0 - 36.0 g/dL   RDW 95.2 (H) 84.1 - 32.4 %   Platelets 30 (L) 150 - 400 K/uL    Comment: Immature Platelet Fraction may be clinically indicated, consider ordering this additional test [id number] REPEATED TO VERIFY    nRBC 1.2 (H) 0.0 - 0.2 %    Comment: Performed at [facility], [address] [place], [place] [address]  Glucose, capillary     Status: Abnormal   Collection Time: 11/08/23  7:29 AM  Result Value Ref Range   Glucose-Capillary 127 (H) 70 - 99 mg/dL    Comment: Glucose reference range applies only to samples taken after fasting for at least 8 hours.  Prepare RBC (crossmatch)     Status: None   Collection Time: 11/08/23  8:30 AM  Result Value Ref Range   Order Confirmation      ORDER PROCESSED BY BLOOD BANK Performed at [facility], [address] [place], [place] [address]   Type and screen [facility]     Status: None (Preliminary result)   Collection Time: 11/08/23  9:08 AM  Result Value Ref Range   ABO/RH(D) O POS    Antibody Screen NEG    Sample Expiration      11/11/2023,2359 Performed at [facility], [address] [place], [place] [address]    Unit Number [id number]    Blood Component Type RED CELLS,LR    Unit division 00    Status of Unit ALLOCATED    Transfusion Status OK TO TRANSFUSE    Crossmatch Result Compatible   Glucose, capillary     Status: Abnormal   Collection Time: 11/08/23 11:26 AM  Result Value Ref Range   Glucose-Capillary 140 (H) 70 - 99 mg/dL    Comment: Glucose reference range applies only to samples taken after fasting for at least 8 hours.  Glucose, capillary     Status: Abnormal   Collection Time: 11/08/23  3:55 PM  Result Value Ref Range   Glucose-Capillary 199 (H) 70 - 99 mg/dL    Comment: Glucose reference range applies only to samples taken after fasting for at least 8 hours.    DG Foot 2 Views Left Result Date: 11/07/2023 CLINICAL DATA:  Foot pain and swelling EXAM: LEFT FOOT - 2 VIEW COMPARISON:  None Available. FINDINGS: Calcaneal spurring and tarsal degenerative changes are noted. No acute fracture or dislocation is noted. No rows of changes are seen. IMPRESSION: Degenerative change without acute abnormality. Electronically Signed   By: Violeta Grey  M.D.   On: 11/07/2023 14:30   DG Chest Port 1 View Result Date: 11/07/2023 CLINICAL DATA:  5626 Acute respiratory failure (HCC) 5626 EXAM: PORTABLE CHEST - 1 VIEW COMPARISON:  11/05/2023 FINDINGS: Feeding tube has been  placed least as far as the stomach, tip not seen. Right arm PICC line to the mid right atrium. Minimal linear opacities at the left lung base. Right lung clear. Heart size and mediastinal contours are within normal limits. No effusion. Mild blunting of the left lateral costophrenic angle. IMPRESSION: Minimal left basilar atelectasis. Electronically Signed   By: Nicoletta Barrier M.D.   On: 11/07/2023 09:22   Overnight EEG with video Result Date: 11/07/2023 Arleene Lack, MD     11/08/2023  6:55 AM Patient Name: Dana Morton MRN: [id number] Epilepsy Attending: Arleene Lack Referring Physician/Provider: Ogan, Okoronkwo U, MD Duration: 11/06/2023 1202 to 11/07/2023 1202  Patient history: 73yo F with h/o GBP s/p resection now with ams. EEG to evaluate with seizure.  Level of alertness: comatose/lethargic  AEDs during EEG study: LEV, LCM, Onfi   Technical aspects: This EEG study was done with scalp electrodes positioned according to the 10-20 International system of electrode placement. Electrical activity was reviewed with band pass filter of 1-70Hz , sensitivity of 7 uV/mm, display speed of 58mm/sec with a 60Hz  notched filter applied as appropriate. EEG data were recorded continuously and digitally stored.  Video monitoring was available and reviewed as appropriate.  Description: EEG showed continuous generalized and maximal left temporal 3 to 6 Hz theta-delta slowing. Sharp waves were noted in left temporo-parietal region. Hyperventilation and photic stimulation were not performed.    ABNORMALITY - Sharp waves, left temporo-parietal region. 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 Additionally there is cortical dysfunction arising from left temporal region, likely secondary to underlying structural abnormality. Lastly there is moderate diffuse encephalopathy. No seizures were seen throughout the recording.  Priyanka Suzanne Erps    Review of Systems  Unable to perform ROS: Mental status change   Blood pressure (!) 99/40, pulse 74, temperature 98.4 F (36.9 C), temperature source Axillary, resp. rate 17, height 5\' 7"  (1.702 m), weight 96 kg, SpO2 94%. Physical Exam Cardiovascular:     Rate and Rhythm: Normal rate.  Pulmonary:     Effort: Pulmonary effort is normal.     Breath sounds: No wheezing.  Abdominal:     General: Abdomen is flat.     Palpations: Abdomen is soft.     Tenderness: There is no abdominal tenderness.  Musculoskeletal:     Comments: Left medial gluteal area has an 8 x 4 cm eschar with decubitus wound.  There is no purulent drainage of significance.  There is a small redness of the right medial gluteus without significant wound.  Neurological:     Mental Status: She is alert.     Assessment/Plan: Decubitus ulcer left medial gluteal area -this is not the source of her sepsis.  This may benefit from surgical debridement while she is in the hospital on a nonemergent basis.  We will follow along.  I discussed the plan with her husband and answered his questions.  Cloyce Darby 11/08/2023, 4:05 PM

## 2023-11-08 NOTE — Progress Notes (Signed)
LTM EEG disconnected - no skin breakdown at unhook. [facility] notified.  

## 2023-11-08 NOTE — Progress Notes (Signed)
 Initial Nutrition Assessment  DOCUMENTATION CODES:   Severe malnutrition in context of acute illness/injury  INTERVENTION:  Osmolite 1.5 at 50 ml/h (1200 ml per day) Prosource TF20 60 ml 1x/d Provides 1880 kcal, 95 gm protein, 914 ml free water daily MVI with minerals 1 packet Juven BID, each packet provides 95 calories, 2.5 grams of protein (collagen), + micronutrients to support wound healing Monitor magnesium  and phosphorus every 12 hours x 4 occurrences, MD to replete as needed, as pt is at risk for refeeding syndrome given malnutrition. Thiamine  100mg  x 5 days   NUTRITION DIAGNOSIS:   Severe Malnutrition related to acute illness (recurrent hospitalizations and rehab stays) as evidenced by severe muscle depletion, moderate fat depletion, percent weight loss (10.5% x 3 months). *still applicable  GOAL:   Patient will meet greater than or equal to 90% of their needs *met through TF  MONITOR:   Diet advancement, Labs, Weight trends, TF tolerance, Skin  REASON FOR ASSESSMENT:   Consult Wound healing  ASSESSMENT:   Pt with hx of glioblastoma s/p resection 05/2023 and radiation therapy, HTN, HLD, DM type 2, and GERD presented to ED from SNF with sepsis.  Labs reviewed: Phos 1.5, Mag 1.5  RN reached out to RD on call about refeeding labs. Electrolytes to be replaced per MD. TF rate does not be adjusted as rate was already titrated per refeeding protocol. Wound healing supplements already ordered and pt is meeting protein needs via TF regimen. Pt is having bowel function. Off pressors on 4/26, and plan to transfer out of ICU on this date.    Intake/Output Summary (Last 24 hours) at 11/08/2023 0919 Last data filed at 11/08/2023 0800 Gross per 24 hour  Intake 2612.71 ml  Output 1220 ml  Net 1392.71 ml    Diet Order:   Diet Order             Diet NPO time specified  Diet effective now                   EDUCATION NEEDS:   Education needs have been  addressed  Skin:  Skin Assessment: Skin Integrity Issues: Skin Integrity Issues:: Unstageable Unstageable: buttocks  Last BM:  4/25  Height:   Ht Readings from Last 1 Encounters:  11/05/23 5\' 7"  (1.702 m)    Weight:   Wt Readings from Last 1 Encounters:  11/08/23 96 kg    Ideal Body Weight:  61.4 kg  BMI:  Body mass index is 33.15 kg/m.  Estimated Nutritional Needs:   Kcal:  1800-2000 kcal/d  Protein:  95-110 g/d  Fluid:  2L/d  Laren Player, MPH, RD, LDN Clinical Dietitian Contact information can be found at [facility].

## 2023-11-08 NOTE — Procedures (Signed)
 Patient Name: Dana Morton  MRN: [id number]  Epilepsy Attending: Arleene Lack  Referring Physician/Provider: Ogan, Okoronkwo U, MD  Duration: 11/07/2023 1202 to 11/08/2023 1045   Patient history: 73yo F with h/o GBP s/p resection now with ams. EEG to evaluate with seizure.    Level of alertness: awake/ lethargic   AEDs during EEG study: LEV, LCM, Onfi    Technical aspects: This EEG study was done with scalp electrodes positioned according to the 10-20 International system of electrode placement. Electrical activity was reviewed with band pass filter of 1-70Hz , sensitivity of 7 uV/mm, display speed of 58mm/sec with a 60Hz  notched filter applied as appropriate. EEG data were recorded continuously and digitally stored.  Video monitoring was available and reviewed as appropriate.   Description: No clear posterior dominant rhythm was seen. EEG showed continuous generalized and maximal left temporal 5 to 6 Hz theta slowing admixed with intermittent 2-3hz  delta slowing. Sharp waves were noted in left temporo-parietal region. Hyperventilation and photic stimulation were not performed.      ABNORMALITY - Sharp waves, left temporo-parietal region. 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 Additionally there is cortical dysfunction arising from left temporal region, likely secondary to underlying structural abnormality. Lastly there is mild to moderate diffuse encephalopathy. No seizures were seen throughout the recording.   Lema Heinkel O Falon Flinchum

## 2023-11-08 NOTE — Consult Note (Signed)
 Palliative Medicine Inpatient Consult Note  Consulting Provider: Mannam, Praveen, MD   Reason for consult:   Palliative Care Consult Services Palliative Medicine Consult  Reason for Consult? 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 She was d/c to facility 4/14 from [facility] after being treated for sepsis, UTI and she had a stage III sacral decubiti. She has a chronic Foley.   Palliative care has been asked to support additional goals of care conversations.   Clinical Assessment/Goals of Care:  *Please note that this is a verbal dictation therefore any spelling or grammatical errors are due to the "Dragon Medical One" system interpretation.  I have reviewed medical records including EPIC notes, labs and imaging, received report from bedside RN, assessed the patient who is lying in bed in NAD.    I met with Jameeka's daughter,  [name] and spouse [name] to further discuss diagnosis prognosis, GOC, EOL wishes, disposition and options.   I introduced Palliative Medicine as specialized medical care for people living with serious illness. It focuses on providing relief from the symptoms and stress of a serious illness. The goal is to improve quality of life for both the patient and the family.  Medical History Review and Understanding:  A review of patients past medical history inclusive of a glioblastoma requiring a resection/chemo/radiation, HTN, hypothyroidism, arthritis, Type II DM, and GERD,   Social History:  Dezyrae lives in [place], [place] . She has been with her husband, [name] for the past 40 years.  She has 1 son and 1 daughter of her own as well as 2  stepdaughters.  She has 4 grandchildren.  She formally worked as an Network engineer.  She is a woman who is identified as "sweet, kind, and selfless". 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  Functional and Nutritional State:  Christeena has been functionally impaired in the setting of multiple recent hospitalizations.  Per her daughter she had been able to sit at the edge of the bed when she was at [facility] though otherwise has required assistance with B ADLs.  Martyna's appetite has been good some days and poor other days.  Advance Directives:  A detailed discussion was had today regarding advanced directives.  Cletus does have advanced directives and her husband, [name] shares he is going to try to find me so we may scanned them into the electronic medical record.  Code Status:  Concepts specific to code status, artifical feeding and hydration, continued IV antibiotics and rehospitalization was had.  The difference between a aggressive medical intervention path  and a palliative comfort care path for this patient at this time was had.   Encouraged patient/family to consider DNR/DNI status understanding evidenced based poor outcomes in similar hospitalized patient, as the cause of arrest is likely associated with advanced chronic/terminal illness rather than an easily reversible acute cardio-pulmonary event. I explained that DNR/DNI does not change the medical plan and it only comes into effect after a person has arrested (died).  It is a protective measure to keep us  from harming the patient in their last moments of life.   Discussed the various forms of resuscitation with patients  husband and daughter.  I shared my concern that cardiopulmonary/chest compressions would cause Simisola a lot of discomfort and bodily injury without benefit.  Discussed that we are unable to try resuscitation "one time".   The topic of resuscitation status is one which the family continues to  deliberate.  Patient's daughter has read "Hard Choices for [employer]" booklet though felt there was a lot in it which was bias.  Patients spouse shares that he does not believe Ophia would want a long term feeding tube.   Discussion:  A review of Natori's health and her decline since January of this year was completed.  Her daughter shares that she had a resection of her glioblastoma and November 2024.  She had been doing very well post resection until she got chemo and radiation which then caused seizures.  She also experienced profound viral encephalitis requiring an admission at [facility] for prolonged treatment from January to February.  [facility] [place] [facility] where she did fairly well until she had additional seizures.  She went to [facility] and then [facility] after which time she had gone to [facility] experienced a fall-went back and then was readmitted to [facility] in the setting of sepsis likely from a urinary infection.  We reviewed the chronic disease trajectory in patients who have multiple co-morbidities. I shared that often an event will occur leading to an  acute hospitalizationsuch as a fall, UTI, PNA, heart failure exacerbation, copd exacerbation, or another illness sort. We discussed that patients may have been functioning at a high plateau initially, then an acute event occurs. We discussed that after this event their function, mental, and nutritional states are compromised. Often with treatment and rehabilitation there is some regain in each individuals health, though often not to their prior baseline level. We discussed that then another event will occur causing a rehospitalization and a further decline. I shared that often this will become a pattern and each event causes greater burden to the individual, depleting them further or their function, cognition, or nutritional state.   Discussed long and short-term concerns in the setting of patient's acute on  chronic disease burden.  Discussed patient's goals with her family which are that she is able to rehabilitate and get back home using the restroom herself.  Reviewed best case and worst-case scenarios. We discussed best case that patient improves and transitions to rehabilitation. Reviewed the though of her fairing well there and going back home.  We reviewed the worst case scenario in that Nadine goes to rehab and comes back to the hospital again. That this cycle continues and each recurring hospitalization causes further decline.   Reviewed if patients family not that she is having more poor days than good days that hospice would be a reasonable consideration. Discussed the differences between inpatient and home hospice care.   At this time patients family plan to allow time for outcomes. They want to honor the patients wishes and see how she can fair.   Discussed the importance of continued conversation with family and their  medical providers regarding overall plan of care and treatment options, ensuring decisions are within the context of the patients values and GOCs.  Decision Maker: Dy,Michael (Spouse): [phone] (Home Phone)   SUMMARY OF RECOMMENDATIONS   Full code at this time - Discussed the options regarding DNR, shared patient would not fair well with chest compressions  Open and honest conversations in the setting of patients disease  burden and precipitous decline in health  Patients spouse will bring in a copy of advanced directives  Ongoing PMT support  Code Status/Advance Care Planning: FULL CODE  Palliative Prophylaxis:  Aspiration, Bowel Regimen, Delirium Protocol, Frequent Pain Assessment, Oral Care, Palliative Wound Care, and Turn Reposition  Additional Recommendations (Limitations, Scope, Preferences): Continue current care  Psycho-social/Spiritual:  Desire for further Chaplaincy support: Yes Additional Recommendations: Education on chronic disease  burden   Prognosis: Limited overall.   Discharge Planning: Likely to skilled nursing.  Vitals:   11/08/23 0700 11/08/23 0726  BP: (!) 130/48   Pulse: 89   Resp: (!) 23   Temp:  97.7 F (36.5 C)  SpO2: 100%     Intake/Output Summary (Last 24 hours) at 11/08/2023 0735 Last data filed at 11/08/2023 0700 Gross per 24 hour  Intake 3073.39 ml  Output 1350 ml  Net 1723.39 ml   Last Weight  Most recent update: 11/08/2023  5:50 AM    Weight  96 kg (211 lb 10.3 oz)            Gen:  Elderly Caucasian F chronically ill appearing HEENT: Coretrack, Dry mucous membranes CV: Regular rate and rhythm  PULM:  On RA, breathing is even and nonlabored ABD: soft/nontender/nondistended  EXT: (+) edema  Neuro: Oriented to self, expressive aphasia  PPS: 20%   This conversation/these recommendations were discussed with patient primary care team, Dr. Waylan Haggard  Total Time: 41 Billing based on MDM: High ______________________________________________________ Camille Cedars [place] Palliative Medicine Team Team Cell Phone: [phone] Please utilize secure chat with additional questions, if there is no response within 30 minutes please call the above phone number  Palliative Medicine Team providers are available by phone from 7am to 7pm daily and can be reached through the team cell phone.  Should this patient require assistance outside of these hours, please call the patient's attending physician.

## 2023-11-08 NOTE — Progress Notes (Addendum)
 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  She was d/c to facility 4/14 from [facility] after being treated for sepsis, UTI and she had a stage III sacral decubiti.  She has a chronic Foley. On this admission she was noted to not want to eat or drink or take her meds.  She is bed bound and she is minimally responsive but does eat.  According to husband who was at the beside, she has been refusing her food and meds for several weeks now and as a result was sent to ED.  While in the ED her BP dropped necessitating IV pressors.  Her LA was 2.2 and improved to 1.2 with IV fluids.  Her Cr 3.20 when it was 0.74 on 4/9. Husband at bedside wants full code.  Pertinent  Medical History  Anemia, Arthritis, Claustrophobia, Diabetes mellitus without complication (HCC), Diverticulosis, Fibrocystic breast disease (FCBD), Fibrocystic disease of breast, GERD (gastroesophageal reflux disease), Herpes simplex, HLD (hyperlipidemia), Hypercholesteremia, Hyperlipidemia, Hypertension, Hypothyroidism, Pre-diabetes, Pulmonary nodules, Pure hypercholesterolemia, Rotator cuff tear, Seizures (HCC), Sleep apnea, Subacute delirium (10/20/2023), Vitamin D  deficiency, and Vitamin D  deficiency.   Significant Hospital Events: Including procedures, antibiotic start and stop dates in addition to other pertinent events   4/24: admit to ICU for sepsis 4/26 off pressors  Interim History / Subjective:   Off pressors for 24 hours No acute  issues.  Objective   Blood pressure (!) 130/48, pulse 89, temperature 97.7 F (36.5 C), temperature source Oral, resp. rate (!) 23, height 5\' 7"  (1.702 m), weight 96 kg, SpO2 100%.        Intake/Output Summary (Last 24 hours) at 11/08/2023 0740 Last data filed at 11/08/2023 0700 Gross per 24 hour  Intake 3073.39 ml  Output 1350 ml  Net 1723.39 ml   Filed Weights   11/07/23 0728 11/07/23 1800 11/08/23 0500  Weight: 96.1 kg 96.1 kg 96 kg    Examination: Blood pressure (!) 130/48, pulse 89, temperature 97.7 F (36.5 C), temperature source Oral, resp. rate (!) 23, height 5\' 7"  (1.702 m), weight 96 kg, SpO2 100%. Gen:      No acute distress, chronically ill-appearing HEENT:  EOMI, sclera anicteric Neck:     No masses; no thyromegaly Lungs:    Clear to auscultation bilaterally; normal respiratory effort CV:         Regular rate and rhythm; no murmurs Abd:      + bowel sounds; soft, non-tender; no palpable masses, no distension Ext:    No edema; adequate peripheral perfusion, left third toe swelling Neuro: Awake, dysarthric  Lab/imaging reviewed Glucose 168, BUN/creatinine 32/0.68, Phos 1.5, mag 1.5 WBC 3.4, hemoglobin 7, platelets 30 Foot x-ray with chronic degenerative changes CT head with no acute abnormality CT abdomen pelvis with nonobstructing right kidney stone, changes consistent with cystitis, diverticular disease.   Resolved Hospital Problem list   Hyperkelemia Hypokalemia Bradycardia  Assessment & Plan:  Sepsis, Urosepsis present on admission Pressors Foley changed  On vanco and meropenem  (h/o ESBL) Follow cultures. Resent urine cultures  AKI on CKD> improved Likely from sepsis, dehydration from poor oral intake Monitor labs  Acute respiratory failure Monitor for intubation needs.  High risk for deterioration  Sacral decub, unstageable present on admission Would care consult recommending surgical evaluation  Acute metabolic encephalopathy H/o Seizures   Continue AEDs Contine EEG  Severe Calorie malnutrition, present on admission Refeeding syndrome Cortrak order Tube feeds, nutrition consulted Replete mag, Phos  Anemia of chronic disease Thrombocytopenia No evidence of active bleed.  Transfuse 1 unit PRBC Continue to monitor CBC Hold SQ heparin   Left toe swelling Secondary to chronic gout.  No evidence of flare Continue monitoring  Goals of care She has had a declining course since her operation for glioblastoma late last year with multiple hospitalizations Discussed with son at bedside and recommended palliative care assessment and possible limitations of care He agrees but his sister and stepfather who are also decision-makers.  Full code for now.  Stable for transfer out of ICU and to hospitalist service  Best Practice (right click and "Reselect all SmartList Selections" daily)   Diet/type: tubefeeds.   DVT prophylaxis prophylactic heparin  on hold Pressure ulcer(s): present on admission  GI prophylaxis: H2B Lines: Central line, PICC Foley:  Yes, and it is still needed, chronic Foley-exchanged on admission Code Status:  full code  Critical care time: NA   Phyllis Breeze MD [facility] See Amion for pager  If no response to pager , please call [phone] until 7pm After 7:00 pm call Elink  [phone] 11/08/2023, 7:40 AM

## 2023-11-09 ENCOUNTER — Inpatient Hospital Stay (HOSPITAL_COMMUNITY)

## 2023-11-09 DIAGNOSIS — Z515 Encounter for palliative care: Secondary | ICD-10-CM | POA: Diagnosis not present

## 2023-11-09 DIAGNOSIS — Z66 Do not resuscitate: Secondary | ICD-10-CM | POA: Diagnosis not present

## 2023-11-09 DIAGNOSIS — Z7189 Other specified counseling: Secondary | ICD-10-CM | POA: Diagnosis not present

## 2023-11-09 DIAGNOSIS — R569 Unspecified convulsions: Secondary | ICD-10-CM | POA: Diagnosis not present

## 2023-11-09 DIAGNOSIS — R4182 Altered mental status, unspecified: Secondary | ICD-10-CM | POA: Diagnosis not present

## 2023-11-09 DIAGNOSIS — A419 Sepsis, unspecified organism: Secondary | ICD-10-CM | POA: Diagnosis not present

## 2023-11-09 LAB — GLUCOSE, CAPILLARY
Glucose-Capillary: 169 mg/dL — ABNORMAL HIGH (ref 70–99)
Glucose-Capillary: 127 mg/dL — ABNORMAL HIGH (ref 70–99)
Glucose-Capillary: 141 mg/dL — ABNORMAL HIGH (ref 70–99)
Glucose-Capillary: 165 mg/dL — ABNORMAL HIGH (ref 70–99)
Glucose-Capillary: 180 mg/dL — ABNORMAL HIGH (ref 70–99)
Glucose-Capillary: 181 mg/dL — ABNORMAL HIGH (ref 70–99)
Glucose-Capillary: 142 mg/dL — ABNORMAL HIGH (ref 70–99)

## 2023-11-09 LAB — CBC
HCT: 23 % — ABNORMAL LOW (ref 36.0–46.0)
Hemoglobin: 7.6 g/dL — ABNORMAL LOW (ref 12.0–15.0)
MCH: 33.8 pg (ref 26.0–34.0)
MCHC: 33 g/dL (ref 30.0–36.0)
MCV: 102.2 fL — ABNORMAL HIGH (ref 80.0–100.0)
Platelets: 29 10*3/uL — CL (ref 150–400)
RBC: 2.25 MIL/uL — ABNORMAL LOW (ref 3.87–5.11)
RDW: 22.7 % — ABNORMAL HIGH (ref 11.5–15.5)
WBC: 3.3 10*3/uL — ABNORMAL LOW (ref 4.0–10.5)
nRBC: 1.8 % — ABNORMAL HIGH (ref 0.0–0.2)

## 2023-11-09 LAB — BLOOD GAS, ARTERIAL
Acid-Base Excess: 8 mmol/L — ABNORMAL HIGH (ref 0.0–2.0)
Bicarbonate: 32 mmol/L — ABNORMAL HIGH (ref 20.0–28.0)
O2 Saturation: 97.1 %
Patient temperature: 36.5
pCO2 arterial: 40 mmHg (ref 32–48)
pH, Arterial: 7.51 — ABNORMAL HIGH (ref 7.35–7.45)
pO2, Arterial: 74 mmHg — ABNORMAL LOW (ref 83–108)

## 2023-11-09 LAB — BPAM RBC
Blood Product Expiration Date: 202505222359
ISSUE DATE / TIME: 202504271608
Unit Type and Rh: 5100

## 2023-11-09 LAB — BASIC METABOLIC PANEL WITH GFR
Anion gap: 8 (ref 5–15)
BUN: 35 mg/dL — ABNORMAL HIGH (ref 8–23)
CO2: 26 mmol/L (ref 22–32)
Calcium: 8.9 mg/dL (ref 8.9–10.3)
Chloride: 105 mmol/L (ref 98–111)
Creatinine, Ser: 0.61 mg/dL (ref 0.44–1.00)
GFR, Estimated: >60 mL/min (ref >60)
Glucose, Bld: 175 mg/dL — ABNORMAL HIGH (ref 70–99)
Potassium: 4.4 mmol/L (ref 3.5–5.1)
Sodium: 139 mmol/L (ref 135–145)

## 2023-11-09 LAB — TYPE AND SCREEN
ABO/RH(D): O POS
Antibody Screen: NEGATIVE
Unit division: 0

## 2023-11-09 LAB — MAGNESIUM: Magnesium: 2.1 mg/dL (ref 1.7–2.4)

## 2023-11-09 LAB — PHOSPHORUS: Phosphorus: 2.9 mg/dL (ref 2.5–4.6)

## 2023-11-09 MED ORDER — ACETAMINOPHEN 325 MG PO TABS
650.0000 mg | ORAL_TABLET | ORAL | Status: DC | PRN
  Administered 2023-11-09: 650 mg via ORAL

## 2023-11-09 MED ORDER — SODIUM CHLORIDE 0.9 % IV BOLUS
500.0000 mL | Freq: Once | INTRAVENOUS | Status: AC
  Administered 2023-11-09: 500 mL via INTRAVENOUS

## 2023-11-09 MED ORDER — ACETAMINOPHEN 325 MG PO TABS
650.0000 mg | ORAL_TABLET | ORAL | Status: DC | PRN
  Administered 2023-11-10 – 2023-11-20 (×6): 650 mg
  Filled 2023-11-09 (×6): qty 2

## 2023-11-09 MED ORDER — MIDODRINE HCL 5 MG PO TABS
10.0000 mg | ORAL_TABLET | Freq: Three times a day (TID) | ORAL | Status: DC
  Administered 2023-11-09 – 2023-11-14 (×15): 10 mg
  Filled 2023-11-09 (×14): qty 2

## 2023-11-09 MED ORDER — MORPHINE SULFATE (PF) 2 MG/ML IV SOLN
2.0000 mg | INTRAVENOUS | Status: DC | PRN
  Administered 2023-11-13 – 2023-11-15 (×4): 2 mg via INTRAVENOUS
  Filled 2023-11-09 (×4): qty 1

## 2023-11-09 MED ORDER — MIDODRINE HCL 5 MG PO TABS
10.0000 mg | ORAL_TABLET | Freq: Three times a day (TID) | ORAL | Status: DC
  Filled 2023-11-09: qty 2

## 2023-11-09 MED ORDER — ACETAMINOPHEN 325 MG PO TABS
ORAL_TABLET | ORAL | Status: AC
  Filled 2023-11-09: qty 2

## 2023-11-09 MED ORDER — LACTATED RINGERS IV BOLUS
500.0000 mL | Freq: Once | INTRAVENOUS | Status: AC
  Administered 2023-11-09: 500 mL via INTRAVENOUS

## 2023-11-09 NOTE — Significant Event (Signed)
 Rapid Response Event Note   Reason for Call :  unresponsive  Initial Focused Assessment:  Patient will respond to deep painful stimuli by moving extremities and moaning.  She does not open her eyes.  She does not follow any commands.  She does have a gag reflex. She is warm and dry. Pupils 4/brisk  Regular non labored breathing.  Lung sounds decreased bases.  Difficult to obtain axillary temp. Rectal temp 97.7 BP 116/50 SR 86  RR 15-18  O2 sat 95-96% on RA   Dr Sherre Docker came to bedside  Interventions:  Oral care Significant stimulation/repositioning.  Orders received for: CT head stat ABG EEG  Reassessment:  After about 30 minutes of stimulation she has her eyes open, tracks and will mumble some words.  Plan of Care:  Await results RN to call if VS become abnormal or if patient becomes difficult to arouse again   Event Summary:   MD Notified: Dr Efrain Grant Call Time: 1423 Arrival Time: 1425 End Time: 1520  Waldemar Guillaume, RN

## 2023-11-09 NOTE — Progress Notes (Signed)
 Pt's BP has been trending down most recent 97/39 MAP 54.  Critical Platelet Count of 29. Attempted to notify E-Link on-call MD. Awaiting further orders.  Sonjia Durie, RN

## 2023-11-09 NOTE — Progress Notes (Signed)
 eLink Physician-Brief Progress Note Patient Name: Dana Morton DOB: [birth year] MRN: [id number]   Date of Service  11/09/2023  HPI/Events of Note  Notified of hypotension with MAP at 54, BP 97/39.  Pt is only getting tube feeds at 50cc/hr.  No IVFs.  She is on room air.   Platelets have also decreased to 29 <-- 30 with no signs of bleeding.  eICU Interventions  Give 500cc LR bolus.  Continue to monitor platelets.     Intervention Category Intermediate Interventions: Hypotension - evaluation and management  Lanell Pinta 11/09/2023, 4:44 AM

## 2023-11-09 NOTE — TOC Progression Note (Signed)
 Transition of Care [facility]) - Progression Note    Patient Details  Name: Dana Morton MRN: [id number] Date of Birth: 1950/09/24  Transition of Care [facility]) CM/SW Contact  Juliane Och, LCSW Phone Number: 11/09/2023, 12:36 PM  Clinical Narrative:     12:36 PM Per progressions, patient is not yet medically ready to discharge to SNF. Palliative care has been consulted for GOC.  Expected Discharge Plan: Skilled Nursing Facility Barriers to Discharge: Continued Medical Work up  Expected Discharge Plan and Services In-house Referral: Clinical Social Work     Living arrangements for the past 2 months: Skilled Holiday representative, Single Family Home                                       Social Determinants of Health (SDOH) Interventions SDOH Screenings   Food Insecurity: No Food Insecurity (11/07/2023)  Housing: Low Risk  (11/07/2023)  Transportation Needs: Patient Unable To Answer (11/07/2023)  Utilities: Patient Unable To Answer (11/07/2023)  Financial Resource Strain: Low Risk  (10/03/2023)   Received from [facility]  Social Connections: Unknown (11/07/2023)  Tobacco Use: Low Risk  (11/05/2023)    Readmission Risk Interventions     No data to display

## 2023-11-09 NOTE — Progress Notes (Signed)
 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 She was d/c to facility 4/14 from [facility] after being treated for sepsis, UTI and she had a stage III sacral decubiti. She has a chronic Foley.    Palliative care has been asked to support additional goals of care conversations.   Today's Discussion 11/09/2023  *Please note that this is a verbal dictation therefore any spelling or grammatical errors are due to the "Dragon Medical One" system interpretation.  Chart reviewed inclusive of vital signs, progress notes, laboratory results, and diagnostic images.   I met with Ahtziri this morning, she was interactive though aphasic on expression though she asked me for a washcloth to cleanse her face. ___________ 1218  I went by the room in later in the early afternoon to speak to Alliah's family after our conversations yesterday though no one was present. Markela remained awake at that time denying pain. __________ 2725  I spoke to patients spouse, [name] on the phone. He shares that the doctor had just called him and shared that Kyana has declined.   The plan is for a head CT and EEG at this time as there have been difficulties awakening Chantal.   Created space and opportunity for patients spouse to explore thoughts feelings and fears regarding her current medical situation. __________  [id number]  I met with Shallen, her husband, [name], and her daughter, [name] on facetime. We reviewed that as of this moment Sherrika is more alert and awake. We discussed that she is in the process of getting an EEG. We reviewed her tenuous health state and how little reserve she has.  I shared openly and  honestly that I would strongly advocate for allowing comfort and trying to get her home as this is her wish. I also shared the importance of discussing her code status.  The plan at this time is to allow patients son, [name] to get here for further conversations.   ___________ 1600PM  A family meeting was held at  this afternoon.   Family members present were patients spouse, [name], daughter on speaker phone, [name], and son on speaker phone [name].   Discussions related to patient clinical state inclusive of her somnolence and inability to awaken this afternoon were completed. We discussed the concern for further deterioration in the oncoming hours to days given her tenuous clinical state.   We again reviewed the chronic disease trajectory and how much Mabry has been through since November 2024.   We discussed Alesha's pressure injuries, immobility, and malnutrition in the context of the larger picture of her possible recovery.  I shared the realities of illness and chronic disease as related to failure to thrive. I shared this is often an early marker of deterioration and end of life. We reviewed further the significance of the integumentary systems injury being that it is our body's largest organ and the possible long term ramifications.  We reviewed the concern that Delrae has continued to decline over the past six months as opposed to improve. I shared very honestly that I do not see this changing irregardless of what is done for her.   We discussed code status at length. It has been decided among family that her being  a DNAR/DNI with escalation of care to the ICU for pressors/antiarrhythmics/bipap if needed is going to be the most honorable measure(s) for the patient at this time.   Patients spouse [name] shares she would not want her chest "pounded on" or to be on a ventilator. They had discussed this with one another over the years.  We reviewed the coretrack as a temporary means of  nutrition though if Britny neglects to show improvement(s) in her eating and drinking then the option(s) are limited to comfort care and hospice. We discussed what hospice in the home versus hospice in a facility may look like. Patients family share they are limited as [name] is the only person in the home and has his own debilities.   We discussed the importance of gaining prognostic insights from ancillary specialists to support additional decisions moving forward.  Plan to meet again on Wednesday.   Questions and concerns addressed/Palliative Support Provided.   Objective Assessment: Vital Signs Vitals:   11/09/23 1406 11/09/23 1500  BP: (!) 116/50 (!) 108/45  Pulse: 82 86  Resp: 16 17  Temp:  97.7 F (36.5 C)  SpO2: 96% 95%    Intake/Output Summary (Last 24 hours) at 11/09/2023 1512 Last data filed at 11/09/2023 0510 Gross per 24 hour  Intake 1668.1 ml  Output 1305 ml  Net 363.1 ml   Last Weight  Most recent update: 11/09/2023  3:06 AM    Weight  101 kg (222 lb 10.6 oz)            Gen:  Elderly Caucasian F chronically ill appearing HEENT: Coretrack, Dry mucous membranes CV: Regular rate and rhythm  PULM:  On RA, breathing is even and nonlabored ABD: soft/nontender/nondistended  EXT: (+) edema  Neuro: Oriented to self, expressive aphasia  SUMMARY OF RECOMMENDATIONS   DNAR/DNI escalation of care to the ICU for pressors/antiarrhythmics/bipap if needed   Ongoing medical work up EEG/CT Scan  Open and honest conversations held regarding best case and worst case scenarios  Continue present care at this time  Plan for another family meeting on Wednesday  Ongoing PMT support  Time Spent: 165 minutes Billing based on MDM: High ______________________________________________________________________________________ Camille Cedars  Palliative Medicine Team Team Cell Phone: [phone] Please utilize secure chat with additional questions, if there is no  response within 30 minutes please call the above phone number  Palliative Medicine Team providers are available by phone from 7am to 7pm daily and can be reached through the team cell phone.  Should this patient require assistance outside of these hours, please call the patient's attending physician.

## 2023-11-09 NOTE — Procedures (Signed)
 Patient Name: Dana Morton  MRN: [id number]  Epilepsy Attending: Arleene Lack  Referring Physician/Provider: Rosena Conradi, MD  Date: 11/09/2023 Duration: 24.30 mins  Patient history: 73yo F with h/o GBP s/p resection now with ams. EEG to evaluate with seizure.    Level of alertness: awake/ lethargic   AEDs during EEG study: LEV, LCM, Onfi    Technical aspects: This EEG study was done with scalp electrodes positioned according to the 10-20 International system of electrode placement. Electrical activity was reviewed with band pass filter of 1-70Hz , sensitivity of 7 uV/mm, display speed of 47mm/sec with a 60Hz  notched filter applied as appropriate. EEG data were recorded continuously and digitally stored.  Video monitoring was available and reviewed as appropriate.   Description: No clear posterior dominant rhythm was seen. EEG showed continuous generalized and maximal left temporal 5 to 6 Hz theta slowing admixed with intermittent 2-3hz  delta slowing. Sharp waves were noted in left temporo-parietal region. Hyperventilation and photic stimulation were not performed.      ABNORMALITY - Sharp waves, left temporo-parietal region. 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 Additionally there is cortical dysfunction arising from left temporal region, likely secondary to underlying structural abnormality. Lastly there is mild to moderate diffuse encephalopathy. No seizures were seen throughout the recording.   Dillon Livermore O Liba Hulsey

## 2023-11-09 NOTE — Hospital Course (Addendum)
 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   Patient was recently discharged from rehab facility on 4/14.  Patient was not taking her medications and food at home and had been bedbound.  According to husband, who was at the beside, she has been refusing her food and meds for several weeks.  In the ED patient was hypotensive and required vasopressors.  Lactic acid was 2.2 initially.  Creatinine was 3.2 from baseline 0.7. See below for further details.  A&P:  Acute metabolic encephalopathy H/o Seizures  History of glioblastoma s/p resection Failure to thrive Goals of care discussion - Continue Vimpat  and Keppra  - Continue dexamethasone  - have treated infection, nutrition, empiric tx for HSV with steroid/valtrex  and patient having no improvement; also s/p EEG/LTM with no active seizure activity - Given aggressive treatments and extensive workup dating back to January (but much further still) along with underlying poor prognosis and functional decline, have discussed persistent decline with her husband.  She has not improved with maximal efforts and appears to be more appropriate for transitioning to hospice at this time; I have relayed this recommendation to him.  Also discussed case with neurology on 11/18/2023 and there is no further need for EEG monitoring; I do not think neuoro-onc will have much to offer - discussed at length with daughter on phone also on 5/8 - transitioned to comfort care after [facility] and family meeting - still no meaningful improvement on several days TF and no change in mentation; still agree with pursuit of hospice and de-escalation of care with focus on comfort  - Bedside discussion held with daughter with her husband on the phone on 11/21/2023; family now in agreement  with transitioning to residential hospice  Septic shock, secondary to UTI - resolved Patient initially received vasopressors.  Foley catheter was changed.  S/p vancomycin  and meropenem  due to history of ESBL.  Urine culture with no growth.  Blood cultures negative  AKI on CKD - resolved   Acute hypoxic respiratory failure Currently on room air   Sacral decub, unstageable present on admission - not a good surgical candidate    Severe Calorie malnutrition, present on admission Refeeding syndrome - s/p cortrak tube and tube feeding - After family discussion, no further tube feeds at discharge   Anemia of chronic disease Thrombocytopenia Received 1 unit of packed RBC.  Significant thrombocytopenia   Left toe swelling Secondary to chronic gout  Hypothyroidism.  Continue Synthroid   Sacral decub, left unstageable present on admission, right buttocks stage II ulceration present on admission Pressure Injury 10/20/23 Buttocks Right Stage 2 -  Partial thickness loss of dermis presenting as a shallow open injury with a red, pink wound bed without slough. the area is pink in the right buttocks (Active)  10/20/23 1500  Location: Buttocks  Location Orientation: Right  Staging: Stage 2 -  Partial thickness loss of dermis presenting as a shallow open injury with a red, pink wound bed without slough.  Wound Description (Comments): the area is pink in the right buttocks  Present on Admission: Yes     Pressure Injury 11/06/23 Buttocks Left Unstageable - Full thickness tissue loss in which the base of the injury is covered by slough (yellow, tan, gray, green or brown) and/or eschar (tan, brown or black) in the wound bed. Full tissue loss in  which base  (Active)  11/06/23 0430  Location: Buttocks  Location Orientation: Left  Staging: Unstageable - Full thickness tissue loss in which the base of the injury is covered by slough (yellow, tan, gray, green or brown) and/or eschar (tan, brown or black)  in the wound bed.  Wound Description (Comments): Full tissue loss in which base of injury covered by slough  Present on Admission: Yes   Wound care recommending surgical intervention and possible debridement in a nonurgent basis.   Hypokalemia.  Repleted   Hypomagnesemia.  Repleted   Class I obesity.Body mass index is 33.52 kg/m.  Nutrition on board.  On tube feeding at this time.   Concern for Herpes Zoster s/p Valtrex  1000 g 3 times daily for 7 days. Will d/c airborne precautions if no more rash; left thigh has crusted over

## 2023-11-09 NOTE — Progress Notes (Signed)
 Subjective: CC: Just had a large bm Seen with RN.   Afebrile. No tachycardia. Last BP 109/54. Not on pressors. Received IVF bolus this am. WBC 3.3. Hgb 7.6. Plt 29.   Objective: Vital signs in last 24 hours: Temp:  [97.5 F (36.4 C)-98.4 F (36.9 C)] 98 F (36.7 C) (04/28 0700) Pulse Rate:  [70-93] 90 (04/28 0700) Resp:  [12-23] 15 (04/28 0700) BP: (82-134)/(38-72) 109/54 (04/28 0700) SpO2:  [93 %-99 %] 98 % (04/28 0700) Weight:  [101 kg] 101 kg (04/28 0259) Last BM Date : 11/06/23  Intake/Output from previous day: 04/27 0701 - 04/28 0700 In: 2903.1 [I.V.:56.7; Blood:355; NG/GT:1363.3; IV Piggyback:1128.2] Out: 1935 [Urine:1935] Intake/Output this shift: No intake/output data recorded.  PE: Gen:  Alert, NAD, pleasant Abd: Soft, ND, NT GU: Foley draining clear yellow urine Sacral wound: Left medial gluteal decubitus wound ~ 8 x 4 cm w/ eschar. No underlying fluctuance or drainage.     Lab Results:  Recent Labs    11/08/23 0525 11/08/23 2111 11/09/23 0345  WBC 3.4*  --  3.3*  HGB 7.0* 8.3* 7.6*  HCT 21.0* 24.9* 23.0*  PLT 30*  --  29*   BMET Recent Labs    11/08/23 0411 11/09/23 0345  NA 142 139  K 3.9 4.4  CL 109 105  CO2 27 26  GLUCOSE 168* 175*  BUN 32* 35*  CREATININE 0.68 0.61  CALCIUM  8.7* 8.9   PT/INR No results for input(s): "LABPROT", "INR" in the last 72 hours. CMP     Component Value Date/Time   NA 139 11/09/2023 0345   NA 140 02/01/2014 1341   K 4.4 11/09/2023 0345   K 3.5 02/01/2014 1341   CL 105 11/09/2023 0345   CL 107 02/01/2014 1341   CO2 26 11/09/2023 0345   CO2 24 02/01/2014 1341   GLUCOSE 175 (H) 11/09/2023 0345   GLUCOSE 127 (H) 02/01/2014 1341   BUN 35 (H) 11/09/2023 0345   BUN 25 (H) 02/01/2014 1341   CREATININE 0.61 11/09/2023 0345   CREATININE 1.19 02/01/2014 1341   CALCIUM  8.9 11/09/2023 0345   CALCIUM  8.6 02/01/2014 1341   PROT 5.8 (L) 11/07/2023 0233   PROT 7.6 12/06/2012 1115   ALBUMIN 2.3 (L)  11/07/2023 0233   ALBUMIN 3.8 12/06/2012 1115   AST 50 (H) 11/07/2023 0233   AST 31 12/06/2012 1115   ALT 37 11/07/2023 0233   ALT 26 12/06/2012 1115   ALKPHOS 95 11/07/2023 0233   ALKPHOS 75 12/06/2012 1115   BILITOT 0.6 11/07/2023 0233   BILITOT 0.4 12/06/2012 1115   GFRNONAA >60 11/09/2023 0345   GFRNONAA 48 (L) 02/01/2014 1341   GFRAA >60 09/09/2017 0839   GFRAA 56 (L) 02/01/2014 1341   Lipase  No results found for: "LIPASE"  Studies/Results: DG Foot 2 Views Left Result Date: 11/07/2023 CLINICAL DATA:  Foot pain and swelling EXAM: LEFT FOOT - 2 VIEW COMPARISON:  None Available. FINDINGS: Calcaneal spurring and tarsal degenerative changes are noted. No acute fracture or dislocation is noted. No rows of changes are seen. IMPRESSION: Degenerative change without acute abnormality. Electronically Signed   By: Violeta Grey M.D.   On: 11/07/2023 14:30    Anti-infectives: Anti-infectives (From admission, onward)    Start     Dose/Rate Route Frequency Ordered Stop   11/07/23 2300  vancomycin  (VANCOREADY) IVPB 750 mg/150 mL  Status:  Discontinued        750 mg 150 mL/hr over  60 Minutes Intravenous Every 48 hours 11/05/23 2234 11/07/23 0825   11/07/23 1000  vancomycin  (VANCOREADY) IVPB 1250 mg/250 mL  Status:  Discontinued        1,250 mg 166.7 mL/hr over 90 Minutes Intravenous Every 24 hours 11/07/23 0825 11/07/23 0834   11/07/23 1000  Vancomycin  (VANCOCIN ) 1,250 mg in sodium chloride  0.9 % 250 mL IVPB  Status:  Discontinued        1,250 mg 166.7 mL/hr over 90 Minutes Intravenous Every 24 hours 11/07/23 0834 11/08/23 1433   11/07/23 0915  meropenem  (MERREM ) 1 g in sodium chloride  0.9 % 100 mL IVPB        1 g 200 mL/hr over 30 Minutes Intravenous Every 8 hours 11/07/23 0820     11/05/23 2330  meropenem  (MERREM ) 500 mg in sodium chloride  0.9 % 100 mL IVPB  Status:  Discontinued        500 mg 200 mL/hr over 30 Minutes Intravenous Every 12 hours 11/05/23 2153 11/07/23 0820   11/05/23  2215  vancomycin  (VANCOREADY) IVPB 2000 mg/400 mL        2,000 mg 200 mL/hr over 120 Minutes Intravenous  Once 11/05/23 2210 11/06/23 0045   11/05/23 2045  meropenem  (MERREM ) 1 g in sodium chloride  0.9 % 100 mL IVPB  Status:  Discontinued        1 g 200 mL/hr over 30 Minutes Intravenous  Once 11/05/23 2043 11/05/23 2214        Assessment/Plan Decubitus ulcer left medial gluteal area  - Agree this is not the source of her sepsis.  This may benefit from debridement while she is in the hospital on a nonemergent basis however her current plt count is currently 29k. Cont current wound care recs as outlined by wocn. Pressure offloading with air mattress and frequent turning. We will follow along.   FEN: Okay for enteral nutrition VTE: On hold for thrombocytopenia ID: [id number] per primary for UTI  UTI AKI History of glioblastoma s/p resection Bedbound Thrombocytopenia  Anemia of chronic disease   I reviewed nursing notes, hospitalist notes, last 24 h vitals and pain scores, last 48 h intake and output, last 24 h labs and trends, and last 24 h imaging results.   LOS: 4 days    Delton Filbert, [facility] 11/09/2023, 8:38 AM Please see Amion for pager number during day hours 7:00am-4:30pm

## 2023-11-09 NOTE — Progress Notes (Signed)
   11/09/23 0427  Assess: MEWS Score  BP (!) 97/39  MAP (mmHg) (!) 54  Pulse Rate 70  ECG Heart Rate 69  Resp 16  SpO2 96 %  Assess: MEWS Score  MEWS Temp 0  MEWS Systolic 1  MEWS Pulse 0  MEWS RR 0  MEWS LOC 1  MEWS Score 2  MEWS Score Color Yellow  Assess: if the MEWS score is Yellow or Red  Were vital signs accurate and taken at a resting state? Yes  Does the patient meet 2 or more of the SIRS criteria? No  MEWS guidelines implemented  Yes, yellow  Treat  MEWS Interventions Considered administering scheduled or prn medications/treatments as ordered  Take Vital Signs  Increase Vital Sign Frequency  Yellow: Q2hr x1, continue Q4hrs until patient remains green for 12hrs  Escalate  MEWS: Escalate Yellow: Discuss with charge nurse and consider notifying provider and/or RRT  Notify: Charge Nurse/RN  Name of Charge Nurse/RN Notified Tanya, RN  Provider Notification  Provider Name/Title Ferol Hoyles, MD  Date Provider Notified 11/09/23  Time Provider Notified [phone]  Method of Notification Call  Notification Reason Change in status  Provider response See new orders  Date of Provider Response 11/09/23  Time of Provider Response 0444  Assess: SIRS CRITERIA  SIRS Temperature  0  SIRS Respirations  0  SIRS Pulse 0  SIRS WBC 1  SIRS Score Sum  1

## 2023-11-09 NOTE — Progress Notes (Signed)
 EEG complete. Results are pending GMD/NW

## 2023-11-09 NOTE — Progress Notes (Addendum)
 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  Patient was recently discharged from rehab facility on 4/14.  Patient was not taking her medications and food at home and had been bedbound.  According to husband, who was at the beside, she has been refusing her food and meds for several weeks.  In the ED, patient was hypotensive and required vasopressors.  Lactic acid was 2.2 initially.  Creatinine was 3.2 from baseline 0.7.[name]  At this time patient has been transferred out of the ICU.     Assessment/Plan: Principal Problem:   Sepsis (HCC) Active Problems:   Protein-calorie malnutrition, severe  Sepsis, secondary to UTI. Patient initially received vasopressors.  Foley catheter was changed.  Received vancomycin  and meropenem  due to history of ESBL.  Currently on meropenem .  Urine culture with no growth.  Blood cultures negative in 4 days.  Afebrile at this time with a temperature max of 98.4 F.  AKI on CKD Improved at this time.  Latest creatinine of 0.6.  Acute respiratory failure Currently on room air.  Continue to monitor closely.   Sacral decub, left unstageable present on admission, right buttocks stage II ulceration present on admission Pressure Injury 10/20/23 Buttocks Right Stage 2 -  Partial thickness loss of dermis presenting as a shallow open injury with a red, pink wound bed without slough. the area is pink in the right buttocks (Active)  10/20/23 1500  Location: Buttocks  Location Orientation: Right  Staging: Stage 2 -  Partial thickness loss of dermis presenting as a shallow open injury with a red, pink wound bed without slough.  Wound Description  (Comments): the area is pink in the right buttocks  Present on Admission: Yes     Pressure Injury 11/06/23 Buttocks Left Unstageable - Full thickness tissue loss in which the base of the injury is covered by slough (yellow, tan, gray, green or brown) and/or eschar (tan, brown or black) in the wound bed. Full tissue loss in which base  (Active)  11/06/23 0430  Location: Buttocks  Location Orientation: Left  Staging: Unstageable - Full thickness tissue loss in which the base of the injury is covered by slough (yellow, tan, gray, green or brown) and/or eschar (tan, brown or black) in the wound bed.  Wound Description (Comments): Full tissue loss in which base of injury covered by slough  Present on Admission: Yes    Wound care recommending surgical intervention and possible debridement in a nonurgent basis.   Acute metabolic encephalopathy H/o Seizures  History of glioblastoma s/p resection. Continue AEDs with Vimpat  and Keppra ..  Continue dexamethasone . On mittens, appears to be confused and disoriented.   Severe Calorie malnutrition, present on admission Body mass index is 34.87 kg/m.  Refeeding syndrome Nutrition Status: Nutrition Problem: Severe Malnutrition Etiology: acute illness (recurrent hospitalizations and rehab stays) Signs/Symptoms: severe muscle depletion, moderate fat depletion, percent weight loss (10.5% x 3 months) Percent weight loss: 10.5 % Interventions: Juven, MVI, Prostat, Tube feeding   On cortrak tube and tube feeding.  Currently on Osmolite 1.5.  And Prosource.   Anemia of chronic disease Thrombocytopenia Received 1 unit of packed RBC.  Continue to monitor closely.  Significant thrombocytopenia.  Check  CBC in AM.   Left toe swelling Secondary to chronic gout.  Continue to monitor.  Hypothyroidism.  Continue Synthroid   Class I obesity.Body mass index is 34.87 kg/m.  Nutrition on board.  On tube feeding at this time.   Goals of care ICU provider had  spoken with the patient's family regarding declining course with glioblastoma and multiple hospitalizations.  Family still wishing full code for now.  Son had agreed on palliative care assessment but his sister and stepfather are also decision-makers.    DVT prophylaxis: SCDs Start: 11/05/23 2151   Addendum:  11/09/2023 3:50 PM   Rapid response was called in for decreased responsiveness with patient was barely moaning.  Vitals were stable including blood glucose levels.  ABG reviewed with no CO2 retention but mild hypoxia.  Will get CT head scan to rule out intracranial hemorrhage.  Had a long conversation with the patient's spouse in patient's daughter around 30 minutes regarding overall clinical condition of the patient and potential for deterioration and poor prognosis.  At this time they wish to discuss with palliative care for further goals of care and would like to have a conference call.  Palliative care updated.  Disposition: Uncertain at this time.  Status is: Inpatient Remains inpatient appropriate because: Pending clinical improvement, cortrak tube tube feeding, encephalopathy    Code Status:     Code Status: Full Code  Family Communication: None at bedside  Consultants: Palliative care PCCM  Procedures: Cortrak tube tube feeding.  Anti-infectives:  Meropenem   Anti-infectives (From admission, onward)    Start     Dose/Rate Route Frequency Ordered Stop   11/07/23 2300  vancomycin  (VANCOREADY) IVPB 750 mg/150 mL  Status:  Discontinued        750 mg 150 mL/hr over 60 Minutes Intravenous Every 48 hours 11/05/23 2234 11/07/23 0825   11/07/23 1000  vancomycin  (VANCOREADY) IVPB 1250 mg/250 mL  Status:  Discontinued        1,250 mg 166.7 mL/hr over 90 Minutes Intravenous Every 24 hours 11/07/23 0825 11/07/23 0834   11/07/23 1000  Vancomycin  (VANCOCIN ) 1,250 mg in sodium chloride  0.9 % 250 mL IVPB  Status:  Discontinued        1,250 mg 166.7 mL/hr over 90 Minutes  Intravenous Every 24 hours 11/07/23 0834 11/08/23 1433   11/07/23 0915  meropenem  (MERREM ) 1 g in sodium chloride  0.9 % 100 mL IVPB        1 g 200 mL/hr over 30 Minutes Intravenous Every 8 hours 11/07/23 0820     11/05/23 2330  meropenem  (MERREM ) 500 mg in sodium chloride  0.9 % 100 mL IVPB  Status:  Discontinued        500 mg 200 mL/hr over 30 Minutes Intravenous Every 12 hours 11/05/23 2153 11/07/23 0820   11/05/23 2215  vancomycin  (VANCOREADY) IVPB 2000 mg/400 mL        2,000 mg 200 mL/hr over 120 Minutes Intravenous  Once 11/05/23 2210 11/06/23 0045   11/05/23 2045  meropenem  (MERREM ) 1 g in sodium chloride  0.9 % 100 mL IVPB  Status:  Discontinued        1 g 200 mL/hr over 30 Minutes Intravenous  Once 11/05/23 2043 11/05/23 2214        Subjective: Today, patient was seen and examined at bedside.  Indistinct speech.  Follows commands like lifting hands.  Difficult understand speech.  On mittens.  Cortrak tube tube in place.  Objective: Vitals:   11/09/23 0603 11/09/23 0700  BP: (!) 100/54 (!) 109/54  Pulse: 87 90  Resp: 16 15  Temp:  98 F (36.7 C)  SpO2: 97% 98%    Intake/Output Summary (Last 24 hours) at 11/09/2023 1001 Last data filed at 11/09/2023 0510 Gross per 24 hour  Intake 2691.87 ml  Output 1755 ml  Net 936.87 ml   Filed Weights   11/07/23 1800 11/08/23 0500 11/09/23 0259  Weight: 96.1 kg 96 kg 101 kg   Body mass index is 34.87 kg/m.   Physical Exam: GENERAL: Patient is alert awake  follows commands, disoriented and confused with indistinct speech. Not in obvious distress.  Obese build. HENT: No scleral pallor or icterus. Pupils equally reactive to light. Oral mucosa is moist NECK: is supple, no gross swelling noted. CHEST:   Diminished breath sounds bilaterally. CVS: S1 and S2 heard, no murmur. Regular rate and rhythm.  ABDOMEN: Soft, non-tender, bowel sounds are present. EXTREMITIES: No edema.  Bilateral upper extremity mittens in place CNS:  Indistinct speech.  Moves extremities.  Generalized weakness noted. SKIN: warm and dry without rashes.  Data Review: I have personally reviewed the following laboratory data and studies,  CBC: Recent Labs  Lab 11/05/23 1633 11/05/23 1640 11/06/23 0020 11/06/23 1133 11/07/23 0233 11/08/23 0525 11/08/23 2111 11/09/23 0345  WBC 7.2  --  7.5  --  4.5 3.4*  --  3.3*  NEUTROABS 2.5  --   --   --   --   --   --   --   HGB 10.2*   < > 9.1* 8.5* 8.9* 7.0* 8.3* 7.6*  HCT 32.3*   < > 28.4* 25.0* 25.9* 21.0* 24.9* 23.0*  MCV 109.9*  --  106.0*  --  102.8* 104.5*  --  102.2*  PLT 44*  --  40*  --  42* 30*  --  29*   < > = values in this interval not displayed.   Basic Metabolic Panel: Recent Labs  Lab 11/05/23 1633 11/05/23 1640 11/05/23 1641 11/06/23 0020 11/06/23 1133 11/07/23 0233 11/08/23 0411 11/08/23 2112 11/09/23 0345  NA 141   < > 141 140 142 141 142  --  139  K 5.1   < > 6.3* 3.4* 3.8 3.9 3.9  --  4.4  CL 108  --  111 107  --  110 109  --  105  CO2 19*  --   --  21*  --  23 27  --  26  GLUCOSE 135*  --  134* 141*  --  203* 168*  --  175*  BUN 57*  --  75* 47*  --  24* 32*  --  35*  CREATININE 3.03*  --  3.20* 2.17*  --  0.78 0.68  --  0.61  CALCIUM  8.6*  --   --  8.0*  --  8.4* 8.7*  --  8.9  MG 1.7  --   --  1.4*  --  2.2 1.5* 1.9 2.1  PHOS  --   --   --  4.0  --  2.7 1.5* 3.6 2.9   < > = values in this interval not displayed.   Liver Function Tests: Recent Labs  Lab 11/05/23 1633 11/07/23 0233  AST 81* 50*  ALT 47* 37  ALKPHOS 113 95  BILITOT 1.2 0.6  PROT 7.0 5.8*  ALBUMIN 2.8* 2.3*   No results for input(s): "LIPASE", "AMYLASE" in the last 168 hours. No results for input(s): "AMMONIA" in the last 168  hours. Cardiac Enzymes: No results for input(s): "CKTOTAL", "CKMB", "CKMBINDEX", "TROPONINI" in the last 168 hours. BNP (last 3 results) No results for input(s): "BNP" in the last 8760 hours.  ProBNP (last 3 results) No results for input(s): "PROBNP" in  the last 8760 hours.  CBG: Recent Labs  Lab 11/08/23 1555 11/08/23 2026 11/08/23 2321 11/09/23 0304 11/09/23 0751  GLUCAP 199* 216* 202* 169* 127*   Recent Results (from the past 240 hours)  Blood Culture (routine x 2)     Status: None (Preliminary result)   Collection Time: 11/05/23  4:33 PM   Specimen: BLOOD LEFT HAND  Result Value Ref Range Status   Specimen Description BLOOD LEFT HAND  Final   Special Requests   Final    BOTTLES DRAWN AEROBIC AND ANAEROBIC Blood Culture results may not be optimal due to an inadequate volume of blood received in culture bottles   Culture   Final    NO GROWTH 4 DAYS Performed at [facility], [address], [place], [place] [address]    Report Status PENDING  Incomplete  Blood Culture (routine x 2)     Status: None (Preliminary result)   Collection Time: 11/05/23  5:09 PM   Specimen: BLOOD  Result Value Ref Range Status   Specimen Description BLOOD SITE NOT SPECIFIED  Final   Special Requests   Final    BOTTLES DRAWN AEROBIC ONLY Blood Culture results may not be optimal due to an inadequate volume of blood received in culture bottles   Culture   Final    NO GROWTH 4 DAYS Performed at [facility], [address], [place], [place] [address]    Report Status PENDING  Incomplete  Resp panel by RT-PCR (RSV, Flu A&B, Covid) Anterior Nasal Swab     Status: None   Collection Time: 11/05/23  6:25 PM   Specimen: Anterior Nasal Swab  Result Value Ref Range Status   SARS Coronavirus 2 by RT PCR NEGATIVE NEGATIVE Final   Influenza A by PCR NEGATIVE NEGATIVE Final   Influenza B by PCR NEGATIVE NEGATIVE Final    Comment: (NOTE) The Xpert Xpress SARS-CoV-2/FLU/RSV plus assay is intended as an aid in the diagnosis of influenza from Nasopharyngeal swab specimens and should not be used as a sole basis for treatment. Nasal washings and aspirates are unacceptable for Xpert Xpress SARS-CoV-2/FLU/RSV testing.  Fact Sheet for  Patients: BloggerCourse.com  Fact Sheet for Healthcare Providers: SeriousBroker.it  This test is not yet approved or cleared by the [place]  FDA and has been authorized for detection and/or diagnosis of SARS-CoV-2 by FDA under an Emergency Use Authorization (EUA). This EUA will remain in effect (meaning this test can be used) for the duration of the COVID-19 declaration under Section 564(b)(1) of the Act, 21 U.S.C. section 360bbb-3(b)(1), unless the authorization is terminated or revoked.     Resp Syncytial Virus by PCR NEGATIVE NEGATIVE Final    Comment: (NOTE) Fact Sheet for Patients: BloggerCourse.com  Fact Sheet for Healthcare Providers: SeriousBroker.it  This test is not yet approved or cleared by the [place]  FDA and has been authorized for detection and/or diagnosis of SARS-CoV-2 by FDA under an Emergency Use Authorization (EUA). This EUA will remain in effect (meaning this test can be used) for the duration of the COVID-19 declaration under Section 564(b)(1) of the Act, 21 U.S.C. section 360bbb-3(b)(1), unless the authorization is terminated or revoked.  Performed at [facility], [address]  [place], [place] [address]   MRSA Next Gen by PCR, Nasal     Status: None   Collection Time: 11/06/23  3:47 AM   Specimen: Nasal Mucosa; Nasal Swab  Result Value Ref Range Status   MRSA by PCR Next Gen NOT DETECTED NOT DETECTED Final    Comment: (NOTE) The GeneXpert MRSA Assay (FDA approved for NASAL specimens only), is one component of a comprehensive MRSA colonization surveillance program. It is not intended to diagnose MRSA infection nor to guide or monitor treatment for MRSA infections. Test performance is not FDA approved in patients less than 62 years old. Performed at [facility], [address], [place], [place] [address]   Urine Culture (for  pregnant, neutropenic or urologic patients or patients with an indwelling urinary catheter)     Status: None   Collection Time: 11/07/23  9:18 AM   Specimen: Urine, Catheterized  Result Value Ref Range Status   Specimen Description URINE, CATHETERIZED  Final   Special Requests NONE  Final   Culture   Final    NO GROWTH Performed at [facility], [address] [place], [place] [address]    Report Status 11/08/2023 FINAL  Final     Studies: DG Foot 2 Views Left Result Date: 11/07/2023 CLINICAL DATA:  Foot pain and swelling EXAM: LEFT FOOT - 2 VIEW COMPARISON:  None Available. FINDINGS: Calcaneal spurring and tarsal degenerative changes are noted. No acute fracture or dislocation is noted. No rows of changes are seen. IMPRESSION: Degenerative change without acute abnormality. Electronically Signed   By: Violeta Grey M.D.   On: 11/07/2023 14:30      Dana Conradi, MD  [facility] 11/09/2023  If 7PM-7AM, please contact night-coverage

## 2023-11-10 DIAGNOSIS — Z66 Do not resuscitate: Secondary | ICD-10-CM | POA: Diagnosis not present

## 2023-11-10 DIAGNOSIS — Z515 Encounter for palliative care: Secondary | ICD-10-CM | POA: Diagnosis not present

## 2023-11-10 DIAGNOSIS — A419 Sepsis, unspecified organism: Secondary | ICD-10-CM | POA: Diagnosis not present

## 2023-11-10 DIAGNOSIS — Z7189 Other specified counseling: Secondary | ICD-10-CM

## 2023-11-10 DIAGNOSIS — E43 Unspecified severe protein-calorie malnutrition: Secondary | ICD-10-CM | POA: Diagnosis not present

## 2023-11-10 DIAGNOSIS — R4182 Altered mental status, unspecified: Secondary | ICD-10-CM | POA: Diagnosis not present

## 2023-11-10 LAB — BASIC METABOLIC PANEL WITH GFR
Anion gap: 7 (ref 5–15)
BUN: 32 mg/dL — ABNORMAL HIGH (ref 8–23)
CO2: 29 mmol/L (ref 22–32)
Calcium: 9 mg/dL (ref 8.9–10.3)
Chloride: 101 mmol/L (ref 98–111)
Creatinine, Ser: 0.56 mg/dL (ref 0.44–1.00)
GFR, Estimated: >60 mL/min (ref >60)
Glucose, Bld: 119 mg/dL — ABNORMAL HIGH (ref 70–99)
Potassium: 4 mmol/L (ref 3.5–5.1)
Sodium: 137 mmol/L (ref 135–145)

## 2023-11-10 LAB — GLUCOSE, CAPILLARY
Glucose-Capillary: 113 mg/dL — ABNORMAL HIGH (ref 70–99)
Glucose-Capillary: 121 mg/dL — ABNORMAL HIGH (ref 70–99)
Glucose-Capillary: 136 mg/dL — ABNORMAL HIGH (ref 70–99)
Glucose-Capillary: 162 mg/dL — ABNORMAL HIGH (ref 70–99)
Glucose-Capillary: 154 mg/dL — ABNORMAL HIGH (ref 70–99)

## 2023-11-10 LAB — CULTURE, BLOOD (ROUTINE X 2)
Culture: NO GROWTH
Culture: NO GROWTH

## 2023-11-10 LAB — CBC
HCT: 22.8 % — ABNORMAL LOW (ref 36.0–46.0)
Hemoglobin: 7.6 g/dL — ABNORMAL LOW (ref 12.0–15.0)
MCH: 34.1 pg — ABNORMAL HIGH (ref 26.0–34.0)
MCHC: 33.3 g/dL (ref 30.0–36.0)
MCV: 102.2 fL — ABNORMAL HIGH (ref 80.0–100.0)
Platelets: 31 10*3/uL — ABNORMAL LOW (ref 150–400)
RBC: 2.23 MIL/uL — ABNORMAL LOW (ref 3.87–5.11)
RDW: 22.2 % — ABNORMAL HIGH (ref 11.5–15.5)
WBC: 3.5 10*3/uL — ABNORMAL LOW (ref 4.0–10.5)
nRBC: 2.6 % — ABNORMAL HIGH (ref 0.0–0.2)

## 2023-11-10 LAB — MAGNESIUM: Magnesium: 1.6 mg/dL — ABNORMAL LOW (ref 1.7–2.4)

## 2023-11-10 MED ORDER — MAGNESIUM SULFATE 2 GM/50ML IV SOLN
2.0000 g | Freq: Once | INTRAVENOUS | Status: AC
Start: 2023-11-10 — End: 2023-11-10
  Administered 2023-11-10: 2 g via INTRAVENOUS
  Filled 2023-11-10: qty 50

## 2023-11-10 NOTE — Progress Notes (Signed)
 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  Patient was recently discharged from rehab facility on 4/14.  Patient was not taking her medications and food at home and had been bedbound.  According to husband, who was at the beside, she has been refusing her food and meds for several weeks.  In the ED, patient was hypotensive and required vasopressors.  Lactic acid was 2.2 initially.  Creatinine was 3.2 from baseline 0.7.Dana Morton  At this time, patient has been transferred out of the ICU.     Assessment/Plan: Principal Problem:   Sepsis (HCC) Active Problems:   Protein-calorie malnutrition, severe  Sepsis, secondary to UTI. Patient initially received vasopressors.  Foley catheter was changed.  Initially received vancomycin  and meropenem  due to history of ESBL.  Currently on IV meropenem .  Urine culture with no growth.  Blood cultures negative in 5 days.  Afebrile at this time with a temperature max of 98. F.  AKI on CKD Improved at this time.  Latest creatinine of 0.5.  Acute respiratory failure Currently on room air.  Continue to monitor closely.   Sacral decub, left unstageable present on admission, right buttocks stage II ulceration present on admission Pressure Injury 10/20/23 Buttocks Right Stage 2 -  Partial thickness loss of dermis presenting as a shallow open injury with a red, pink wound bed without slough. the area is pink in the right buttocks (Active)  10/20/23 1500  Location: Buttocks  Location Orientation: Right  Staging: Stage 2 -  Partial thickness loss of dermis presenting as a shallow open injury with a red, pink wound bed without slough.  Wound  Description (Comments): the area is pink in the right buttocks  Present on Admission: Yes     Pressure Injury 11/06/23 Buttocks Left Unstageable - Full thickness tissue loss in which the base of the injury is covered by slough (yellow, tan, gray, green or brown) and/or eschar (tan, brown or black) in the wound bed. Full tissue loss in which base  (Active)  11/06/23 0430  Location: Buttocks  Location Orientation: Left  Staging: Unstageable - Full thickness tissue loss in which the base of the injury is covered by slough (yellow, tan, gray, green or brown) and/or eschar (tan, brown or black) in the wound bed.  Wound Description (Comments): Full tissue loss in which base of injury covered by slough  Present on Admission: Yes    Wound care recommending surgical intervention and possible debridement in a nonurgent basis.   Acute metabolic encephalopathy H/o Seizures  History of glioblastoma s/p resection. Continue AEDs with Vimpat  and Keppra ..  Continue dexamethasone . On mittens, appears to be confused and disoriented.  Patient had episode of decreased responsiveness yesterday and rapid response was called in.  Appears to be intermittently lethargic.  More alert awake this morning but with aphasia and indistinct speech.  Following few commands.   Severe Calorie malnutrition, present on admission Body mass index is 37.57 kg/m.  Refeeding syndrome Nutrition Status: Nutrition Problem: Severe Malnutrition Etiology: acute illness (recurrent hospitalizations and rehab stays) Signs/Symptoms: severe muscle depletion, moderate fat depletion, percent weight loss (10.5% x 3 months) Percent weight loss: 10.5 % Interventions: Juven, MVI, Prostat, Tube feeding  On cortrak tube and tube feeding.  Currently on Osmolite 1.5.  And Prosource.   Anemia of chronic disease Thrombocytopenia Received 1 unit of packed RBC.  Continue to monitor closely.  Significant thrombocytopenia.  Latest hemoglobin of 7.6.    Left toe swelling Secondary to chronic gout.  Continue to monitor.  Hypothyroidism.  Continue Synthroid   Class I obesity.Body mass index is 37.57 kg/m.  Nutrition on board.  On tube feeding at this time.   Goals of care ICU provider had spoken with the patient's family regarding declining course with glioblastoma and multiple hospitalizations.  Palliative care on board at this time.  Had a prolonged discussion with patient's daughter and spouse yesterday regarding possible declining course.  At this time patient is a DO NOT RESUSCITATE DO NOT INTUBATE but family would like to continue to go through the course.  DVT prophylaxis: SCDs Start: 11/05/23 2151  Disposition: Likely to skilled nursing facility when improved.  Status is: Inpatient Remains inpatient appropriate because: Pending clinical improvement, cortrak tube tube feeding, encephalopathy    Code Status:     Code Status: Limited: Do not attempt resuscitation (DNR) -DNR-LIMITED -Do Not Intubate/DNI   Family Communication: Had a prolonged discussion with the patient's daughter and spouse 11/09/2023  Consultants: Palliative care PCCM General Surgery  Procedures: Cortrak tube tube feeding. EEG  Anti-infectives:  Meropenem  IV  Anti-infectives (From admission, onward)    Start     Dose/Rate Route Frequency Ordered Stop   11/07/23 2300  vancomycin  (VANCOREADY) IVPB 750 mg/150 mL  Status:  Discontinued        750 mg 150 mL/hr over 60 Minutes Intravenous Every 48 hours 11/05/23 2234 11/07/23 0825   11/07/23 1000  vancomycin  (VANCOREADY) IVPB 1250 mg/250 mL  Status:  Discontinued        1,250 mg 166.7 mL/hr over 90 Minutes Intravenous Every 24 hours 11/07/23 0825 11/07/23 0834   11/07/23 1000  Vancomycin  (VANCOCIN ) 1,250 mg in sodium chloride  0.9 % 250 mL IVPB  Status:  Discontinued        1,250 mg 166.7 mL/hr over 90 Minutes Intravenous Every 24 hours 11/07/23 0834 11/08/23 1433   11/07/23 0915  meropenem  (MERREM ) 1 g  in sodium chloride  0.9 % 100 mL IVPB        1 g 200 mL/hr over 30 Minutes Intravenous Every 8 hours 11/07/23 0820 11/09/23 2159   11/05/23 2330  meropenem  (MERREM ) 500 mg in sodium chloride  0.9 % 100 mL IVPB  Status:  Discontinued        500 mg 200 mL/hr over 30 Minutes Intravenous Every 12 hours 11/05/23 2153 11/07/23 0820   11/05/23 2215  vancomycin  (VANCOREADY) IVPB 2000 mg/400 mL        2,000 mg 200 mL/hr over 120 Minutes Intravenous  Once 11/05/23 2210 11/06/23 0045   11/05/23 2045  meropenem  (MERREM ) 1 g in sodium chloride  0.9 % 100 mL IVPB  Status:  Discontinued        1 g 200 mL/hr over 30 Minutes Intravenous  Once 11/05/23 2043 11/05/23 2214       Subjective: Today, patient was seen and examined at bedside.  Had episodes of somnolence and decreased responsiveness yesterday and rapid response was called in.  Subsequently patient had increased alertness.  Appears to be alert awake today with aphasia and indistinct speech.  Follows few commands.   On mittens.  Cortrak tube tube in place.  Objective: Vitals:   11/10/23 0654 11/10/23 0700  BP:  103/63  Pulse: 72 69  Resp: 19 17  Temp:  97.8 F (36.6 C)  SpO2: 96% 95%    Intake/Output Summary (Last 24 hours) at 11/10/2023 1100 Last data filed at 11/10/2023 0654 Gross per 24 hour  Intake 1239.17 ml  Output 2300 ml  Net -1060.83 ml   Filed Weights   11/08/23 0500 11/09/23 0259 11/10/23 0654  Weight: 96 kg 101 kg 108.8 kg   Body mass index is 37.57 kg/m.   Physical Exam: GENERAL: Patient is alert awake  follows commands, disoriented and confused with indistinct speech. Not in obvious distress.  Obese build.  Cortrak tube tube in place. HENT: No scleral pallor or icterus. Pupils equally reactive to light. Oral mucosa is moist NECK: is supple, no gross swelling noted. CHEST:   Diminished breath sounds bilaterally. CVS: S1 and S2 heard, no murmur. Regular rate and rhythm.  ABDOMEN: Soft, non-tender, bowel sounds are  present. EXTREMITIES: No edema.  Bilateral upper extremity mittens in place CNS: Indistinct speech.  Moves extremities.  Generalized weakness noted. SKIN: warm and dry, erythromatous  rash on the back noted on 11/09/2023, sacral ulcer       Data Review: I have personally reviewed the following laboratory data and studies,  CBC: Recent Labs  Lab 11/05/23 1633 11/05/23 1640 11/06/23 0020 11/06/23 1133 11/07/23 0233 11/08/23 0525 11/08/23 2111 11/09/23 0345 11/10/23 0447  WBC 7.2  --  7.5  --  4.5 3.4*  --  3.3* 3.5*  NEUTROABS 2.5  --   --   --   --   --   --   --   --   HGB 10.2*   < > 9.1*   < > 8.9* 7.0* 8.3* 7.6* 7.6*  HCT 32.3*   < > 28.4*   < > 25.9* 21.0* 24.9* 23.0* 22.8*  MCV 109.9*  --  106.0*  --  102.8* 104.5*  --  102.2* 102.2*  PLT 44*  --  40*  --  42* 30*  --  29* 31*   < > = values in this interval not displayed.   Basic Metabolic Panel: Recent Labs  Lab 11/06/23 0020 11/06/23 1133 11/07/23 0233 11/08/23 0411 11/08/23 2112 11/09/23 0345 11/10/23 0447  NA 140 142 141 142  --  139 137  K 3.4* 3.8 3.9 3.9  --  4.4 4.0  CL 107  --  110 109  --  105 101  CO2 21*  --  23 27  --  26 29  GLUCOSE 141*  --  203* 168*  --  175* 119*  BUN 47*  --  24* 32*  --  35* 32*  CREATININE 2.17*  --  0.78 0.68  --  0.61 0.56  CALCIUM  8.0*  --  8.4* 8.7*  --  8.9 9.0  MG 1.4*  --  2.2 1.5* 1.9 2.1 1.6*  PHOS 4.0  --  2.7 1.5* 3.6 2.9  --    Liver Function Tests: Recent Labs  Lab 11/05/23 1633 11/07/23 0233  AST 81* 50*  ALT 47* 37  ALKPHOS 113 95  BILITOT 1.2 0.6  PROT 7.0 5.8*  ALBUMIN 2.8* 2.3*   No results for input(s): "LIPASE", "AMYLASE" in the last 168 hours. No results for input(s): "AMMONIA" in the last 168 hours. Cardiac Enzymes: No results for input(s): "CKTOTAL", "CKMB", "CKMBINDEX", "TROPONINI" in the last 168 hours. BNP (last 3 results) No results for input(s): "BNP" in the last 8760 hours.  ProBNP (last  3 results) No results for input(s):  "PROBNP" in the last 8760 hours.  CBG: Recent Labs  Lab 11/09/23 1638 11/09/23 2020 11/09/23 2329 11/10/23 0415 11/10/23 0758  GLUCAP 180* 181* 142* 113* 121*   Recent Results (from the past 240 hours)  Blood Culture (routine x 2)     Status: None   Collection Time: 11/05/23  4:33 PM   Specimen: BLOOD LEFT HAND  Result Value Ref Range Status   Specimen Description BLOOD LEFT HAND  Final   Special Requests   Final    BOTTLES DRAWN AEROBIC AND ANAEROBIC Blood Culture results may not be optimal due to an inadequate volume of blood received in culture bottles   Culture   Final    NO GROWTH 5 DAYS Performed at [facility], [address] [place], [place] [address]    Report Status 11/10/2023 FINAL  Final  Blood Culture (routine x 2)     Status: None   Collection Time: 11/05/23  5:09 PM   Specimen: BLOOD  Result Value Ref Range Status   Specimen Description BLOOD SITE NOT SPECIFIED  Final   Special Requests   Final    BOTTLES DRAWN AEROBIC ONLY Blood Culture results may not be optimal due to an inadequate volume of blood received in culture bottles   Culture   Final    NO GROWTH 5 DAYS Performed at [facility], [address], [place], [place] [address]    Report Status 11/10/2023 FINAL  Final  Resp panel by RT-PCR (RSV, Flu A&B, Covid) Anterior Nasal Swab     Status: None   Collection Time: 11/05/23  6:25 PM   Specimen: Anterior Nasal Swab  Result Value Ref Range Status   SARS Coronavirus 2 by RT PCR NEGATIVE NEGATIVE Final   Influenza A by PCR NEGATIVE NEGATIVE Final   Influenza B by PCR NEGATIVE NEGATIVE Final    Comment: (NOTE) The Xpert Xpress SARS-CoV-2/FLU/RSV plus assay is intended as an aid in the diagnosis of influenza from Nasopharyngeal swab specimens and should not be used as a sole basis for treatment. Nasal washings and aspirates are unacceptable for Xpert Xpress SARS-CoV-2/FLU/RSV testing.  Fact Sheet for  Patients: BloggerCourse.com  Fact Sheet for Healthcare Providers: SeriousBroker.it  This test is not yet approved or cleared by the [place]  FDA and has been authorized for detection and/or diagnosis of SARS-CoV-2 by FDA under an Emergency Use Authorization (EUA). This EUA will remain in effect (meaning this test can be used) for the duration of the COVID-19 declaration under Section 564(b)(1) of the Act, 21 U.S.C. section 360bbb-3(b)(1), unless the authorization is terminated or revoked.     Resp Syncytial Virus by PCR NEGATIVE NEGATIVE Final    Comment: (NOTE) Fact Sheet for Patients: BloggerCourse.com  Fact Sheet for Healthcare Providers: SeriousBroker.it  This test is not yet approved or cleared by the [place]  FDA and has been authorized for detection and/or diagnosis of SARS-CoV-2 by FDA under an Emergency Use Authorization (EUA). This EUA will remain in effect (meaning this test can be used) for the duration of the COVID-19 declaration under Section 564(b)(1) of the Act, 21 U.S.C. section 360bbb-3(b)(1), unless the authorization is terminated or revoked.  Performed at [facility], [address] [place], [place] [address]   MRSA Next Gen by PCR, Nasal     Status: None   Collection Time: 11/06/23  3:47 AM   Specimen: Nasal Mucosa; Nasal Swab  Result Value  Ref Range Status   MRSA by PCR Next Gen NOT DETECTED NOT DETECTED Final    Comment: (NOTE) The GeneXpert MRSA Assay (FDA approved for NASAL specimens only), is one component of a comprehensive MRSA colonization surveillance program. It is not intended to diagnose MRSA infection nor to guide or monitor treatment for MRSA infections. Test performance is not FDA approved in patients less than 68 years old. Performed at [facility] Lab, [address], [place], [place] [address]   Urine Culture (for  pregnant, neutropenic or urologic patients or patients with an indwelling urinary catheter)     Status: None   Collection Time: 11/07/23  9:18 AM   Specimen: Urine, Catheterized  Result Value Ref Range Status   Specimen Description URINE, CATHETERIZED  Final   Special Requests NONE  Final   Culture   Final    NO GROWTH Performed at [facility], [address] [place], [place] [address]    Report Status 11/08/2023 FINAL  Final     Studies: CT HEAD WO CONTRAST ( ) Result Date: 11/09/2023 CLINICAL DATA:  Altered mental status.  History of glioblastoma. EXAM: CT HEAD WITHOUT CONTRAST TECHNIQUE: Contiguous axial images were obtained from the base of the skull through the vertex without intravenous contrast. RADIATION DOSE REDUCTION: This exam was performed according to the departmental dose-optimization program which includes automated exposure control, adjustment of the mA and/or kV according to patient size and/or use of iterative reconstruction technique. COMPARISON:  Head CT 11/05/2023 FINDINGS: Brain: A resection cavity and mild surrounding low-density in the left temporal lobe are unchanged. No acute infarct, intracranial hemorrhage, midline shift, or extra-axial fluid collection is identified. Patchy hypodensities elsewhere in the cerebral white matter bilaterally are unchanged and nonspecific but compatible with mild chronic small vessel ischemic disease. 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 There is mild cerebral atrophy. Vascular: No hyperdense vessel. Skull: Left temporoparietal craniotomy. Sinuses/Orbits: Minimal fluid or secretions in the right sphenoid sinus. Clear mastoid air cells. Unremarkable orbits. Other: None. IMPRESSION: 1. No evidence of acute intracranial abnormality. 2. Postoperative changes in the left temporal lobe. 3. Mild chronic small vessel ischemic disease. Electronically Signed   By: Aundra Lee M.D.   On: 11/09/2023 18:51   EEG  adult Result Date: 11/09/2023 Arleene Lack, MD     11/09/2023  4:00 PM Patient Name: Dana Morton MRN: [id number] Epilepsy Attending: Arleene Lack Referring Physician/Provider: Rosena Conradi, MD Date: 11/09/2023 Duration: 24.30 mins Patient history: 73yo F with h/o GBP s/p resection now with ams. EEG to evaluate with seizure.  Level of alertness: awake/ lethargic  AEDs during EEG study: LEV, LCM, Onfi   Technical aspects: This EEG study was done with scalp electrodes positioned according to the 10-20 International system of electrode placement. Electrical activity was reviewed with band pass filter of 1-70Hz , sensitivity of 7 uV/mm, display speed of 52mm/sec with a 60Hz  notched filter applied as appropriate. EEG data were recorded continuously and digitally stored.  Video monitoring was available and reviewed as appropriate.  Description: No clear posterior dominant rhythm was seen. EEG showed continuous generalized and maximal left temporal 5 to 6 Hz theta slowing admixed with intermittent 2-3hz  delta slowing. Sharp waves were noted in left temporo-parietal region. Hyperventilation and photic stimulation were not performed.    ABNORMALITY - Sharp waves, left temporo-parietal region. - Continuous slow, generalized and maximal left temporal  IMPRESSION: This study is consistent with patient's history of epilepsy  arising from left temporo-parietal region. Additionally there is cortical dysfunction arising from left temporal region, likely secondary to underlying structural abnormality. Lastly there is mild to moderate diffuse encephalopathy. No seizures were seen throughout the recording.  Priyanka O Yadav      Plumer Mittelstaedt, MD  [facility] 11/10/2023  If 7PM-7AM, please contact night-coverage

## 2023-11-10 NOTE — Progress Notes (Signed)
 Assessed vesicular rash on pt's left hip, picture obtained, Mansy, MD notified, pt placed on Airborne precautions.   Sonjia Durie, RN

## 2023-11-10 NOTE — Progress Notes (Signed)
 Speech Language Pathology Treatment: Dysphagia  Patient Details Name: GOLDIE STEMPER MRN: [id number] DOB: 1951-07-13 Today's Date: 11/10/2023 Time: 4098-1191 SLP Time Calculation (min) (ACUTE ONLY): 21 min  Assessment / Plan / Recommendation Clinical Impression  Ms. Kao was sleepy but interactive. Followed commands intermittently and engaged in social communication; self-generated speech was aphasic (baseline).  Oral cavity was clean upon examination.  She accepted sips of water with notable difficulty sipping from a straw.  When she was successful at retrieving water, it was followed by an immediate/palpable swallow and no s/s of aspiration.  Applesauce was held in her mouth - she generated intermittent initiation to manipulate and swallow, but most of the material remained and oral suctioning was required for its removal.  Recommend offering water at regular intervals; raise [facility] and ensure she is alert.  She may use a straw if able. MS is primary barrier to resuming a PO diet. SLP will follow. D/W RN.    HPI HPI: Pt is a 73 y/o female who was admitted 4/24 secondary to failure to thrive;  noted to not want to eat or drink or take her meds. Cortrak placed 4/25. CT head negative. EEG 4/27: no seizures; cortical dysfunction from left temporal region, likely secondary to underlying structural abnormality; mild to moderate diffuse encephalopathy. Pt recently admitted to [facility] 4/8 for fall from bed at the rehab/nursing facility and discharged on 4/14. 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 BSE 08/07/23: cognitively based dysphagia; a dysphagia 3 diet started at that time with consideration of pt's mentation and per family's preference.      SLP Plan  Continue with current plan of care      Recommendations for follow up therapy are one component of a  multi-disciplinary discharge planning process, led by the attending physician.  Recommendations may be updated based on patient status, additional functional criteria and insurance authorization.    Recommendations  Diet recommendations: Other(comment) (sips of water) Liquids provided via: Straw;Teaspoon;Cup Medication Administration: Via alternative means Supervision: Trained caregiver to feed patient Compensations: Slow rate;Small sips/bites                  Oral care prior to ice chip/H20   Frequent or constant Supervision/Assistance Dysphagia, oropharyngeal phase (R13.12)     Continue with current plan of care    Carlyn Lemke L. Beatris Lincoln, MA CCC/SLP Clinical Specialist - Acute Care SLP Acute Rehabilitation Services Office number [phone]  [name]  11/10/2023, 10:20 AM

## 2023-11-10 NOTE — TOC Progression Note (Signed)
 Transition of Care [facility]) - Progression Note    Patient Details  Name: AMERICA CALICA MRN: [id number] Date of Birth: 1951/02/03  Transition of [facility]) CM/SW Contact  Juliane Och, LCSW Phone Number: 11/10/2023, 12:51 PM  Clinical Narrative:     12:53 PM Per chart review, Palliative Care team is to conduct GOC meeting with patient and patient's family tomorrow.  Expected Discharge Plan: Skilled Nursing Facility Barriers to Discharge: Continued Medical Work up  Expected Discharge Plan and Services In-house Referral: Clinical Social Work     Living arrangements for the past 2 months: Skilled Holiday representative, Single Family Home                                       Social Determinants of Health (SDOH) Interventions SDOH Screenings   Food Insecurity: No Food Insecurity (11/07/2023)  Housing: Low Risk  (11/07/2023)  Transportation Needs: Patient Unable To Answer (11/07/2023)  Utilities: Patient Unable To Answer (11/07/2023)  Financial Resource Strain: Low Risk  (10/03/2023)   Received from [facility]  Social Connections: Unknown (11/09/2023)  Tobacco Use: Low Risk  (11/05/2023)    Readmission Risk Interventions     No data to display

## 2023-11-10 NOTE — Progress Notes (Signed)
  Palliative Medicine Inpatient Follow Up Note HPI: 73 y/o female with PMH for Glioblastoma (causing aphasia and word finding) s/p resection 05/2023 at [facility], Seizure d/o secondary to tumor/radiation (mostly Absense type seizures), HTN, Hypothyroidism who was admitted to [facility] 4/8 for fall from bed at the rehab/nursing facility where she lives since her resection. She was d/c to facility 4/14 from [facility] after being treated for sepsis, UTI and she had a stage III sacral decubiti. She has a chronic Foley.    Palliative care has been asked to support additional goals of care conversations.   Today's Discussion 11/10/2023  *Please note that this is a verbal dictation therefore any spelling or grammatical errors are due to the "Dragon Medical One" system interpretation.  Chart reviewed inclusive of vital signs, progress notes, laboratory results, and diagnostic images. Patient assessed at the bedside while receiving care from RN. RN shared that she is not interactive at the present moment and husband planned to return by lunch. No family present during my visit.  Called patient's husband [name] for ongoing palliative support. He was concerned that Shalanda had already passed away and seems to understand overall trajectory is approaching EOL. Reassurance was provided. He is quite exhausted today after spending a long time at the hospital. He plans to discuss with patient's daughter and inform PMT of preferred time for tentative family meeting tomorrow 4/30. Emotional support and therapeutic listening was provided.  Questions and concerns addressed/Palliative Support Provided.   Objective Assessment: Vital Signs Vitals:   11/10/23 0654 11/10/23 0700  BP:  103/63  Pulse: 72 69  Resp: 19 17  Temp:  97.8 F (36.6 C)  SpO2: 96% 95%    Gen:  Elderly Caucasian F chronically ill appearing HEENT: Coretrack, Dry mucous membranes CV: Regular rate and rhythm  PULM:  On RA, breathing is even and nonlabored ABD:  soft/nontender/nondistended  EXT: (+) edema  Neuro: Oriented to self, expressive aphasia  SUMMARY OF RECOMMENDATIONS   DNAR/DNI escalation of care to the ICU for pressors/antiarrhythmics/bipap if needed   Continue present care at this time  Plan for another family meeting on Wednesday - husband will call PMT with preferred time  Ongoing PMT support  Time Spent: 35 minutes  Shawndrea Rutkowski, PA-C [place] Palliative Medicine Team Team Cell Phone: [phone] Please utilize secure chat with additional questions, if there is no response within 30 minutes please call the above phone number  Palliative Medicine Team providers are available by phone from 7am to 7pm daily and can be reached through the team cell phone.  Should this patient require assistance outside of these hours, please call the patient's attending physician.

## 2023-11-11 DIAGNOSIS — Z515 Encounter for palliative care: Secondary | ICD-10-CM | POA: Diagnosis not present

## 2023-11-11 DIAGNOSIS — Z66 Do not resuscitate: Secondary | ICD-10-CM | POA: Diagnosis not present

## 2023-11-11 DIAGNOSIS — Z7189 Other specified counseling: Secondary | ICD-10-CM | POA: Diagnosis not present

## 2023-11-11 LAB — GLUCOSE, CAPILLARY
Glucose-Capillary: 109 mg/dL — ABNORMAL HIGH (ref 70–99)
Glucose-Capillary: 126 mg/dL — ABNORMAL HIGH (ref 70–99)
Glucose-Capillary: 137 mg/dL — ABNORMAL HIGH (ref 70–99)
Glucose-Capillary: 163 mg/dL — ABNORMAL HIGH (ref 70–99)
Glucose-Capillary: 172 mg/dL — ABNORMAL HIGH (ref 70–99)

## 2023-11-11 MED ORDER — MAGNESIUM SULFATE 2 GM/50ML IV SOLN
2.0000 g | Freq: Once | INTRAVENOUS | Status: AC
  Administered 2023-11-11: 2 g via INTRAVENOUS
  Filled 2023-11-11: qty 50

## 2023-11-11 MED ORDER — OSMOLITE 1.5 CAL PO LIQD
996.0000 mL | ORAL | Status: DC
  Administered 2023-11-11 – 2023-11-21 (×11): 996 mL
  Filled 2023-11-11 (×2): qty 1000

## 2023-11-11 MED ORDER — PROSOURCE TF20 ENFIT COMPATIBL EN LIQD
60.0000 mL | Freq: Two times a day (BID) | ENTERAL | Status: DC
  Administered 2023-11-11 – 2023-11-22 (×22): 60 mL
  Filled 2023-11-11 (×22): qty 60

## 2023-11-11 MED ORDER — ENSURE ENLIVE PO LIQD: 237.0000 mL | Freq: Three times a day (TID) | ORAL | Status: DC

## 2023-11-11 MED ORDER — VALACYCLOVIR HCL 500 MG PO TABS
1000.0000 mg | ORAL_TABLET | Freq: Three times a day (TID) | ORAL | Status: AC
  Administered 2023-11-11 – 2023-11-17 (×19): 1000 mg
  Filled 2023-11-11 (×20): qty 2

## 2023-11-11 MED ORDER — VALACYCLOVIR HCL 500 MG PO TABS
1000.0000 mg | ORAL_TABLET | Freq: Three times a day (TID) | ORAL | Status: DC
  Filled 2023-11-11 (×3): qty 2

## 2023-11-11 NOTE — Progress Notes (Signed)
 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 She was d/c to facility 4/14 from [facility] after being treated for sepsis, UTI and she had a stage III sacral decubiti. She has a chronic Foley.    Palliative care has been asked to support additional goals of care conversations.   Today's Discussion 11/11/2023  *Please note that this is a verbal dictation therefore any spelling or grammatical errors are due to the "Dragon Medical One" system interpretation.  Chart reviewed inclusive of vital signs, progress notes, laboratory results, and diagnostic images.   I met with Dana Morton this morning - she requested to get on the bed pan and out of bed. I was able to alert the unit secretary to these requests.  Dr. Pokhrel was able to given patients family a medical update. He shares his concern that we may fix one medical concern though others will follow and each hospitalization or issue lead patient to a greater deficit.   I met with Dana Morton, her husband, Dana Morton, her best friend, Dana Morton,  her daughter, Dana Morton on speaker phone and her son, Dana Morton on speaker phone.   We reviewed the impact that this prolonged hospitalization has had on Dana Morton but more so how difficult her course has been since receiving both chemotherapy and radiation therapy.  Patient's best friend shares openly and honestly with family that each time Dana Morton is admitted to the hospital she loses ground and that we have been artificially keeping her alive with the core track feedings as well as her medications which are elevating her blood pressures.  We discussed the alternatives to aggressive medical care inclusive of keeping Dana Morton  comfortable and at peace.  I shared openly and honestly that this would include removing the core track feeding and only giving her medicines to alleviate pain, shortness of breath, nausea, itching, and generalized distress.  I shared that often this care can be started in the hospital and patient's transition either home with hospice or to a hospice facility.  Patient's daughter notes that she is having tremendous difficulty with this decision.  Family deliberated and at this point in time Dana Morton is not ready to stop core track feeding as she would like additional family to come and see Dana Morton.  We did review that Dana Morton cannot live indefinitely on core track feeding and that patient to stop eating and drinking often do so because they do not want to.  Patient's husband Dana Morton and good friend noted her not wanting to eat or drink in their presence.  They also shared concerns over the pain associated with the core track and how Dana Morton wants to dislodge this continuously.  At this point in time patient's family want to hold off on additional decisions and take some time to think and pray about what to do next.  I shared we would reconvene in the oncoming days for further conversations.  Dana Morton is aware that unfortunately the decision may ultimately come to him and he himself favors allowing Dana Morton to be comfortable and at peace.  Questions and concerns addressed/Palliative Support Provided.   Objective Assessment: Vital Signs Vitals:   11/11/23 1100 11/11/23 1623  BP: (!) 129/59 (!) 155/63  Pulse: 98   Resp: 15 17  Temp: 98.4 F (36.9 C) 98 F (36.7 C)  SpO2: 95% 94%    Intake/Output Summary (Last 24 hours) at 11/11/2023 1802 Last data filed at 11/11/2023 1100 Gross per 24 hour  Intake 40 ml  Output 1960 ml  Net -1920 ml   Last Weight  Most recent update: 11/11/2023  5:05 AM    Weight  103 kg (227 lb)            Gen:  Elderly Caucasian F chronically ill appearing HEENT: Coretrack,  Dry mucous membranes CV: Regular rate and rhythm  PULM:  On RA, breathing is even and nonlabored ABD: soft/nontender/nondistended  EXT: (+) edema  Neuro: Oriented to self, expressive aphasia  SUMMARY OF RECOMMENDATIONS   DNAR/DNI escalation of care to the ICU for pressors/antiarrhythmics/bipap if needed   Patient's family - daughter is struggling with the idea of end-of-life care for Dana Morton  Patient's family plan to take a few days to consider their options  Palliative care will reconvene with Jazzmyne's husband on Friday to gain better insight somewhat direction to travel with her care  Ongoing PMT support  Time Spent: 86 minutes Billing based on MDM: High ______________________________________________________________________________________ [place] [place] Palliative Medicine Team Team Cell Phone: [phone] Please utilize secure chat with additional questions, if there is no response within 30 minutes please call the above phone number  Palliative Medicine Team providers are available by phone from 7am to 7pm daily and can be reached through the team cell phone.  Should this patient require assistance outside of these hours, please call the patient's attending physician.

## 2023-11-11 NOTE — Progress Notes (Addendum)
 PROGRESS NOTE  Dana Morton YNW:[id number] DOB: 03/08/1951 DOA: 11/05/2023 PCP: Melchor Spoon, MD   LOS: 6 days   Brief narrative:  73 y/o female with with past medical history of glioblastoma  s/p resection 05/2023 at [facility], Seizure disorder secondary to tumor/radiation , HTN, Hypothyroidism presented to the hospital skilled nursing facility after sustaining a fall.  Patient was recently discharged from rehab facility on 4/14.  Patient was not taking her medications and food at home and had been bedbound.  According to husband, who was at the beside, she has been refusing her food and meds for several weeks.  In the ED, patient was hypotensive and required vasopressors.  Lactic acid was 2.2 initially.  Creatinine was 3.2 from baseline 0.7.[name]  At this time, patient has been transferred out of the ICU.     Assessment/Plan: Principal Problem:   Sepsis (HCC) Active Problems:   Protein-calorie malnutrition, severe  Sepsis with septic shock secondary to UTI. Patient initially received vasopressors.  Foley catheter was changed.  Initially received vancomycin  and meropenem  due to history of ESBL.  Subsequently was on meropenem  and has completed the course.  Urine culture with no growth.  Blood cultures negative in 5 days.  Afebrile at this time with a temperature max of 98.4. F.  AKI on CKD Improved at this time.  Latest creatinine of 0.5.  Acute respiratory failure Currently on room air.  Continue to monitor closely.   Sacral decub, left unstageable present on admission, right buttocks stage II ulceration present on admission Pressure Injury 10/20/23 Buttocks Right Stage 2 -  Partial thickness loss of dermis presenting as a shallow open injury with a red, pink wound bed without slough. the area is pink in the right buttocks (Active)  10/20/23 1500  Location: Buttocks  Location Orientation: Right  Staging: Stage 2 -  Partial thickness loss of dermis presenting as a shallow open injury  with a red, pink wound bed without slough.  Wound Description (Comments): the area is pink in the right buttocks  Present on Admission: Yes     Pressure Injury 11/06/23 Buttocks Left Unstageable - Full thickness tissue loss in which the base of the injury is covered by slough (yellow, tan, gray, green or brown) and/or eschar (tan, brown or black) in the wound bed. Full tissue loss in which base  (Active)  11/06/23 0430  Location: Buttocks  Location Orientation: Left  Staging: Unstageable - Full thickness tissue loss in which the base of the injury is covered by slough (yellow, tan, gray, green or brown) and/or eschar (tan, brown or black) in the wound bed.  Wound Description (Comments): Full tissue loss in which base of injury covered by slough  Present on Admission: Yes    Wound care recommending surgical intervention and possible debridement in a nonurgent basis.   Acute metabolic encephalopathy H/o Seizures  History of glioblastoma s/p resection. Continue AEDs with Vimpat  and Keppra ..  Continue dexamethasone . On mittens, appears to be alert awake but difficult to understanding speech.    Severe Calorie malnutrition, present on admission Body mass index is 35.55 kg/m.  Refeeding syndrome Nutrition Status: Nutrition Problem: Severe Malnutrition Etiology: acute illness (recurrent hospitalizations and rehab stays) Signs/Symptoms: severe muscle depletion, moderate fat depletion, percent weight loss (10.5% x 3 months) Percent weight loss: 10.5 % Interventions: Juven, MVI, Prostat, Tube feeding   On cortrak tube and tube feeding.  Currently on Osmolite 1.5 and Prosource.  Hypomagnesemia.  Magnesium  of 1.6.  Will  replace with 2 g of IV magnesium  sulfate.  Check magnesium  level in AM.   Anemia of chronic disease Thrombocytopenia Received 1 unit of packed RBC.  Continue to monitor closely.  Significant thrombocytopenia with platelet count of 31..  Latest hemoglobin of 7.6.   Left toe  swelling Secondary to chronic gout.  Continue to monitor.  Hypothyroidism.  Continue Synthroid   Class I obesity.Body mass index is 35.55 kg/m.  Nutrition on board.  On tube feeding at this time.   Goals of care ICU provider had spoken with the patient's family regarding declining course with glioblastoma and multiple hospitalizations.  I also had prolonged discussion with patient's daughter and spouse on 11/09/2023.  Palliative care on board at this time.  At this time patient is a DO NOT RESUSCITATE DO NOT INTUBATE but family would like to continue to go through the course.  Plan for further meeting with palliative care.  Concern for Herpes Zoster Will start Valtrex 1000 g 3 times daily for 7 days.  On airborne isolation precautions.  DVT prophylaxis: SCDs Start: 11/05/23 2151  Disposition: Likely to skilled nursing facility when improved.  Continuing ongoing goals of care discussion.  Status is: Inpatient Remains inpatient appropriate because: Pending clinical improvement, cortrak tube tube feeding, encephalopathy    Code Status:     Code Status: Limited: Do not attempt resuscitation (DNR) -DNR-LIMITED -Do Not Intubate/DNI   Family Communication: Had a prolonged discussion with the patient's daughter and spouse 11/09/2023  Consultants: Palliative care PCCM General Surgery  Procedures: Cortrak tube tube feeding. EEG  Anti-infectives:  Meropenem  IV completed Valtrex.  Anti-infectives (From admission, onward)    Start     Dose/Rate Route Frequency Ordered Stop   11/11/23 1200  valACYclovir (VALTREX) tablet 1,000 mg        1,000 mg Oral 3 times daily 11/11/23 1107 11/18/23 0959   11/07/23 2300  vancomycin  (VANCOREADY) IVPB 750 mg/150 mL  Status:  Discontinued        750 mg 150 mL/hr over 60 Minutes Intravenous Every 48 hours 11/05/23 2234 11/07/23 0825   11/07/23 1000  vancomycin  (VANCOREADY) IVPB 1250 mg/250 mL  Status:  Discontinued        1,250 mg 166.7 mL/hr over 90  Minutes Intravenous Every 24 hours 11/07/23 0825 11/07/23 0834   11/07/23 1000  Vancomycin  (VANCOCIN ) 1,250 mg in sodium chloride  0.9 % 250 mL IVPB  Status:  Discontinued        1,250 mg 166.7 mL/hr over 90 Minutes Intravenous Every 24 hours 11/07/23 0834 11/08/23 1433   11/07/23 0915  meropenem  (MERREM ) 1 g in sodium chloride  0.9 % 100 mL IVPB        1 g 200 mL/hr over 30 Minutes Intravenous Every 8 hours 11/07/23 0820 11/09/23 2159   11/05/23 2330  meropenem  (MERREM ) 500 mg in sodium chloride  0.9 % 100 mL IVPB  Status:  Discontinued        500 mg 200 mL/hr over 30 Minutes Intravenous Every 12 hours 11/05/23 2153 11/07/23 0820   11/05/23 2215  vancomycin  (VANCOREADY) IVPB 2000 mg/400 mL        2,000 mg 200 mL/hr over 120 Minutes Intravenous  Once 11/05/23 2210 11/06/23 0045   11/05/23 2045  meropenem  (MERREM ) 1 g in sodium chloride  0.9 % 100 mL IVPB  Status:  Discontinued        1 g 200 mL/hr over 30 Minutes Intravenous  Once 11/05/23 2043 11/05/23 2214  Subjective:  Today, patient was seen and examined at bedside.  Appears to be more alert awake and mildly interactive but difficult to understand speech.  No interval complaints reported  Objective: Vitals:   11/11/23 0817 11/11/23 1100  BP: 130/67 (!) 129/59  Pulse: 92 98  Resp: 17 15  Temp: 98.4 F (36.9 C) 98.4 F (36.9 C)  SpO2: 94% 95%    Intake/Output Summary (Last 24 hours) at 11/11/2023 1141 Last data filed at 11/10/2023 2106 Gross per 24 hour  Intake 40 ml  Output 2260 ml  Net -2220 ml   Filed Weights   11/09/23 0259 11/10/23 0654 11/11/23 0505  Weight: 101 kg 108.8 kg 103 kg   Body mass index is 35.55 kg/m.   Physical Exam: GENERAL: Patient is alert awake, indistinctly speech, mildly interactive,. Not in obvious distress.  Obese build.  Cortrak tube tube in place. HENT: No scleral pallor or icterus. Pupils equally reactive to light. Oral mucosa is moist NECK: is supple, no gross swelling  noted. CHEST:   Diminished breath sounds bilaterally. CVS: S1 and S2 heard, no murmur. Regular rate and rhythm.  ABDOMEN: Soft, non-tender, bowel sounds are present. EXTREMITIES: No edema.  Mittens in place. CNS: Indistinct speech.  Moves extremities.  Generalized weakness noted. SKIN: warm and dry, erythromatous  rash on the back noted on 11/09/2023, sacral ulcer       Data Review: I have personally reviewed the following laboratory data and studies,  CBC: Recent Labs  Lab 11/05/23 1633 11/05/23 1640 11/06/23 0020 11/06/23 1133 11/07/23 0233 11/08/23 0525 11/08/23 2111 11/09/23 0345 11/10/23 0447  WBC 7.2  --  7.5  --  4.5 3.4*  --  3.3* 3.5*  NEUTROABS 2.5  --   --   --   --   --   --   --   --   HGB 10.2*   < > 9.1*   < > 8.9* 7.0* 8.3* 7.6* 7.6*  HCT 32.3*   < > 28.4*   < > 25.9* 21.0* 24.9* 23.0* 22.8*  MCV 109.9*  --  106.0*  --  102.8* 104.5*  --  102.2* 102.2*  PLT 44*  --  40*  --  42* 30*  --  29* 31*   < > = values in this interval not displayed.   Basic Metabolic Panel: Recent Labs  Lab 11/06/23 0020 11/06/23 1133 11/07/23 0233 11/08/23 0411 11/08/23 2112 11/09/23 0345 11/10/23 0447  NA 140 142 141 142  --  139 137  K 3.4* 3.8 3.9 3.9  --  4.4 4.0  CL 107  --  110 109  --  105 101  CO2 21*  --  23 27  --  26 29  GLUCOSE 141*  --  203* 168*  --  175* 119*  BUN 47*  --  24* 32*  --  35* 32*  CREATININE 2.17*  --  0.78 0.68  --  0.61 0.56  CALCIUM  8.0*  --  8.4* 8.7*  --  8.9 9.0  MG 1.4*  --  2.2 1.5* 1.9 2.1 1.6*  PHOS 4.0  --  2.7 1.5* 3.6 2.9  --    Liver Function Tests: Recent Labs  Lab 11/05/23 1633 11/07/23 0233  AST 81* 50*  ALT 47* 37  ALKPHOS 113 95  BILITOT 1.2 0.6  PROT 7.0 5.8*  ALBUMIN 2.8* 2.3*   No results for input(s): "LIPASE", "AMYLASE" in the last 168 hours. No results for input(s): "  AMMONIA" in the last 168 hours. Cardiac Enzymes: No results for input(s): "CKTOTAL", "CKMB", "CKMBINDEX", "TROPONINI" in the last 168  hours. BNP (last 3 results) No results for input(s): "BNP" in the last 8760 hours.  ProBNP (last 3 results) No results for input(s): "PROBNP" in the last 8760 hours.  CBG: Recent Labs  Lab 11/10/23 2045 11/11/23 0015 11/11/23 0448 11/11/23 0811 11/11/23 1138  GLUCAP 154* 163* 137* 126* 172*   Recent Results (from the past 240 hours)  Blood Culture (routine x 2)     Status: None   Collection Time: 11/05/23  4:33 PM   Specimen: BLOOD LEFT HAND  Result Value Ref Range Status   Specimen Description BLOOD LEFT HAND  Final   Special Requests   Final    BOTTLES DRAWN AEROBIC AND ANAEROBIC Blood Culture results may not be optimal due to an inadequate volume of blood received in culture bottles   Culture   Final    NO GROWTH 5 DAYS Performed at [facility], [address], [place], [place] [address]    Report Status 11/10/2023 FINAL  Final  Blood Culture (routine x 2)     Status: None   Collection Time: 11/05/23  5:09 PM   Specimen: BLOOD  Result Value Ref Range Status   Specimen Description BLOOD SITE NOT SPECIFIED  Final   Special Requests   Final    BOTTLES DRAWN AEROBIC ONLY Blood Culture results may not be optimal due to an inadequate volume of blood received in culture bottles   Culture   Final    NO GROWTH 5 DAYS Performed at [facility], [address], [place], [place] [address]    Report Status 11/10/2023 FINAL  Final  Resp panel by RT-PCR (RSV, Flu A&B, Covid) Anterior Nasal Swab     Status: None   Collection Time: 11/05/23  6:25 PM   Specimen: Anterior Nasal Swab  Result Value Ref Range Status   SARS Coronavirus 2 by RT PCR NEGATIVE NEGATIVE Final   Influenza A by PCR NEGATIVE NEGATIVE Final   Influenza B by PCR NEGATIVE NEGATIVE Final    Comment: (NOTE) The Xpert Xpress SARS-CoV-2/FLU/RSV plus assay is intended as an aid in the diagnosis of influenza from Nasopharyngeal swab specimens and should not be used as a sole basis for treatment. Nasal  washings and aspirates are unacceptable for Xpert Xpress SARS-CoV-2/FLU/RSV testing.  Fact Sheet for Patients: BloggerCourse.com  Fact Sheet for Healthcare Providers: SeriousBroker.it  This test is not yet approved or cleared by the [place]  FDA and has been authorized for detection and/or diagnosis of SARS-CoV-2 by FDA under an Emergency Use Authorization (EUA). This EUA will remain in effect (meaning this test can be used) for the duration of the COVID-19 declaration under Section 564(b)(1) of the Act, 21 U.S.C. section 360bbb-3(b)(1), unless the authorization is terminated or revoked.     Resp Syncytial Virus by PCR NEGATIVE NEGATIVE Final    Comment: (NOTE) Fact Sheet for Patients: BloggerCourse.com  Fact Sheet for Healthcare Providers: SeriousBroker.it  This test is not yet approved or cleared by the [place]  FDA and has been authorized for detection and/or diagnosis of SARS-CoV-2 by FDA under an Emergency Use Authorization (EUA). This EUA will remain in effect (meaning this test can be used) for the duration of the COVID-19 declaration under Section 564(b)(1) of the Act, 21 U.S.C. section 360bbb-3(b)(1), unless the authorization is terminated or revoked.  Performed at [facility], [address] [place], [place] [address]   MRSA Next Gen by PCR, Nasal     Status: None   Collection Time: 11/06/23  3:47 AM   Specimen: Nasal Mucosa; Nasal Swab  Result Value Ref Range Status   MRSA by PCR Next Gen NOT DETECTED NOT DETECTED Final    Comment: (NOTE) The GeneXpert MRSA Assay (FDA approved for NASAL specimens only), is one component of a comprehensive MRSA colonization surveillance program. It is not intended to diagnose MRSA infection nor to guide or monitor treatment for MRSA infections. Test performance is not FDA approved in patients less than 42  years old. Performed at [facility], [address] [place], [place] [address]   Urine Culture (for pregnant, neutropenic or urologic patients or patients with an indwelling urinary catheter)     Status: None   Collection Time: 11/07/23  9:18 AM   Specimen: Urine, Catheterized  Result Value Ref Range Status   Specimen Description URINE, CATHETERIZED  Final   Special Requests NONE  Final   Culture   Final    NO GROWTH Performed at [facility], [address], [place], [place] [address]    Report Status 11/08/2023 FINAL  Final     Studies: CT HEAD WO CONTRAST ( ) Result Date: 11/09/2023 CLINICAL DATA:  Altered mental status.  History of glioblastoma. EXAM: CT HEAD WITHOUT CONTRAST TECHNIQUE: Contiguous axial images were obtained from the base of the skull through the vertex without intravenous contrast. RADIATION DOSE REDUCTION: This exam was performed according to the departmental dose-optimization program which includes automated exposure control, adjustment of the mA and/or kV according to patient size and/or use of iterative reconstruction technique. COMPARISON:  Head CT 11/05/2023 FINDINGS: Brain: A resection cavity and mild surrounding low-density in the left temporal lobe are unchanged. No acute infarct, intracranial hemorrhage, midline shift, or extra-axial fluid collection is identified. Patchy hypodensities elsewhere in the cerebral white matter bilaterally are unchanged and nonspecific but compatible with mild chronic small vessel ischemic disease. 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 There is mild cerebral atrophy. Vascular: No hyperdense vessel. Skull: Left temporoparietal craniotomy. Sinuses/Orbits: Minimal fluid or secretions in the right sphenoid sinus. Clear mastoid air cells. Unremarkable orbits. Other: None. IMPRESSION: 1. No evidence of acute intracranial abnormality. 2. Postoperative changes in the left temporal lobe. 3. Mild  chronic small vessel ischemic disease. Electronically Signed   By: Aundra Lee M.D.   On: 11/09/2023 18:51   EEG adult Result Date: 11/09/2023 Arleene Lack, MD     11/09/2023  4:00 PM Patient Name: KAHLEAH GARFINKEL MRN: [id number] Epilepsy Attending: Arleene Lack Referring Physician/Provider: Rosena Conradi, MD Date: 11/09/2023 Duration: 24.30 mins Patient history: 73yo F with h/o GBP s/p resection now with ams. EEG to evaluate with seizure.  Level of alertness: awake/ lethargic  AEDs during EEG study: LEV, LCM, Onfi   Technical aspects: This EEG study was done with scalp electrodes positioned according to the 10-20 International system of electrode placement. Electrical activity was reviewed with band pass filter of 1-70Hz , sensitivity of 7 uV/mm, display speed of 33mm/sec with a 60Hz  notched filter applied as appropriate. EEG data were recorded continuously and digitally stored.  Video monitoring was available and reviewed as appropriate.  Description: No clear posterior dominant rhythm was seen. EEG showed continuous generalized and maximal left temporal 5 to 6 Hz theta slowing admixed with intermittent 2-3hz  delta slowing. Sharp waves were noted in left  temporo-parietal region. Hyperventilation and photic stimulation were not performed.    ABNORMALITY - Sharp waves, left temporo-parietal region. 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 Additionally there is cortical dysfunction arising from left temporal region, likely secondary to underlying structural abnormality. Lastly there is mild to moderate diffuse encephalopathy. No seizures were seen throughout the recording.  Priyanka O Yadav      Jacynda Brunke, MD  [facility] 11/11/2023  If 7PM-7AM, please contact night-coverage

## 2023-11-11 NOTE — Progress Notes (Signed)
 Speech Language Pathology Treatment: Dysphagia  Patient Details Name: Dana Morton MRN: [id number] DOB: [birth year] Today's Date: 11/11/2023 Time: 4098-1191 SLP Time Calculation (min) (ACUTE ONLY): 42 min  Assessment / Plan / Recommendation Clinical Impression  Dana Morton was more alert today, communicating with aphasic speech but some very clear phrases.  Her husband, Dana Morton, and friend Dana Morton were at the bedside.  Elevated HOB and repositioned.  Pt was restless, focused on removing her soft mitts. Removed right mitt so that she could self feed. She held the cup with assist and drank only 2-3 sips of thin water before declining any further food or liquid. Encouraged her to try some bites of applesauce and sips of apple juice but she consistently declined.  There were no s/s of aspiration; pt has no interest in eating or drinking.  Provided support to Dana Morton; MD and Palliative Care NP arrived at the end of our session.    Recommend allowing full liquids per pt's preferences.  Elevate HOB and allow her to feed herself with support.   SLP will follow for plan of care.   HPI HPI: Pt is a 73 y/o female who was admitted 4/24 secondary to failure to thrive;  noted to not want to eat or drink or take her meds. Cortrak placed 4/25. CT head negative. EEG 4/27: no seizures; cortical dysfunction from left temporal region, likely secondary to underlying structural abnormality; mild to moderate diffuse encephalopathy. Pt recently admitted to [facility] 4/8 for fall from bed at the rehab/nursing facility and discharged on 4/14. 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 BSE 08/07/23: cognitively based dysphagia; a dysphagia 3 diet started at that time with consideration of pt's mentation and per family's preference.      SLP Plan  Continue with current plan of care       Recommendations for follow up therapy are one component of a multi-disciplinary discharge planning process, led by the attending physician.  Recommendations may be updated based on patient status, additional functional criteria and insurance authorization.    Recommendations  Diet recommendations: Other(comment) (full liquids) Liquids provided via: Straw;Cup;Teaspoon Medication Administration: Whole meds with puree Supervision: Patient able to self feed;Staff to assist with self feeding                  Oral care QID   Frequent or constant Supervision/Assistance Dysphagia, oropharyngeal phase (R13.12)     Continue with current plan of care   Dana Morton L. Dana Lincoln, MA CCC/SLP Clinical Specialist - Acute Care SLP Acute Rehabilitation Services Office number [phone]   Dana Morton Dana Morton  11/11/2023, 3:17 PM

## 2023-11-11 NOTE — Progress Notes (Addendum)
 Nutrition Follow-up  DOCUMENTATION CODES:   Severe malnutrition in context of acute illness/injury  INTERVENTION:  Pending diet advancement, modify tube feeding via cortrak tube: Osmolite 1.5 at 83 ml/h (996 ml per day) x12 hours (6PM-6AM) Start at 50ml/hr and increase by 10ml/hr q4 hours  until goal rate achieved Prosource TF20 60 ml 2x/d  Provides 1680 kcal (93% of minimum estimated needs), 102 gm protein (107% of minimum estimated needs), 762 ml free water daily  MVI with minerals 1 packet Juven BID, each packet provides 95 calories, 2.5 grams of protein (collagen), + micronutrients to support wound healing Once diet advanced, initiate calorie count to assess intake Monitor speech notes for diet advancement/tolerance Add Ensure Enlive po TID, each supplement provides 350 kcal and 20 grams of protein.  Add Magic cup TID with meals, each supplement provides 290 kcal and 9 grams of protein   NUTRITION DIAGNOSIS:  Severe Malnutrition related to acute illness (recurrent hospitalizations and rehab stays) as evidenced by severe muscle depletion, moderate fat depletion, percent weight loss (10.5% x 3 months). - remains applicable  GOAL:  Patient will meet greater than or equal to 90% of their needs - meeting goal with TF running at goal rate  MONITOR: Diet advancement, Labs, Weight trends, TF tolerance, Skin  REASON FOR ASSESSMENT:  Consult Wound healing  ASSESSMENT:  Pt with hx of glioblastoma s/p resection 05/2023 and radiation therapy, HTN, HLD, DM type 2, and GERD presented to ED from SNF with sepsis.  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 Palliative reaching out to request adjustment in tube feed regimen to allow for intake. Pt noted to remain NPO. Speech to assess today. If diet advanced, recommend transition to nocturnal feedings and initiate calorie count to assess intake and ability to wean.  ADDENDUM (1600 on 4/30): Per speech, patient upgraded to full liquid diet. Calorie count initiated. Magic Cup added to meal trays TID and Ensure between meals TID to maximize intake, if possible.  Of note, patient with significant skin integrity issues and with dx of severe malnutrition. Recommend nocturnal feed regimen to meet 90-100% of her estimated calorie and protein needs to prevent progression of skin breakdown and/or further medical decline. Tolerating tube feed at goal rate.  Discussed this with family today and they verbalize understanding. Family stating they are still in the process of making the decision around comfort care and continue to struggle with the decision r/t her periods of improvement, which create hope.  Admit Weight: 97.5kg Current Weight: 103kg  Wt trend up since admission likely r/t fluids received and also regular nutrition intake. Continues with documented mild, generalized edema. Bowels stable.   Intake/Output Summary (Last 24 hours) at 11/11/2023 1442 Last data filed at 11/11/2023 1100 Gross per 24 hour  Intake 40 ml  Output 3260 ml  Net -3220 ml    Net IO Since Admission: 799.63 mL [11/11/23 1442]   Drains/Lines: Cortrak (65cm) PICC (placed 4/23) Foley catheter (chronic) UOP: 2.2L x24 hours  Did refeed s/p initiation of tube feeds. Repletion was required. Magnesium  ordered today. MVI continues.  Five-day course of thiamine  completed.   Meds: famotidine , SSI 0-15 q4, levothyroxine , MVI  Drips: 2g Mg sulfate x1  Labs reviewed from  4/29:  Na+ 137 (wdl) K+ 6.3>3.4--->4.0 (wdl) PHOS 2.7>1.5--->2.9 (wdl) Mg 2.2>1.5--->1.6 (L) CBGs 119-175 x24 hours A1c 6.7 (07/2023)  Diet Order:   Diet Order             Diet NPO time specified  Diet effective now             EDUCATION NEEDS:  Education needs have been addressed  Skin:  Skin Assessment: Skin Integrity Issues: Skin Integrity Issues:: Stage II Stage II: R buttocks Unstageable: buttocks  Last BM:  4/30 - type 6 x1  Height:  Ht Readings from Last 1 Encounters:  11/05/23 5\' 7"  (1.702 m)   Weight:  Wt Readings from Last 1 Encounters:  11/11/23 103 kg   Ideal Body Weight:  61.4 kg  BMI:  Body mass index is 35.55 kg/m.  Estimated Nutritional Needs:   Kcal:  1800-2000 kcal/d  Protein:  95-110 g/d  Fluid:  2L/d  Con Decant MS, RD, LDN Registered Dietitian Clinical Nutrition RD Inpatient Contact Info in Amion

## 2023-11-12 DIAGNOSIS — A419 Sepsis, unspecified organism: Secondary | ICD-10-CM | POA: Diagnosis not present

## 2023-11-12 LAB — GLUCOSE, CAPILLARY
Glucose-Capillary: 102 mg/dL — ABNORMAL HIGH (ref 70–99)
Glucose-Capillary: 154 mg/dL — ABNORMAL HIGH (ref 70–99)
Glucose-Capillary: 192 mg/dL — ABNORMAL HIGH (ref 70–99)
Glucose-Capillary: 218 mg/dL — ABNORMAL HIGH (ref 70–99)
Glucose-Capillary: 243 mg/dL — ABNORMAL HIGH (ref 70–99)
Glucose-Capillary: 88 mg/dL (ref 70–99)
Glucose-Capillary: 96 mg/dL (ref 70–99)
Glucose-Capillary: 98 mg/dL (ref 70–99)

## 2023-11-12 LAB — CBC
HCT: 24.8 % — ABNORMAL LOW (ref 36.0–46.0)
Hemoglobin: 8.2 g/dL — ABNORMAL LOW (ref 12.0–15.0)
MCH: 33.9 pg (ref 26.0–34.0)
MCHC: 33.1 g/dL (ref 30.0–36.0)
MCV: 102.5 fL — ABNORMAL HIGH (ref 80.0–100.0)
Platelets: 35 10*3/uL — ABNORMAL LOW (ref 150–400)
RBC: 2.42 MIL/uL — ABNORMAL LOW (ref 3.87–5.11)
RDW: 21.5 % — ABNORMAL HIGH (ref 11.5–15.5)
WBC: 5.6 10*3/uL (ref 4.0–10.5)
nRBC: 1.2 % — ABNORMAL HIGH (ref 0.0–0.2)

## 2023-11-12 LAB — BASIC METABOLIC PANEL WITH GFR
Anion gap: 8 (ref 5–15)
BUN: 39 mg/dL — ABNORMAL HIGH (ref 8–23)
CO2: 30 mmol/L (ref 22–32)
Calcium: 9.7 mg/dL (ref 8.9–10.3)
Chloride: 99 mmol/L (ref 98–111)
Creatinine, Ser: 0.59 mg/dL (ref 0.44–1.00)
GFR, Estimated: >60 mL/min (ref >60)
Glucose, Bld: 93 mg/dL (ref 70–99)
Potassium: 3.9 mmol/L (ref 3.5–5.1)
Sodium: 137 mmol/L (ref 135–145)

## 2023-11-12 LAB — MAGNESIUM: Magnesium: 1.8 mg/dL (ref 1.7–2.4)

## 2023-11-12 MED ORDER — COLLAGENASE 250 UNIT/GM EX OINT
TOPICAL_OINTMENT | Freq: Every day | CUTANEOUS | Status: DC
  Filled 2023-11-12: qty 30

## 2023-11-12 MED ORDER — SODIUM CHLORIDE 0.9 % IV BOLUS
500.0000 mL | INTRAVENOUS | Status: AC
  Administered 2023-11-12: 500 mL via INTRAVENOUS

## 2023-11-12 MED ORDER — MEDIHONEY WOUND/BURN DRESSING EX PSTE
1.0000 | PASTE | Freq: Every day | CUTANEOUS | Status: DC
  Administered 2023-11-15 – 2023-11-22 (×8): 1 via TOPICAL
  Filled 2023-11-12: qty 44

## 2023-11-12 MED ORDER — MIDODRINE HCL 5 MG PO TABS
10.0000 mg | ORAL_TABLET | ORAL | Status: AC
  Administered 2023-11-12: 10 mg
  Filled 2023-11-12: qty 2

## 2023-11-12 MED ORDER — DAKINS (1/4 STRENGTH) 0.125 % EX SOLN
Freq: Every day | CUTANEOUS | Status: AC
  Administered 2023-11-12: 1
  Filled 2023-11-12: qty 473

## 2023-11-12 NOTE — TOC Progression Note (Signed)
 Transition of Care [facility]) - Progression Note    Patient Details  Name: Dana Morton MRN: [id number] Date of Birth: 1950/12/29  [facility] CM/SW Contact  Juliane Och, LCSW Phone Number: 11/12/2023, 3:49 PM  Clinical Narrative:     3:49 PM Per chart review, Palliative Care NP requested [facility] Liaison to discuss home hospice vs inpatient hospice with patient's family. Patient's family expect to make final decisions over the weekend. Physical Therapy changed evaluation recommendation from SNF to LTC without physical therapy.  Expected Discharge Plan: Skilled Nursing Facility Barriers to Discharge: Continued Medical Work up  Expected Discharge Plan and Services In-house Referral: Clinical Social Work     Living arrangements for the past 2 months: Skilled Holiday representative, Single Family Home                                       Social Determinants of Health (SDOH) Interventions SDOH Screenings   Food Insecurity: No Food Insecurity (11/07/2023)  Housing: Low Risk  (11/07/2023)  Transportation Needs: Patient Unable To Answer (11/07/2023)  Utilities: Patient Unable To Answer (11/07/2023)  Financial Resource Strain: Low Risk  (10/03/2023)   Received from [facility]  Social Connections: Unknown (11/09/2023)  Tobacco Use: Low Risk  (11/05/2023)    Readmission Risk Interventions     No data to display

## 2023-11-12 NOTE — Plan of Care (Signed)

## 2023-11-12 NOTE — Progress Notes (Signed)
 Family still having ongoing GOC discussions about patient.  Her plts remain low.  She is getting medihoney and conservative wound management with WD dressing changes.  We would not recommend anything aggressive at this time.  Continue current treatment as this wound is not making her ill, etc.  We will sign off at this time.  Leone Ralphs 8:17 AM 11/12/2023

## 2023-11-12 NOTE — Evaluation (Signed)
 Occupational Therapy Evaluation Patient Details Name: Dana Morton MRN: [id number] DOB: [birth year] Today's Date: 11/12/2023   History of Present Illness   73 y.o. female presents to [facility] 11/05/23 from SNF after refusing food and meds for weeks. Pt initially with ICU admit for sepsis likely from UTI. Pt also with sacral stage 3 decubitus, AKI on CKD, acute metabolic encephalopathy, and acute respiratory failure. Pt now out of ICU. Multiple recent admissions, with most recent admit 10/20/23 for AMS and fall from bed at SNF. 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     Clinical Impressions Per chart review, prior to Nov 2024, pt was Independent with ADLs and functional mobility. Per chart review, in March 2025, pt required Min assist with UB ADLs, Max assist with LB ADLs, and +2 assist with use of lift equipment for STS transfers. Pt now presents with significantly decreased cognition, generalized weakness, and decreased safety and independence with ADLs. Pt with occasionally spontaneous B UE movements (such as to itch her nose), but with pt unable to respond to name and unable to follow 1-step commands this session. Pt currently requiring Total assist of +1 to +2 for all ADLs from bed level and Total assist +2 for bed mobility. During this session, with Total hand-over-hand assist, pt held cup and took one sip of juice through straw. Pt initially not responding when OT asked if she would like another sip, then pt clearly shaking head no when OT asked again. Pt also made minimal intermittent eye contact during session but with no other attempts at meaningful communication. Pt VSS on RA throughout session. OT plans to see pt for one more session prior to discharge from acute OT services to train family and/or  Mobility Specialist in B UE PROM HEP to maintain joint integrity and minimize pt discomfort with PROM during ADLs. Per chart, pt's family currently making decisions regarding long-term goals of care. Depending on family decisions and preferences, OT recommends long-term institutionalized care or, if family able to safely provide and comfortable with providing needed level of care, home-based palliative/hospice care paired with 24/7 family supervision/assistance. No post-acute OT follow-up needs anticipated.      If plan is discharge home, recommend the following:   Two people to help with walking and/or transfers;Two people to help with bathing/dressing/bathroom;Assistance with cooking/housework;Assistance with feeding;Direct supervision/assist for medications management;Direct supervision/assist for financial management;Assist for transportation;Help with stairs or ramp for entrance;Supervision due to cognitive status     Functional Status Assessment   Patient has had a recent decline in their functional status and/or demonstrates limited ability to make significant improvements in function in a reasonable and predictable amount of time     Equipment Recommendations   Hoyer lift;Hospital bed     Recommendations for Other Services         Precautions/Restrictions   Precautions Precautions: Fall Recall of Precautions/Restrictions: Impaired Precaution/Restrictions Comments: sacral wound stage III Restrictions Weight Bearing Restrictions Per Provider Order: No     Mobility Bed Mobility Overal bed mobility: Needs Assistance Bed Mobility: Rolling Rolling: Total assist, +2 for physical assistance, +2 for safety/equipment         General bed mobility comments: Pt not able to follow 1-step commands to assist with L/R roll and showing no spontanious attempts to assist    Transfers  General transfer comment: unsafe at this time; recommend maximove  transfers      Balance                                           ADL either performed or assessed with clinical judgement   ADL Overall ADL's : Needs assistance/impaired                                       General ADL Comments: Pt currently requires Total assist of +1 to +2 for all ADLs.     Vision   Additional Comments: Difficult to assess. Pt making brief eye contact with OT when OT read a note from pt's husband, which he had left in the room and when OT asking pt if she would like a drink of juice. Pt unable to report and unable to follow 1-step commands for cision screen.     Perception         Praxis         Pertinent Vitals/Pain Pain Assessment Pain Assessment: Faces Faces Pain Scale: Hurts a little bit Pain Location: generalized with movement Pain Descriptors / Indicators: Grimacing Pain Intervention(s): Monitored during session, Repositioned     Extremity/Trunk Assessment Upper Extremity Assessment Upper Extremity Assessment: Generalized weakness;RUE deficits/detail;LUE deficits/detail;Difficult to assess due to impaired cognition RUE Deficits / Details: pt with limited spontanious AROM of UE and unable to follow 1-step commands; pt allowing PROM with PROM WFL throughout; generalized weakness; decreased coordination; pt requiring hand-over-hand assist to grasp items (cup, wash cloth) with either hand RUE Coordination: decreased fine motor;decreased gross motor LUE Deficits / Details: pt with limited spontanious AROM of UE and unable to follow 1-step commands; pt allowing PROM with PROM WFL throughout; generalized weakness; decreased coordination; pt requiring hand-over-hand assist to grasp items (cup, wash cloth) with either hand LUE Coordination: decreased fine motor;decreased gross motor   Lower Extremity Assessment Lower Extremity Assessment: Defer to PT evaluation   Cervical / Trunk Assessment Cervical / Trunk Assessment:  Kyphotic;Other exceptions Cervical / Trunk Exceptions: increased body habitus   Communication Communication Communication: Impaired Factors Affecting Communication: Other (comment);Difficulty expressing self (Pt with aphasia at baseline. Pt not responding to name and not making any attempt to follow 1-step commands. Pt only responding to one question on the third ask with pt shaking head no when asked if she wanted another drink.)   Cognition Arousal: Lethargic, Obtunded (Pt asleep and difficult to wake upon arrival. Pt with increased alertness with elevating HOB and PROM of extremeties, but quickly starting to fall asleep again. and requiring intermittent tactile or verbal cues to alert.) Behavior During Therapy: Flat affect, Restless (Largely flat affect with mild intermittent restlessness noted during session with pt attempting to bring LE to EOB and attempting to reach for lines. Pt easy to redirect.) Cognition: Cognition impaired   Orientation impairments: Person, Place, Time, Situation Awareness: Intellectual awareness impaired, Online awareness impaired Memory impairment (select all impairments): Short-term memory, Working Civil Service fast streamer, Non-declarative long-term memory, Geneticist, molecular long-term memory Attention impairment (select first level of impairment): Focused attention Executive functioning impairment (select all impairments): Initiation, Organization, Sequencing, Reasoning, Problem solving OT - Cognition Comments: Pt with aphasia at baseline. Pt not responding to name, unable to follow 1-step commands, and requiring Total assist hand-over-hand for familiar tasks (  ex. holding a cup, washing face).                 Following commands: Impaired Following commands impaired:  (Pt unable to follow 1-step commands this session.)     Cueing  General Comments   Cueing Techniques: Verbal cues;Tactile cues;Visual cues  VSS on RA   Exercises     Shoulder Instructions      Home Living  Family/patient expects to be discharged to:: Skilled nursing facility (Pt presenting to [facility] from SNF where she was receiving short-term rehab.) Living Arrangements: Spouse/significant other Available Help at Discharge: Family;Available 24 hours/day Type of Home: House Home Access: Stairs to enter [employer] of Steps: 3 Entrance Stairs-Rails: Right;Left Home Layout: One level     Bathroom Shower/Tub: Producer, television/film/video: Handicapped height Bathroom Accessibility: Yes   Home Equipment: Agricultural consultant (2 wheels);Rollator (4 wheels);Cane - single point;BSC/3in1;Shower seat;Wheelchair - manual;Cane - quad;Grab bars - toilet;Grab bars - tub/shower;Hand held shower head;Lift chair   Additional Comments: Home set-up per chart review. Pt has had long course of multiple hospital admission. Pt had glioblastoma resection in Nov 2024 at [facility] and then completed chemo/rad tx in Dec 2024. Pt with multiple hospitalizations since that time with most recent hospitalization at [facility] 10/20/2023.      Prior Functioning/Environment Prior Level of Function : Needs assist             Mobility Comments: Per chart review; in March 2025, pt had stood with PT at [facility] with +2 assist with use of lift equipment. Prior to resection in Nov 2024, pt was Ind. ADLs Comments: Per chart review, in March 2025, pt needing Min A for UB ADL, able to feed herself, Max A LB ADL 2 weeks prior.Prior to resection in Nov 2024, pt was Ind with ADLs    OT Problem List: Decreased strength;Decreased coordination;Decreased cognition;Decreased activity tolerance   OT Treatment/Interventions: Therapeutic exercise;Therapeutic activities;Patient/family education      OT Goals(Current goals can be found in the care plan section)   Acute Rehab OT Goals Patient Stated Goal: pt unable to state OT Goal Formulation: Patient unable to participate in goal setting Time For Goal Achievement: 11/26/23 Potential to  Achieve Goals: Good ADL Goals Pt Will Perform Eating: with max assist;with caregiver independent in assisting;bed level (with adaptive equipment as needed) Pt/caregiver will Perform Home Exercise Program: Both right and left upper extremity;With written HEP provided (maintain joint integrity; with Total assist; with caregiver Independent in assisting pt)   OT Frequency:  Min 1X/week    Co-evaluation PT/OT/SLP Co-Evaluation/Treatment: Yes Reason for Co-Treatment: Complexity of the patient's impairments (multi-system involvement);Necessary to address cognition/behavior during functional activity;For patient/therapist safety   OT goals addressed during session: ADL's and self-care;Strengthening/ROM      AM-PAC OT "6 Clicks" Daily Activity     Outcome Measure Help from another person eating meals?: Total Help from another person taking care of personal grooming?: Total Help from another person toileting, which includes using toliet, bedpan, or urinal?: Total Help from another person bathing (including washing, rinsing, drying)?: Total Help from another person to put on and taking off regular upper body clothing?: Total Help from another person to put on and taking off regular lower body clothing?: Total 6 Click Score: 6   End of Session Nurse Communication: Mobility status;Other (comment) (Pt taking one sip of juice and refusing further food/drink. Pt's lunch tray in room.)  Activity Tolerance: Patient limited by lethargy;Treatment limited secondary to medical  complications (Comment) (limited by decreased cognition) Patient left: in bed;with call bell/phone within reach  OT Visit Diagnosis: Muscle weakness (generalized) (M62.81);Other symptoms and signs involving cognitive function                Time: 4098-1191 OT Time Calculation (min): 14 min Charges:  OT General Charges $OT Visit: 1 Visit OT Evaluation $OT Eval Low Complexity: 1 Low  Slayter Moorhouse "Kyle" M., OTR/L, MA Acute  Rehab [phone]   Walt Gunner 11/12/2023, 1:44 PM

## 2023-11-12 NOTE — Consult Note (Signed)
 WOC reconsulted for sacral wound due to foul odor per bedside nurse.  K,Osborne surgical PA has also assessed this wound and recommended continued conservative management.   I will DC Medihoney and switch to Dakin's wet to dry dressings for 3 days then resume Medihoney as surgery recommended.   POC discussed with bedside nurse.  WOC team will not follow.  Re-consult if further needs arise.   Thank you    Ronni Colace MSN, RN-BC, [employer] [phone]

## 2023-11-12 NOTE — Progress Notes (Signed)
 Speech Pathology - dysphagia treatment  11/12/23 0900  SLP Visit Information  SLP Received On 11/12/23  General Information  Behavior/Cognition Alert  Patient Positioning Upright in bed  Oral care provided N/A  HPI Pt is a 73 y/o female who was admitted 4/24 secondary to failure to thrive;  noted to not want to eat or drink or take her meds. Cortrak placed 4/25. CT head negative. EEG 4/27: no seizures; cortical dysfunction from left temporal region, likely secondary to underlying structural abnormality; mild to moderate diffuse encephalopathy. Pt recently admitted to [facility] 4/8 for fall from bed at the rehab/nursing facility and discharged on 4/14. 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 BSE 08/07/23: cognitively based dysphagia; a dysphagia 3 diet started at that time with consideration of pt's mentation and per family's preference.  Treatment Provided  Treatment provided Dysphagia: Pt was seen for dysphagia treatment. She was alert; oral care provided, HOB elevated.  She did accept multiple bites of melted strawberry [place] ice, demonstrating adequate toleration with no concerns for aspiration.  After several bites, she declined all further POs, including liquids, despite gentle encouragement.  Her son, [name], arrived and we spoke about his mother's overall decline, specifically related to PO intake and her discomfort with the tube feeding.  He verbalized understanding.  Attempted to reach Bertrand, the pt's daughter, without success.  SLP will f/u for family education/support as needed.  Continue full liquid diet for now.  Dysphagia Treatment  Temperature Spikes Noted No  Respiratory Status Room air  Oral Cavity - Dentition Adequate natural dentition  Treatment Methods Skilled observation  Patient observed directly with PO's Yes  Type of PO's observed  Dysphagia 1 (puree)  Feeding Total assist  Liquids provided via Teaspoon  Type of cueing Verbal  Amount of cueing Modified independent  Pain Assessment  Pain Assessment Faces  Faces Pain Scale 0  SLP - End of Session  Patient left in bed;with call bell/phone within reach;Other (comment) (soft mitts in place)  Nurse Communication Treatment plan  Assessment / Recommendations / Plan  Plan Discharge SLP treatment due to (comment)  Dysphagia Recommendations  Diet recommendations Other(comment) (full liquids)  Liquids provided via Teaspoon;Cup;Straw  Medication Administration Whole meds with puree  Supervision Trained caregiver to feed patient  Compensations Minimize environmental distractions  General Recommendations  Oral Care Recommendations Oral care QID  Follow Up Recommendations No SLP follow up  Assistance recommended at discharge Frequent or constant Supervision/Assistance  SLP Visit Diagnosis Dysphagia, oropharyngeal phase (R13.12)  Progression Toward Goals  Progression toward goals Progressing toward goals  SLP Time Calculation  SLP Start Time (ACUTE ONLY) 1610  SLP Stop Time (ACUTE ONLY) 0953  SLP Time Calculation (min) (ACUTE ONLY) 24 min  SLP Evaluations  $Swallowing Treatment 1 Procedure  Elsy Chiang L. Beatris Lincoln, MA CCC/SLP Clinical Specialist - Acute Care SLP Acute Rehabilitation Services Office number [phone]

## 2023-11-12 NOTE — Progress Notes (Signed)
 [facility] Liaison Note  Received request from Arnie Bibber to discuss hospice in home vs IPU yesterday. Madelene Schanz, RN reached out to husband and request was made to touch base today regarding further conversations.   Spoke with [name], patients spouse, this morning and he and family would like to see how patient does over the next couple of days before making any final decisions. I provided him with the liaison phone number for any further questions.  Notified Marlowe Sinclair, RN of above.  HLT will continue to follow along in the chart for disposition.  Please call with questions or concerns.  Thank you, Lestine Rathke, BSN, [facility] [phone]

## 2023-11-12 NOTE — Progress Notes (Signed)
 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  Patient was recently discharged from rehab facility on 4/14.  Patient was not taking her medications and food at home and had been bedbound.  According to husband, who was at the beside, she has been refusing her food and meds for several weeks.  In the ED, patient was hypotensive and required vasopressors.  Lactic acid was 2.2 initially.  Creatinine was 3.2 from baseline 0.7.[name]  At this time, patient has been transferred out of the ICU.     Assessment/Plan: Principal Problem:   Sepsis (HCC) Active Problems:   Protein-calorie malnutrition, severe  Sepsis with septic shock secondary to UTI. Patient initially received vasopressors.  Foley catheter was changed.  Initially received vancomycin  and meropenem  due to history of ESBL.  Subsequently was on meropenem  and has completed the course.  Urine culture with no growth.  Blood cultures negative in 5 days.  Afebrile at this time with a temperature max of 98.4. F.  Latest blood pressure at 96/67.  AKI on CKD Improved at this time.  Latest creatinine of 0.5.  Acute respiratory failure Currently on room air.  Continue to monitor closely.  Currently on room air.   Sacral decub, left unstageable present on admission, right buttocks stage II ulceration present on admission Pressure Injury 10/20/23 Buttocks Right Stage 2 -  Partial thickness loss of dermis presenting as a shallow open injury with a red, pink wound bed without slough. the area is pink in the right buttocks (Active)  10/20/23 1500  Location: Buttocks  Location Orientation: Right  Staging: Stage 2 -  Partial  thickness loss of dermis presenting as a shallow open injury with a red, pink wound bed without slough.  Wound Description (Comments): the area is pink in the right buttocks  Present on Admission: Yes     Pressure Injury 11/06/23 Buttocks Left Unstageable - Full thickness tissue loss in which the base of the injury is covered by slough (yellow, tan, gray, green or brown) and/or eschar (tan, brown or black) in the wound bed. Full tissue loss in which base  (Active)  11/06/23 0430  Location: Buttocks  Location Orientation: Left  Staging: Unstageable - Full thickness tissue loss in which the base of the injury is covered by slough (yellow, tan, gray, green or brown) and/or eschar (tan, brown or black) in the wound bed.  Wound Description (Comments): Full tissue loss in which base of injury covered by slough  Present on Admission: Yes    Wound care recommending surgical intervention and possible debridement in a nonurgent basis.   Acute metabolic encephalopathy H/o Seizures  History of glioblastoma s/p resection. Continue AEDs with Vimpat  and Keppra ..  Continue dexamethasone . On mittens, appears to be alert awake but difficult to understanding speech.    Severe Calorie malnutrition, present on admission Body mass index is 35.24 kg/m.  Refeeding syndrome Nutrition Status: Nutrition Problem: Severe Malnutrition Etiology: acute illness (recurrent hospitalizations and rehab stays) Signs/Symptoms: severe muscle depletion, moderate fat depletion, percent weight loss (10.5% x 3 months) Percent weight loss: 10.5 % Interventions: Juven, MVI, Prostat, Tube feeding   On cortrak tube and tube feeding.  Currently on Osmolite  1.5 and Prosource.  Hypomagnesemia.  Magnesium  of 1.6.  Will replace with 2 g of IV magnesium  sulfate.  Check magnesium  level in AM.   Anemia of chronic disease Thrombocytopenia Received 1 unit of packed RBC.  Continue to monitor closely.  Significant thrombocytopenia with  platelet count of 31..  Latest hemoglobin of 7.6.   Left toe swelling Secondary to chronic gout.  Continue to monitor.  Hypothyroidism.  Continue Synthroid   Class I obesity.Body mass index is 35.24 kg/m.  Nutrition on board.  On tube feeding at this time.   Goals of care I again had a prolonged discussion with the patient's husband yesterday.  Palliative care had discussed with the family yesterday as well.  Concern for Herpes Zoster Will start Valtrex  1000 g 3 times daily for 7 days.  On airborne isolation precautions.  DVT prophylaxis: SCDs Start: 11/05/23 2151  Disposition: Uncertain at this time.  Goals of care discussion underway.  Likely to skilled nursing facility if improved/consideration of hospice   Status is: Inpatient Remains inpatient appropriate because: Pending clinical improvement, cortrak tube tube feeding, encephalopathy    Code Status:     Code Status: Limited: Do not attempt resuscitation (DNR) -DNR-LIMITED -Do Not Intubate/DNI   Family Communication: Had a prolonged discussion with the patient's daughter and spouse 11/10/2023, spoke with the patient's son at bedside 11/12/2023  Consultants: Palliative care PCCM General Surgery  Procedures: Cortrak tube tube feeding. EEG  Anti-infectives:   Valtrex .  Anti-infectives (From admission, onward)    Start     Dose/Rate Route Frequency Ordered Stop   11/11/23 1600  valACYclovir  (VALTREX ) tablet 1,000 mg        1,000 mg Per Tube 3 times daily 11/11/23 1308 11/18/23 0959   11/11/23 1200  valACYclovir  (VALTREX ) tablet 1,000 mg  Status:  Discontinued        1,000 mg Oral 3 times daily 11/11/23 1107 11/11/23 1308   11/07/23 2300  vancomycin  (VANCOREADY) IVPB 750 mg/150 mL  Status:  Discontinued        750 mg 150 mL/hr over 60 Minutes Intravenous Every 48 hours 11/05/23 2234 11/07/23 0825   11/07/23 1000  vancomycin  (VANCOREADY) IVPB 1250 mg/250 mL  Status:  Discontinued        1,250 mg 166.7 mL/hr over 90  Minutes Intravenous Every 24 hours 11/07/23 0825 11/07/23 0834   11/07/23 1000  Vancomycin  (VANCOCIN ) 1,250 mg in sodium chloride  0.9 % 250 mL IVPB  Status:  Discontinued        1,250 mg 166.7 mL/hr over 90 Minutes Intravenous Every 24 hours 11/07/23 0834 11/08/23 1433   11/07/23 0915  meropenem  (MERREM ) 1 g in sodium chloride  0.9 % 100 mL IVPB        1 g 200 mL/hr over 30 Minutes Intravenous Every 8 hours 11/07/23 0820 11/09/23 2159   11/05/23 2330  meropenem  (MERREM ) 500 mg in sodium chloride  0.9 % 100 mL IVPB  Status:  Discontinued        500 mg 200 mL/hr over 30 Minutes Intravenous Every 12 hours 11/05/23 2153 11/07/23 0820   11/05/23 2215  vancomycin  (VANCOREADY) IVPB 2000 mg/400 mL        2,000 mg 200 mL/hr over 120 Minutes Intravenous  Once 11/05/23 2210 11/06/23 0045   11/05/23 2045  meropenem  (MERREM ) 1 g in sodium chloride  0.9 % 100 mL IVPB  Status:  Discontinued        1 g 200 mL/hr over 30 Minutes Intravenous  Once 11/05/23  2043 11/05/23 2214       Subjective:  Today, patient was seen and examined at bedside.  Appears to be alert awake and mildly interactive.  Difficult understand speech.  Patient's son at bedside and was able to recognize.  Trying to get up from the bed.  Objective: Vitals:   11/12/23 0500 11/12/23 0757  BP: 119/84 96/67  Pulse: 62 93  Resp: 19 17  Temp: 97.7 F (36.5 C) 97.8 F (36.6 C)  SpO2: 92% 95%    Intake/Output Summary (Last 24 hours) at 11/12/2023 1034 Last data filed at 11/12/2023 0625 Gross per 24 hour  Intake 663.99 ml  Output 2550 ml  Net -1886.01 ml   Filed Weights   11/10/23 0654 11/11/23 0505 11/12/23 0500  Weight: 108.8 kg 103 kg 102.1 kg   Body mass index is 35.24 kg/m.   Physical Exam:  GENERAL: Patient is alert awake, indistinctly speech, mildly interactive,. Not in obvious distress.  Obese build.  Cortrak tube tube in place.  Appears deconditioned and weak. HENT: No scleral pallor or icterus. Pupils equally reactive  to light. Oral mucosa is moist NECK: is supple, no gross swelling noted. CHEST:   Diminished breath sounds bilaterally. CVS: S1 and S2 heard, no murmur. Regular rate and rhythm.  ABDOMEN: Soft, non-tender, bowel sounds are present. EXTREMITIES: No edema.   CNS: Indistinct speech.  Moves extremities.  Generalized weakness noted. SKIN: warm and dry, erythromatous  rash on the back noted on 11/09/2023, sacral ulcer       Data Review: I have personally reviewed the following laboratory data and studies,  CBC: Recent Labs  Lab 11/05/23 1633 11/05/23 1640 11/07/23 0233 11/08/23 0525 11/08/23 2111 11/09/23 0345 11/10/23 0447 11/12/23 0240  WBC 7.2   < > 4.5 3.4*  --  3.3* 3.5* 5.6  NEUTROABS 2.5  --   --   --   --   --   --   --   HGB 10.2*   < > 8.9* 7.0* 8.3* 7.6* 7.6* 8.2*  HCT 32.3*   < > 25.9* 21.0* 24.9* 23.0* 22.8* 24.8*  MCV 109.9*   < > 102.8* 104.5*  --  102.2* 102.2* 102.5*  PLT 44*   < > 42* 30*  --  29* 31* 35*   < > = values in this interval not displayed.   Basic Metabolic Panel: Recent Labs  Lab 11/06/23 0020 11/06/23 1133 11/07/23 0233 11/08/23 0411 11/08/23 2112 11/09/23 0345 11/10/23 0447 11/12/23 0240  NA 140   < > 141 142  --  139 137 137  K 3.4*   < > 3.9 3.9  --  4.4 4.0 3.9  CL 107  --  110 109  --  105 101 99  CO2 21*  --  23 27  --  26 29 30   GLUCOSE 141*  --  203* 168*  --  175* 119* 93  BUN 47*  --  24* 32*  --  35* 32* 39*  CREATININE 2.17*  --  0.78 0.68  --  0.61 0.56 0.59  CALCIUM  8.0*  --  8.4* 8.7*  --  8.9 9.0 9.7  MG 1.4*  --  2.2 1.5* 1.9 2.1 1.6* 1.8  PHOS 4.0  --  2.7 1.5* 3.6 2.9  --   --    < > = values in this interval not displayed.   Liver Function Tests: Recent Labs  Lab 11/05/23 1633 11/07/23 0233  AST 81* 50*  ALT  47* 37  ALKPHOS 113 95  BILITOT 1.2 0.6  PROT 7.0 5.8*  ALBUMIN 2.8* 2.3*   No results for input(s): "LIPASE", "AMYLASE" in the last 168 hours. No results for input(s): "AMMONIA" in the last 168  hours. Cardiac Enzymes: No results for input(s): "CKTOTAL", "CKMB", "CKMBINDEX", "TROPONINI" in the last 168 hours. BNP (last 3 results) No results for input(s): "BNP" in the last 8760 hours.  ProBNP (last 3 results) No results for input(s): "PROBNP" in the last 8760 hours.  CBG: Recent Labs  Lab 11/11/23 1617 11/11/23 2006 11/12/23 0059 11/12/23 0502 11/12/23 0805  GLUCAP 243* 109* 96 98 88   Recent Results (from the past 240 hours)  Blood Culture (routine x 2)     Status: None   Collection Time: 11/05/23  4:33 PM   Specimen: BLOOD LEFT HAND  Result Value Ref Range Status   Specimen Description BLOOD LEFT HAND  Final   Special Requests   Final    BOTTLES DRAWN AEROBIC AND ANAEROBIC Blood Culture results may not be optimal due to an inadequate volume of blood received in culture bottles   Culture   Final    NO GROWTH 5 DAYS Performed at [facility], [address] [place], [place] [address]    Report Status 11/10/2023 FINAL  Final  Blood Culture (routine x 2)     Status: None   Collection Time: 11/05/23  5:09 PM   Specimen: BLOOD  Result Value Ref Range Status   Specimen Description BLOOD SITE NOT SPECIFIED  Final   Special Requests   Final    BOTTLES DRAWN AEROBIC ONLY Blood Culture results may not be optimal due to an inadequate volume of blood received in culture bottles   Culture   Final    NO GROWTH 5 DAYS Performed at [facility], [address], [place], [place] [address]    Report Status 11/10/2023 FINAL  Final  Resp panel by RT-PCR (RSV, Flu A&B, Covid) Anterior Nasal Swab     Status: None   Collection Time: 11/05/23  6:25 PM   Specimen: Anterior Nasal Swab  Result Value Ref Range Status   SARS Coronavirus 2 by RT PCR NEGATIVE NEGATIVE Final   Influenza A by PCR NEGATIVE NEGATIVE Final   Influenza B by PCR NEGATIVE NEGATIVE Final    Comment: (NOTE) The Xpert Xpress SARS-CoV-2/FLU/RSV plus assay is intended as an aid in the diagnosis of  influenza from Nasopharyngeal swab specimens and should not be used as a sole basis for treatment. Nasal washings and aspirates are unacceptable for Xpert Xpress SARS-CoV-2/FLU/RSV testing.  Fact Sheet for Patients: BloggerCourse.com  Fact Sheet for Healthcare Providers: SeriousBroker.it  This test is not yet approved or cleared by the [place]  FDA and has been authorized for detection and/or diagnosis of SARS-CoV-2 by FDA under an Emergency Use Authorization (EUA). This EUA will remain in effect (meaning this test can be used) for the duration of the COVID-19 declaration under Section 564(b)(1) of the Act, 21 U.S.C. section 360bbb-3(b)(1), unless the authorization is terminated or revoked.     Resp Syncytial Virus by PCR NEGATIVE NEGATIVE Final    Comment: (NOTE) Fact Sheet for Patients: BloggerCourse.com  Fact Sheet for Healthcare Providers: SeriousBroker.it  This test is not yet approved or cleared by the [place]  FDA and has been authorized for detection and/or diagnosis of SARS-CoV-2 by FDA under an Emergency Use Authorization (EUA). This EUA will remain in effect (meaning this test  can be used) for the duration of the COVID-19 declaration under Section 564(b)(1) of the Act, 21 U.S.C. section 360bbb-3(b)(1), unless the authorization is terminated or revoked.  Performed at [facility], [address] [place], [place] [address]   MRSA Next Gen by PCR, Nasal     Status: None   Collection Time: 11/06/23  3:47 AM   Specimen: Nasal Mucosa; Nasal Swab  Result Value Ref Range Status   MRSA by PCR Next Gen NOT DETECTED NOT DETECTED Final    Comment: (NOTE) The GeneXpert MRSA Assay (FDA approved for NASAL specimens only), is one component of a comprehensive MRSA colonization surveillance program. It is not intended to diagnose MRSA infection nor to guide or  monitor treatment for MRSA infections. Test performance is not FDA approved in patients less than 71 years old. Performed at [facility], [address], [place], [place] [address]   Urine Culture (for pregnant, neutropenic or urologic patients or patients with an indwelling urinary catheter)     Status: None   Collection Time: 11/07/23  9:18 AM   Specimen: Urine, Catheterized  Result Value Ref Range Status   Specimen Description URINE, CATHETERIZED  Final   Special Requests NONE  Final   Culture   Final    NO GROWTH Performed at [facility], [address], [place], [place] [address]    Report Status 11/08/2023 FINAL  Final     Studies: No results found.     Janani Chamber, MD  [facility] 11/12/2023  If 7PM-7AM, please contact night-coverage

## 2023-11-12 NOTE — Evaluation (Signed)
 Physical Therapy Evaluation Patient Details Name: Dana Morton MRN: [id number] DOB: 14-Mar-1951 Today's Date: 11/12/2023  History of Present Illness  73 y.o. female presents to [facility] 11/05/23 from SNF after refusing food and meds for weeks. Pt initially with ICU admit for sepsis likely from UTI. Pt also with sacral stage 3 decubitus, AKI on CKD, acute metabolic encephalopathy, and acute respiratory failure. Pt now out of ICU. Multiple recent admissions, with most recent admit 10/20/23 for AMS and fall from bed at SNF. 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   Clinical Impression  Per chart review, pt required +2 assist and use of lift equipment for STS transfers in 03/25. Pt presents to therapy eval below prior level of mobility requiring TotalAx2 to roll and reposition in bed. Pt has spontaneous movements in B LE, however, was unable to follow commands for movements or answer any questions. Acute PT to see for one more session to educate family on PROM and repositioning. Per chart review, pt's family currently making decisions regarding long-term goals of care. Recommending either long-term institutionalized care or home-based palliative/hospice care paired with 24/7 family supervision/assistance. No post-acute PT needs anticipated.          If plan is discharge home, recommend the following: Two people to help with walking and/or transfers;Two people to help with bathing/dressing/bathroom;Assistance with cooking/housework;Assistance with feeding;Direct supervision/assist for medications management;Direct supervision/assist for financial management;Assist for transportation;Help with stairs or ramp for entrance;Supervision due to cognitive status   Can travel by private vehicle   No    Equipment  Recommendations Hoyer lift;Hospital bed     Functional Status Assessment Patient has had a recent decline in their functional status and/or demonstrates limited ability to make significant improvements in function in a reasonable and predictable amount of time     Precautions / Restrictions Precautions Precautions: Fall Recall of Precautions/Restrictions: Impaired Precaution/Restrictions Comments: sacral wound stage III Restrictions Weight Bearing Restrictions Per Provider Order: No      Mobility  Bed Mobility Overal bed mobility: Needs Assistance Bed Mobility: Rolling Rolling: Total assist, +2 for physical assistance, +2 for safety/equipment    General bed mobility comments: Pt not able to follow 1-step commands to assist with L/R roll and showing no spontanious attempts to assist    Transfers    General transfer comment: unsafe at this time; recommend maximove transfers         Pertinent Vitals/Pain Pain Assessment Pain Assessment: Faces Faces Pain Scale: Hurts a little bit Pain Location: generalized with movement Pain Descriptors / Indicators: Grimacing Pain Intervention(s): Limited activity within patient's tolerance, Monitored during session, Repositioned    Home Living Family/patient expects to be discharged to:: Skilled nursing facility (Pt presenting to [facility] from SNF where she was receiving short-term rehab.) Living Arrangements: Spouse/significant other Available Help at Discharge: Family;Available 24 hours/day Type of Home: House Home Access: Stairs to enter Entrance Stairs-Rails: Doctor, general practice of Steps: 3   Home Layout: One level Home Equipment: Agricultural consultant (2 wheels);Rollator (4 wheels);Cane - single point;BSC/3in1;Shower seat;Wheelchair - manual;Cane - quad;Grab bars - toilet;Grab bars - tub/shower;Hand held shower head;Lift chair Additional Comments: Home set-up per chart review. Pt has had long course of multiple hospital  admission. Pt had glioblastoma resection in Nov 2024 at [facility] and then completed chemo/rad tx in Dec 2024. Pt with multiple hospitalizations since that  time with most recent hospitalization at [facility] 10/20/2023.    Prior Function Prior Level of Function : Needs assist    Mobility Comments: Per chart review; in March 2025, pt had stood with PT at [facility] with +2 assist with use of lift equipment. Prior to resection in Nov 2024, pt was Ind. ADLs Comments: Per chart review, in March 2025, pt needing Min A for UB ADL, able to feed herself, Max A LB ADL 2 weeks prior.Prior to resection in Nov 2024, pt was Ind with ADLs     Extremity/Trunk Assessment   Upper Extremity Assessment Upper Extremity Assessment: Defer to OT evaluation RUE Deficits / Details: pt with limited spontanious AROM of UE and unable to follow 1-step commands; pt allowing PROM with PROM WFL throughout; generalized weakness; decreased coordination; pt requiring hand-over-hand assist to grasp items (cup, wash cloth) with either hand RUE Coordination: decreased fine motor;decreased gross motor LUE Deficits / Details: pt with limited spontanious AROM of UE and unable to follow 1-step commands; pt allowing PROM with PROM WFL throughout; generalized weakness; decreased coordination; pt requiring hand-over-hand assist to grasp items (cup, wash cloth) with either hand LUE Coordination: decreased fine motor;decreased gross motor    Lower Extremity Assessment Lower Extremity Assessment: Difficult to assess due to impaired cognition (No volitional movement, spontaneous movement of B LE)    Cervical / Trunk Assessment Cervical / Trunk Assessment: Kyphotic;Other exceptions Cervical / Trunk Exceptions: increased body habitus  Communication   Communication Communication: Impaired Factors Affecting Communication: Other (comment);Difficulty expressing self (Pt with aphasia at baseline. Pt not responding to name and not making any attempt to follow  1-step commands. Pt only responding to one question on the third ask with pt shaking head no when asked if she wanted another drink.)    Cognition Arousal: Lethargic, Obtunded (Pt asleep and difficult to wake upon arrival. Pt with increased alertness with elevating HOB and PROM of extremeties, but quickly starting to fall asleep again. and requiring intermittent tactile or verbal cues to alert.) Behavior During Therapy: Flat affect, Restless (Largely flat affect with mild intermittent restlessness noted during session with pt attempting to bring LE to EOB and attempting to reach for lines. Pt easy to redirect.)    Following commands: Impaired Following commands impaired:  (Pt unable to follow 1-step commands this session.)     Cueing Cueing Techniques: Verbal cues, Tactile cues, Visual cues     General Comments General comments (skin integrity, edema, etc.): VSS on RA     PT Assessment Patient needs continued PT services  PT Problem List Decreased balance;Decreased activity tolerance;Decreased range of motion;Decreased strength;Decreased mobility;Decreased cognition;Decreased safety awareness;Decreased knowledge of use of DME;Obesity;Decreased skin integrity       PT Treatment Interventions DME instruction;Gait training;Stair training;Functional mobility training;Therapeutic activities;Therapeutic exercise;Balance training;Neuromuscular re-education;Cognitive remediation;Patient/family education;Wheelchair mobility training;Manual techniques    PT Goals (Current goals can be found in the Care Plan section)  Acute Rehab PT Goals Patient Stated Goal: unable to state goal PT Goal Formulation: Patient unable to participate in goal setting Time For Goal Achievement: 11/26/23 Potential to Achieve Goals: Fair    Frequency Min 1X/week     Co-evaluation   Reason for Co-Treatment: Complexity of the patient's impairments (multi-system involvement);Necessary to address cognition/behavior  during functional activity;For patient/therapist safety PT goals addressed during session: Mobility/safety with mobility OT goals addressed during session: ADL's and self-care;Strengthening/ROM       AM-PAC PT "6 Clicks" Mobility  Outcome Measure Help needed turning from your back  to your side while in a flat bed without using bedrails?: Total Help needed moving from lying on your back to sitting on the side of a flat bed without using bedrails?: Total Help needed moving to and from a bed to a chair (including a wheelchair)?: Total Help needed standing up from a chair using your arms (e.g., wheelchair or bedside chair)?: Total Help needed to walk in hospital room?: Total Help needed climbing 3-5 steps with a railing? : Total 6 Click Score: 6    End of Session   Activity Tolerance: Patient limited by lethargy Patient left: in bed;with call bell/phone within reach Nurse Communication: Mobility status;Need for lift equipment PT Visit Diagnosis: Muscle weakness (generalized) (M62.81);Difficulty in walking, not elsewhere classified (R26.2)    Time: 2952-8413 PT Time Calculation (min) (ACUTE ONLY): 14 min   Charges:   PT Evaluation $PT Eval Low Complexity: 1 Low   PT General Charges $$ ACUTE PT VISIT: 1 Visit         Orysia Blas, PT, DPT Secure Chat Preferred  Rehab Office [phone]   Alissa April Adela Ades 11/12/2023, 2:22 PM

## 2023-11-13 DIAGNOSIS — R4182 Altered mental status, unspecified: Secondary | ICD-10-CM | POA: Diagnosis not present

## 2023-11-13 DIAGNOSIS — Z515 Encounter for palliative care: Secondary | ICD-10-CM | POA: Diagnosis not present

## 2023-11-13 DIAGNOSIS — E43 Unspecified severe protein-calorie malnutrition: Secondary | ICD-10-CM | POA: Diagnosis not present

## 2023-11-13 DIAGNOSIS — A419 Sepsis, unspecified organism: Secondary | ICD-10-CM | POA: Diagnosis not present

## 2023-11-13 LAB — CBC
HCT: 25.6 % — ABNORMAL LOW (ref 36.0–46.0)
Hemoglobin: 8.4 g/dL — ABNORMAL LOW (ref 12.0–15.0)
MCH: 33.5 pg (ref 26.0–34.0)
MCHC: 32.8 g/dL (ref 30.0–36.0)
MCV: 102 fL — ABNORMAL HIGH (ref 80.0–100.0)
Platelets: 36 10*3/uL — ABNORMAL LOW (ref 150–400)
RBC: 2.51 MIL/uL — ABNORMAL LOW (ref 3.87–5.11)
RDW: 21.2 % — ABNORMAL HIGH (ref 11.5–15.5)
WBC: 5.5 10*3/uL (ref 4.0–10.5)
nRBC: 0.9 % — ABNORMAL HIGH (ref 0.0–0.2)

## 2023-11-13 LAB — BASIC METABOLIC PANEL WITH GFR
Anion gap: 12 (ref 5–15)
BUN: 34 mg/dL — ABNORMAL HIGH (ref 8–23)
CO2: 26 mmol/L (ref 22–32)
Calcium: 9.6 mg/dL (ref 8.9–10.3)
Chloride: 101 mmol/L (ref 98–111)
Creatinine, Ser: 0.64 mg/dL (ref 0.44–1.00)
GFR, Estimated: >60 mL/min (ref >60)
Glucose, Bld: 119 mg/dL — ABNORMAL HIGH (ref 70–99)
Potassium: 3.5 mmol/L (ref 3.5–5.1)
Sodium: 139 mmol/L (ref 135–145)

## 2023-11-13 LAB — GLUCOSE, CAPILLARY
Glucose-Capillary: 121 mg/dL — ABNORMAL HIGH (ref 70–99)
Glucose-Capillary: 128 mg/dL — ABNORMAL HIGH (ref 70–99)
Glucose-Capillary: 173 mg/dL — ABNORMAL HIGH (ref 70–99)
Glucose-Capillary: 212 mg/dL — ABNORMAL HIGH (ref 70–99)

## 2023-11-13 LAB — PHOSPHORUS: Phosphorus: 3.8 mg/dL (ref 2.5–4.6)

## 2023-11-13 LAB — MAGNESIUM: Magnesium: 1.6 mg/dL — ABNORMAL LOW (ref 1.7–2.4)

## 2023-11-13 MED ORDER — MAGNESIUM SULFATE 2 GM/50ML IV SOLN
2.0000 g | Freq: Once | INTRAVENOUS | Status: AC
  Administered 2023-11-13: 2 g via INTRAVENOUS
  Filled 2023-11-13: qty 50

## 2023-11-13 NOTE — Progress Notes (Signed)
 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  Patient was recently discharged from rehab facility on 4/14.  Patient was not taking her medications and food at home and had been bedbound.  According to husband, who was at the beside, she has been refusing her food and meds for several weeks.  In the ED, patient was hypotensive and required vasopressors.  Lactic acid was 2.2 initially.  Creatinine was 3.2 from baseline 0.7.[name]  At this time, patient has been transferred out of the ICU.    Assessment/Plan: Principal Problem:   Sepsis (HCC) Active Problems:   Protein-calorie malnutrition, severe  Sepsis with septic shock secondary to UTI. Patient initially received vasopressors.  Foley catheter was changed.  Initially received vancomycin  and meropenem  due to history of ESBL.  Subsequently was on meropenem  and has completed the course.  Urine culture with no growth.  Blood cultures negative in > 5 days.  Afebrile at this time with a temperature max of 98.5. F.  Latest blood pressure at 109/96, on midodrine  as well.  AKI on CKD Improved at this time.  Latest creatinine of 0.6.  Acute respiratory failure Currently on room air.  Continue to monitor closely.   Sacral decub, left unstageable present on admission, right buttocks stage II ulceration present on admission Pressure Injury 10/20/23 Buttocks Right Stage 2 -  Partial thickness loss of dermis presenting as a shallow open injury with a red, pink wound bed without slough. the area is pink in the right buttocks (Active)  10/20/23 1500  Location: Buttocks  Location Orientation: Right  Staging: Stage 2 -  Partial  thickness loss of dermis presenting as a shallow open injury with a red, pink wound bed without slough.  Wound Description (Comments): the area is pink in the right buttocks  Present on Admission: Yes     Pressure Injury 11/06/23 Buttocks Left Unstageable - Full thickness tissue loss in which the base of the injury is covered by slough (yellow, tan, gray, green or brown) and/or eschar (tan, brown or black) in the wound bed. Full tissue loss in which base  (Active)  11/06/23 0430  Location: Buttocks  Location Orientation: Left  Staging: Unstageable - Full thickness tissue loss in which the base of the injury is covered by slough (yellow, tan, gray, green or brown) and/or eschar (tan, brown or black) in the wound bed.  Wound Description (Comments): Full tissue loss in which base of injury covered by slough  Present on Admission: Yes    Wound care recommending surgical intervention and possible debridement in a nonurgent basis.   Acute metabolic encephalopathy H/o Seizures  History of glioblastoma s/p resection. Continue AEDs with Vimpat  and Keppra ..  Continue dexamethasone . On mittens, mildly Communicative with difficult understand speech.  Severe Calorie malnutrition, present on admission Body mass index is 33.52 kg/m.  Refeeding syndrome Nutrition Status: Nutrition Problem: Severe Malnutrition Etiology: acute illness (recurrent hospitalizations and rehab stays) Signs/Symptoms: severe muscle depletion, moderate fat depletion, percent weight loss (10.5% x 3 months) Percent weight loss: 10.5 % Interventions: Juven, MVI, Prostat, Tube feeding   On cortrak tube and tube feeding.  Currently on Osmolite 1.5 and Prosource.  Hypomagnesemia.  Magnesium   of 1.6.  Will replace with 2 g of IV magnesium  sulfate again today..  Check magnesium  level in AM.   Anemia of chronic disease Thrombocytopenia Received 1 unit of packed RBC.  Continue to monitor closely.  Significant thrombocytopenia with  platelet count of 36..  Latest hemoglobin of 8.4.   Left toe swelling Secondary to chronic gout.  Continue to monitor.  Hypothyroidism.  Continue Synthroid   Class I obesity.Body mass index is 33.52 kg/m.  Nutrition on board.  On tube feeding at this time.   Goals of care I again had a prolonged discussion with the patient's son on 11/12/2023.  Palliative care following.  Family to decide about further goals of care as clinical course progresses.  Concern for Herpes Zoster Will start Valtrex  1000 g 3 times daily for 7 days.  On airborne isolation precautions.  DVT prophylaxis: SCDs Start: 11/05/23 2151  Disposition: Uncertain at this time.  Goals of care discussion underway.  Likely to skilled nursing facility if improved   Status is: Inpatient Remains inpatient appropriate because: Pending clinical improvement, cortrak tube tube feeding, encephalopathy    Code Status:     Code Status: Limited: Do not attempt resuscitation (DNR) -DNR-LIMITED -Do Not Intubate/DNI   Family Communication:  Had a prolonged discussion with the patient's daughter and spouse 11/10/2023, spoke with the patient's son at bedside 11/12/2023  Consultants: Palliative care PCCM General Surgery  Procedures: Cortrak tube tube feeding. EEG  Anti-infectives:   Valtrex .  Anti-infectives (From admission, onward)    Start     Dose/Rate Route Frequency Ordered Stop   11/11/23 1600  valACYclovir  (VALTREX ) tablet 1,000 mg        1,000 mg Per Tube 3 times daily 11/11/23 1308 11/18/23 0959   11/11/23 1200  valACYclovir  (VALTREX ) tablet 1,000 mg  Status:  Discontinued        1,000 mg Oral 3 times daily 11/11/23 1107 11/11/23 1308   11/07/23 2300  vancomycin  (VANCOREADY) IVPB 750 mg/150 mL  Status:  Discontinued        750 mg 150 mL/hr over 60 Minutes Intravenous Every 48 hours 11/05/23 2234 11/07/23 0825   11/07/23 1000  vancomycin  (VANCOREADY) IVPB 1250 mg/250 mL  Status:  Discontinued        1,250 mg 166.7  mL/hr over 90 Minutes Intravenous Every 24 hours 11/07/23 0825 11/07/23 0834   11/07/23 1000  Vancomycin  (VANCOCIN ) 1,250 mg in sodium chloride  0.9 % 250 mL IVPB  Status:  Discontinued        1,250 mg 166.7 mL/hr over 90 Minutes Intravenous Every 24 hours 11/07/23 0834 11/08/23 1433   11/07/23 0915  meropenem  (MERREM ) 1 g in sodium chloride  0.9 % 100 mL IVPB        1 g 200 mL/hr over 30 Minutes Intravenous Every 8 hours 11/07/23 0820 11/09/23 2159   11/05/23 2330  meropenem  (MERREM ) 500 mg in sodium chloride  0.9 % 100 mL IVPB  Status:  Discontinued        500 mg 200 mL/hr over 30 Minutes Intravenous Every 12 hours 11/05/23 2153 11/07/23 0820   11/05/23 2215  vancomycin  (VANCOREADY) IVPB 2000 mg/400 mL        2,000 mg 200 mL/hr over 120 Minutes Intravenous  Once 11/05/23 2210 11/06/23 0045   11/05/23 2045  meropenem  (MERREM ) 1 g in sodium chloride  0.9 % 100 mL IVPB  Status:  Discontinued        1 g 200 mL/hr over 30 Minutes Intravenous  Once  11/05/23 2043 11/05/23 2214       Subjective:  Today, patient was seen and examined at bedside.  Mildly interactive.  Some slurred speech.  Denies pain.  Not much has changed overnight.   Objective: Vitals:   11/13/23 0419 11/13/23 0700  BP: (!) 119/56 (!) 109/96  Pulse: 100 (!) 111  Resp: 13 19  Temp: 98.4 F (36.9 C) 98.5 F (36.9 C)  SpO2: 99% 100%    Intake/Output Summary (Last 24 hours) at 11/13/2023 1137 Last data filed at 11/13/2023 1008 Gross per 24 hour  Intake 1200 ml  Output 1125 ml  Net 75 ml   Filed Weights   11/11/23 0505 11/12/23 0500 11/13/23 0419  Weight: 103 kg 102.1 kg 97.1 kg   Body mass index is 33.52 kg/m.   Physical Exam:  GENERAL: Patient is alert awake, indistinctly speech, mildly interactive,. Not in obvious distress.  Obese build.  Cortrak tube tube in place.  Appears deconditioned and weak. HENT: No scleral pallor or icterus. Pupils equally reactive to light. Oral mucosa is moist NECK: is supple, no  gross swelling noted. CHEST:   Diminished breath sounds bilaterally. CVS: S1 and S2 heard, no murmur. Regular rate and rhythm.  ABDOMEN: Soft, non-tender, bowel sounds are present. EXTREMITIES: No edema.   CNS: Indistinct speech.  Moves extremities.  Generalized weakness noted. SKIN: warm and dry, erythromatous  rash on the back noted on 11/09/2023, sacral ulcer       Data Review: I have personally reviewed the following laboratory data and studies,  CBC: Recent Labs  Lab 11/09/23 0345 11/10/23 0447 11/12/23 0240 11/13/23 0452 11/13/23 0611  WBC 3.3* 3.5* 5.6 PATIENT IDENTIFICATION ERROR. PLEASE DISREGARD RESULTS. ACCOUNT WILL BE CREDITED. 5.5  HGB 7.6* 7.6* 8.2* PATIENT IDENTIFICATION ERROR. PLEASE DISREGARD RESULTS. ACCOUNT WILL BE CREDITED. 8.4*  HCT 23.0* 22.8* 24.8* PATIENT IDENTIFICATION ERROR. PLEASE DISREGARD RESULTS. ACCOUNT WILL BE CREDITED. 25.6*  MCV 102.2* 102.2* 102.5* PATIENT IDENTIFICATION ERROR. PLEASE DISREGARD RESULTS. ACCOUNT WILL BE CREDITED. 102.0*  PLT 29* 31* 35* PATIENT IDENTIFICATION ERROR. PLEASE DISREGARD RESULTS. ACCOUNT WILL BE CREDITED. 36*   Basic Metabolic Panel: Recent Labs  Lab 11/07/23 0233 11/08/23 0411 11/08/23 2112 11/09/23 0345 11/10/23 0447 11/12/23 0240 11/13/23 0615  NA 141 142  --  139 137 137 139  K 3.9 3.9  --  4.4 4.0 3.9 3.5  CL 110 109  --  105 101 99 101  CO2 23 27  --  26 29 30 26   GLUCOSE 203* 168*  --  175* 119* 93 119*  BUN 24* 32*  --  35* 32* 39* 34*  CREATININE 0.78 0.68  --  0.61 0.56 0.59 0.64  CALCIUM  8.4* 8.7*  --  8.9 9.0 9.7 9.6  MG 2.2 1.5* 1.9 2.1 1.6* 1.8 1.6*  PHOS 2.7 1.5* 3.6 2.9  --   --  3.8   Liver Function Tests: Recent Labs  Lab 11/07/23 0233  AST 50*  ALT 37  ALKPHOS 95  BILITOT 0.6  PROT 5.8*  ALBUMIN 2.3*   No results for input(s): "LIPASE", "AMYLASE" in the last 168 hours. No results for input(s): "AMMONIA" in the last 168 hours. Cardiac Enzymes: No results for input(s):  "CKTOTAL", "CKMB", "CKMBINDEX", "TROPONINI" in the last 168 hours. BNP (last 3 results) No results for input(s): "BNP" in the last 8760 hours.  ProBNP (last 3 results) No results for input(s): "PROBNP" in the last 8760 hours.  CBG: Recent Labs  Lab 11/12/23 1515 11/12/23  2023 11/12/23 2348 11/13/23 0415 11/13/23 1136  GLUCAP 154* 218* 192* 121* 128*   Recent Results (from the past 240 hours)  Blood Culture (routine x 2)     Status: None   Collection Time: 11/05/23  4:33 PM   Specimen: BLOOD LEFT HAND  Result Value Ref Range Status   Specimen Description BLOOD LEFT HAND  Final   Special Requests   Final    BOTTLES DRAWN AEROBIC AND ANAEROBIC Blood Culture results may not be optimal due to an inadequate volume of blood received in culture bottles   Culture   Final    NO GROWTH 5 DAYS Performed at [facility], [address], [place], [place] [address]    Report Status 11/10/2023 FINAL  Final  Blood Culture (routine x 2)     Status: None   Collection Time: 11/05/23  5:09 PM   Specimen: BLOOD  Result Value Ref Range Status   Specimen Description BLOOD SITE NOT SPECIFIED  Final   Special Requests   Final    BOTTLES DRAWN AEROBIC ONLY Blood Culture results may not be optimal due to an inadequate volume of blood received in culture bottles   Culture   Final    NO GROWTH 5 DAYS Performed at [facility], [address] [place], [place] [address]    Report Status 11/10/2023 FINAL  Final  Resp panel by RT-PCR (RSV, Flu A&B, Covid) Anterior Nasal Swab     Status: None   Collection Time: 11/05/23  6:25 PM   Specimen: Anterior Nasal Swab  Result Value Ref Range Status   SARS Coronavirus 2 by RT PCR NEGATIVE NEGATIVE Final   Influenza A by PCR NEGATIVE NEGATIVE Final   Influenza B by PCR NEGATIVE NEGATIVE Final    Comment: (NOTE) The Xpert Xpress SARS-CoV-2/FLU/RSV plus assay is intended as an aid in the diagnosis of influenza from Nasopharyngeal swab specimens  and should not be used as a sole basis for treatment. Nasal washings and aspirates are unacceptable for Xpert Xpress SARS-CoV-2/FLU/RSV testing.  Fact Sheet for Patients: BloggerCourse.com  Fact Sheet for Healthcare Providers: SeriousBroker.it  This test is not yet approved or cleared by the [place]  FDA and has been authorized for detection and/or diagnosis of SARS-CoV-2 by FDA under an Emergency Use Authorization (EUA). This EUA will remain in effect (meaning this test can be used) for the duration of the COVID-19 declaration under Section 564(b)(1) of the Act, 21 U.S.C. section 360bbb-3(b)(1), unless the authorization is terminated or revoked.     Resp Syncytial Virus by PCR NEGATIVE NEGATIVE Final    Comment: (NOTE) Fact Sheet for Patients: BloggerCourse.com  Fact Sheet for Healthcare Providers: SeriousBroker.it  This test is not yet approved or cleared by the [place]  FDA and has been authorized for detection and/or diagnosis of SARS-CoV-2 by FDA under an Emergency Use Authorization (EUA). This EUA will remain in effect (meaning this test can be used) for the duration of the COVID-19 declaration under Section 564(b)(1) of the Act, 21 U.S.C. section 360bbb-3(b)(1), unless the authorization is terminated or revoked.  Performed at [facility], [address] [place], [place] [address]   MRSA Next Gen by PCR, Nasal     Status: None   Collection Time: 11/06/23  3:47 AM   Specimen: Nasal Mucosa; Nasal Swab  Result Value Ref Range Status   MRSA by PCR Next Gen NOT DETECTED NOT DETECTED Final    Comment: (NOTE) The GeneXpert  MRSA Assay (FDA approved for NASAL specimens only), is one component of a comprehensive MRSA colonization surveillance program. It is not intended to diagnose MRSA infection nor to guide or monitor treatment for MRSA infections. Test  performance is not FDA approved in patients less than 15 years old. Performed at [facility], [address] [place], [place] [address]   Urine Culture (for pregnant, neutropenic or urologic patients or patients with an indwelling urinary catheter)     Status: None   Collection Time: 11/07/23  9:18 AM   Specimen: Urine, Catheterized  Result Value Ref Range Status   Specimen Description URINE, CATHETERIZED  Final   Special Requests NONE  Final   Culture   Final    NO GROWTH Performed at [facility], [address] [place], [place] [address]    Report Status 11/08/2023 FINAL  Final     Studies: No results found.     Ariyana Faw, MD  [facility] 11/13/2023  If 7PM-7AM, please contact night-coverage

## 2023-11-13 NOTE — Plan of Care (Signed)
  Problem: Health Behavior/Discharge Planning: Goal: Ability to identify and utilize available resources and services will improve Outcome: Progressing Goal: Ability to manage health-related needs will improve Outcome: Progressing   Problem: Metabolic: Goal: Ability to maintain appropriate glucose levels will improve Outcome: Progressing   Problem: Clinical Measurements: Goal: Ability to maintain clinical measurements within normal limits will improve Outcome: Progressing Goal: Will remain free from infection Outcome: Progressing Goal: Diagnostic test results will improve Outcome: Progressing Goal: Respiratory complications will improve Outcome: Progressing Goal: Cardiovascular complication will be avoided Outcome: Progressing   Problem: Coping: Goal: Level of anxiety will decrease Outcome: Progressing

## 2023-11-13 NOTE — Progress Notes (Signed)
  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 She was d/c to facility 4/14 from [facility] after being treated for sepsis, UTI and she had a stage III sacral decubiti. She has a chronic Foley.    Palliative care has been asked to support additional goals of care conversations.   Today's Discussion 11/13/2023  *Please note that this is a verbal dictation therefore any spelling or grammatical errors are due to the "Dragon Medical One" system interpretation.  Chart reviewed inclusive of vital signs, progress notes, laboratory results, and diagnostic images. Patient assessed at the bedside.  She is resting comfortably.  Did not attempt to arouse in order to preserve comfort.  No family was present during my visit.  Called patient's husband [name] for ongoing goals of care discussions and palliative support.  He shares that at this time, conversations amongst the family are "on hold" and the general consensus is to give patient a few more days.  He had 2 molars pulled yesterday and he is taking care of himself so he has not seen [name] since Wednesday.  He declines any questions or concerns at this time.  Shares that patient's daughter and son-in-law plan to visit this afternoon and acknowledges that she is having a very hard time with patient's current situation and prognosis.  Questions and concerns addressed/Palliative Support Provided.   Objective Assessment: Vital Signs Vitals:   11/13/23 0419 11/13/23 0700  BP: (!) 119/56 (!) 109/96  Pulse: 100 (!) 111  Resp: 13 19  Temp: 98.4 F (36.9 C) 98.5 F (36.9 C)  SpO2: 99% 100%    Gen:  Elderly Caucasian F chronically ill appearing HEENT:  Coretrack CV: Regular rate and rhythm  PULM:  On RA, breathing is even and nonlabored ABD: soft/nontender/nondistended  EXT: (+) edema  Neuro: Sleeping  SUMMARY OF RECOMMENDATIONS   -DNAR/DNI with escalation of care to the ICU for pressors/antiarrhythmics/bipap if needed  -Continue current care plan and allow more time for outcomes - Family is still considering options and not ready for any decisions today -Psychosocial and emotional support provided -PMT will continue to follow and support  Time Spent: 35 minutes  Greer Wainright Arlester Ladd, PA-C [place] Palliative Medicine Team Team Cell Phone: [phone] Please utilize secure chat with additional questions, if there is no response within 30 minutes please call the above phone number  Palliative Medicine Team providers are available by phone from 7am to 7pm daily and can be reached through the team cell phone.  Should this patient require assistance outside of these hours, please call the patient's attending physician.

## 2023-11-13 NOTE — Progress Notes (Incomplete)
 PROGRESS NOTE    Dana Morton  WUJ:[id number] DOB: 18-Mar-1951 DOA: 11/05/2023 PCP: Melchor Spoon, MD    Brief Narrative:  73 y/o female with with past medical history of glioblastoma  s/p resection 05/2023 at [facility], Seizure disorder secondary to tumor/radiation , HTN, Hypothyroidism presented to hospital skilled nursing facility after sustaining a fall.  Patient was recently discharged from rehab facility on 4/14.  Patient was not taking her medications and food at home and had been bedbound.  According to husband, who was at the beside, she has been refusing her food and meds for several weeks.  In the ED patient was hypotensive and required vasopressors.  Lactic acid was 2.2 initially.  Creatinine was 3.2 from baseline 0.7.[name]  At this time patient has been transferred out of the ICU.    Sepsis, secondary to UTI. Patient initially received vasopressors.  Foley catheter was changed.  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  Urine culture with no growth.  Blood cultures negative in 4 days.  Afebrile at this time with a temperature max of 98.4 F.  AKI on CKD Improved at this time.  Latest creatinine of 0.6.  Acute respiratory failure Currently on room air.  Continue to monitor closely.   Sacral decub, unstageable present on admission Wound care recommending surgical intervention and possible debridement.   Acute metabolic encephalopathy H/o Seizures  History of glioblastoma s/p resection. Continue AEDs with Vimpat  and Keppra ..  Continue dexamethasone .   Severe Calorie malnutrition, present on admission Refeeding syndrome On cortrak tube and tube feeding.  Currently on Osmolite 1.5.  And Prosource   Anemia of chronic disease Thrombocytopenia Received 1 unit of packed RBC.  Continue to monitor closely.  Significant thrombocytopenia.   Left toe swelling Secondary to chronic gout.  Continue to monitor.  Hypothyroidism.  Continue Synthroid    Goals of  care ICU provider had spoken with the patient's family regarding declining course with glioblastoma and multiple hospitalizations.  Family still wishing full code for now.  Son had agreed on palliative care assessment but his sister and stepfather are also decision-makers.      Assessment and Plan: No notes have been filed under this hospital service. Service: Hospitalist     DVT prophylaxis: SCDs Start: 11/05/23 2151   Code Status:     Code Status: Limited: Do not attempt resuscitation (DNR) -DNR-LIMITED -Do Not Intubate/DNI   Disposition: *** Status is: Inpatient {Inpatient:23812}  Family Communication: ***  Consultants:  ***  Procedures:  ***  Antimicrobials:  ***  Anti-infectives (From admission, onward)    Start     Dose/Rate Route Frequency Ordered Stop   11/11/23 1600  valACYclovir  (VALTREX ) tablet 1,000 mg        1,000 mg Per Tube 3 times daily 11/11/23 1308 11/18/23 0959   11/11/23 1200  valACYclovir  (VALTREX ) tablet 1,000 mg  Status:  Discontinued        1,000 mg Oral 3 times daily 11/11/23 1107 11/11/23 1308   11/07/23 2300  vancomycin  (VANCOREADY) IVPB 750 mg/150 mL  Status:  Discontinued        750 mg 150 mL/hr over 60 Minutes Intravenous Every 48 hours 11/05/23 2234 11/07/23 0825   11/07/23 1000  vancomycin  (VANCOREADY) IVPB 1250 mg/250 mL  Status:  Discontinued        1,250 mg 166.7 mL/hr over 90 Minutes Intravenous Every 24 hours 11/07/23 0825 11/07/23 0834   11/07/23 1000  Vancomycin  (VANCOCIN ) 1,250 mg  in sodium chloride  0.9 % 250 mL IVPB  Status:  Discontinued        1,250 mg 166.7 mL/hr over 90 Minutes Intravenous Every 24 hours 11/07/23 0834 11/08/23 1433   11/07/23 0915  meropenem  (MERREM ) 1 g in sodium chloride  0.9 % 100 mL IVPB        1 g 200 mL/hr over 30 Minutes Intravenous Every 8 hours 11/07/23 0820 11/09/23 2159   11/05/23 2330  meropenem  (MERREM ) 500 mg in sodium chloride  0.9 % 100 mL IVPB  Status:  Discontinued        500 mg 200 mL/hr  over 30 Minutes Intravenous Every 12 hours 11/05/23 2153 11/07/23 0820   11/05/23 2215  vancomycin  (VANCOREADY) IVPB 2000 mg/400 mL        2,000 mg 200 mL/hr over 120 Minutes Intravenous  Once 11/05/23 2210 11/06/23 0045   11/05/23 2045  meropenem  (MERREM ) 1 g in sodium chloride  0.9 % 100 mL IVPB  Status:  Discontinued        1 g 200 mL/hr over 30 Minutes Intravenous  Once 11/05/23 2043 11/05/23 2214        Subjective: Today, patient was seen and examined at bedside.***  Objective: Vitals:   11/12/23 2027 11/13/23 0419 11/13/23 0700 11/13/23 1133  BP: (!) 105/51 (!) 119/56 (!) 109/96 109/60  Pulse: 93 100 (!) 111 (!) 105  Resp: 17 13 19 17   Temp: 98 F (36.7 C) 98.4 F (36.9 C) 98.5 F (36.9 C) 99.1 F (37.3 C)  TempSrc: Oral Oral Oral Oral  SpO2: 91% 99% 100% 94%  Weight:  97.1 kg    Height:        Intake/Output Summary (Last 24 hours) at 11/13/2023 1545 Last data filed at 11/13/2023 1200 Gross per 24 hour  Intake 1173.28 ml  Output 1125 ml  Net 48.28 ml   Filed Weights   11/11/23 0505 11/12/23 0500 11/13/23 0419  Weight: 103 kg 102.1 kg 97.1 kg    Physical Examination: *** General:  Average built, not in obvious distress HENT:   No scleral pallor or icterus noted. Oral mucosa is moist.  Chest:  Clear breath sounds.  Diminished breath sounds bilaterally. No crackles or wheezes.  CVS: S1 &S2 heard. No murmur.  Regular rate and rhythm. Abdomen: Soft, nontender, nondistended.  Bowel sounds are heard.   Extremities: No cyanosis, clubbing or edema.  Peripheral pulses are palpable. Psych: Alert, awake and oriented, normal mood CNS:  No cranial nerve deficits.  Power equal in all extremities.   Skin: Warm and dry.  No rashes noted.  Data Reviewed:   CBC: Recent Labs  Lab 11/09/23 0345 11/10/23 0447 11/12/23 0240 11/13/23 0452 11/13/23 0611  WBC 3.3* 3.5* 5.6 PATIENT IDENTIFICATION ERROR. PLEASE DISREGARD RESULTS. ACCOUNT WILL BE CREDITED. 5.5  HGB 7.6* 7.6*  8.2* PATIENT IDENTIFICATION ERROR. PLEASE DISREGARD RESULTS. ACCOUNT WILL BE CREDITED. 8.4*  HCT 23.0* 22.8* 24.8* PATIENT IDENTIFICATION ERROR. PLEASE DISREGARD RESULTS. ACCOUNT WILL BE CREDITED. 25.6*  MCV 102.2* 102.2* 102.5* PATIENT IDENTIFICATION ERROR. PLEASE DISREGARD RESULTS. ACCOUNT WILL BE CREDITED. 102.0*  PLT 29* 31* 35* PATIENT IDENTIFICATION ERROR. PLEASE DISREGARD RESULTS. ACCOUNT WILL BE CREDITED. 36*    Basic Metabolic Panel: Recent Labs  Lab 11/07/23 0233 11/08/23 0411 11/08/23 2112 11/09/23 0345 11/10/23 0447 11/12/23 0240 11/13/23 0615  NA 141 142  --  139 137 137 139  K 3.9 3.9  --  4.4 4.0 3.9 3.5  CL 110 109  --  105 101  99 101  CO2 23 27  --  26 29 30 26   GLUCOSE 203* 168*  --  175* 119* 93 119*  BUN 24* 32*  --  35* 32* 39* 34*  CREATININE 0.78 0.68  --  0.61 0.56 0.59 0.64  CALCIUM  8.4* 8.7*  --  8.9 9.0 9.7 9.6  MG 2.2 1.5* 1.9 2.1 1.6* 1.8 1.6*  PHOS 2.7 1.5* 3.6 2.9  --   --  3.8    Liver Function Tests: Recent Labs  Lab 11/07/23 0233  AST 50*  ALT 37  ALKPHOS 95  BILITOT 0.6  PROT 5.8*  ALBUMIN 2.3*     Radiology Studies: No results found.    LOS: 8 days    Time spent: 39 minutes spent on chart review, discussion with nursing staff, consultants, updating family and interview/physical exam; more than 50% of that time was spent in counseling and/or coordination of care.  Anani Gu, MD [facility] Available via Epic secure chat 7am-7pm After these hours, please refer to coverage provider listed on [email or web address] 11/13/2023, 3:45 PM

## 2023-11-13 NOTE — Plan of Care (Signed)

## 2023-11-13 NOTE — Progress Notes (Signed)
 Nutrition Follow-up  DOCUMENTATION CODES:   Severe malnutrition in context of acute illness/injury  INTERVENTION:  Pending diet advancement, modify tube feeding via cortrak tube: Osmolite 1.5 at 83 ml/h (996 ml per day) x12 hours (6PM-6AM) Prosource TF20 60 ml 2x/d   Provides 1680 kcal (93% of minimum estimated needs), 102 gm protein (107% of minimum estimated needs), 762 ml free water daily   Continue MVI with minerals Continue 1 packet Juven BID, each packet provides 95 calories, 2.5 grams of protein (collagen), + micronutrients to support wound healing Discontinue calorie count  Monitor speech notes for diet advancement/tolerance Continue Ensure Enlive po TID, each supplement provides 350 kcal and 20 grams of protein.  Continue Magic cup TID with meals, each supplement provides 290 kcal and 9 grams of protein   NUTRITION DIAGNOSIS:  Severe Malnutrition related to acute illness (recurrent hospitalizations and rehab stays) as evidenced by severe muscle depletion, moderate fat depletion, percent weight loss (10.5% x 3 months).  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 No issues. GOC discussions continue. Continue to recommend nutrition support for wound healing, if within GOC for patient.  Average Meal Intake No documentation to review  Calorie count ordered, however no documentation to review. Per RN report, patient continues with little to no oral intake. Remains on full liquid diet with TF regimen meeting >90% of her estimated needs. WOC consulted 2/2 foul odor emitting from wound.   Admit Weight: 97.1kg Current Weight: 103kg   Wt trending down. No significant edema noted. Bowels stable.     Intake/Output Summary (Last 24 hours) at 11/13/2023 1520 Last data filed at 11/13/2023 1200 Gross per 24 hour  Intake 1173.28 ml  Output 1125 ml  Net 48.28 ml    Net IO Since Admission: -543.1 mL [11/13/23 1520]    Drains/Lines: Cortrak (65cm) PICC (placed 4/23) Foley catheter (chronic) UOP: 1.3L x24 hours   Repletion continues to be required. Magnesium  ordered today. MVI continues.    Meds: famotidine , SSI 0-15 q4, levothyroxine , MVI  Drips: 2g Mg sulfate x1   Labs reviewed from 4/29:  Na+ 137 (wdl) K+ 6.3>3.4--->4.0 (wdl) PHOS 2.7>1.5--->2.9 (wdl) Mg 2.2>1.5--->1.6 (L) CBGs 119-175 x24 hours A1c 6.7 (07/2023)  Diet Order:   Diet Order             Diet full liquid Room service appropriate? Yes with Assist; Fluid consistency: Thin  Diet effective now             EDUCATION NEEDS:  Education needs have been addressed  Skin:  Skin Assessment: Skin Integrity Issues: Skin Integrity Issues:: Stage II Stage II: R buttocks Unstageable: buttocks  Last BM:  5/2 -  type 4 x1  Height:  Ht Readings from Last 1 Encounters:  11/05/23 5\' 7"  (1.702 m)   Weight:  Wt Readings from Last 1 Encounters:  11/13/23 97.1 kg   Ideal Body Weight:  61.4 kg  BMI:  Body mass index is 33.52 kg/m.  Estimated Nutritional Needs:   Kcal:  1800-2000 kcal/d  Protein:  95-110 g/d  Fluid:   2L/d  Con Decant MS, RD, LDN Registered Dietitian Clinical Nutrition RD Inpatient Contact Info in Amion

## 2023-11-14 DIAGNOSIS — E43 Unspecified severe protein-calorie malnutrition: Secondary | ICD-10-CM | POA: Diagnosis not present

## 2023-11-14 DIAGNOSIS — Z66 Do not resuscitate: Secondary | ICD-10-CM | POA: Diagnosis not present

## 2023-11-14 DIAGNOSIS — I959 Hypotension, unspecified: Secondary | ICD-10-CM

## 2023-11-14 DIAGNOSIS — Z515 Encounter for palliative care: Secondary | ICD-10-CM | POA: Diagnosis not present

## 2023-11-14 DIAGNOSIS — A419 Sepsis, unspecified organism: Secondary | ICD-10-CM | POA: Diagnosis not present

## 2023-11-14 DIAGNOSIS — R4182 Altered mental status, unspecified: Secondary | ICD-10-CM | POA: Diagnosis not present

## 2023-11-14 DIAGNOSIS — Z7189 Other specified counseling: Secondary | ICD-10-CM | POA: Diagnosis not present

## 2023-11-14 LAB — GLUCOSE, CAPILLARY
Glucose-Capillary: 114 mg/dL — ABNORMAL HIGH (ref 70–99)
Glucose-Capillary: 115 mg/dL — ABNORMAL HIGH (ref 70–99)
Glucose-Capillary: 155 mg/dL — ABNORMAL HIGH (ref 70–99)
Glucose-Capillary: 170 mg/dL — ABNORMAL HIGH (ref 70–99)
Glucose-Capillary: 206 mg/dL — ABNORMAL HIGH (ref 70–99)
Glucose-Capillary: 217 mg/dL — ABNORMAL HIGH (ref 70–99)

## 2023-11-14 LAB — BASIC METABOLIC PANEL WITH GFR
Anion gap: 8 (ref 5–15)
BUN: 41 mg/dL — ABNORMAL HIGH (ref 8–23)
CO2: 28 mmol/L (ref 22–32)
Calcium: 9.4 mg/dL (ref 8.9–10.3)
Chloride: 101 mmol/L (ref 98–111)
Creatinine, Ser: 0.64 mg/dL (ref 0.44–1.00)
GFR, Estimated: >60 mL/min
Glucose, Bld: 142 mg/dL — ABNORMAL HIGH (ref 70–99)
Potassium: 3.8 mmol/L (ref 3.5–5.1)
Sodium: 137 mmol/L (ref 135–145)

## 2023-11-14 LAB — CBC
HCT: 23.5 % — ABNORMAL LOW (ref 36.0–46.0)
Hemoglobin: 7.4 g/dL — ABNORMAL LOW (ref 12.0–15.0)
MCH: 33.6 pg (ref 26.0–34.0)
MCHC: 31.5 g/dL (ref 30.0–36.0)
MCV: 106.8 fL — ABNORMAL HIGH (ref 80.0–100.0)
Platelets: 36 10*3/uL — ABNORMAL LOW (ref 150–400)
RBC: 2.2 MIL/uL — ABNORMAL LOW (ref 3.87–5.11)
RDW: 21.5 % — ABNORMAL HIGH (ref 11.5–15.5)
WBC: 4.6 10*3/uL (ref 4.0–10.5)
nRBC: 1.3 % — ABNORMAL HIGH (ref 0.0–0.2)

## 2023-11-14 LAB — MAGNESIUM: Magnesium: 1.8 mg/dL (ref 1.7–2.4)

## 2023-11-14 MED ORDER — MIDODRINE HCL 5 MG PO TABS
5.0000 mg | ORAL_TABLET | Freq: Three times a day (TID) | ORAL | Status: DC
  Administered 2023-11-14 – 2023-11-17 (×8): 5 mg
  Filled 2023-11-14 (×9): qty 1

## 2023-11-14 NOTE — Plan of Care (Signed)

## 2023-11-14 NOTE — Progress Notes (Signed)
  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 She was d/c to facility 4/14 from [facility] after being treated for sepsis, UTI and she had a stage III sacral decubiti. She has a chronic Foley.    Palliative care has been asked to support additional goals of care conversations.   Today's Discussion 11/14/2023  *Please note that this is a verbal dictation therefore any spelling or grammatical errors are due to the "Dragon Medical One" system interpretation.  Chart reviewed inclusive of vital signs, progress notes, laboratory results, and diagnostic images. Patient assessed at the bedside.  Discussed with patient's daughter and son, daughter-in-law and son-in-law at the bedside in the morning.  I later had a phone conversation with patient's husband in the afternoon.  Created space and opportunity for family's thoughts and feelings on patient's current illness.  Husband shares his understanding that patient's survival seems to be "doubtful" and laments that she is no longer herself.  At the bedside, patient's daughter shares that she is beginning to come to terms with her current level suffering and poor overall condition.  Emotional support and therapeutic listening was provided as she reviewed how hard it has been for her to see her on her good days, while others have seen her on her worst days.  After seeing her restless and agitated yesterday, she would never want patient to have to live this way.  We discussed the option of comfort focused care in the hospital in detail.  We discussed the hospice referral process as well.  Will continue discussing as a family and let PMT know of their decision once  finalized.  Questions and concerns addressed/Palliative Support Provided.   Objective Assessment: Vital Signs Vitals:   11/14/23 0358 11/14/23 0856  BP: (!) 132/57 (!) 151/66  Pulse: 91 (!) 111  Resp: 18 20  Temp: 98.6 F (37 C) 98.6 F (37 C)  SpO2: 94% 98%    Gen:  Elderly Caucasian F chronically ill appearing HEENT: [place] CV: Regular rate and rhythm  PULM:  On RA, breathing is even and nonlabored ABD: soft/nontender/nondistended  EXT: (+) edema  Neuro: Sleeping  SUMMARY OF RECOMMENDATIONS   -DNAR/DNI  -Continue current care plan  -Family is reflecting on patient's worsening suffering and seem to be leaning towards considering a more comfort focused approach/hospice facility Ongoing goals of care discussions -Psychosocial and emotional support provided -PMT will continue to follow and support  MDM: High  Demoni Parmar, PA-C [facility] Team Team Cell Phone: [phone] Please utilize secure chat with additional questions, if there is no response within 30 minutes please call the above phone number  Palliative Medicine Team providers are available by phone from 7am to 7pm daily and can be reached through the team cell phone.  Should this patient require assistance outside of these hours, please call the patient's attending physician.

## 2023-11-14 NOTE — Progress Notes (Signed)
 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  Patient was recently discharged from rehab facility on 4/14.  Patient was not taking her medications and food at home and had been bedbound.  According to husband, who was at the beside, she has been refusing her food and meds for several weeks.  In the ED, patient was hypotensive and required vasopressors.  Lactic acid was 2.2 initially.  Creatinine was 3.2 from baseline 0.7.[name]  At this time, patient has been transferred out of the ICU.    Assessment/Plan: Principal Problem:   Sepsis (HCC) Active Problems:   Protein-calorie malnutrition, severe  Sepsis with septic shock secondary to UTI. Resolved.  Patient initially received vasopressors.  Foley catheter was changed.  Initially received vancomycin  and meropenem  due to history of ESBL.  Subsequently was on meropenem  and has completed the course.  Urine culture with no growth.  Blood cultures negative in > 5 days.  Afebrile at this time with a temperature max of 98.5. F.  Latest blood pressure at 109/96, on midodrine  as well.  Will cut down the dose of midodrine  to 5 mg 3 times daily for blood pressure.  AKI on CKD Improved at this time.  Latest creatinine of 0.6.  Acute respiratory failure Currently on room air.  Continue to monitor closely.  On room air.   Sacral decub, left unstageable present on admission, right buttocks stage II ulceration present on admission Pressure Injury 10/20/23 Buttocks Right Stage 2 -  Partial thickness loss of dermis presenting as a shallow open injury with a red, pink wound bed without slough. the area is pink in the right buttocks  (Active)  10/20/23 1500  Location: Buttocks  Location Orientation: Right  Staging: Stage 2 -  Partial thickness loss of dermis presenting as a shallow open injury with a red, pink wound bed without slough.  Wound Description (Comments): the area is pink in the right buttocks  Present on Admission: Yes     Pressure Injury 11/06/23 Buttocks Left Unstageable - Full thickness tissue loss in which the base of the injury is covered by slough (yellow, tan, gray, green or brown) and/or eschar (tan, brown or black) in the wound bed. Full tissue loss in which base  (Active)  11/06/23 0430  Location: Buttocks  Location Orientation: Left  Staging: Unstageable - Full thickness tissue loss in which the base of the injury is covered by slough (yellow, tan, gray, green or brown) and/or eschar (tan, brown or black) in the wound bed.  Wound Description (Comments): Full tissue loss in which base of injury covered by slough  Present on Admission: Yes    Wound care recommending surgical intervention and possible debridement in a nonurgent basis.   Acute metabolic encephalopathy H/o Seizures  History of glioblastoma s/p resection. Continue AEDs with Vimpat  and Keppra ..  Continue dexamethasone .  mildly Communicative with difficult understand speech.  Trying to get out of the bed.  Distinct speech.  Severe Calorie malnutrition, present on admission Body mass index is 33.67 kg/m.  Refeeding syndrome Nutrition Status: Nutrition Problem: Severe Malnutrition Etiology: acute illness (recurrent hospitalizations and rehab stays) Signs/Symptoms: severe muscle depletion, moderate fat depletion, percent weight loss (10.5%  x 3 months) Percent weight loss: 10.5 % Interventions: Juven, MVI, Prostat, Tube feeding   On cortrak tube and tube feeding.  Currently on Osmolite 1.5 and Prosource.  Hypomagnesemia.  Magnesium  of 1.8.  After replacement.     Anemia of chronic disease Thrombocytopenia Received 1 unit of  packed RBC.  Continue to monitor closely.  Significant thrombocytopenia with platelet count of 36..  Latest hemoglobin of 7.4   Left toe swelling Secondary to chronic gout.  Continue to monitor.  Hypothyroidism.  Continue Synthroid   Class I obesity.Body mass index is 33.67 kg/m.  Nutrition on board.  On tube feeding at this time.   Goals of care I again had a prolonged discussion with the patient's daughter at bedside on 11/14/2023 palliative care following.  Family to decide about further goals of care as clinical course progresses.  Concern for Herpes Zoster continue Valtrex  1000 g 3 times daily for 7 days.  On airborne isolation precautions.  DVT prophylaxis: SCDs Start: 11/05/23 2151  Disposition: Uncertain at this time.  Goals of care discussion underway.  Likely to skilled nursing facility if improved   Status is: Inpatient Remains inpatient appropriate because: Pending clinical improvement, cortrak tube tube feeding, encephalopathy    Code Status:     Code Status: Limited: Do not attempt resuscitation (DNR) -DNR-LIMITED -Do Not Intubate/DNI   Family Communication:  Spoke with the patient's daughter at bedside 11/14/2023  Consultants: Palliative care PCCM General Surgery  Procedures: Cortrak tube tube feeding. EEG  Anti-infectives:   Valtrex .  Anti-infectives (From admission, onward)    Start     Dose/Rate Route Frequency Ordered Stop   11/11/23 1600  valACYclovir  (VALTREX ) tablet 1,000 mg        1,000 mg Per Tube 3 times daily 11/11/23 1308 11/18/23 0959   11/11/23 1200  valACYclovir  (VALTREX ) tablet 1,000 mg  Status:  Discontinued        1,000 mg Oral 3 times daily 11/11/23 1107 11/11/23 1308   11/07/23 2300  vancomycin  (VANCOREADY) IVPB 750 mg/150 mL  Status:  Discontinued        750 mg 150 mL/hr over 60 Minutes Intravenous Every 48 hours 11/05/23 2234 11/07/23 0825   11/07/23 1000  vancomycin  (VANCOREADY) IVPB 1250 mg/250 mL  Status:  Discontinued         1,250 mg 166.7 mL/hr over 90 Minutes Intravenous Every 24 hours 11/07/23 0825 11/07/23 0834   11/07/23 1000  Vancomycin  (VANCOCIN ) 1,250 mg in sodium chloride  0.9 % 250 mL IVPB  Status:  Discontinued        1,250 mg 166.7 mL/hr over 90 Minutes Intravenous Every 24 hours 11/07/23 0834 11/08/23 1433   11/07/23 0915  meropenem  (MERREM ) 1 g in sodium chloride  0.9 % 100 mL IVPB        1 g 200 mL/hr over 30 Minutes Intravenous Every 8 hours 11/07/23 0820 11/09/23 2159   11/05/23 2330  meropenem  (MERREM ) 500 mg in sodium chloride  0.9 % 100 mL IVPB  Status:  Discontinued        500 mg 200 mL/hr over 30 Minutes Intravenous Every 12 hours 11/05/23 2153 11/07/23 0820   11/05/23 2215  vancomycin  (VANCOREADY) IVPB 2000 mg/400 mL        2,000 mg 200 mL/hr over 120 Minutes Intravenous  Once 11/05/23 2210 11/06/23 0045   11/05/23 2045  meropenem  (MERREM ) 1 g in sodium chloride  0.9 % 100 mL IVPB  Status:  Discontinued        1  g 200 mL/hr over 30 Minutes Intravenous  Once 11/05/23 2043 11/05/23 2214       Subjective:  Today, patient was seen and examined at bedside.  Mildly interactive but difficult to understand speech.  Tried to get out of the bed at times.  Not much interval changes noted.  Objective: Vitals:   11/14/23 0856 11/14/23 1237  BP: (!) 151/66 (!) 103/48  Pulse: (!) 111 (!) 106  Resp: 20 14  Temp: 98.6 F (37 C) 97.8 F (36.6 C)  SpO2: 98% 97%    Intake/Output Summary (Last 24 hours) at 11/14/2023 1423 Last data filed at 11/14/2023 0350 Gross per 24 hour  Intake 309 ml  Output 700 ml  Net -391 ml   Filed Weights   11/12/23 0500 11/13/23 0419 11/14/23 0358  Weight: 102.1 kg 97.1 kg 97.5 kg   Body mass index is 33.67 kg/m.   Physical Exam:  GENERAL: Patient is alert awake, indistinct speech, mildly interactive,. Not in obvious distress.  Obese build.  Cortrak tube tube in place.  Appears deconditioned and weak. HENT: No scleral pallor or icterus. Pupils equally reactive  to light. Oral mucosa is moist NECK: is supple, no gross swelling noted. CHEST:   Diminished breath sounds bilaterally. CVS: S1 and S2 heard, no murmur. Regular rate and rhythm.  ABDOMEN: Soft, non-tender, bowel sounds are present. EXTREMITIES: No edema.  Right upper extremity PICC line in place. CNS: Indistinct speech.  Moves extremities.  Generalized weakness noted. SKIN: warm and dry, erythromatous  rash on the back noted on 11/09/2023, sacral ulcer present on admission.      Data Review: I have personally reviewed the following laboratory data and studies,  CBC: Recent Labs  Lab 11/10/23 0447 11/12/23 0240 11/13/23 0452 11/13/23 0611 11/14/23 0420  WBC 3.5* 5.6 PATIENT IDENTIFICATION ERROR. PLEASE DISREGARD RESULTS. ACCOUNT WILL BE CREDITED. 5.5 4.6  HGB 7.6* 8.2* PATIENT IDENTIFICATION ERROR. PLEASE DISREGARD RESULTS. ACCOUNT WILL BE CREDITED. 8.4* 7.4*  HCT 22.8* 24.8* PATIENT IDENTIFICATION ERROR. PLEASE DISREGARD RESULTS. ACCOUNT WILL BE CREDITED. 25.6* 23.5*  MCV 102.2* 102.5* PATIENT IDENTIFICATION ERROR. PLEASE DISREGARD RESULTS. ACCOUNT WILL BE CREDITED. 102.0* 106.8*  PLT 31* 35* PATIENT IDENTIFICATION ERROR. PLEASE DISREGARD RESULTS. ACCOUNT WILL BE CREDITED. 36* 36*   Basic Metabolic Panel: Recent Labs  Lab 11/08/23 0411 11/08/23 2112 11/09/23 0345 11/10/23 0447 11/12/23 0240 11/13/23 0615 11/14/23 0420  NA 142  --  139 137 137 139 137  K 3.9  --  4.4 4.0 3.9 3.5 3.8  CL 109  --  105 101 99 101 101  CO2 27  --  26 29 30 26 28   GLUCOSE 168*  --  175* 119* 93 119* 142*  BUN 32*  --  35* 32* 39* 34* 41*  CREATININE 0.68  --  0.61 0.56 0.59 0.64 0.64  CALCIUM  8.7*  --  8.9 9.0 9.7 9.6 9.4  MG 1.5* 1.9 2.1 1.6* 1.8 1.6* 1.8  PHOS 1.5* 3.6 2.9  --   --  3.8  --    Liver Function Tests: No results for input(s): "AST", "ALT", "ALKPHOS", "BILITOT", "PROT", "ALBUMIN" in the last 168 hours.  No results for input(s): "LIPASE", "AMYLASE" in the last 168  hours. No results for input(s): "AMMONIA" in the last 168 hours. Cardiac Enzymes: No results for input(s): "CKTOTAL", "CKMB", "CKMBINDEX", "TROPONINI" in the last 168 hours. BNP (last 3 results) No results for input(s): "BNP" in the last 8760 hours.  ProBNP (last 3 results) No results  for input(s): "PROBNP" in the last 8760 hours.  CBG: Recent Labs  Lab 11/13/23 2010 11/14/23 0009 11/14/23 0350 11/14/23 0849 11/14/23 1230  GLUCAP 212* 206* 170* 155* 115*   Recent Results (from the past 240 hours)  Blood Culture (routine x 2)     Status: None   Collection Time: 11/05/23  4:33 PM   Specimen: BLOOD LEFT HAND  Result Value Ref Range Status   Specimen Description BLOOD LEFT HAND  Final   Special Requests   Final    BOTTLES DRAWN AEROBIC AND ANAEROBIC Blood Culture results may not be optimal due to an inadequate volume of blood received in culture bottles   Culture   Final    NO GROWTH 5 DAYS Performed at [facility], [address], [place], [place] [address]    Report Status 11/10/2023 FINAL  Final  Blood Culture (routine x 2)     Status: None   Collection Time: 11/05/23  5:09 PM   Specimen: BLOOD  Result Value Ref Range Status   Specimen Description BLOOD SITE NOT SPECIFIED  Final   Special Requests   Final    BOTTLES DRAWN AEROBIC ONLY Blood Culture results may not be optimal due to an inadequate volume of blood received in culture bottles   Culture   Final    NO GROWTH 5 DAYS Performed at [facility], [address], [place], [place] [address]    Report Status 11/10/2023 FINAL  Final  Resp panel by RT-PCR (RSV, Flu A&B, Covid) Anterior Nasal Swab     Status: None   Collection Time: 11/05/23  6:25 PM   Specimen: Anterior Nasal Swab  Result Value Ref Range Status   SARS Coronavirus 2 by RT PCR NEGATIVE NEGATIVE Final   Influenza A by PCR NEGATIVE NEGATIVE Final   Influenza B by PCR NEGATIVE NEGATIVE Final    Comment: (NOTE) The Xpert Xpress  SARS-CoV-2/FLU/RSV plus assay is intended as an aid in the diagnosis of influenza from Nasopharyngeal swab specimens and should not be used as a sole basis for treatment. Nasal washings and aspirates are unacceptable for Xpert Xpress SARS-CoV-2/FLU/RSV testing.  Fact Sheet for Patients: BloggerCourse.com  Fact Sheet for Healthcare Providers: SeriousBroker.it  This test is not yet approved or cleared by the [place]  FDA and has been authorized for detection and/or diagnosis of SARS-CoV-2 by FDA under an Emergency Use Authorization (EUA). This EUA will remain in effect (meaning this test can be used) for the duration of the COVID-19 declaration under Section 564(b)(1) of the Act, 21 U.S.C. section 360bbb-3(b)(1), unless the authorization is terminated or revoked.     Resp Syncytial Virus by PCR NEGATIVE NEGATIVE Final    Comment: (NOTE) Fact Sheet for Patients: BloggerCourse.com  Fact Sheet for Healthcare Providers: SeriousBroker.it  This test is not yet approved or cleared by the [place]  FDA and has been authorized for detection and/or diagnosis of SARS-CoV-2 by FDA under an Emergency Use Authorization (EUA). This EUA will remain in effect (meaning this test can be used) for the duration of the COVID-19 declaration under Section 564(b)(1) of the Act, 21 U.S.C. section 360bbb-3(b)(1), unless the authorization is terminated or revoked.  Performed at [facility], [address], [place], [place] [address]   MRSA Next Gen by PCR, Nasal     Status: None   Collection Time: 11/06/23  3:47 AM   Specimen: Nasal Mucosa; Nasal Swab  Result Value Ref Range Status  MRSA by PCR Next Gen NOT DETECTED NOT DETECTED Final    Comment: (NOTE) The GeneXpert MRSA Assay (FDA approved for NASAL specimens only), is one component of a comprehensive MRSA colonization  surveillance program. It is not intended to diagnose MRSA infection nor to guide or monitor treatment for MRSA infections. Test performance is not FDA approved in patients less than 74 years old. Performed at [facility], [address], [place], [place] [address]   Urine Culture (for pregnant, neutropenic or urologic patients or patients with an indwelling urinary catheter)     Status: None   Collection Time: 11/07/23  9:18 AM   Specimen: Urine, Catheterized  Result Value Ref Range Status   Specimen Description URINE, CATHETERIZED  Final   Special Requests NONE  Final   Culture   Final    NO GROWTH Performed at [facility], [address] [place], [place] [address]    Report Status 11/08/2023 FINAL  Final     Studies: No results found.     Lailana Shira, MD  [facility] 11/14/2023  If 7PM-7AM, please contact night-coverage

## 2023-11-15 DIAGNOSIS — Z515 Encounter for palliative care: Secondary | ICD-10-CM | POA: Diagnosis not present

## 2023-11-15 DIAGNOSIS — E43 Unspecified severe protein-calorie malnutrition: Secondary | ICD-10-CM | POA: Diagnosis not present

## 2023-11-15 DIAGNOSIS — R4182 Altered mental status, unspecified: Secondary | ICD-10-CM | POA: Diagnosis not present

## 2023-11-15 DIAGNOSIS — A419 Sepsis, unspecified organism: Secondary | ICD-10-CM | POA: Diagnosis not present

## 2023-11-15 LAB — GLUCOSE, CAPILLARY
Glucose-Capillary: 122 mg/dL — ABNORMAL HIGH (ref 70–99)
Glucose-Capillary: 156 mg/dL — ABNORMAL HIGH (ref 70–99)
Glucose-Capillary: 164 mg/dL — ABNORMAL HIGH (ref 70–99)
Glucose-Capillary: 172 mg/dL — ABNORMAL HIGH (ref 70–99)
Glucose-Capillary: 184 mg/dL — ABNORMAL HIGH (ref 70–99)
Glucose-Capillary: 187 mg/dL — ABNORMAL HIGH (ref 70–99)
Glucose-Capillary: 202 mg/dL — ABNORMAL HIGH (ref 70–99)

## 2023-11-15 LAB — BASIC METABOLIC PANEL WITH GFR
Anion gap: 7 (ref 5–15)
BUN: 43 mg/dL — ABNORMAL HIGH (ref 8–23)
CO2: 28 mmol/L (ref 22–32)
Calcium: 9.1 mg/dL (ref 8.9–10.3)
Chloride: 103 mmol/L (ref 98–111)
Creatinine, Ser: 0.63 mg/dL (ref 0.44–1.00)
GFR, Estimated: >60 mL/min (ref >60)
Glucose, Bld: 191 mg/dL — ABNORMAL HIGH (ref 70–99)
Potassium: 4.2 mmol/L (ref 3.5–5.1)
Sodium: 138 mmol/L (ref 135–145)

## 2023-11-15 LAB — MAGNESIUM: Magnesium: 1.6 mg/dL — ABNORMAL LOW (ref 1.7–2.4)

## 2023-11-15 LAB — CBC
HCT: 22.1 % — ABNORMAL LOW (ref 36.0–46.0)
Hemoglobin: 7.2 g/dL — ABNORMAL LOW (ref 12.0–15.0)
MCH: 34.6 pg — ABNORMAL HIGH (ref 26.0–34.0)
MCHC: 32.6 g/dL (ref 30.0–36.0)
MCV: 106.3 fL — ABNORMAL HIGH (ref 80.0–100.0)
Platelets: 35 10*3/uL — ABNORMAL LOW (ref 150–400)
RBC: 2.08 MIL/uL — ABNORMAL LOW (ref 3.87–5.11)
RDW: 21.6 % — ABNORMAL HIGH (ref 11.5–15.5)
WBC: 4.5 10*3/uL (ref 4.0–10.5)
nRBC: 1.6 % — ABNORMAL HIGH (ref 0.0–0.2)

## 2023-11-15 MED ORDER — MAGNESIUM SULFATE 2 GM/50ML IV SOLN
2.0000 g | Freq: Once | INTRAVENOUS | Status: AC
  Administered 2023-11-15: 2 g via INTRAVENOUS
  Filled 2023-11-15: qty 50

## 2023-11-15 NOTE — Plan of Care (Signed)
  Problem: Education: Goal: Individualized Educational Video(s) Outcome: Progressing   Problem: Coping: Goal: Ability to adjust to condition or change in health will improve Outcome: Progressing   Problem: Fluid Volume: Goal: Ability to maintain a balanced intake and output will improve Outcome: Progressing

## 2023-11-15 NOTE — Progress Notes (Signed)
  Palliative Medicine Inpatient Follow Up Note HPI: 73 y/o female with PMH for Glioblastoma (causing aphasia and word finding) s/p resection 05/2023 at [facility], Seizure d/o secondary to tumor/radiation (mostly Absense type seizures), HTN, Hypothyroidism who was admitted to [facility] 4/8 for fall from bed at the rehab/nursing facility where she lives since her resection. She was d/c to facility 4/14 from [facility] after being treated for sepsis, UTI and she had a stage III sacral decubiti. She has a chronic Foley.    Palliative care has been asked to support additional goals of care conversations.   Today's Discussion 11/15/2023  *Please note that this is a verbal dictation therefore any spelling or grammatical errors are due to the "Dragon Medical One" system interpretation.  Chart reviewed inclusive of vital signs, progress notes, laboratory results, and diagnostic images. Patient assessed at the bedside.  She was resting comfortably, easily aroused and redirected.  Assisted with repositioning her gown.  No family was present and I later had a phone conversation with patient's husband.  Created space and opportunity for family's thoughts and feelings on patient's current illness.  Husband shares that he was disheartened when he attempted to visit today, as he was unaware there was no valet on Sunday and he was unable to make it inside from the parking deck due to his leg weakness problems.  He does not want anyone to feel like he is avoiding the decision-making process and reassures me that family has been discussing regularly.  They are leaning towards a decision and struggling with the concept of how much time to give her an opportunity to survive before transitioning to hospice.  He also understands there is no need to continue with hospitalization and is not helping with her quality of life or overall improvement of functioning.  He plans to come visit in the morning and is agreeable to another meeting with this PA.   He shares that they are planning to have a decision by next week, around Wednesday or Thursday.  Questions and concerns addressed/Palliative Support Provided.   Objective Assessment: Vital Signs Vitals:   11/15/23 0345 11/15/23 0804  BP: (!) 105/56 (!) 113/91  Pulse: 95 (!) 112  Resp: 16 18  Temp: 98.7 F (37.1 C) 98.6 F (37 C)  SpO2: 93% 99%    Gen:  Elderly Caucasian F chronically ill appearing HEENT: [place] CV: Regular rate and rhythm  PULM:  On RA, breathing is even and nonlabored ABD: soft/nontender/nondistended  EXT: (+) edema  Neuro: Sleeping  SUMMARY OF RECOMMENDATIONS   -DNAR/DNI  -Continue current care plan  -Family is reflecting on patient's worsening suffering and seem to be leaning towards considering a more comfort focused approach/hospice facility -Meet again 5/5 AM for ongoing goals of care discussions -Psychosocial and emotional support provided -PMT will continue to follow and support    Zayda Angell, PA-C [place] Palliative Medicine Team Team Cell Phone: [phone] Please utilize secure chat with additional questions, if there is no response within 30 minutes please call the above phone number  Palliative Medicine Team providers are available by phone from 7am to 7pm daily and can be reached through the team cell phone.  Should this patient require assistance outside of these hours, please call the patient's attending physician.

## 2023-11-15 NOTE — Plan of Care (Signed)

## 2023-11-15 NOTE — Progress Notes (Signed)
 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  Patient was recently discharged from rehab facility on 4/14.  Patient was not taking her medications and food at home and had been bedbound.  According to husband, who was at the beside, she has been refusing her food and meds for several weeks.  In the ED, patient was hypotensive and required vasopressors.  Lactic acid was 2.2 initially.  Creatinine was 3.2 from baseline 0.7.[name]  At this time, patient has been transferred out of the ICU.    Assessment/Plan: Principal Problem:   Sepsis (HCC) Active Problems:   Protein-calorie malnutrition, severe  Sepsis with septic shock secondary to UTI. Resolved.  Patient initially received vasopressors.  Foley catheter was changed.  Initially received vancomycin  and meropenem  due to history of ESBL.  Subsequently was on meropenem  and has completed the course.  Urine culture with no growth.  Blood cultures negative in > 5 days.  Afebrile at this time with a temperature max of 98.5. F.  Latest blood pressure at 109/96, on midodrine  as well.  Have decreased the dose of midodrine  to 5 mg 3 times daily for blood pressure.   Acute metabolic encephalopathy H/o Seizures  History of glioblastoma s/p resection. Continue AEDs with Vimpat  and Keppra ..  Continue dexamethasone .  mildly Communicative with difficult understand speech.  Trying to get out of the bed.  Still with mumbling indistinct speech.  Mentation has not improved.  AKI on CKD Improved at this time.  Latest creatinine of 0.6.  Acute respiratory failure Currently on room air.  Continue to monitor closely.  On room air.   Sacral  decub, left unstageable present on admission, right buttocks stage II ulceration present on admission Pressure Injury 10/20/23 Buttocks Right Stage 2 -  Partial thickness loss of dermis presenting as a shallow open injury with a red, pink wound bed without slough. the area is pink in the right buttocks (Active)  10/20/23 1500  Location: Buttocks  Location Orientation: Right  Staging: Stage 2 -  Partial thickness loss of dermis presenting as a shallow open injury with a red, pink wound bed without slough.  Wound Description (Comments): the area is pink in the right buttocks  Present on Admission: Yes     Pressure Injury 11/06/23 Buttocks Left Unstageable - Full thickness tissue loss in which the base of the injury is covered by slough (yellow, tan, gray, green or brown) and/or eschar (tan, brown or black) in the wound bed. Full tissue loss in which base  (Active)  11/06/23 0430  Location: Buttocks  Location Orientation: Left  Staging: Unstageable - Full thickness tissue loss in which the base of the injury is covered by slough (yellow, tan, gray, green or brown) and/or eschar (tan, brown or black) in the wound bed.  Wound Description (Comments): Full tissue loss in which base of injury covered by slough  Present on Admission: Yes   Wound care recommending surgical intervention and possible debridement in a nonurgent basis.  Severe Calorie malnutrition, present on admission Body mass index is 33.67 kg/m.  Refeeding syndrome Nutrition Status: Nutrition Problem: Severe Malnutrition Etiology: acute illness (recurrent hospitalizations and rehab stays) Signs/Symptoms: severe muscle depletion, moderate  fat depletion, percent weight loss (10.5% x 3 months) Percent weight loss: 10.5 % Interventions: Juven, MVI, Prostat, Tube feeding  On cortrak tube and tube feeding.    Hypomagnesemia.  Magnesium  of 1.6.  Will replace with 2 g of IV magnesium  sulfate today.   Anemia of chronic  disease Thrombocytopenia Received 1 unit of packed RBC.  Continue to monitor closely.  Significant thrombocytopenia with platelet count of 36..  Latest hemoglobin of 7.2   Left toe swelling Secondary to chronic gout.  Continue to monitor.  Hypothyroidism.  Continue Synthroid   Class I obesity.Body mass index is 33.67 kg/m.  Nutrition on board.  On tube feeding at this time.   Concern for Herpes Zoster continue Valtrex  1000 g 3 times daily for 7 days.  On airborne isolation precautions.  Goals of care I again had a prolonged discussion with the patient's daughter at bedside on 11/14/2023, palliative care following.  Family to decide about further goals of care as clinical course progresses.   DVT prophylaxis: SCDs Start: 11/05/23 2151  Disposition: .  Goals of care discussion underway.  Likely to skilled nursing facility if improved   Status is: Inpatient Remains inpatient appropriate because: Pending clinical improvement, cortrak tube tube feeding, ongoing encephalopathy    Code Status:     Code Status: Limited: Do not attempt resuscitation (DNR) -DNR-LIMITED -Do Not Intubate/DNI   Family Communication:  Spoke with the patient's daughter at bedside 11/14/2023  Consultants: Palliative care PCCM General Surgery  Procedures: Cortrak tube tube feeding. EEG  Anti-infectives:   Valtrex .  Anti-infectives (From admission, onward)    Start     Dose/Rate Route Frequency Ordered Stop   11/11/23 1600  valACYclovir  (VALTREX ) tablet 1,000 mg        1,000 mg Per Tube 3 times daily 11/11/23 1308 11/18/23 0959   11/11/23 1200  valACYclovir  (VALTREX ) tablet 1,000 mg  Status:  Discontinued        1,000 mg Oral 3 times daily 11/11/23 1107 11/11/23 1308   11/07/23 2300  vancomycin  (VANCOREADY) IVPB 750 mg/150 mL  Status:  Discontinued        750 mg 150 mL/hr over 60 Minutes Intravenous Every 48 hours 11/05/23 2234 11/07/23 0825   11/07/23 1000  vancomycin  (VANCOREADY) IVPB 1250 mg/250  mL  Status:  Discontinued        1,250 mg 166.7 mL/hr over 90 Minutes Intravenous Every 24 hours 11/07/23 0825 11/07/23 0834   11/07/23 1000  Vancomycin  (VANCOCIN ) 1,250 mg in sodium chloride  0.9 % 250 mL IVPB  Status:  Discontinued        1,250 mg 166.7 mL/hr over 90 Minutes Intravenous Every 24 hours 11/07/23 0834 11/08/23 1433   11/07/23 0915  meropenem  (MERREM ) 1 g in sodium chloride  0.9 % 100 mL IVPB        1 g 200 mL/hr over 30 Minutes Intravenous Every 8 hours 11/07/23 0820 11/09/23 2159   11/05/23 2330  meropenem  (MERREM ) 500 mg in sodium chloride  0.9 % 100 mL IVPB  Status:  Discontinued        500 mg 200 mL/hr over 30 Minutes Intravenous Every 12 hours 11/05/23 2153 11/07/23 0820   11/05/23 2215  vancomycin  (VANCOREADY) IVPB 2000 mg/400 mL        2,000 mg 200 mL/hr over 120 Minutes Intravenous  Once 11/05/23 2210 11/06/23 0045   11/05/23 2045  meropenem  (MERREM ) 1 g in sodium chloride  0.9 % 100 mL IVPB  Status:  Discontinued  1 g 200 mL/hr over 30 Minutes Intravenous  Once 11/05/23 2043 11/05/23 2214       Subjective:  Today, patient was seen and examined at bedside.  Still with mumbling speech indistinct speech and trying to get out of the bed.  No significant changes/improvements noted.  Objective: Vitals:   11/15/23 0804 11/15/23 1120  BP: (!) 113/91 (!) 149/52  Pulse: (!) 112   Resp: 18   Temp: 98.6 F (37 C) 98 F (36.7 C)  SpO2: 99%     Intake/Output Summary (Last 24 hours) at 11/15/2023 1121 Last data filed at 11/15/2023 0720 Gross per 24 hour  Intake --  Output 2050 ml  Net -2050 ml   Filed Weights   11/12/23 0500 11/13/23 0419 11/14/23 0358  Weight: 102.1 kg 97.1 kg 97.5 kg   Body mass index is 33.67 kg/m.   Physical Exam:  GENERAL: Patient is alert awake, , mildly interactive,. Not in obvious distress.  Obese build.  Cortrak tube tube in place.  Appears deconditioned and weak.  Indistinct speech. HENT: No scleral pallor or icterus. Pupils  equally reactive to light. Oral mucosa is moist NECK: is supple, no gross swelling noted. CHEST:   Diminished breath sounds bilaterally. CVS: S1 and S2 heard, no murmur. Regular rate and rhythm.  ABDOMEN: Soft, non-tender, bowel sounds are present. EXTREMITIES: No edema.  Right upper extremity PICC line in place. CNS: Indistinct speech.  Moves extremities.  Generalized weakness noted. SKIN: warm and dry, erythromatous  rash on the back noted on 11/09/2023, sacral ulcer present on admission.    Data Review: I have personally reviewed the following laboratory data and studies,  CBC: Recent Labs  Lab 11/12/23 0240 11/13/23 0452 11/13/23 0611 11/14/23 0420 11/15/23 0412  WBC 5.6 PATIENT IDENTIFICATION ERROR. PLEASE DISREGARD RESULTS. ACCOUNT WILL BE CREDITED. 5.5 4.6 4.5  HGB 8.2* PATIENT IDENTIFICATION ERROR. PLEASE DISREGARD RESULTS. ACCOUNT WILL BE CREDITED. 8.4* 7.4* 7.2*  HCT 24.8* PATIENT IDENTIFICATION ERROR. PLEASE DISREGARD RESULTS. ACCOUNT WILL BE CREDITED. 25.6* 23.5* 22.1*  MCV 102.5* PATIENT IDENTIFICATION ERROR. PLEASE DISREGARD RESULTS. ACCOUNT WILL BE CREDITED. 102.0* 106.8* 106.3*  PLT 35* PATIENT IDENTIFICATION ERROR. PLEASE DISREGARD RESULTS. ACCOUNT WILL BE CREDITED. 36* 36* 35*   Basic Metabolic Panel: Recent Labs  Lab 11/08/23 2112 11/08/23 2112 11/09/23 0345 11/10/23 0447 11/12/23 0240 11/13/23 0615 11/14/23 0420 11/15/23 0412  NA  --    < > 139 137 137 139 137 138  K  --    < > 4.4 4.0 3.9 3.5 3.8 4.2  CL  --    < > 105 101 99 101 101 103  CO2  --    < > 26 29 30 26 28 28   GLUCOSE  --    < > 175* 119* 93 119* 142* 191*  BUN  --    < > 35* 32* 39* 34* 41* 43*  CREATININE  --    < > 0.61 0.56 0.59 0.64 0.64 0.63  CALCIUM   --    < > 8.9 9.0 9.7 9.6 9.4 9.1  MG 1.9  --  2.1 1.6* 1.8 1.6* 1.8 1.6*  PHOS 3.6  --  2.9  --   --  3.8  --   --    < > = values in this interval not displayed.   Liver Function Tests: No results for input(s): "AST", "ALT",  "ALKPHOS", "BILITOT", "PROT", "ALBUMIN" in the last 168 hours.  No results for input(s): "LIPASE", "AMYLASE" in the last  168 hours. No results for input(s): "AMMONIA" in the last 168 hours. Cardiac Enzymes: No results for input(s): "CKTOTAL", "CKMB", "CKMBINDEX", "TROPONINI" in the last 168 hours. BNP (last 3 results) No results for input(s): "BNP" in the last 8760 hours.  ProBNP (last 3 results) No results for input(s): "PROBNP" in the last 8760 hours.  CBG: Recent Labs  Lab 11/14/23 1559 11/14/23 1935 11/15/23 0025 11/15/23 0344 11/15/23 0815  GLUCAP 114* 217* 172* 187* 122*   Recent Results (from the past 240 hours)  Blood Culture (routine x 2)     Status: None   Collection Time: 11/05/23  4:33 PM   Specimen: BLOOD LEFT HAND  Result Value Ref Range Status   Specimen Description BLOOD LEFT HAND  Final   Special Requests   Final    BOTTLES DRAWN AEROBIC AND ANAEROBIC Blood Culture results may not be optimal due to an inadequate volume of blood received in culture bottles   Culture   Final    NO GROWTH 5 DAYS Performed at [facility], [address], [place], [place] [address]    Report Status 11/10/2023 FINAL  Final  Blood Culture (routine x 2)     Status: None   Collection Time: 11/05/23  5:09 PM   Specimen: BLOOD  Result Value Ref Range Status   Specimen Description BLOOD SITE NOT SPECIFIED  Final   Special Requests   Final    BOTTLES DRAWN AEROBIC ONLY Blood Culture results may not be optimal due to an inadequate volume of blood received in culture bottles   Culture   Final    NO GROWTH 5 DAYS Performed at [facility], [address] [place], [place] [address]    Report Status 11/10/2023 FINAL  Final  Resp panel by RT-PCR (RSV, Flu A&B, Covid) Anterior Nasal Swab     Status: None   Collection Time: 11/05/23  6:25 PM   Specimen: Anterior Nasal Swab  Result Value Ref Range Status   SARS Coronavirus 2 by RT PCR NEGATIVE NEGATIVE Final   Influenza A  by PCR NEGATIVE NEGATIVE Final   Influenza B by PCR NEGATIVE NEGATIVE Final    Comment: (NOTE) The Xpert Xpress SARS-CoV-2/FLU/RSV plus assay is intended as an aid in the diagnosis of influenza from Nasopharyngeal swab specimens and should not be used as a sole basis for treatment. Nasal washings and aspirates are unacceptable for Xpert Xpress SARS-CoV-2/FLU/RSV testing.  Fact Sheet for Patients: BloggerCourse.com  Fact Sheet for Healthcare Providers: SeriousBroker.it  This test is not yet approved or cleared by the [place]  FDA and has been authorized for detection and/or diagnosis of SARS-CoV-2 by FDA under an Emergency Use Authorization (EUA). This EUA will remain in effect (meaning this test can be used) for the duration of the COVID-19 declaration under Section 564(b)(1) of the Act, 21 U.S.C. section 360bbb-3(b)(1), unless the authorization is terminated or revoked.     Resp Syncytial Virus by PCR NEGATIVE NEGATIVE Final    Comment: (NOTE) Fact Sheet for Patients: BloggerCourse.com  Fact Sheet for Healthcare Providers: SeriousBroker.it  This test is not yet approved or cleared by the [place]  FDA and has been authorized for detection and/or diagnosis of SARS-CoV-2 by FDA under an Emergency Use Authorization (EUA). This EUA will remain in effect (meaning this test can be used) for the duration of the COVID-19 declaration under Section 564(b)(1) of the Act, 21 U.S.C. section 360bbb-3(b)(1), unless the authorization is terminated or revoked.  Performed at  [facility], [address], [place], [place] [address]   MRSA Next Gen by PCR, Nasal     Status: None   Collection Time: 11/06/23  3:47 AM   Specimen: Nasal Mucosa; Nasal Swab  Result Value Ref Range Status   MRSA by PCR Next Gen NOT DETECTED NOT DETECTED Final    Comment: (NOTE) The GeneXpert MRSA  Assay (FDA approved for NASAL specimens only), is one component of a comprehensive MRSA colonization surveillance program. It is not intended to diagnose MRSA infection nor to guide or monitor treatment for MRSA infections. Test performance is not FDA approved in patients less than 2 years old. Performed at [facility], [address] [place], [place] [address]   Urine Culture (for pregnant, neutropenic or urologic patients or patients with an indwelling urinary catheter)     Status: None   Collection Time: 11/07/23  9:18 AM   Specimen: Urine, Catheterized  Result Value Ref Range Status   Specimen Description URINE, CATHETERIZED  Final   Special Requests NONE  Final   Culture   Final    NO GROWTH Performed at [facility], [address] [place], [place] [address]    Report Status 11/08/2023 FINAL  Final     Studies: No results found.     Gabbie Marzo, MD  [facility] 11/15/2023  If 7PM-7AM, please contact night-coverage

## 2023-11-16 DIAGNOSIS — Z66 Do not resuscitate: Secondary | ICD-10-CM | POA: Diagnosis not present

## 2023-11-16 DIAGNOSIS — Z515 Encounter for palliative care: Secondary | ICD-10-CM | POA: Diagnosis not present

## 2023-11-16 DIAGNOSIS — A419 Sepsis, unspecified organism: Secondary | ICD-10-CM | POA: Diagnosis not present

## 2023-11-16 DIAGNOSIS — Z7189 Other specified counseling: Secondary | ICD-10-CM | POA: Diagnosis not present

## 2023-11-16 LAB — GLUCOSE, CAPILLARY
Glucose-Capillary: 144 mg/dL — ABNORMAL HIGH (ref 70–99)
Glucose-Capillary: 145 mg/dL — ABNORMAL HIGH (ref 70–99)
Glucose-Capillary: 163 mg/dL — ABNORMAL HIGH (ref 70–99)
Glucose-Capillary: 188 mg/dL — ABNORMAL HIGH (ref 70–99)
Glucose-Capillary: 193 mg/dL — ABNORMAL HIGH (ref 70–99)
Glucose-Capillary: 82 mg/dL (ref 70–99)

## 2023-11-16 LAB — BASIC METABOLIC PANEL WITH GFR
Anion gap: 7 (ref 5–15)
BUN: 34 mg/dL — ABNORMAL HIGH (ref 8–23)
CO2: 26 mmol/L (ref 22–32)
Calcium: 8.3 mg/dL — ABNORMAL LOW (ref 8.9–10.3)
Chloride: 110 mmol/L (ref 98–111)
Creatinine, Ser: 0.45 mg/dL (ref 0.44–1.00)
GFR, Estimated: >60 mL/min (ref >60)
Glucose, Bld: 117 mg/dL — ABNORMAL HIGH (ref 70–99)
Potassium: 3.2 mmol/L — ABNORMAL LOW (ref 3.5–5.1)
Sodium: 143 mmol/L (ref 135–145)

## 2023-11-16 LAB — MAGNESIUM: Magnesium: 1.5 mg/dL — ABNORMAL LOW (ref 1.7–2.4)

## 2023-11-16 MED ORDER — LORAZEPAM 2 MG/ML IJ SOLN
1.0000 mg | INTRAMUSCULAR | Status: DC | PRN
Start: 2023-11-16 — End: 2023-11-22
  Administered 2023-11-16 – 2023-11-19 (×4): 1 mg via INTRAVENOUS
  Filled 2023-11-16 (×4): qty 1

## 2023-11-16 MED ORDER — POTASSIUM CHLORIDE CRYS ER 20 MEQ PO TBCR
40.0000 meq | EXTENDED_RELEASE_TABLET | Freq: Once | ORAL | Status: AC
  Administered 2023-11-16: 40 meq via ORAL
  Filled 2023-11-16: qty 2

## 2023-11-16 MED ORDER — MAGNESIUM SULFATE 2 GM/50ML IV SOLN
2.0000 g | Freq: Once | INTRAVENOUS | Status: AC
Start: 2023-11-16 — End: 2023-11-16
  Administered 2023-11-16: 2 g via INTRAVENOUS
  Filled 2023-11-16: qty 50

## 2023-11-16 NOTE — Progress Notes (Signed)
 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  Patient was recently discharged from rehab facility on 4/14.  Patient was not taking her medications and food at home and had been bedbound.  According to husband, who was at the beside, she has been refusing her food and meds for several weeks.  In the ED, patient was hypotensive and required vasopressors.  Lactic acid was 2.2 initially.  Creatinine was 3.2 from baseline 0.7. At this time, patient has been transferred out of the ICU.    Assessment/Plan: Principal Problem:   Sepsis (HCC) Active Problems:   Protein-calorie malnutrition, severe  Sepsis with septic shock secondary to UTI. Resolved.  Patient initially received vasopressors.  Foley catheter was changed.  Initially received vancomycin  and meropenem  due to history of ESBL.  Subsequently was on meropenem  and has completed the course.  Urine culture with no growth.  Blood cultures negative in > 5 days.  Afebrile at this time with a temperature max of 98.5. F.  Latest blood pressure at 109/96, on midodrine  as well.  Have decreased the dose of midodrine  to 5 mg 3 times daily for blood pressure.  Latest blood pressure 137/64   Acute metabolic encephalopathy H/o Seizures  History of glioblastoma s/p resection. Continue AEDs with Vimpat  and Keppra ..  Continue dexamethasone .  mildly Communicative with difficult understand speech.  no improvement in mentation.  Still on mittens.  AKI on CKD Improved at this time.  Latest creatinine of 0.6.  Acute respiratory failure Currently on room air.  Continue to monitor closely.  On room air.   Sacral decub, left  unstageable present on admission, right buttocks stage II ulceration present on admission Pressure Injury 10/20/23 Buttocks Right Stage 2 -  Partial thickness loss of dermis presenting as a shallow open injury with a red, pink wound bed without slough. the area is pink in the right buttocks (Active)  10/20/23 1500  Location: Buttocks  Location Orientation: Right  Staging: Stage 2 -  Partial thickness loss of dermis presenting as a shallow open injury with a red, pink wound bed without slough.  Wound Description (Comments): the area is pink in the right buttocks  Present on Admission: Yes     Pressure Injury 11/06/23 Buttocks Left Unstageable - Full thickness tissue loss in which the base of the injury is covered by slough (yellow, tan, gray, green or brown) and/or eschar (tan, brown or black) in the wound bed. Full tissue loss in which base  (Active)  11/06/23 0430  Location: Buttocks  Location Orientation: Left  Staging: Unstageable - Full thickness tissue loss in which the base of the injury is covered by slough (yellow, tan, gray, green or brown) and/or eschar (tan, brown or black) in the wound bed.  Wound Description (Comments): Full tissue loss in which base of injury covered by slough  Present on Admission: Yes   Wound care recommending surgical intervention and possible debridement in a nonurgent basis.  Severe Calorie malnutrition, present on admission Body mass index is 31.48 kg/m.  Refeeding syndrome Nutrition Status: Nutrition Problem: Severe Malnutrition Etiology: acute illness (recurrent hospitalizations and rehab stays) Signs/Symptoms: severe muscle depletion, moderate fat depletion, percent weight loss (10.5%  x 3 months) Percent weight loss: 10.5 % Interventions: Juven, MVI, Prostat, Tube feeding  On cortrak tube and tube feeding.    Hypokalemia.  Potassium of 3.2.  Will replace and check BMP in AM.  Hypomagnesemia.  Magnesium  of 1.5.  Continue magnesium  sulfate  replacement.   Anemia of chronic disease Thrombocytopenia Received 1 unit of packed RBC.  Continue to monitor closely.  Significant thrombocytopenia with platelet count of 36..  Latest hemoglobin of 7.2   Left toe swelling Secondary to chronic gout.  Continue to monitor.  Hypothyroidism.  Continue Synthroid   Class I obesity.Body mass index is 31.48 kg/m.  Nutrition on board.  On tube feeding at this time.   Concern for Herpes Zoster continue Valtrex  1000 g 3 times daily for 7 days.  On airborne isolation precautions.  Goals of care I again had a prolonged discussion with the patient's daughter at bedside on 11/14/2023, palliative care following.  Family to decide about further goals of care as clinical course progresses.   DVT prophylaxis: SCDs Start: 11/05/23 2151  Disposition: .  Goals of care discussion underway.  Likely to skilled nursing facility if improved   Status is: Inpatient Remains inpatient appropriate because: Pending clinical improvement, cortrak tube tube feeding, ongoing encephalopathy    Code Status:     Code Status: Limited: Do not attempt resuscitation (DNR) -DNR-LIMITED -Do Not Intubate/DNI   Family Communication:  Spoke with the patient's daughter at bedside 11/14/2023  Consultants: Palliative care PCCM General Surgery  Procedures: Cortrak tube tube feeding. EEG  Anti-infectives:   Valtrex .  Anti-infectives (From admission, onward)    Start     Dose/Rate Route Frequency Ordered Stop   11/11/23 1600  valACYclovir  (VALTREX ) tablet 1,000 mg        1,000 mg Per Tube 3 times daily 11/11/23 1308 11/18/23 0959   11/11/23 1200  valACYclovir  (VALTREX ) tablet 1,000 mg  Status:  Discontinued        1,000 mg Oral 3 times daily 11/11/23 1107 11/11/23 1308   11/07/23 2300  vancomycin  (VANCOREADY) IVPB 750 mg/150 mL  Status:  Discontinued        750 mg 150 mL/hr over 60 Minutes Intravenous Every 48 hours 11/05/23 2234 11/07/23 0825   11/07/23 1000   vancomycin  (VANCOREADY) IVPB 1250 mg/250 mL  Status:  Discontinued        1,250 mg 166.7 mL/hr over 90 Minutes Intravenous Every 24 hours 11/07/23 0825 11/07/23 0834   11/07/23 1000  Vancomycin  (VANCOCIN ) 1,250 mg in sodium chloride  0.9 % 250 mL IVPB  Status:  Discontinued        1,250 mg 166.7 mL/hr over 90 Minutes Intravenous Every 24 hours 11/07/23 0834 11/08/23 1433   11/07/23 0915  meropenem  (MERREM ) 1 g in sodium chloride  0.9 % 100 mL IVPB        1 g 200 mL/hr over 30 Minutes Intravenous Every 8 hours 11/07/23 0820 11/09/23 2159   11/05/23 2330  meropenem  (MERREM ) 500 mg in sodium chloride  0.9 % 100 mL IVPB  Status:  Discontinued        500 mg 200 mL/hr over 30 Minutes Intravenous Every 12 hours 11/05/23 2153 11/07/23 0820   11/05/23 2215  vancomycin  (VANCOREADY) IVPB 2000 mg/400 mL        2,000 mg 200 mL/hr over 120 Minutes Intravenous  Once 11/05/23 2210 11/06/23 0045   11/05/23 2045  meropenem  (MERREM ) 1 g in sodium chloride  0.9 % 100 mL IVPB  Status:  Discontinued  1 g 200 mL/hr over 30 Minutes Intravenous  Once 11/05/23 2043 11/05/23 2214       Subjective:  Today, patient was seen and examined at bedside.  No improvement in mental capacity.  Has bilateral mittens in place.  Mumbling and indistinct speech.   Vitals:   11/16/23 0420 11/16/23 1149  BP: 111/61 137/64  Pulse: 92 100  Resp: 15 15  Temp: 97.7 F (36.5 C) 98.6 F (37 C)  SpO2: 100% 100%    Intake/Output Summary (Last 24 hours) at 11/16/2023 1515 Last data filed at 11/16/2023 1200 Gross per 24 hour  Intake 5842.43 ml  Output 1527 ml  Net 4315.43 ml   Filed Weights   11/13/23 0419 11/14/23 0358 11/16/23 0420  Weight: 97.1 kg 97.5 kg 91.2 kg   Body mass index is 31.48 kg/m.   Physical Exam:  GENERAL: Patient is alert awake, , mildly interactive,. Not in obvious distress.  Obese build.  Cortrak tube tube in place.  Appears deconditioned and weak.  Indistinct speech. HENT: No scleral pallor or  icterus. Pupils equally reactive to light. Oral mucosa is moist NECK: is supple, no gross swelling noted. CHEST:   Diminished breath sounds bilaterally. CVS: S1 and S2 heard, no murmur. Regular rate and rhythm.  ABDOMEN: Soft, non-tender, bowel sounds are present.  Foley catheter in place. EXTREMITIES: No edema.  Right upper extremity PICC line in place. CNS: Indistinct speech.  Moves extremities.  Generalized weakness noted. SKIN: warm and dry, erythromatous  rash on the back, sacral ulcer present on admission.   Data Review: I have personally reviewed the following laboratory data and studies,  CBC: Recent Labs  Lab 11/12/23 0240 11/13/23 0452 11/13/23 0611 11/14/23 0420 11/15/23 0412  WBC 5.6 PATIENT IDENTIFICATION ERROR. PLEASE DISREGARD RESULTS. ACCOUNT WILL BE CREDITED. 5.5 4.6 4.5  HGB 8.2* PATIENT IDENTIFICATION ERROR. PLEASE DISREGARD RESULTS. ACCOUNT WILL BE CREDITED. 8.4* 7.4* 7.2*  HCT 24.8* PATIENT IDENTIFICATION ERROR. PLEASE DISREGARD RESULTS. ACCOUNT WILL BE CREDITED. 25.6* 23.5* 22.1*  MCV 102.5* PATIENT IDENTIFICATION ERROR. PLEASE DISREGARD RESULTS. ACCOUNT WILL BE CREDITED. 102.0* 106.8* 106.3*  PLT 35* PATIENT IDENTIFICATION ERROR. PLEASE DISREGARD RESULTS. ACCOUNT WILL BE CREDITED. 36* 36* 35*   Basic Metabolic Panel: Recent Labs  Lab 11/12/23 0240 11/13/23 0615 11/14/23 0420 11/15/23 0412 11/16/23 0830  NA 137 139 137 138 143  K 3.9 3.5 3.8 4.2 3.2*  CL 99 101 101 103 110  CO2 30 26 28 28 26   GLUCOSE 93 119* 142* 191* 117*  BUN 39* 34* 41* 43* 34*  CREATININE 0.59 0.64 0.64 0.63 0.45  CALCIUM  9.7 9.6 9.4 9.1 8.3*  MG 1.8 1.6* 1.8 1.6* 1.5*  PHOS  --  3.8  --   --   --    Liver Function Tests: No results for input(s): "AST", "ALT", "ALKPHOS", "BILITOT", "PROT", "ALBUMIN" in the last 168 hours.  No results for input(s): "LIPASE", "AMYLASE" in the last 168 hours. No results for input(s): "AMMONIA" in the last 168 hours. Cardiac Enzymes: No  results for input(s): "CKTOTAL", "CKMB", "CKMBINDEX", "TROPONINI" in the last 168 hours. BNP (last 3 results) No results for input(s): "BNP" in the last 8760 hours.  ProBNP (last 3 results) No results for input(s): "PROBNP" in the last 8760 hours.  CBG: Recent Labs  Lab 11/15/23 1932 11/15/23 2335 11/16/23 0416 11/16/23 0831 11/16/23 1145  GLUCAP 184* 202* 188* 82 144*   Recent Results (from the past 240 hours)  Urine Culture (for pregnant, neutropenic or  urologic patients or patients with an indwelling urinary catheter)     Status: None   Collection Time: 11/07/23  9:18 AM   Specimen: Urine, Catheterized  Result Value Ref Range Status   Specimen Description URINE, CATHETERIZED  Final   Special Requests NONE  Final   Culture   Final    NO GROWTH Performed at [facility], [address] [place], [place] [address]    Report Status 11/08/2023 FINAL  Final     Studies: No results found.     Analiah Drum, MD  [facility] 11/16/2023  If 7PM-7AM, please contact night-coverage

## 2023-11-16 NOTE — Progress Notes (Signed)
 Nutrition Follow-up  DOCUMENTATION CODES:  Severe malnutrition in context of acute illness/injury  INTERVENTION:  Continue tube feeding via cortrak tube: Osmolite 1.5 at 83 ml/h (996 ml per day) x12 hours (6PM-6AM) Prosource TF20 60 ml 2x/d   Provides 1680 kcal (93% of minimum estimated needs), 102 gm protein (107% of minimum estimated needs), 762 ml free water daily   Continue MVI with minerals Continue 1 packet Juven BID, each packet provides 95 calories, 2.5 grams of protein (collagen), + micronutrients to support wound healing Monitor speech notes for diet advancement/tolerance Continue Ensure Enlive po TID, each supplement provides 350 kcal and 20 grams of protein.  Continue Magic cup TID with meals, each supplement provides 290 kcal and 9 grams of protein   NUTRITION DIAGNOSIS:  Severe Malnutrition related to acute illness (recurrent hospitalizations and rehab stays) as evidenced by severe muscle depletion, moderate fat depletion, percent weight loss (10.5% x 3 months). - remains applicable  GOAL:  Patient will meet greater than or equal to 90% of their needs - being met via tube feeding  MONITOR:  Diet advancement, Labs, Weight trends, TF tolerance, Skin  REASON FOR ASSESSMENT:  Consult Wound healing  ASSESSMENT:   Pt with hx of glioblastoma s/p resection 05/2023 and radiation therapy, HTN, HLD, DM type 2, and GERD presented to ED from SNF with sepsis.  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 r/t fall from bed at rehab facility (septic r/t UTI) 4/14 discharged to [facility] Current Admission 4/24 admitted 4/25 Cortrak placed, TF initiated 4/26 off pressors 4/27 txr out of ICU 4/28 rapid response: decreased responsiveness 4/28 EEG: mild to moderate diffuse encephalopathy 4/30 advanced to full liquid diet, calorie  count initiated, GOC discussion  Mentation has not improved, nor has patient's intake. Continues to mumble with indistinct speech. Family still in the process of determining GOC. Nutrition support continues. Skin integrity issues continue.  Average Meal Intake No meal intake documented to review  Patient intake remains negligible, per RN. This is primarily related to her mentation. Remains on full liquid diet. Tolerating nocturnal tube feeding regimen. Bowels stable. No reported N/V.  Admit Weight: 99.4kg Current Weight: 91.2kg  Questionable weight accuracy. Pt has shown 14lbs wt loss in two days. Will re-assess trend with next weight collection. No significant edema noted.   Intake/Output Summary (Last 24 hours) at 11/16/2023 1105 Last data filed at 11/16/2023 0852 Gross per 24 hour  Intake 3991.43 ml  Output 1877 ml  Net 2114.43 ml    Net IO Since Admission: -869.67 mL [11/16/23 1105]   Drains/Lines: Cortrak (65cm) PICC (placed 4/23) Foley catheter (chronic) UOP: 2L x24 hours  Still requiring repletion. Mg sulfate ordered yesterday. Corrected calcium  elevated and likely r/t immobility. Midodrine  reduced.   Meds: famotidine , SSI 0-15 q4, levothyroxine , MVI, valacyclovir   Drips: 2g Mg sulfate x1  Labs reviewed from 5/4:  Na+ 138 (wdl) K+ 4.2 (wdl) Mg 1.8>1.6 (L) Corr Ca 10.5 (H) TSH 7.767>23.158 (H) Lactic Acid 2.2>1.2 (wdl) CBGs 142-191 x24 hours A1c 6.7 (07/2023)  Diet Order:   Diet Order             Diet full liquid Room service appropriate? Yes with Assist; Fluid consistency: Thin  Diet effective now            EDUCATION NEEDS:  Education needs have been addressed  Skin:  Skin Assessment: Skin Integrity Issues:  Skin Integrity Issues:: Stage II Stage II: R buttocks Unstageable: buttocks  Last BM:  5/5 - type 4 x1  Height:  Ht Readings from Last 1 Encounters:  11/05/23 5\' 7"  (1.702 m)   Weight:  Wt Readings from Last 1 Encounters:  11/16/23 91.2 kg    Ideal Body Weight:  61.4 kg  BMI:  Body mass index is 31.48 kg/m.  Estimated Nutritional Needs:   Kcal:  1800-2000 kcal/d  Protein:  95-110 g/d  Fluid:  2L/d  Con Decant MS, RD, LDN Registered Dietitian Clinical Nutrition RD Inpatient Contact Info in Amion

## 2023-11-16 NOTE — Progress Notes (Signed)
 Speech Language Pathology Treatment: Dysphagia  Patient Details Name: Dana Morton MRN: [id number] DOB: 1950/11/02 Today's Date: 11/16/2023 Time: 9528-4132 SLP Time Calculation (min) (ACUTE ONLY): 45 min  Assessment / Plan / Recommendation Clinical Impression  Returned to work with Mrs. Dana Morton.  Her husband, Dana Morton, was at the bedside.  She was calmer, less restless today and able to engage in some communication with Mr. Dana Morton. Despite answering "yes" to suggestions she drink some Ensure, she is unable to follow-through. She was unable to drink through a straw today; accepted small amounts dropped from straw into her mouth, but not enough to sustain her needs. Spoke with Dana Morton, who is hopeful that she will improve and verbalized frustration that the medical team is not willing to accept an alternative to comfort care and hospice. Offered space to talk and acknowledged his frustrations.   SLP will follow up this week. D/W RN. Continue full liquids.     HPI HPI: Pt is a 73 y/o female who was admitted 4/24 secondary to failure to thrive;  noted to not want to eat or drink or take her meds. Cortrak placed 4/25. CT head negative. EEG 4/27: no seizures; cortical dysfunction from left temporal region, likely secondary to underlying structural abnormality; mild to moderate diffuse encephalopathy. Pt recently admitted to [facility] 4/8 for fall from bed at the rehab/nursing facility and discharged on 4/14. 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 BSE 08/07/23: cognitively based dysphagia; a dysphagia 3 diet started at that time with consideration of pt's mentation and per family's preference.      SLP Plan  Continue with current plan of care      Recommendations for follow up therapy are one component of a multi-disciplinary discharge planning process, led by  the attending physician.  Recommendations may be updated based on patient status, additional functional criteria and insurance authorization.    Recommendations  Diet recommendations: Other(comment) (full liquids) Liquids provided via: Teaspoon;Cup;Straw Medication Administration: Whole meds with puree Supervision: Trained caregiver to feed patient Compensations: Minimize environmental distractions                  Oral care QID   Frequent or constant Supervision/Assistance Dysphagia, oropharyngeal phase (R13.12)     Continue with current plan of care    Dana Morton L. Dana Lincoln, MA CCC/SLP Clinical Specialist - Acute Care SLP Acute Rehabilitation Services Office number [phone]  Dana Morton Dana Morton  11/16/2023, 4:31 PM

## 2023-11-16 NOTE — Plan of Care (Signed)
  Problem: Education: Goal: Ability to describe self-care measures that may prevent or decrease complications (Diabetes Survival Skills Education) will improve Outcome: Progressing Goal: Individualized Educational Video(s) Outcome: Progressing   Problem: Coping: Goal: Ability to adjust to condition or change in health will improve Outcome: Progressing   Problem: Fluid Volume: Goal: Ability to maintain a balanced intake and output will improve Outcome: Progressing   Problem: Health Behavior/Discharge Planning: Goal: Ability to identify and utilize available resources and services will improve Outcome: Progressing Goal: Ability to manage health-related needs will improve Outcome: Progressing   Problem: Metabolic: Goal: Ability to maintain appropriate glucose levels will improve Outcome: Progressing   Problem: Nutritional: Goal: Maintenance of adequate nutrition will improve Outcome: Progressing Goal: Progress toward achieving an optimal weight will improve Outcome: Progressing   Problem: Skin Integrity: Goal: Risk for impaired skin integrity will decrease Outcome: Progressing   Problem: Tissue Perfusion: Goal: Adequacy of tissue perfusion will improve Outcome: Progressing   Problem: Education: Goal: Knowledge of General Education information will improve Description: Including pain rating scale, medication(s)/side effects and non-pharmacologic comfort measures Outcome: Progressing   Problem: Clinical Measurements: Goal: Ability to maintain clinical measurements within normal limits will improve Outcome: Progressing Goal: Will remain free from infection Outcome: Progressing Goal: Diagnostic test results will improve Outcome: Progressing Goal: Respiratory complications will improve Outcome: Progressing Goal: Cardiovascular complication will be avoided Outcome: Progressing   Problem: Nutrition: Goal: Adequate nutrition will be maintained Outcome: Progressing    Problem: Coping: Goal: Level of anxiety will decrease Outcome: Progressing   Problem: Elimination: Goal: Will not experience complications related to bowel motility Outcome: Progressing Goal: Will not experience complications related to urinary retention Outcome: Progressing   Problem: Pain Managment: Goal: General experience of comfort will improve and/or be controlled Outcome: Progressing   Problem: Safety: Goal: Ability to remain free from injury will improve Outcome: Progressing   Problem: Skin Integrity: Goal: Risk for impaired skin integrity will decrease Outcome: Progressing

## 2023-11-16 NOTE — TOC Progression Note (Signed)
 Transition of Care [facility]) - Progression Note    Patient Details  Name: Dana Morton MRN: [id number] Date of Birth: October 11, 1950  [facility]) CM/SW Contact  Juliane Och, LCSW Phone Number: 11/16/2023, 9:48 AM  Clinical Narrative:     9:48 AM Per chart review, patient's spouse has yet decided on whether or not to pursue comfort care on behalf of the patient.   Expected Discharge Plan: Skilled Nursing Facility Barriers to Discharge: Continued Medical Work up  Expected Discharge Plan and Services In-house Referral: Clinical Social Work     Living arrangements for the past 2 months: Skilled Holiday representative, Single Family Home                                       Social Determinants of Health (SDOH) Interventions SDOH Screenings   Food Insecurity: No Food Insecurity (11/07/2023)  Housing: Low Risk  (11/07/2023)  Transportation Needs: Patient Unable To Answer (11/07/2023)  Utilities: Patient Unable To Answer (11/07/2023)  Financial Resource Strain: Low Risk  (10/03/2023)   Received from [facility]  Social Connections: Unknown (11/09/2023)  Tobacco Use: Low Risk  (11/05/2023)    Readmission Risk Interventions     No data to display

## 2023-11-16 NOTE — Plan of Care (Signed)
  Problem: Coping: Goal: Level of anxiety will decrease Outcome: Progressing   Problem: Elimination: Goal: Will not experience complications related to bowel motility Outcome: Progressing Goal: Will not experience complications related to urinary retention Outcome: Progressing   Problem: Pain Managment: Goal: General experience of comfort will improve and/or be controlled Outcome: Progressing

## 2023-11-16 NOTE — Progress Notes (Signed)
 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 She was d/c to facility 4/14 from [facility] after being treated for sepsis, UTI and she had a stage III sacral decubiti. She has a chronic Foley.    Palliative care has been asked to support additional goals of care conversations.   Today's Discussion 11/16/2023  *Please note that this is a verbal dictation therefore any spelling or grammatical errors are due to the "Dragon Medical One" system interpretation.  Chart reviewed inclusive of vital signs, progress notes, laboratory results, and diagnostic images. Patient assessed at the bedside.  She was resting comfortably, easily aroused and redirected.  Assisted with repositioning her gown.  No family was present and I later had a phone conversation with patient's husband.  Created space and opportunity for family's thoughts and feelings on patient's current illness.  Called patient's husband and scheduled a meeting for this afternoon at 1:30pm.  I assessed patient at the bedside.  She is alert and attempts to engage in conversation, occasionally trying to get out of bed but easily redirectable.  We removed her soft restraints while present with her at the bedside.  Reviewed current interventions in place, particularly midodrine  and electrolyte replenishment.  We discussed that there are no other treatable conditions currently identified and that it is worrisome and very likely patient may not recover further than this current state.  We attempted to get her to drink Ensure and after several refusals, she did attempt however was unable to follow through.  We attempted to solicit patient's goals and  priorities at this time.  She is very excited by the prospect of removing her core track feeding tube and letting her eat and drink what she wants, excepting aspiration risk and mostly for comfort feeding.  [name] is very concerned that this will not be enough for her to survive, however.  He feels that the medical team is pushing them towards hospice and while this is not entirely off the table, they want to be diligent in exploring other alternatives and possibilities for recovery.  Husband feels it would be helpful if a physician could go over each medication change that has occurred throughout her several hospitalizations and analyze what medications either need to be added or shouldn't be on.  He was able to FaceTime patient's daughter Trevor Fudge during my visit.  We reviewed care plan and that there are no current indications for any infections other than what has already been treated and surgical team recommendations for conservative management of her sacral wound.  I also provided her with update on the above statements from patient regarding her feeding tube and preference for comfort feeds.  She desires to honor patient's wishes, though wants to continue efforts at conversations to make sure that patient understands what she is saying as much as she can.  She agrees with idea to more thoroughly review medications and possibilities for other treatable causes of altered mental status.  After my visit, I spoke separately with patient's daughter Trevor Fudge on the phone.  She desired a candid review of patient's prognosis and whether she is still appropriate for hospice.  I shared that this is most appropriate, especially considering that patient would not desire to be in  her current state for a prolonged time and she is no longer improving with any therapies.  She made this clear during my first visit with her and Trevor Fudge agrees this is how she feels.  Emotional support and therapeutic listening was  provided.  Questions and concerns addressed/Palliative Support Provided.   Objective Assessment: Vital Signs Vitals:   11/15/23 2329 11/16/23 0420  BP: 130/76 111/61  Pulse:  92  Resp:  15  Temp: 97.7 F (36.5 C) 97.7 F (36.5 C)  SpO2:  100%    Gen:  Elderly Caucasian F chronically ill appearing, restless HEENT: Cortrak in place CV: Regular rate and rhythm  PULM:  On RA, breathing is even and nonlabored ABD: soft/nontender/nondistended  EXT: (+) edema  Neuro: Sleeping  SUMMARY OF RECOMMENDATIONS   -DNAR/DNI  -Continue current care plan  -Family is not ready to make a decision on SNF vs hospice, anticipate decision by mid-week. Fluctuating feelings now that she seems more calm and redirectable -Ongoing goals of care discussions -Psychosocial and emotional support provided -PMT will continue to follow and support   Total time: 90 minutes   Kimothy Kishimoto, PA-C [place] [facility] Team Team Cell Phone: [phone] Please utilize secure chat with additional questions, if there is no response within 30 minutes please call the above phone number  Palliative Medicine Team providers are available by phone from 7am to 7pm daily and can be reached through the team cell phone.  Should this patient require assistance outside of these hours, please call the patient's attending physician.

## 2023-11-17 DIAGNOSIS — Z515 Encounter for palliative care: Secondary | ICD-10-CM | POA: Diagnosis not present

## 2023-11-17 DIAGNOSIS — R4182 Altered mental status, unspecified: Secondary | ICD-10-CM | POA: Diagnosis not present

## 2023-11-17 DIAGNOSIS — E43 Unspecified severe protein-calorie malnutrition: Secondary | ICD-10-CM | POA: Diagnosis not present

## 2023-11-17 DIAGNOSIS — Z7189 Other specified counseling: Secondary | ICD-10-CM | POA: Diagnosis not present

## 2023-11-17 DIAGNOSIS — A419 Sepsis, unspecified organism: Secondary | ICD-10-CM | POA: Diagnosis not present

## 2023-11-17 LAB — BASIC METABOLIC PANEL WITH GFR
Anion gap: 9 (ref 5–15)
BUN: 37 mg/dL — ABNORMAL HIGH (ref 8–23)
CO2: 28 mmol/L (ref 22–32)
Calcium: 9.3 mg/dL (ref 8.9–10.3)
Chloride: 102 mmol/L (ref 98–111)
Creatinine, Ser: 0.52 mg/dL (ref 0.44–1.00)
GFR, Estimated: >60 mL/min (ref >60)
Glucose, Bld: 119 mg/dL — ABNORMAL HIGH (ref 70–99)
Potassium: 3.7 mmol/L (ref 3.5–5.1)
Sodium: 139 mmol/L (ref 135–145)

## 2023-11-17 LAB — CBC
HCT: 23.8 % — ABNORMAL LOW (ref 36.0–46.0)
Hemoglobin: 7.7 g/dL — ABNORMAL LOW (ref 12.0–15.0)
MCH: 34.4 pg — ABNORMAL HIGH (ref 26.0–34.0)
MCHC: 32.4 g/dL (ref 30.0–36.0)
MCV: 106.3 fL — ABNORMAL HIGH (ref 80.0–100.0)
Platelets: 40 10*3/uL — ABNORMAL LOW (ref 150–400)
RBC: 2.24 MIL/uL — ABNORMAL LOW (ref 3.87–5.11)
RDW: 22.3 % — ABNORMAL HIGH (ref 11.5–15.5)
WBC: 4.9 10*3/uL (ref 4.0–10.5)
nRBC: 2 % — ABNORMAL HIGH (ref 0.0–0.2)

## 2023-11-17 LAB — GLUCOSE, CAPILLARY
Glucose-Capillary: 137 mg/dL — ABNORMAL HIGH (ref 70–99)
Glucose-Capillary: 88 mg/dL (ref 70–99)
Glucose-Capillary: 194 mg/dL — ABNORMAL HIGH (ref 70–99)
Glucose-Capillary: 188 mg/dL — ABNORMAL HIGH (ref 70–99)
Glucose-Capillary: 180 mg/dL — ABNORMAL HIGH (ref 70–99)
Glucose-Capillary: 188 mg/dL — ABNORMAL HIGH (ref 70–99)

## 2023-11-17 LAB — MAGNESIUM: Magnesium: 1.9 mg/dL (ref 1.7–2.4)

## 2023-11-17 MED ORDER — MAGNESIUM SULFATE 2 GM/50ML IV SOLN
2.0000 g | Freq: Once | INTRAVENOUS | Status: AC
  Administered 2023-11-17: 2 g via INTRAVENOUS
  Filled 2023-11-17: qty 50

## 2023-11-17 MED ORDER — POTASSIUM CHLORIDE CRYS ER 20 MEQ PO TBCR
40.0000 meq | EXTENDED_RELEASE_TABLET | Freq: Once | ORAL | Status: AC
  Administered 2023-11-17: 40 meq via ORAL
  Filled 2023-11-17: qty 2

## 2023-11-17 MED ORDER — MIDODRINE HCL 5 MG PO TABS
5.0000 mg | ORAL_TABLET | Freq: Two times a day (BID) | ORAL | Status: DC
  Administered 2023-11-17 – 2023-11-22 (×10): 5 mg
  Filled 2023-11-17 (×10): qty 1

## 2023-11-17 NOTE — Plan of Care (Signed)
  Problem: Skin Integrity: Goal: Risk for impaired skin integrity will decrease Outcome: Progressing   Problem: Coping: Goal: Level of anxiety will decrease Outcome: Progressing   Problem: Elimination: Goal: Will not experience complications related to bowel motility Outcome: Progressing Goal: Will not experience complications related to urinary retention Outcome: Progressing   Problem: Pain Managment: Goal: General experience of comfort will improve and/or be controlled Outcome: Progressing

## 2023-11-17 NOTE — Progress Notes (Signed)
 PROGRESS NOTE  Dana Morton ZOX:[id number] DOB: 1951-06-11 DOA: 11/05/2023 PCP: Melchor Spoon, MD   LOS: 12 days   Brief narrative:  73 y/o female with with past medical history of glioblastoma  s/p resection 05/2023 at [facility], Seizure disorder secondary to tumor/radiation , HTN, Hypothyroidism presented to the hospital skilled nursing facility after sustaining a fall.  Patient was recently discharged from rehab facility on 4/14.  Patient was not taking her medications and food at home and had been bedbound.  According to husband, who was at the beside, she has been refusing her food and meds for several weeks.  In the ED, patient was hypotensive and required vasopressors.  Lactic acid was 2.2 initially.  Creatinine was 3.2 from baseline 0.7.  Patient was subsequently transferred out of the ICU.  At this time patient's mental condition has not improved and remains severely malnourished.  Palliative care on board with ongoing goals of care discussion regarding further course of action.  Assessment/Plan: Principal Problem:   Sepsis (HCC) Active Problems:   Protein-calorie malnutrition, severe  Sepsis with septic shock secondary to UTI. Resolved.  Patient initially received vasopressors.  Foley catheter was changed.  Initially received vancomycin  and meropenem  due to history of ESBL.  Subsequently was on meropenem  and has completed the course.  Urine culture with no growth.  Blood cultures negative in > 5 days.  Afebrile with a temperature max of 100 F.  Latest blood pressure at 126/55, patient on midodrine .  Will decrease the dose to 5 mg twice daily and continue to taper if blood pressure improved.    Acute metabolic encephalopathy H/o Seizures  History of glioblastoma s/p resection. Continue AEDs with Vimpat  and Keppra ..  Continue dexamethasone . Communicative with difficult understand speech, mumbling speech..  no improvement in mentation.  Still on bilateral mittens.  Confused and  trying to get out of the bed.  AKI on CKD Improved at this time.  Latest creatinine of 0.6.  Acute respiratory failure Currently on room air.  Continue to monitor closely.  On room air.   Sacral decub, left unstageable present on admission, right buttocks stage II ulceration present on admission Pressure Injury 10/20/23 Buttocks Right Stage 2 -  Partial thickness loss of dermis presenting as a shallow open injury with a red, pink wound bed without slough. the area is pink in the right buttocks (Active)  10/20/23 1500  Location: Buttocks  Location Orientation: Right  Staging: Stage 2 -  Partial thickness loss of dermis presenting as a shallow open injury with a red, pink wound bed without slough.  Wound Description (Comments): the area is pink in the right buttocks  Present on Admission: Yes     Pressure Injury 11/06/23 Buttocks Left Unstageable - Full thickness tissue loss in which the base of the injury is covered by slough (yellow, tan, gray, green or brown) and/or eschar (tan, brown or black) in the wound bed. Full tissue loss in which base  (Active)  11/06/23 0430  Location: Buttocks  Location Orientation: Left  Staging: Unstageable - Full thickness tissue loss in which the base of the injury is covered by slough (yellow, tan, gray, green or brown) and/or eschar (tan, brown or black) in the wound bed.  Wound Description (Comments): Full tissue loss in which base of injury covered by slough  Present on Admission: Yes   Wound care recommending surgical intervention and possible debridement in a nonurgent basis.  Severe Calorie malnutrition, present on admission Body mass index  is 33.52 kg/m.  Refeeding syndrome Nutrition Status: Nutrition Problem: Severe Malnutrition Etiology: acute illness (recurrent hospitalizations and rehab stays) Signs/Symptoms: severe muscle depletion, moderate fat depletion, percent weight loss (10.5% x 3 months) Percent weight loss: 10.5 % Interventions:  Juven, MVI, Prostat, Tube feeding  On cortrak tube feeding.  On full liquids.  Speech therapy following.  Hypokalemia.  Potassium of 3.7.  Will replace as necessary.  Hypomagnesemia.  Magnesium  of 1.9.  Received magnesium  supplement.   Anemia of chronic disease Thrombocytopenia Received 1 unit of packed RBC.  Continue to monitor closely.  Significant thrombocytopenia with platelet count of 36..  Latest hemoglobin of 7.7   Left toe swelling Secondary to chronic gout.  Continue to monitor.  Hypothyroidism.  Continue Synthroid   Class I obesity.Body mass index is 33.52 kg/m.  Nutrition on board.  On tube feeding at this time.   Concern for Herpes Zoster continue Valtrex  1000 g 3 times daily for 7 days.  On airborne isolation precautions due to wet area of rash..  Goals of care I had a prolonged discussion with the patient's daughter at bedside on 11/14/2023, palliative care following and had conversation with patient's daughter at length on 11/16/2023.  Family not yet ready to make a decision on skilled nursing facility versus hospice..  Family to decide about further goals of care as clinical course progresses.   DVT prophylaxis: SCDs Start: 11/05/23 2151  Disposition: .  Goals of care discussion underway.  Likely to skilled nursing facility if improved   Status is: Inpatient Remains inpatient appropriate because: Pending clinical improvement, palliative care discussion underway, cortrak tube tube feeding, ongoing encephalopathy    Code Status:     Code Status: Limited: Do not attempt resuscitation (DNR) -DNR-LIMITED -Do Not Intubate/DNI   Family Communication:  Spoke with the patient's daughter at bedside 11/14/2023  Consultants: Palliative care PCCM General Surgery  Procedures: Cortrak tube tube feeding. EEG  Anti-infectives:   Valtrex .  Anti-infectives (From admission, onward)    Start     Dose/Rate Route Frequency Ordered Stop   11/11/23 1600  valACYclovir  (VALTREX )  tablet 1,000 mg        1,000 mg Per Tube 3 times daily 11/11/23 1308 11/18/23 0959   11/11/23 1200  valACYclovir  (VALTREX ) tablet 1,000 mg  Status:  Discontinued        1,000 mg Oral 3 times daily 11/11/23 1107 11/11/23 1308   11/07/23 2300  vancomycin  (VANCOREADY) IVPB 750 mg/150 mL  Status:  Discontinued        750 mg 150 mL/hr over 60 Minutes Intravenous Every 48 hours 11/05/23 2234 11/07/23 0825   11/07/23 1000  vancomycin  (VANCOREADY) IVPB 1250 mg/250 mL  Status:  Discontinued        1,250 mg 166.7 mL/hr over 90 Minutes Intravenous Every 24 hours 11/07/23 0825 11/07/23 0834   11/07/23 1000  Vancomycin  (VANCOCIN ) 1,250 mg in sodium chloride  0.9 % 250 mL IVPB  Status:  Discontinued        1,250 mg 166.7 mL/hr over 90 Minutes Intravenous Every 24 hours 11/07/23 0834 11/08/23 1433   11/07/23 0915  meropenem  (MERREM ) 1 g in sodium chloride  0.9 % 100 mL IVPB        1 g 200 mL/hr over 30 Minutes Intravenous Every 8 hours 11/07/23 0820 11/09/23 2159   11/05/23 2330  meropenem  (MERREM ) 500 mg in sodium chloride  0.9 % 100 mL IVPB  Status:  Discontinued        500 mg 200 mL/hr  over 30 Minutes Intravenous Every 12 hours 11/05/23 2153 11/07/23 0820   11/05/23 2215  vancomycin  (VANCOREADY) IVPB 2000 mg/400 mL        2,000 mg 200 mL/hr over 120 Minutes Intravenous  Once 11/05/23 2210 11/06/23 0045   11/05/23 2045  meropenem  (MERREM ) 1 g in sodium chloride  0.9 % 100 mL IVPB  Status:  Discontinued        1 g 200 mL/hr over 30 Minutes Intravenous  Once 11/05/23 2043 11/05/23 2214       Subjective:  Today, patient was seen and examined at bedside.  Patient continues to mumble with mittens and her mental capacity has not improved.    Vitals:   11/17/23 0830 11/17/23 1224  BP: (!) 130/55 (!) 126/55  Pulse: 89 100  Resp: 16 17  Temp: 97.7 F (36.5 C) 100 F (37.8 C)  SpO2: 96% 95%    Intake/Output Summary (Last 24 hours) at 11/17/2023 1313 Last data filed at 11/17/2023 0843 Gross per 24  hour  Intake 1325.43 ml  Output 1250 ml  Net 75.43 ml   Filed Weights   11/14/23 0358 11/16/23 0420 11/17/23 0419  Weight: 97.5 kg 91.2 kg 97.1 kg   Body mass index is 33.52 kg/m.   Physical Exam:  GENERAL: Patient is alert awake, mumbling speech, appears confused,. Not in obvious distress.  Obese build.  Cortrak tube tube in place.  Appears deconditioned and weak.  Indistinct speech. HENT: No scleral pallor or icterus. Pupils equally reactive to light. Oral mucosa is moist NECK: is supple, no gross swelling noted. CHEST:   Diminished breath sounds bilaterally. CVS: S1 and S2 heard, no murmur. Regular rate and rhythm.  ABDOMEN: Soft, non-tender, bowel sounds are present.  Foley catheter in place. EXTREMITIES: No edema.  Right upper extremity PICC line in place. CNS: Indistinct speech.  Moves extremities.  Generalized weakness noted. SKIN: warm and dry, erythromatous  rash on the back, sacral ulcer present on admission.   Data Review: I have personally reviewed the following laboratory data and studies,  CBC: Recent Labs  Lab 11/13/23 0452 11/13/23 0611 11/14/23 0420 11/15/23 0412 11/17/23 0500  WBC PATIENT IDENTIFICATION ERROR. PLEASE DISREGARD RESULTS. ACCOUNT WILL BE CREDITED. 5.5 4.6 4.5 4.9  HGB PATIENT IDENTIFICATION ERROR. PLEASE DISREGARD RESULTS. ACCOUNT WILL BE CREDITED. 8.4* 7.4* 7.2* 7.7*  HCT PATIENT IDENTIFICATION ERROR. PLEASE DISREGARD RESULTS. ACCOUNT WILL BE CREDITED. 25.6* 23.5* 22.1* 23.8*  MCV PATIENT IDENTIFICATION ERROR. PLEASE DISREGARD RESULTS. ACCOUNT WILL BE CREDITED. 102.0* 106.8* 106.3* 106.3*  PLT PATIENT IDENTIFICATION ERROR. PLEASE DISREGARD RESULTS. ACCOUNT WILL BE CREDITED. 36* 36* 35* 40*   Basic Metabolic Panel: Recent Labs  Lab 11/13/23 0615 11/14/23 0420 11/15/23 0412 11/16/23 0830 11/17/23 0500  NA 139 137 138 143 139  K 3.5 3.8 4.2 3.2* 3.7  CL 101 101 103 110 102  CO2 26 28 28 26 28   GLUCOSE 119* 142* 191* 117* 119*  BUN  34* 41* 43* 34* 37*  CREATININE 0.64 0.64 0.63 0.45 0.52  CALCIUM  9.6 9.4 9.1 8.3* 9.3  MG 1.6* 1.8 1.6* 1.5* 1.9  PHOS 3.8  --   --   --   --    Liver Function Tests: No results for input(s): "AST", "ALT", "ALKPHOS", "BILITOT", "PROT", "ALBUMIN" in the last 168 hours.  No results for input(s): "LIPASE", "AMYLASE" in the last 168 hours. No results for input(s): "AMMONIA" in the last 168 hours. Cardiac Enzymes: No results for input(s): "CKTOTAL", "CKMB", "CKMBINDEX", "TROPONINI" in  the last 168 hours. BNP (last 3 results) No results for input(s): "BNP" in the last 8760 hours.  ProBNP (last 3 results) No results for input(s): "PROBNP" in the last 8760 hours.  CBG: Recent Labs  Lab 11/16/23 1926 11/16/23 2353 11/17/23 0416 11/17/23 0827 11/17/23 1222  GLUCAP 193* 145* 137* 88 194*   No results found for this or any previous visit (from the past 240 hours).    Studies: No results found.     Shawnette Augello, MD  [facility] 11/17/2023  If 7PM-7AM, please contact night-coverage

## 2023-11-17 NOTE — Progress Notes (Signed)
 Physical Therapy Treatment Patient Details Name: MAAT BAUGUESS MRN: [id number] DOB: 11-18-1950 Today's Date: 11/17/2023   History of Present Illness 73 y.o. female presents to [facility]/05/23 from SNF after refusing food and meds for weeks. Pt initially with ICU admit for sepsis likely from UTI. Pt also with sacral stage 3 decubitus, AKI on CKD, acute metabolic encephalopathy, and acute respiratory failure. Pt now out of ICU. Multiple recent admissions, with most recent admit 10/20/23 for AMS and fall from bed at SNF. 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    PT Comments  Pt alert but not conversant or following any commands. Pt did transfer to sitting EOB with totalAx1. Pt unable to actively participate in LE exercises while EOB due to cognitive impairment and inability to comprehend task at hand. No family present this date. Aware goals of care vs transition to hospice discussions are attempted to be discussed with daughter and rest of family. Acute PT to follow from a distance as pt continuing to not make any progress towards goals and demo's poor rehab potential and progress.   If plan is discharge home, recommend the following: Two people to help with walking and/or transfers;Two people to help with bathing/dressing/bathroom;Assistance with cooking/housework;Assistance with feeding;Direct supervision/assist for medications management;Direct supervision/assist for financial management;Assist for transportation;Help with stairs or ramp for entrance;Supervision due to cognitive status   Can travel by private vehicle     No  Equipment Recommendations  Hoyer lift;Hospital bed    Recommendations for Other Services       Precautions / Restrictions Precautions Precautions: Fall Recall of  Precautions/Restrictions: Impaired Precaution/Restrictions Comments: sacral wound stage III Restrictions Weight Bearing Restrictions Per Provider Order: No     Mobility  Bed Mobility Overal bed mobility: Needs Assistance     Sidelying to sit: Total assist   Sit to supine: Max assist   General bed mobility comments: pt received on R sidelying, totalA with HOB elevated to sit EOB, pt initiatiated returning to sidelying, maxA for safety and LE management, pt did initiate LE movement back up into bed but ultimately required maxA    Transfers                   General transfer comment: unsafe at this time; recommend maximove transfers    Ambulation/Gait                   Stairs             Wheelchair Mobility     Tilt Bed    Modified Rankin (Stroke Patients Only)       Balance Overall balance assessment: Needs assistance Sitting-balance support: Single extremity supported, Feet unsupported Sitting balance-Leahy Scale: Fair Sitting balance - Comments: once seated pt requiring close contact guard, pt did attempt multiple times to return to R sidelying but over all tolerated 5 min of sitting EOB                                    Communication Communication Communication: Impaired Factors Affecting Communication: Other (comment);Difficulty expressing self (Pt with aphasia at baseline.)  Cognition Arousal: Alert (eyes open but non-verbal) Behavior During Therapy: Flat affect   PT - Cognitive impairments: Orientation, Awareness, Memory, Attention, Initiation, Sequencing, Safety/Judgement,  Problem solving   Orientation impairments: Place, Time, Situation                   PT - Cognition Comments: muffled/garbled speech, unable to understand patient, unable to communicate shaking head yes/no due to inability to follow commands or comprehend Following commands: Impaired Following commands impaired:  (Pt unable to follow 1-step  commands this session.)    Cueing Cueing Techniques: Verbal cues, Tactile cues, Visual cues  Exercises      General Comments General comments (skin integrity, edema, etc.): VSS      Pertinent Vitals/Pain Pain Assessment Pain Assessment: Faces Faces Pain Scale: No hurt Pain Location: generalized with movement Pain Descriptors / Indicators: Grimacing Pain Intervention(s): Monitored during session    Home Living                          Prior Function            PT Goals (current goals can now be found in the care plan section) Acute Rehab PT Goals Patient Stated Goal: unable to state goal PT Goal Formulation: Patient unable to participate in goal setting Time For Goal Achievement: 11/26/23 Potential to Achieve Goals: Fair Progress towards PT goals: Not progressing toward goals - comment    Frequency    Min 1X/week      PT Plan      Co-evaluation              AM-PAC PT "6 Clicks" Mobility   Outcome Measure  Help needed turning from your back to your side while in a flat bed without using bedrails?: Total Help needed moving from lying on your back to sitting on the side of a flat bed without using bedrails?: Total Help needed moving to and from a bed to a chair (including a wheelchair)?: Total Help needed standing up from a chair using your arms (e.g., wheelchair or bedside chair)?: Total Help needed to walk in hospital room?: Total Help needed climbing 3-5 steps with a railing? : Total 6 Click Score: 6    End of Session   Activity Tolerance: Patient tolerated treatment well Patient left: in bed;with call bell/phone within reach;with bed alarm set Nurse Communication: Mobility status;Need for lift equipment PT Visit Diagnosis: Muscle weakness (generalized) (M62.81);Difficulty in walking, not elsewhere classified (R26.2)     Time: 0981-1914 PT Time Calculation (min) (ACUTE ONLY): 18 min  Charges:    $Therapeutic Activity: 8-22 mins PT  General Charges $$ ACUTE PT VISIT: 1 Visit                     Renaee Caro, PT, DPT Acute Rehabilitation Services Secure chat preferred Office #: [phone]    Jenna Moan 11/17/2023, 12:09 PM

## 2023-11-17 NOTE — Progress Notes (Signed)
 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 She was d/c to facility 4/14 from [facility] after being treated for sepsis, UTI and she had a stage III sacral decubiti. She has a chronic Foley.    Palliative care has been asked to support additional goals of care conversations.   Today's Discussion 11/17/2023  *Please note that this is a verbal dictation therefore any spelling or grammatical errors are due to the "Dragon Medical One" system interpretation.  Chart reviewed inclusive of vital signs, progress notes, laboratory results, and diagnostic images.   I met with Dana Morton this morning. She was awake and appeared generally uncomfortable. I spoke with her though her response was minimal.  I met with patients husband, Dana Morton this late afternoon at bedside. He shares with me that he and his step-daughter, Dana Morton have a variety of questions in regards to the care for Dana Morton from April 15 - present. They feel an acute change took place. We discussed that he would be interested in learning more from the neurologist and seeing if they have any further insights that perhaps could help her.  Dana Morton is aware that we are limited with what can be done if patients coretrack remains in place. He presently would like her to eat though despite all of the coaxing he can offer she neglects to do so. He understands that her body may not be transmitting signals to eat.   Reviewed that Dana Morton wants more information prior to further decisions. Once this is obtained he is willing to meet with the PMT again in the company of his step son and daughter. I shared that I would support him as I am able to provide the  answers he is seeking.   Questions and concerns addressed/Palliative Support Provided.   Objective Assessment: Vital Signs Vitals:   11/17/23 1224 11/17/23 1546  BP: (!) 126/55 134/70  Pulse: 100 98  Resp: 17 17  Temp: 100 F (37.8 C) 97.9 F (36.6 C)  SpO2: 95% 97%    Intake/Output Summary (Last 24 hours) at 11/17/2023 1621 Last data filed at 11/17/2023 1300 Gross per 24 hour  Intake 1325.43 ml  Output 1250 ml  Net 75.43 ml   Last Weight  Most recent update: 11/17/2023  4:25 AM    Weight  97.1 kg (214 lb)            Gen:  Elderly Caucasian F chronically ill appearing HEENT: Coretrack, Dry mucous membranes CV: Regular rate and rhythm  PULM:  On RA, breathing is even and nonlabored ABD: soft/nontender/nondistended  EXT: (+) edema  Neuro: Oriented to self, expressive aphasia  SUMMARY OF RECOMMENDATIONS   DNAR/DNI escalation of care to the ICU for pressors/antiarrhythmics/bipap if needed   Patients spouse understands she will not be able to leave the hospital in the setting of the coretrack and that a decision must be made regarding this - either to remove it and see how she does nutritionally, which would be a gamble or removing it and pursuing hospice  Patients family would like the primary medical team to review records from April 15 - present to identify if any major changes have taken place to account for her mentation changes  Patients husband is  hopeful for a Neurology consultation for further clarity on her mental status changes over the past few weeks  Plan for ongoing conversations once the above information is provided  Ongoing PMT support  Billing based on MDM: High ______________________________________________________________________________________ Camille Cedars [facility] Palliative Medicine Team Team Cell Phone: [phone] Please utilize secure chat with additional questions, if there is no response within 30 minutes please call the above phone  number  Palliative Medicine Team providers are available by phone from 7am to 7pm daily and can be reached through the team cell phone.  Should this patient require assistance outside of these hours, please call the patient's attending physician.

## 2023-11-18 DIAGNOSIS — Z7189 Other specified counseling: Secondary | ICD-10-CM | POA: Diagnosis not present

## 2023-11-18 DIAGNOSIS — G9341 Metabolic encephalopathy: Secondary | ICD-10-CM | POA: Diagnosis not present

## 2023-11-18 DIAGNOSIS — R6521 Severe sepsis with septic shock: Secondary | ICD-10-CM

## 2023-11-18 DIAGNOSIS — R4701 Aphasia: Secondary | ICD-10-CM

## 2023-11-18 DIAGNOSIS — A419 Sepsis, unspecified organism: Secondary | ICD-10-CM | POA: Diagnosis not present

## 2023-11-18 LAB — GLUCOSE, CAPILLARY
Glucose-Capillary: 169 mg/dL — ABNORMAL HIGH (ref 70–99)
Glucose-Capillary: 100 mg/dL — ABNORMAL HIGH (ref 70–99)
Glucose-Capillary: 180 mg/dL — ABNORMAL HIGH (ref 70–99)
Glucose-Capillary: 197 mg/dL — ABNORMAL HIGH (ref 70–99)
Glucose-Capillary: 241 mg/dL — ABNORMAL HIGH (ref 70–99)

## 2023-11-18 LAB — MAGNESIUM: Magnesium: 1.8 mg/dL (ref 1.7–2.4)

## 2023-11-18 LAB — BASIC METABOLIC PANEL WITH GFR
Anion gap: 10 (ref 5–15)
BUN: 39 mg/dL — ABNORMAL HIGH (ref 8–23)
CO2: 27 mmol/L (ref 22–32)
Calcium: 9.4 mg/dL (ref 8.9–10.3)
Chloride: 102 mmol/L (ref 98–111)
Creatinine, Ser: 0.6 mg/dL (ref 0.44–1.00)
GFR, Estimated: >60 mL/min (ref >60)
Glucose, Bld: 185 mg/dL — ABNORMAL HIGH (ref 70–99)
Potassium: 3.8 mmol/L (ref 3.5–5.1)
Sodium: 139 mmol/L (ref 135–145)

## 2023-11-18 LAB — CBC
HCT: 24.1 % — ABNORMAL LOW (ref 36.0–46.0)
Hemoglobin: 8 g/dL — ABNORMAL LOW (ref 12.0–15.0)
MCH: 35.2 pg — ABNORMAL HIGH (ref 26.0–34.0)
MCHC: 33.2 g/dL (ref 30.0–36.0)
MCV: 106.2 fL — ABNORMAL HIGH (ref 80.0–100.0)
Platelets: 41 10*3/uL — ABNORMAL LOW (ref 150–400)
RBC: 2.27 MIL/uL — ABNORMAL LOW (ref 3.87–5.11)
RDW: 22.2 % — ABNORMAL HIGH (ref 11.5–15.5)
WBC: 5.8 10*3/uL (ref 4.0–10.5)
nRBC: 1.4 % — ABNORMAL HIGH (ref 0.0–0.2)

## 2023-11-18 MED ORDER — MAGNESIUM SULFATE 2 GM/50ML IV SOLN
2.0000 g | Freq: Once | INTRAVENOUS | Status: AC
  Administered 2023-11-18: 2 g via INTRAVENOUS
  Filled 2023-11-18: qty 50

## 2023-11-18 MED ORDER — FLUCONAZOLE 40 MG/ML PO SUSR
200.0000 mg | Freq: Every day | ORAL | Status: DC
  Administered 2023-11-18 – 2023-11-22 (×5): 200 mg
  Filled 2023-11-18 (×5): qty 5

## 2023-11-18 MED ORDER — POTASSIUM CHLORIDE CRYS ER 20 MEQ PO TBCR
40.0000 meq | EXTENDED_RELEASE_TABLET | Freq: Once | ORAL | Status: AC
  Administered 2023-11-18: 40 meq via ORAL
  Filled 2023-11-18: qty 2

## 2023-11-18 NOTE — TOC Progression Note (Signed)
 Transition of Care [facility]) - Progression Note    Patient Details  Name: Dana Morton MRN: [id number] Date of Birth: Aug 05, 1950  Transition [facility] Care [facility]) CM/SW Contact  Juliane Och, LCSW Phone Number: 11/18/2023, 5:00 PM  Clinical Narrative:     5:00 PM Per chart review, patient's family has yet to decide whether or not to pursue comfort care on behalf of patient. Patient's family are to meet with Palliative Care NP tomorrow.  Expected Discharge Plan: Skilled Nursing Facility Barriers to Discharge: Continued Medical Work up  Expected Discharge Plan and Services In-house Referral: Clinical Social Work     Living arrangements for the past 2 months: Skilled Holiday representative, Single Family Home                                       Social Determinants of Health (SDOH) Interventions SDOH Screenings   Food Insecurity: No Food Insecurity (11/07/2023)  Housing: Low Risk  (11/07/2023)  Transportation Needs: Patient Unable To Answer (11/07/2023)  Utilities: Patient Unable To Answer (11/07/2023)  Financial Resource Strain: Low Risk  (10/03/2023)   Received from [facility]  Social Connections: Unknown (11/09/2023)  Tobacco Use: Low Risk  (11/05/2023)    Readmission Risk Interventions     No data to display

## 2023-11-18 NOTE — Progress Notes (Signed)
 Progress Note    Dana Morton   NWG:[id number]  DOB: October 31, 1950  DOA: 11/05/2023     13 PCP: Melchor Spoon, MD  Initial CC: Fall at nursing [facility] Course: Dana Morton is a 73 y/o female with with past medical history of glioblastoma s/p resection 05/2023 at [facility], Seizure disorder secondary to tumor/radiation , HTN, Hypothyroidism presented to hospital via skilled nursing facility after sustaining a fall.   Patient was recently discharged from rehab facility on 4/14.  Patient was not taking her medications and food at home and had been bedbound.  According to husband, who was at the beside, she has been refusing her food and meds for several weeks.  In the ED patient was hypotensive and required vasopressors.  Lactic acid was 2.2 initially.  Creatinine was 3.2 from baseline 0.7.[name]  At this time patient has been transferred out of the ICU.    A&P:  Acute metabolic encephalopathy H/o Seizures  History of glioblastoma s/p resection Failure to thrive Goals of care discussion Continue AEDs with Vimpat  and Keppra ..  Continue dexamethasone . - have treated infection, nutrition, empiric tx for HSV with steroid/valtrex  and patient having no improvement; also s/p EEG/LTM with no active seizure activity - Given aggressive treatments and extensive workup dating back to January (but much further still) along with underlying poor prognosis and functional decline, have discussed persistent decline with her husband.  She has not improved with maximal efforts and appears to be more appropriate for transitioning to hospice at this time; I have relayed this recommendation to him.  Also discussed case with neurology on 11/18/2023 and there is no further need for EEG monitoring; I do not think neuoro-onc will have much to offer - tentative plan for husband to come to hospital on 5/8 and talk to [facility] then we may be transitioning to comfort/hospice at that time  Septic shock, secondary to UTI -  resolved Patient initially received vasopressors.  Foley catheter was changed.  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  Urine culture with no growth.  Blood cultures negative in 4 days.  Afebrile.  AKI on CKD - resolved   Acute hypoxic respiratory failure Currently on room air   Sacral decub, unstageable present on admission - not a good surgical candidate at this time    Severe Calorie malnutrition, present on admission Refeeding syndrome On cortrak tube and tube feeding.  Currently on Osmolite 1.5.  And Prosource   Anemia of chronic disease Thrombocytopenia Received 1 unit of packed RBC.  Continue to monitor closely.  Significant thrombocytopenia.   Left toe swelling Secondary to chronic gout.  Continue to monitor.  Hypothyroidism.  Continue Synthroid   Sacral decub, left unstageable present on admission, right buttocks stage II ulceration present on admission Pressure Injury 10/20/23 Buttocks Right Stage 2 -  Partial thickness loss of dermis presenting as a shallow open injury with a red, pink wound bed without slough. the area is pink in the right buttocks (Active)  10/20/23 1500  Location: Buttocks  Location Orientation: Right  Staging: Stage 2 -  Partial thickness loss of dermis presenting as a shallow open injury with a red, pink wound bed without slough.  Wound Description (Comments): the area is pink in the right buttocks  Present on Admission: Yes     Pressure Injury 11/06/23 Buttocks Left Unstageable - Full thickness tissue loss in which the base of the injury is covered by slough (yellow, tan, gray, green or brown)  and/or eschar (tan, brown or black) in the wound bed. Full tissue loss in which base  (Active)  11/06/23 0430  Location: Buttocks  Location Orientation: Left  Staging: Unstageable - Full thickness tissue loss in which the base of the injury is covered by slough (yellow, tan, gray, green or brown) and/or eschar (tan, brown or black) in the wound  bed.  Wound Description (Comments): Full tissue loss in which base of injury covered by slough  Present on Admission: Yes   Wound care recommending surgical intervention and possible debridement in a nonurgent basis.   Hypokalemia.  Will replace as necessary.   Hypomagnesemia.  Replete as needed   Class I obesity.Body mass index is 33.52 kg/m.  Nutrition on board.  On tube feeding at this time.   Concern for Herpes Zoster s/p Valtrex  1000 g 3 times daily for 7 days.  On airborne isolation precautions due to wet area of rash  Interval History:  No events overnight.  Laying in bed with mittens in place and spontaneously moving in bed but does not follow any commands.  Was able to stick her tongue out on command once but could not repeat. Discussed poor prognosis with husband on the phone and he plans to come to the hospital tomorrow for further discussions and likely transitioning to comfort care at that time.   Old records reviewed in assessment of this patient  Antimicrobials:   DVT prophylaxis:  SCDs Start: 11/05/23 2151   Code Status:   Code Status: Limited: Do not attempt resuscitation (DNR) -DNR-LIMITED -Do Not Intubate/DNI   Mobility Assessment (Last 72 Hours)     Mobility Assessment     Row Name 11/18/23 0752 11/17/23 2000 11/17/23 1200 11/17/23 0800 11/16/23 1928   Does patient have an order for bedrest or is patient medically unstable No - Continue assessment No - Continue assessment -- No - Continue assessment Yes- Bedfast (Level 1) - Complete   What is the highest level of mobility based on the progressive mobility assessment? Level 1 (Bedfast) - Unable to balance while sitting on edge of bed Level 1 (Bedfast) - Unable to balance while sitting on edge of bed Level 1 (Bedfast) - Unable to balance while sitting on edge of bed Level 1 (Bedfast) - Unable to balance while sitting on edge of bed Level 1 (Bedfast) - Unable to balance while sitting on edge of bed   Is the above  level different from baseline mobility prior to current illness? Yes - Recommend PT order Yes - Recommend PT order -- Yes - Recommend PT order Yes - Recommend PT order    Row Name 11/16/23 0800 11/15/23 1950 11/15/23 1948       Does patient have an order for bedrest or is patient medically unstable No - Continue assessment No - Continue assessment No - Continue assessment     What is the highest level of mobility based on the progressive mobility assessment? Level 1 (Bedfast) - Unable to balance while sitting on edge of bed Level 1 (Bedfast) - Unable to balance while sitting on edge of bed Level 1 (Bedfast) - Unable to balance while sitting on edge of bed     Is the above level different from baseline mobility prior to current illness? Yes - Recommend PT order Yes - Recommend PT order Yes - Recommend PT order              Barriers to discharge:  Disposition Plan:  TBD, possible hospice Status  is: Inpt  Objective: Blood pressure (!) 133/54, pulse 98, temperature 98.7 F (37.1 C), temperature source Oral, resp. rate 20, height 5\' 7"  (1.702 m), weight 96.6 kg, SpO2 97%.  Examination:  Physical Exam Constitutional:      Comments: Chronically ill-appearing elderly woman lying in bed appearing uncomfortable.  Unable to speak or interact meaningfully  HENT:     Head: Normocephalic and atraumatic.     Mouth/Throat:     Mouth: Mucous membranes are moist.  Eyes:     Pupils: Pupils are equal, round, and reactive to light.  Cardiovascular:     Rate and Rhythm: Normal rate and regular rhythm.  Pulmonary:     Effort: Pulmonary effort is normal. No respiratory distress.     Breath sounds: Normal breath sounds. No wheezing.  Abdominal:     General: Bowel sounds are normal. There is no distension.     Palpations: Abdomen is soft.     Tenderness: There is no abdominal tenderness.  Musculoskeletal:        General: No swelling.     Cervical back: Normal range of motion and neck supple.   Skin:    General: Skin is warm and dry.  Neurological:     Comments: Was able to push tongue out on command but not consistently otherwise could follow no other commands      Consultants:  Palliative care   Procedures:    Data Reviewed: Results for orders placed or performed during the hospital encounter of 11/05/23 (from the past 24 hours)  Glucose, capillary     Status: Abnormal   Collection Time: 11/17/23  3:43 PM  Result Value Ref Range   Glucose-Capillary 188 (H) 70 - 99 mg/dL  Glucose, capillary     Status: Abnormal   Collection Time: 11/17/23  7:19 PM  Result Value Ref Range   Glucose-Capillary 180 (H) 70 - 99 mg/dL  Glucose, capillary     Status: Abnormal   Collection Time: 11/17/23 11:05 PM  Result Value Ref Range   Glucose-Capillary 188 (H) 70 - 99 mg/dL  Glucose, capillary     Status: Abnormal   Collection Time: 11/18/23  3:40 AM  Result Value Ref Range   Glucose-Capillary 169 (H) 70 - 99 mg/dL  CBC     Status: Abnormal   Collection Time: 11/18/23  4:50 AM  Result Value Ref Range   WBC 5.8 4.0 - 10.5 K/uL   RBC 2.27 (L) 3.87 - 5.11 MIL/uL   Hemoglobin 8.0 (L) 12.0 - 15.0 g/dL   HCT 30.8 (L) 65.7 - 84.6 %   MCV 106.2 (H) 80.0 - 100.0 fL   MCH 35.2 (H) 26.0 - 34.0 pg   MCHC 33.2 30.0 - 36.0 g/dL   RDW 96.2 (H) 95.2 - 84.1 %   Platelets 41 (L) 150 - 400 K/uL   nRBC 1.4 (H) 0.0 - 0.2 %  Basic metabolic panel with GFR     Status: Abnormal   Collection Time: 11/18/23  4:50 AM  Result Value Ref Range   Sodium 139 135 - 145 mmol/L   Potassium 3.8 3.5 - 5.1 mmol/L   Chloride 102 98 - 111 mmol/L   CO2 27 22 - 32 mmol/L   Glucose, Bld 185 (H) 70 - 99 mg/dL   BUN 39 (H) 8 - 23 mg/dL   Creatinine, Ser 3.24 0.44 - 1.00 mg/dL   Calcium  9.4 8.9 - 10.3 mg/dL   GFR, Estimated >40 >10 mL/min   Anion  gap 10 5 - 15  Magnesium      Status: None   Collection Time: 11/18/23  4:50 AM  Result Value Ref Range   Magnesium  1.8 1.7 - 2.4 mg/dL  Glucose, capillary      Status: Abnormal   Collection Time: 11/18/23  7:49 AM  Result Value Ref Range   Glucose-Capillary 100 (H) 70 - 99 mg/dL  Glucose, capillary     Status: Abnormal   Collection Time: 11/18/23 11:31 AM  Result Value Ref Range   Glucose-Capillary 180 (H) 70 - 99 mg/dL    I have reviewed pertinent nursing notes, vitals, labs, and images as necessary. I have ordered labwork to follow up on as indicated.  I have reviewed the last notes from staff over past 24 hours. I have discussed patient's care plan and test results with nursing staff, CM/SW, and other staff as appropriate.  Time spent: Greater than 50% of the 55 minute visit was spent in counseling/coordination of care for the patient as laid out in the A&P.   LOS: 13 days   Faith Homes, MD [facility] 11/18/2023, 1:44 PM

## 2023-11-18 NOTE — Progress Notes (Signed)
   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 She was d/c to facility 4/14 from [facility] after being treated for sepsis, UTI and she had a stage III sacral decubiti. She has a chronic Foley.    Palliative care has been asked to support additional goals of care conversations.   Today's Discussion 11/18/2023  *Please note that this is a verbal dictation therefore any spelling or grammatical errors are due to the "Dragon Medical One" system interpretation.  Chart reviewed inclusive of vital signs, progress notes, laboratory results, and diagnostic images.   I spoke to patients RN, Craige Dixon who endorses no concerns at this point in time.   I have collaborated via secure chat with Dr. Gladstone Lamer who plans to reach out to patients spouse this morning. He also has coordinated with the neurology team to provide family greater insights.   I met with Dana Morton this morning. She is awake and speaking though her words are difficult to interpret due to her aphasia. She overall appears agitated.   I have called and spoke to patients spouse, Dana Morton. He shares with me that he feels relieved to have spoken to Dr. Gladstone Lamer and realizes a decision has to be made for his wife. He would like to speak to his stepchildren today to determine a better time. We reviewed the plan for a meeting tomorrow likely mid afternoon for further decisions related to goals of care.  Questions and concerns addressed/Palliative Support Provided.   Objective Assessment: Vital Signs Vitals:   11/18/23 1134 11/18/23 1135  BP: (!) 133/54 (!) 133/54  Pulse:    Resp: (!) 21 20  Temp: 98.7 F (37.1 C)   SpO2: 97%      Intake/Output Summary (Last 24 hours) at 11/18/2023 1305 Last data filed at 11/18/2023 1137 Gross per 24 hour  Intake 15.98 ml  Output 2250 ml  Net -2234.02 ml   Last Weight  Most recent update: 11/18/2023  3:57 AM    Weight  96.6 kg (213 lb)            Gen:  Elderly Caucasian F chronically ill appearing HEENT: Coretrack, Dry mucous membranes CV: Regular rate and rhythm  PULM:  On RA, breathing is even and nonlabored ABD: soft/nontender/nondistended  EXT: (+) edema  Neuro: Oriented to self, expressive aphasia  SUMMARY OF RECOMMENDATIONS   DNAR/DNI escalation of care to the ICU for pressors/antiarrhythmics/bipap if needed   Appreciate Dr. Gladstone Lamer speaking with patients spouse in detail about her current clinical state  Patients spouse would like to have another meeting tomorrow in afternoon to determine the path from hospitalization for Dana Morton  Ongoing PMT support  Time: 52 ______________________________________________________________________________________ Camille Cedars [facility] Palliative Medicine Team Team Cell Phone: [phone] Please utilize secure chat with additional questions, if there is no response within 30 minutes please call the above phone number  Palliative Medicine Team providers are available by phone from 7am to 7pm daily and can be reached through the team cell phone.  Should this patient require assistance outside of these hours, please call the patient's attending physician.

## 2023-11-18 NOTE — Plan of Care (Signed)
  Problem: Metabolic: Goal: Ability to maintain appropriate glucose levels will improve Outcome: Progressing   Problem: Nutritional: Goal: Maintenance of adequate nutrition will improve Outcome: Progressing

## 2023-11-19 DIAGNOSIS — R4701 Aphasia: Secondary | ICD-10-CM | POA: Diagnosis not present

## 2023-11-19 DIAGNOSIS — G9341 Metabolic encephalopathy: Secondary | ICD-10-CM | POA: Diagnosis not present

## 2023-11-19 DIAGNOSIS — Z7189 Other specified counseling: Secondary | ICD-10-CM | POA: Diagnosis not present

## 2023-11-19 DIAGNOSIS — A419 Sepsis, unspecified organism: Secondary | ICD-10-CM | POA: Diagnosis not present

## 2023-11-19 LAB — GLUCOSE, CAPILLARY
Glucose-Capillary: 113 mg/dL — ABNORMAL HIGH (ref 70–99)
Glucose-Capillary: 160 mg/dL — ABNORMAL HIGH (ref 70–99)
Glucose-Capillary: 176 mg/dL — ABNORMAL HIGH (ref 70–99)
Glucose-Capillary: 203 mg/dL — ABNORMAL HIGH (ref 70–99)
Glucose-Capillary: 212 mg/dL — ABNORMAL HIGH (ref 70–99)
Glucose-Capillary: 214 mg/dL — ABNORMAL HIGH (ref 70–99)
Glucose-Capillary: 270 mg/dL — ABNORMAL HIGH (ref 70–99)

## 2023-11-19 MED ORDER — CARMEX CLASSIC LIP BALM EX OINT
TOPICAL_OINTMENT | CUTANEOUS | Status: DC | PRN
  Filled 2023-11-19: qty 10

## 2023-11-19 NOTE — Progress Notes (Signed)
 Spoke with pt's daughter Trevor Fudge over the phone & she conveyed concern regarding mouth sores potentially inhibiting pt's ability to eat. Pt's daughter also had concerns for thrush. This RN has not assessed any white patches/blisters on the tongue or throat when conducting oral care.   Pt's lips are dry and cracked. Attempted to notify Michell Ahumada, MD. Awaiting further orders.  Sonjia Durie, RN

## 2023-11-19 NOTE — Progress Notes (Signed)
 OT Cancellation and Discharge  Patient Details Name: ALLEANE GLUCK MRN: [id number] DOB: 02-Jan-1951   Cancelled Treatment:    Reason Eval/Treat Not Completed: Other (comment) (Per conversation with RN, pt will be discharging to inpatient hospice care. Per RN request, acute OT signing off at this time.)  Giovany Cosby "Jenine Mix" M., OTR/L, MA Acute Rehab [phone]   Walt Gunner 11/19/2023, 4:04 PM

## 2023-11-19 NOTE — Progress Notes (Signed)
 [facility] Liaison Note   Received request for family interest in [facility]. Met with husband at bedside and discussed hospice philosophy and inpatient hospice care. Patient is approved for [facility] inpatient hospice care by Dr. Tessie Fila, hospice provider.   Unfortunately, [facility] is not able to offer a bed today. Family and [facility] manager aware.   Hospital liaison will follow up tomorrow or sooner if a room becomes available.   Please do not hesitate to call with questions.   Madelene Schanz, BSN, RN, [facility] Hospice Liaison [phone]

## 2023-11-19 NOTE — TOC Progression Note (Signed)
 Transition of Care [facility]) - Progression Note    Patient Details  Name: Dana Morton MRN: [id number] Date of Birth: 1950-11-10  Transition of Care [facility]) CM/SW Contact  Juliane Och, LCSW Phone Number: 11/19/2023, 2:49 PM  Clinical Narrative:     2:49 PM Palliative Care NP consulted Shawn of [facility] and informed medical team that patient's family are agreeable to IP hospice. Patient's family expressed interest in [facility]. [facility] is to follow up.  Expected Discharge Plan: Hospice Medical Facility Barriers to Discharge: Hospice Bed not available, Continued Medical Work up  Expected Discharge Plan and Services In-house Referral: Clinical Social Work   Post Acute Care Choice: Residential Hospice Bed Living arrangements for the past 2 months: Skilled Nursing Facility, Single Family Home                                       Social Determinants of Health (SDOH) Interventions SDOH Screenings   Food Insecurity: No Food Insecurity (11/07/2023)  Housing: Low Risk  (11/07/2023)  Transportation Needs: Patient Unable To Answer (11/07/2023)  Utilities: Patient Unable To Answer (11/07/2023)  Financial Resource Strain: Low Risk  (10/03/2023)   Received from [facility]  Social Connections: Unknown (11/09/2023)  Tobacco Use: Low Risk  (11/05/2023)    Readmission Risk Interventions     No data to display

## 2023-11-19 NOTE — Progress Notes (Signed)
 Progress Note    Dana Morton   ZOX:[id number]  DOB: [birth year]  DOA: 11/05/2023     14 PCP: Melchor Spoon, MD  Initial CC: Fall at nursing [facility] Course: 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   Patient was recently discharged from rehab facility on 4/14.  Patient was not taking her medications and food at home and had been bedbound.  According to husband, who was at the beside, she has been refusing her food and meds for several weeks.  In the ED patient was hypotensive and required vasopressors.  Lactic acid was 2.2 initially.  Creatinine was 3.2 from baseline 0.7.[name]  At this time patient has been transferred out of the ICU.    A&P:  Acute metabolic encephalopathy H/o Seizures  History of glioblastoma s/p resection Failure to thrive Goals of care discussion Continue AEDs with Vimpat  and Keppra ..  Continue dexamethasone . - have treated infection, nutrition, empiric tx for HSV with steroid/valtrex  and patient having no improvement; also s/p EEG/LTM with no active seizure activity - Given aggressive treatments and extensive workup dating back to January (but much further still) along with underlying poor prognosis and functional decline, have discussed persistent decline with her husband.  She has not improved with maximal efforts and appears to be more appropriate for transitioning to hospice at this time; I have relayed this recommendation to him.  Also discussed case with neurology on 11/18/2023 and there is no further need for EEG monitoring; I do not think neuoro-onc will have much to offer - discussed at length with daughter on phone also on 5/8 - await further discussion with meeting with PCM on 5/8  Septic shock, secondary to UTI - resolved Patient  initially received vasopressors.  Foley catheter was changed.  S/p vancomycin  and meropenem  due to history of ESBL.  Urine culture with no growth.  Blood cultures negative  AKI on CKD - resolved   Acute hypoxic respiratory failure Currently on room air   Sacral decub, unstageable present on admission - not a good surgical candidate at this time    Severe Calorie malnutrition, present on admission Refeeding syndrome On cortrak tube and tube feeding - Further plan pending palliative care discussions with family   Anemia of chronic disease Thrombocytopenia Received 1 unit of packed RBC.  Continue to monitor closely.  Significant thrombocytopenia.   Left toe swelling Secondary to chronic gout.  Continue to monitor.  Hypothyroidism.  Continue Synthroid   Sacral decub, left unstageable present on admission, right buttocks stage II ulceration present on admission Pressure Injury 10/20/23 Buttocks Right Stage 2 -  Partial thickness loss of dermis presenting as a shallow open injury with a red, pink wound bed without slough. the area is pink in the right buttocks (Active)  10/20/23 1500  Location: Buttocks  Location Orientation: Right  Staging: Stage 2 -  Partial thickness loss of dermis presenting as a shallow open injury with a red, pink wound bed without slough.  Wound Description (Comments): the area is pink in the right buttocks  Present on Admission: Yes     Pressure Injury 11/06/23 Buttocks Left Unstageable - Full thickness tissue loss in which the base of the injury is covered by slough (yellow, tan, gray, green or brown) and/or eschar (tan, brown or black) in the  wound bed. Full tissue loss in which base  (Active)  11/06/23 0430  Location: Buttocks  Location Orientation: Left  Staging: Unstageable - Full thickness tissue loss in which the base of the injury is covered by slough (yellow, tan, gray, green or brown) and/or eschar (tan, brown or black) in the wound bed.  Wound  Description (Comments): Full tissue loss in which base of injury covered by slough  Present on Admission: Yes   Wound care recommending surgical intervention and possible debridement in a nonurgent basis.   Hypokalemia.  Will replace as necessary.   Hypomagnesemia.  Replete as needed   Class I obesity.Body mass index is 33.52 kg/m.  Nutrition on board.  On tube feeding at this time.   Concern for Herpes Zoster s/p Valtrex  1000 g 3 times daily for 7 days. Will d/c airborne precautions if no more rash  Interval History:  No events overnight.  Remains noncommunicative in bed.  Babbling when asked a question.  Not following any commands for me today.  Mostly seems restless. Call and spoke with her daughter this morning as well and discussed workup at length.  Plan for family meeting today.    Old records reviewed in assessment of this patient  Antimicrobials:   DVT prophylaxis:  SCDs Start: 11/05/23 2151   Code Status:   Code Status: Limited: Do not attempt resuscitation (DNR) -DNR-LIMITED -Do Not Intubate/DNI   Mobility Assessment (Last 72 Hours)     Mobility Assessment     Row Name 11/19/23 0733 11/18/23 2100 11/18/23 0752 11/17/23 2000 11/17/23 1200   Does patient have an order for bedrest or is patient medically unstable No - Continue assessment No - Continue assessment No - Continue assessment No - Continue assessment --   What is the highest level of mobility based on the progressive mobility assessment? Level 1 (Bedfast) - Unable to balance while sitting on edge of bed Level 1 (Bedfast) - Unable to balance while sitting on edge of bed Level 1 (Bedfast) - Unable to balance while sitting on edge of bed Level 1 (Bedfast) - Unable to balance while sitting on edge of bed Level 1 (Bedfast) - Unable to balance while sitting on edge of bed   Is the above level different from baseline mobility prior to current illness? Yes - Recommend PT order -- Yes - Recommend PT order Yes -  Recommend PT order --    Row Name 11/17/23 0800 11/16/23 1928         Does patient have an order for bedrest or is patient medically unstable No - Continue assessment Yes- Bedfast (Level 1) - Complete      What is the highest level of mobility based on the progressive mobility assessment? Level 1 (Bedfast) - Unable to balance while sitting on edge of bed Level 1 (Bedfast) - Unable to balance while sitting on edge of bed      Is the above level different from baseline mobility prior to current illness? Yes - Recommend PT order Yes - Recommend PT order               Barriers to discharge:  Disposition Plan:  TBD, possible hospice Status is: Inpt  Objective: Blood pressure (!) 116/58, pulse 100, temperature 98 F (36.7 C), temperature source Oral, resp. rate 18, height 5\' 7"  (1.702 m), weight 96.6 kg, SpO2 100%.  Examination:  Physical Exam Constitutional:      Comments: Chronically ill-appearing elderly woman lying in bed appearing uncomfortable.  Unable to speak or interact meaningfully  HENT:     Head: Normocephalic and atraumatic.     Mouth/Throat:     Mouth: Mucous membranes are moist.  Eyes:     Pupils: Pupils are equal, round, and reactive to light.  Cardiovascular:     Rate and Rhythm: Normal rate and regular rhythm.  Pulmonary:     Effort: Pulmonary effort is normal. No respiratory distress.     Breath sounds: Normal breath sounds. No wheezing.  Abdominal:     General: Bowel sounds are normal. There is no distension.     Palpations: Abdomen is soft.     Tenderness: There is no abdominal tenderness.  Musculoskeletal:        General: No swelling.     Cervical back: Normal range of motion and neck supple.  Skin:    General: Skin is warm and dry.  Neurological:     Comments: Did not follow any commands for me today      Consultants:  Palliative care   Procedures:    Data Reviewed: Results for orders placed or performed during the hospital encounter of  11/05/23 (from the past 24 hours)  Glucose, capillary     Status: Abnormal   Collection Time: 11/18/23  4:20 PM  Result Value Ref Range   Glucose-Capillary 197 (H) 70 - 99 mg/dL  Glucose, capillary     Status: Abnormal   Collection Time: 11/18/23  8:04 PM  Result Value Ref Range   Glucose-Capillary 241 (H) 70 - 99 mg/dL  Glucose, capillary     Status: Abnormal   Collection Time: 11/19/23 12:15 AM  Result Value Ref Range   Glucose-Capillary 176 (H) 70 - 99 mg/dL  Glucose, capillary     Status: Abnormal   Collection Time: 11/19/23  4:31 AM  Result Value Ref Range   Glucose-Capillary 212 (H) 70 - 99 mg/dL  Glucose, capillary     Status: Abnormal   Collection Time: 11/19/23  8:04 AM  Result Value Ref Range   Glucose-Capillary 113 (H) 70 - 99 mg/dL  Glucose, capillary     Status: Abnormal   Collection Time: 11/19/23 11:23 AM  Result Value Ref Range   Glucose-Capillary 160 (H) 70 - 99 mg/dL    I have reviewed pertinent nursing notes, vitals, labs, and images as necessary. I have ordered labwork to follow up on as indicated.  I have reviewed the last notes from staff over past 24 hours. I have discussed patient's care plan and test results with nursing staff, CM/SW, and other staff as appropriate.  Time spent: Greater than 50% of the 55 minute visit was spent in counseling/coordination of care for the patient as laid out in the A&P.   LOS: 14 days   Faith Homes, MD [facility] 11/19/2023, 2:22 PM

## 2023-11-19 NOTE — Progress Notes (Signed)
 Palliative Medicine Inpatient Follow Up Note HPI: 73 y/o female with PMH for Glioblastoma (causing aphasia and word finding) s/p resection 05/2023 at [facility], Seizure d/o secondary to tumor/radiation (mostly Absense type seizures), HTN, Hypothyroidism who was admitted to [facility] 4/8 for fall from bed at the rehab/nursing facility where she lives since her resection. She was d/c to facility 4/14 from [facility] after being treated for sepsis, UTI and she had a stage III sacral decubiti. She has a chronic Foley.    Palliative care has been asked to support additional goals of care conversations.   Today's Discussion 11/19/2023  *Please note that this is a verbal dictation therefore any spelling or grammatical errors are due to the "Dragon Medical One" system interpretation.  Chart reviewed inclusive of vital signs, progress notes, laboratory results, and diagnostic images.   I met with Dana Morton at bedside. She remains to be confused with her mittens on and appears generally uncomfortable.  _______________________________________________ Family meeting held at 1:30PM  I met with patients spouse, Dana Morton and spoke with his daughter, Dana Morton and son, Dana Morton on speaker phone.   Patients daughter endorses having spoken to Dr. Gladstone Lamer and shares understanding that the medical team has explored all avenues for her ongoing encephalopathy inclusive of thorough medical review and speaking to the neurology team.  Patients spouse, daughter, and son acknowledge all that has been done for Dana Morton since being here.   We talked about transition to comfort measures in house and what that would entail inclusive of medications to control pain, dyspnea, agitation, nausea, itching, and hiccups.    We discussed stopping all uneccessary measures such as cardiac monitoring, blood draws, needle sticks, and frequent vital signs.   Utilized reflective listening throughout our time together.   Patients daughters biggest fear is that  tube feedings and midodrine  would stop in her absence. If this is the case she would prefer to be present with her mother when these interventions are stopped. We have determined the best next step(s) would be to refer to the hospice home in [place] as it would be easier for Dana Morton to get to. Interventions would stop once Dana Morton  gets here and upon transition to the hospice home.   Questions and concerns addressed/Palliative Support Provided.   Objective Assessment: Vital Signs Vitals:   11/19/23 1130 11/19/23 1135  BP: (!) 116/58   Pulse: (!) 108 100  Resp: 18   Temp: 98 F (36.7 C)   SpO2: 100%     Intake/Output Summary (Last 24 hours) at 11/19/2023 1205 Last data filed at 11/19/2023 1133 Gross per 24 hour  Intake 198.99 ml  Output 1400 ml  Net -1201.01 ml   Last Weight  Most recent update: 11/18/2023  3:57 AM    Weight  96.6 kg (213 lb)            Gen:  Elderly Caucasian F chronically ill appearing HEENT: Coretrack, Dry mucous membranes CV: Regular rate and rhythm  PULM:  On RA, breathing is even and nonlabored ABD: soft/nontender/nondistended  EXT: (+) edema  Neuro: Oriented to self, expressive aphasia  SUMMARY OF RECOMMENDATIONS   DNAR/DNI escalation of care to the ICU for pressors/antiarrhythmics/bipap if needed   Plan for referral to Hospice home in [place]  Family would like to continue all interventions including coretrack until prior to transition to hospice home at which point all goals would focus on comfort care  Ongoing PMT support - Josseline Arlester Ladd will resume care tomorrow  Time: 80 MDM: High  ______________________________________________________________________________________ [facility] Palliative Medicine Team Team Cell Phone: [phone] Please utilize secure chat with additional questions, if there is no response within 30 minutes please call the above phone number  Palliative Medicine Team providers are available by  phone from 7am to 7pm daily and can be reached through the team cell phone.  Should this patient require assistance outside of these hours, please call the patient's attending physician.

## 2023-11-20 DIAGNOSIS — G9341 Metabolic encephalopathy: Secondary | ICD-10-CM | POA: Diagnosis not present

## 2023-11-20 DIAGNOSIS — A419 Sepsis, unspecified organism: Secondary | ICD-10-CM | POA: Diagnosis not present

## 2023-11-20 DIAGNOSIS — Z7189 Other specified counseling: Secondary | ICD-10-CM | POA: Diagnosis not present

## 2023-11-20 DIAGNOSIS — C719 Malignant neoplasm of brain, unspecified: Secondary | ICD-10-CM

## 2023-11-20 DIAGNOSIS — R4701 Aphasia: Secondary | ICD-10-CM | POA: Diagnosis not present

## 2023-11-20 LAB — GLUCOSE, CAPILLARY
Glucose-Capillary: 137 mg/dL — ABNORMAL HIGH (ref 70–99)
Glucose-Capillary: 150 mg/dL — ABNORMAL HIGH (ref 70–99)
Glucose-Capillary: 197 mg/dL — ABNORMAL HIGH (ref 70–99)
Glucose-Capillary: 200 mg/dL — ABNORMAL HIGH (ref 70–99)
Glucose-Capillary: 224 mg/dL — ABNORMAL HIGH (ref 70–99)
Glucose-Capillary: 290 mg/dL — ABNORMAL HIGH (ref 70–99)

## 2023-11-20 MED ORDER — ORAL CARE MOUTH RINSE: 15.0000 mL | OROMUCOSAL | Status: DC | PRN

## 2023-11-20 MED ORDER — MIDODRINE HCL 5 MG PO TABS: 5.0000 mg | ORAL_TABLET | Freq: Two times a day (BID) | ORAL | Status: DC

## 2023-11-20 MED ORDER — LEVETIRACETAM 1000 MG PO TABS: 1000.0000 mg | ORAL_TABLET | Freq: Two times a day (BID) | ORAL | Status: DC

## 2023-11-20 MED ORDER — DEXAMETHASONE 1.5 MG PO TABS: 3.0000 mg | ORAL_TABLET | Freq: Every day | ORAL | Status: DC

## 2023-11-20 MED ORDER — LACOSAMIDE 200 MG PO TABS: 200.0000 mg | ORAL_TABLET | Freq: Two times a day (BID) | ORAL | Status: DC

## 2023-11-20 MED ORDER — CLOBAZAM 10 MG PO TABS: 10.0000 mg | ORAL_TABLET | Freq: Two times a day (BID) | ORAL | Status: DC

## 2023-11-20 MED ORDER — JUVEN PO PACK
1.0000 | PACK | Freq: Two times a day (BID) | ORAL | Status: DC
  Administered 2023-11-21 (×2): 1
  Filled 2023-11-20 (×2): qty 1

## 2023-11-20 MED ORDER — WHITE PETROLATUM EX OINT: TOPICAL_OINTMENT | CUTANEOUS | Status: DC | PRN

## 2023-11-20 MED ORDER — FLUCONAZOLE 40 MG/ML PO SUSR: 200.0000 mg | Freq: Every day | ORAL | Status: AC

## 2023-11-20 MED ORDER — WHITE PETROLATUM EX OINT: 1.0000 | TOPICAL_OINTMENT | CUTANEOUS | Status: DC | PRN

## 2023-11-20 NOTE — Progress Notes (Signed)
 [facility] hospital liaison note:  Patient is approved for the hospice home bed was offered and accepted for today however during the process of getting consents signed patient's husband decided that he would like to hold on transfer due to some family concerns.   We will follow up tomorrow Thank you Dwane Gitelman, BSN RN Hospice hospital liaison [phone]

## 2023-11-20 NOTE — Progress Notes (Signed)
 Nutrition Follow-up  DOCUMENTATION CODES:   Severe malnutrition in context of acute illness/injury  INTERVENTION:  Continue tube feeding via cortrak tube: Osmolite 1.5 at 83 ml/h (996 ml per day) x12 hours (6PM-6AM) Prosource TF20 60 ml 2x/d   Provides 1680 kcal (93% of minimum estimated needs), 102 gm protein (107% of minimum estimated needs), 762 ml free water daily   Continue MVI with minerals Continue 1 packet Juven BID, each packet provides 95 calories, 2.5 grams of protein (collagen), + micronutrients to support wound healing Monitor speech notes for diet advancement/tolerance Continue Ensure Enlive po TID, each supplement provides 350 kcal and 20 grams of protein.  Continue Magic cup TID with meals, each supplement provides 290 kcal and 9 grams of protein   NUTRITION DIAGNOSIS:  Severe Malnutrition related to acute illness (recurrent hospitalizations and rehab stays) as evidenced by severe muscle depletion, moderate fat depletion, percent weight loss (10.5% x 3 months).  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 Patient slated to move to [facility] in [place] today. Family has requested to cancel transfer at this time. Will continue nutrition support.   Average Meal Intake 5/5: 0% x3 documented meals 5/6: 0% x2 documented meals 5/9: 0-10% x2 documented meals   Patient intake remains negligible, per RN and documented intake. Remains on full liquid diet and nocturnal tube feedings with no s/s of intolerance. Bowels stable. No reported N/V.   Admit Weight: 99.4kg Current Weight: 96.6kg   Wt stable compared to admission weight. No significant edema documented. Skin integrity issues persist.  Intake/Output Summary (Last 24 hours) at 11/20/2023 1750 Last data filed at 11/20/2023 1300 Gross per 24 hour  Intake 160 ml  Output 1600 ml  Net -1440 ml    Net IO Since Admission: -3,618.27 mL [11/20/23 1750]    Drains/Lines: Cortrak (65cm) PICC (placed 4/23) Foley catheter (chronic) UOP: 1.7L x24 hours   Fluconazole  initiated 2/2 thrush. Has completed vacyclovir.    Meds: famotidine , fluconazole , SSI 0-15 q4, levothyroxine , MVI Drips: 2g Mg sulfate x1   No new labs for review  Diet Order:   Diet Order             Diet full liquid Room service appropriate? Yes with Assist; Fluid consistency: Thin  Diet effective now            EDUCATION NEEDS:   Education needs have been addressed  Skin:  Skin Assessment: Skin Integrity Issues: Skin Integrity Issues:: Stage II Stage II: R buttocks Unstageable: buttocks  Last BM:  5/8  Height:  Ht Readings from Last 1 Encounters:  11/05/23 5\' 7"  (1.702 m)   Weight:  Wt Readings from Last 1 Encounters:  10/20/23 97.5 kg   Ideal Body Weight:  61.4 kg  BMI:  Body mass index is 33.36 kg/m.  Estimated Nutritional Needs:   Kcal:  1800-2000 kcal/d  Protein:  95-110 g/d  Fluid:  2L/d  Con Decant MS, RD, LDN Registered Dietitian Clinical Nutrition RD Inpatient Contact Info in Amion

## 2023-11-20 NOTE — Progress Notes (Signed)
 Speech Language Pathology Discharge Patient Details Name: DESAREE PETRY MRN: [id number] DOB: 12-09-1950   Family has decided to transition Ms. Philips to residential hospice and is waiting for a bed.  Cortrak will remain until time of D/C.  SLP will respectfully sign off.  Antavia Tandy L. Beatris Lincoln, MA CCC/SLP Clinical Specialist - Acute Care SLP Acute Rehabilitation Services Office number [phone]   [name] 11/20/2023, 1:40 PM

## 2023-11-20 NOTE — Progress Notes (Signed)
 Progress Note    Dana Morton   ZOX:[id number]  DOB: Jan 14, 1951  DOA: 11/05/2023     15 PCP: Melchor Spoon, MD  Initial CC: Fall at nursing [facility] Course: 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   Patient was recently discharged from rehab facility on 4/14.  Patient was not taking her medications and food at home and had been bedbound.  According to husband, who was at the beside, she has been refusing her food and meds for several weeks.  In the ED patient was hypotensive and required vasopressors.  Lactic acid was 2.2 initially.  Creatinine was 3.2 from baseline 0.7. See below for further details.  A&P:  Acute metabolic encephalopathy H/o Seizures  History of glioblastoma s/p resection Failure to thrive Goals of care discussion - Continue AEDs with Vimpat  and Keppra .   - Continue dexamethasone . - have treated infection, nutrition, empiric tx for HSV with steroid/valtrex  and patient having no improvement; also s/p EEG/LTM with no active seizure activity - Given aggressive treatments and extensive workup dating back to January (but much further still) along with underlying poor prognosis and functional decline, have discussed persistent decline with her husband.  She has not improved with maximal efforts and appears to be more appropriate for transitioning to hospice at this time; I have relayed this recommendation to him.  Also discussed case with neurology on 11/18/2023 and there is no further need for EEG monitoring; I do not think neuoro-onc will have much to offer - discussed at length with daughter on phone also on 5/8 - transitioned to comfort care after [facility] and family meeting - prior to tx to hospice on 5/9, seems family wishing to cancel transfer for now  as patient stated she did not want to go to hospice; cancelling for now and will discuss next steps with husband/family tomorrow  Septic shock, secondary to UTI - resolved Patient initially received vasopressors.  Foley catheter was changed.  S/p vancomycin  and meropenem  due to history of ESBL.  Urine culture with no growth.  Blood cultures negative  AKI on CKD - resolved   Acute hypoxic respiratory failure Currently on room air   Sacral decub, unstageable present on admission - not a good surgical candidate    Severe Calorie malnutrition, present on admission Refeeding syndrome - s/p cortrak tube and tube feeding - After family discussion, no further tube feeds at discharge   Anemia of chronic disease Thrombocytopenia Received 1 unit of packed RBC.  Significant thrombocytopenia   Left toe swelling Secondary to chronic gout  Hypothyroidism.  Continue Synthroid   Sacral decub, left unstageable present on admission, right buttocks stage II ulceration present on admission Pressure Injury 10/20/23 Buttocks Right Stage 2 -  Partial thickness loss of dermis presenting as a shallow open injury with a red, pink wound bed without slough. the area is pink in the right buttocks (Active)  10/20/23 1500  Location: Buttocks  Location Orientation: Right  Staging: Stage 2 -  Partial thickness loss of dermis presenting as a shallow open injury with a red, pink wound bed without slough.  Wound Description (Comments): the area is pink in the right buttocks  Present on Admission: Yes     Pressure Injury 11/06/23 Buttocks Left Unstageable - Full thickness tissue loss in which the  base of the injury is covered by slough (yellow, tan, gray, green or brown) and/or eschar (tan, brown or black) in the wound bed. Full tissue loss in which base  (Active)  11/06/23 0430  Location: Buttocks  Location Orientation: Left  Staging: Unstageable - Full thickness tissue loss in which the base of the injury is  covered by slough (yellow, tan, gray, green or brown) and/or eschar (tan, brown or black) in the wound bed.  Wound Description (Comments): Full tissue loss in which base of injury covered by slough  Present on Admission: Yes   Wound care recommending surgical intervention and possible debridement in a nonurgent basis.   Hypokalemia.  Repleted   Hypomagnesemia.  Repleted   Class I obesity.Body mass index is 33.52 kg/m.  Nutrition on board.  On tube feeding at this time.   Concern for Herpes Zoster s/p Valtrex  1000 g 3 times daily for 7 days. Will d/c airborne precautions if no more rash; left thigh has crusted over  Interval History:  No events overnight.  Remains noncommunicative in bed.  Babbling when asked a question.  Not following any commands for me today.  Mostly seems restless. No big change for me clinically compared to yesterday. Prior to hospice transfer today, family requested to cancel transfer as patient reportedly stated she did not want to go to hospice.   Old records reviewed in assessment of this patient  Antimicrobials:   DVT prophylaxis:  SCDs Start: 11/05/23 2151   Code Status:   Code Status: Limited: Do not attempt resuscitation (DNR) -DNR-LIMITED -Do Not Intubate/DNI   Mobility Assessment (Last 72 Hours)     Mobility Assessment     Row Name 11/20/23 0751 11/19/23 2030 11/19/23 0733 11/18/23 2100 11/18/23 0752   Does patient have an order for bedrest or is patient medically unstable No - Continue assessment No - Continue assessment No - Continue assessment No - Continue assessment No - Continue assessment   What is the highest level of mobility based on the progressive mobility assessment? Level 1 (Bedfast) - Unable to balance while sitting on edge of bed Level 1 (Bedfast) - Unable to balance while sitting on edge of bed Level 1 (Bedfast) - Unable to balance while sitting on edge of bed Level 1 (Bedfast) - Unable to balance while sitting on edge of bed Level  1 (Bedfast) - Unable to balance while sitting on edge of bed   Is the above level different from baseline mobility prior to current illness? Yes - Recommend PT order -- Yes - Recommend PT order -- Yes - Recommend PT order    Row Name 11/17/23 2000           Does patient have an order for bedrest or is patient medically unstable No - Continue assessment       What is the highest level of mobility based on the progressive mobility assessment? Level 1 (Bedfast) - Unable to balance while sitting on edge of bed       Is the above level different from baseline mobility prior to current illness? Yes - Recommend PT order                Barriers to discharge:  Disposition Plan:  TBD, possible hospice Status is: Inpt  Objective: Blood pressure 127/84, pulse 94, temperature 98.8 F (37.1 C), temperature source Oral, resp. rate 16, height 5\' 7"  (1.702 m), weight 96.6 kg, SpO2 97%.  Examination:  Physical Exam Constitutional:  Comments: Chronically ill-appearing elderly woman lying in bed appearing uncomfortable.  Unable to speak or interact meaningfully  HENT:     Head: Normocephalic and atraumatic.     Mouth/Throat:     Mouth: Mucous membranes are moist.  Eyes:     Pupils: Pupils are equal, round, and reactive to light.  Cardiovascular:     Rate and Rhythm: Normal rate and regular rhythm.  Pulmonary:     Effort: Pulmonary effort is normal. No respiratory distress.     Breath sounds: Normal breath sounds. No wheezing.  Abdominal:     General: Bowel sounds are normal. There is no distension.     Palpations: Abdomen is soft.     Tenderness: There is no abdominal tenderness.  Musculoskeletal:        General: No swelling.     Cervical back: Normal range of motion and neck supple.  Skin:    General: Skin is warm and dry.  Neurological:     Comments: Did not follow any commands for me today      Consultants:  Palliative care   Procedures:    Data Reviewed: Results for  orders placed or performed during the hospital encounter of 11/05/23 (from the past 24 hours)  Glucose, capillary     Status: Abnormal   Collection Time: 11/19/23  5:02 PM  Result Value Ref Range   Glucose-Capillary 203 (H) 70 - 99 mg/dL  Glucose, capillary     Status: Abnormal   Collection Time: 11/19/23  7:34 PM  Result Value Ref Range   Glucose-Capillary 270 (H) 70 - 99 mg/dL   Comment 1 Notify RN   Glucose, capillary     Status: Abnormal   Collection Time: 11/19/23 11:14 PM  Result Value Ref Range   Glucose-Capillary 214 (H) 70 - 99 mg/dL   Comment 1 Notify RN   Glucose, capillary     Status: Abnormal   Collection Time: 11/20/23  4:24 AM  Result Value Ref Range   Glucose-Capillary 137 (H) 70 - 99 mg/dL   Comment 1 Notify RN   Glucose, capillary     Status: Abnormal   Collection Time: 11/20/23  8:04 AM  Result Value Ref Range   Glucose-Capillary 197 (H) 70 - 99 mg/dL  Glucose, capillary     Status: Abnormal   Collection Time: 11/20/23 12:10 PM  Result Value Ref Range   Glucose-Capillary 150 (H) 70 - 99 mg/dL    I have reviewed pertinent nursing notes, vitals, labs, and images as necessary. I have ordered labwork to follow up on as indicated.  I have reviewed the last notes from staff over past 24 hours. I have discussed patient's care plan and test results with nursing staff, CM/SW, and other staff as appropriate.  Time spent: Greater than 50% of the 55 minute visit was spent in counseling/coordination of care for the patient as laid out in the A&P.   LOS: 15 days   Faith Homes, MD [facility] 11/20/2023, 3:55 PM

## 2023-11-20 NOTE — TOC Transition Note (Signed)
 Transition of Care [facility]) - Discharge Note   Patient Details  Name: Dana Morton MRN: [id number] Date of Birth: 05/13/1951  Transition of Care [facility]) CM/SW Contact:  Katrinka Parr, LCSW Phone Number: 11/20/2023, 2:18 PM   Clinical Narrative:     Pt to DC to [facility]. [facility] liaison is completing consents with family. [facility] transport scheduled for 5pm. RN to call report to [phone].   Final next level of care: Skilled Nursing Facility Barriers to Discharge: No Barriers Identified       Discharge Plan and Services Additional resources added to the After Visit Summary for   In-house Referral: Clinical Social Work   Post Acute Care Choice: Residential Hospice Bed                               Social Drivers of Health (SDOH) Interventions SDOH Screenings   Food Insecurity: No Food Insecurity (11/07/2023)  Housing: Low Risk  (11/07/2023)  Transportation Needs: Patient Unable To Answer (11/07/2023)  Utilities: Patient Unable To Answer (11/07/2023)  Financial Resource Strain: Low Risk  (10/03/2023)   Received from [facility]  Social Connections: Unknown (11/09/2023)  Tobacco Use: Low Risk  (11/05/2023)     Readmission Risk Interventions     No data to display

## 2023-11-20 NOTE — Progress Notes (Signed)
 PT Cancellation Note  Patient Details Name: Dana Morton MRN: [id number] DOB: [birth year]   Cancelled Treatment:    Reason Eval/Treat Not Completed: (P) Other (comment), noted from chart review that pt to transition to inpatient Hospice, will hold therapies.   Beverly Buckler. PTA Acute Rehabilitation Services Office: [phone]   Agapito Horseman 11/20/2023, 12:25 PM

## 2023-11-20 NOTE — Discharge Summary (Signed)
 Physician Discharge Summary   Dana Morton:[id number] DOB: 12-Sep-1950 DOA: 11/05/2023  PCP: Melchor Spoon, MD  Admit date: 11/05/2023 Discharge date: 11/20/2023  Admitted From: Home Disposition:  Hospice house Discharging physician: Faith Homes, MD Barriers to discharge: none  Recommendations at discharge: Continue comfort care  Discharge Condition: poor CODE STATUS: DNR Diet recommendation:  Diet Orders (From admission, onward)     Start     Ordered   11/11/23 1523  Diet full liquid Room service appropriate? Yes with Assist; Fluid consistency: Thin  Diet effective now       Comments: Meds whole with puree; allow pt to feed herself with support  Question Answer Comment  Room service appropriate? Yes with Assist   Fluid consistency: Thin      11/11/23 1523            Hospital Course: Ms. 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   Patient was recently discharged from rehab facility on 4/14.  Patient was not taking her medications and food at home and had been bedbound.  According to husband, who was at the beside, she has been refusing her food and meds for several weeks.  In the ED patient was hypotensive and required vasopressors.  Lactic acid was 2.2 initially.  Creatinine was 3.2 from baseline 0.7. See below for further details.  A&P:  Acute metabolic encephalopathy H/o Seizures  History of glioblastoma s/p resection Failure to thrive Goals of care discussion - Continue AEDs with Vimpat  and Keppra .   - Continue dexamethasone . - have treated infection, nutrition, empiric tx for HSV with steroid/valtrex  and patient having no improvement; also s/p EEG/LTM with no active seizure activity - Given aggressive treatments and extensive workup dating back to  January (but much further still) along with underlying poor prognosis and functional decline, have discussed persistent decline with her husband.  She has not improved with maximal efforts and appears to be more appropriate for transitioning to hospice at this time; I have relayed this recommendation to him.  Also discussed case with neurology on 11/18/2023 and there is no further need for EEG monitoring; I do not think neuoro-onc will have much to offer - discussed at length with daughter on phone also on 5/8 - transitioned to comfort care after [facility] and family meeting - leave cortrak and PICC in place for meds, but no further TF at discharge; may have pleasure feeding  Septic shock, secondary to UTI - resolved Patient initially received vasopressors.  Foley catheter was changed.  S/p vancomycin  and meropenem  due to history of ESBL.  Urine culture with no growth.  Blood cultures negative  AKI on CKD - resolved   Acute hypoxic respiratory failure Currently on room air   Sacral decub, unstageable present on admission - not a good surgical candidate    Severe Calorie malnutrition, present on admission Refeeding syndrome - s/p cortrak tube and tube feeding - After family discussion, no further tube feeds at discharge   Anemia of chronic disease Thrombocytopenia Received 1 unit of packed RBC.  Significant thrombocytopenia.   Left toe swelling Secondary to chronic gout.   Hypothyroidism.  Continue Synthroid   Sacral decub, left unstageable present on admission, right buttocks stage II ulceration present on admission Pressure Injury 10/20/23 Buttocks Right Stage 2 -  Partial thickness loss  of dermis presenting as a shallow open injury with a red, pink wound bed without slough. the area is pink in the right buttocks (Active)  10/20/23 1500  Location: Buttocks  Location Orientation: Right  Staging: Stage 2 -  Partial thickness loss of dermis presenting as a shallow open injury with a red,  pink wound bed without slough.  Wound Description (Comments): the area is pink in the right buttocks  Present on Admission: Yes     Pressure Injury 11/06/23 Buttocks Left Unstageable - Full thickness tissue loss in which the base of the injury is covered by slough (yellow, tan, gray, green or brown) and/or eschar (tan, brown or black) in the wound bed. Full tissue loss in which base  (Active)  11/06/23 0430  Location: Buttocks  Location Orientation: Left  Staging: Unstageable - Full thickness tissue loss in which the base of the injury is covered by slough (yellow, tan, gray, green or brown) and/or eschar (tan, brown or black) in the wound bed.  Wound Description (Comments): Full tissue loss in which base of injury covered by slough  Present on Admission: Yes   Wound care recommending surgical intervention and possible debridement in a nonurgent basis.   Hypokalemia.  Repleted   Hypomagnesemia.  Repleted   Class I obesity.Body mass index is 33.52 kg/m.  Nutrition on board.  On tube feeding at this time.   Concern for Herpes Zoster s/p Valtrex  1000 g 3 times daily for 7 days. Will d/c airborne precautions if no more rash; left thigh has crusted over   Principal Diagnosis: Septic shock [facility])  Discharge Diagnoses: Active Hospital Problems   Diagnosis Date Noted   Goals of care, counseling/discussion 11/18/2023    Priority: 2.   Acute metabolic encephalopathy 11/18/2023   Protein-calorie malnutrition, severe 11/07/2023   Hypokalemia 08/12/2023   Hypomagnesemia 08/12/2023   Seizure disorder (HCC) 08/07/2023   Glioblastoma (HCC) 08/06/2023   Aphasia 05/12/2023   Hypertension 09/22/2017   Diabetes mellitus type 2, uncomplicated (HCC) 09/22/2017   Hypothyroidism 09/22/2017    Resolved Hospital Problems   Diagnosis Date Noted Date Resolved   Septic shock [facility]) 11/05/2023 11/18/2023     Discharge Instructions     Discharge wound care:   Complete by: As directed    Cleanse  sacral wound with NS, apply Medihoney to wound bed daily, cover with dry gauze and silicone foam or ABD pad and tape whichever is preferred.      Allergies as of 11/20/2023       Reactions   Celebrex [celecoxib] Nausea Only   Codeine Nausea Only        Medication List     STOP taking these medications    bisoprolol  10 MG tablet Commonly known as: ZEBETA    CALCIUM  600+D PO   cetirizine 10 MG tablet Commonly known as: ZYRTEC   Cholecalciferol  25 MCG (1000 UT) tablet   cyanocobalamin  1000 MCG tablet   feeding supplement (PRO-STAT SUGAR FREE 64) Liqd   levothyroxine  137 MCG tablet Commonly known as: SYNTHROID    lisinopril  40 MG tablet Commonly known as: ZESTRIL    LORazepam  1 MG tablet Commonly known as: ATIVAN    lovastatin 40 MG tablet Commonly known as: MEVACOR   metroNIDAZOLE  500 MG tablet Commonly known as: FLAGYL    mirtazapine  7.5 MG tablet Commonly known as: REMERON    multivitamin with minerals Tabs tablet   omeprazole 20 MG capsule Commonly known as: PRILOSEC   polyethylene glycol 17 g packet Commonly known as: MIRALAX  /  GLYCOLAX    predniSONE  20 MG tablet Commonly known as: DELTASONE    senna-docusate 8.6-50 MG tablet Commonly known as: Senokot-S   sulfamethoxazole-trimethoprim 800-160 MG tablet Commonly known as: BACTRIM DS       TAKE these medications    cloBAZam  10 MG tablet Commonly known as: ONFI  Place 1 tablet (10 mg total) into feeding tube 2 (two) times daily. What changed: how to take this   dexamethasone  1.5 MG tablet Commonly known as: DECADRON  Place 2 tablets (3 mg total) into feeding tube daily. Start taking on: Nov 21, 2023 What changed: how to take this   fluconazole  40 MG/ML suspension Commonly known as: DIFLUCAN  Place 5 mLs (200 mg total) into feeding tube daily for 3 days. Start taking on: Nov 21, 2023   lacosamide  200 MG Tabs tablet Commonly known as: VIMPAT  Place 1 tablet (200 mg total) into feeding tube 2  (two) times daily. What changed: how to take this   levETIRAcetam  1000 MG tablet Commonly known as: KEPPRA  Place 1 tablet (1,000 mg total) into feeding tube 2 (two) times daily. What changed:  medication strength how much to take how to take this   midodrine  5 MG tablet Commonly known as: PROAMATINE  Place 1 tablet (5 mg total) into feeding tube 2 (two) times daily with a meal.   mouth rinse Liqd solution 15 mLs by Mouth Rinse route as needed (oral care).   white petrolatum Oint Commonly known as: VASELINE Apply 1 Application topically as needed for lip care (.).               Discharge Care Instructions  (From admission, onward)           Start     Ordered   11/20/23 0000  Discharge wound care:       Comments: Cleanse sacral wound with NS, apply Medihoney to wound bed daily, cover with dry gauze and silicone foam or ABD pad and tape whichever is preferred.   11/20/23 1450            Allergies  Allergen Reactions   Celebrex [Celecoxib] Nausea Only   Codeine Nausea Only    Consultations: Palliative care  Procedures:   Discharge Exam: BP 127/84 (BP Location: Left Arm)   Pulse 94   Temp 98.8 F (37.1 C) (Oral)   Resp 16   Ht 5\' 7"  (1.702 m)   Wt 96.6 kg   SpO2 97%   BMI 33.36 kg/m  Physical Exam Constitutional:      Comments: Chronically ill-appearing elderly woman lying in bed appearing uncomfortable.  Unable to speak or interact meaningfully  HENT:     Head: Normocephalic and atraumatic.     Mouth/Throat:     Mouth: Mucous membranes are moist.  Eyes:     Pupils: Pupils are equal, round, and reactive to light.  Cardiovascular:     Rate and Rhythm: Normal rate and regular rhythm.  Pulmonary:     Effort: Pulmonary effort is normal. No respiratory distress.     Breath sounds: Normal breath sounds. No wheezing.  Abdominal:     General: Bowel sounds are normal. There is no distension.     Palpations: Abdomen is soft.     Tenderness:  There is no abdominal tenderness.  Musculoskeletal:        General: No swelling.     Cervical back: Normal range of motion and neck supple.  Skin:    General: Skin is warm and dry.  Neurological:  Comments: Did not follow any commands for me today      The results of significant diagnostics from this hospitalization (including imaging, microbiology, ancillary and laboratory) are listed below for reference.   Microbiology: No results found for this or any previous visit (from the past 240 hours).   Labs: BNP (last 3 results) No results for input(s): "BNP" in the last 8760 hours. Basic Metabolic Panel: Recent Labs  Lab 11/14/23 0420 11/15/23 0412 11/16/23 0830 11/17/23 0500 11/18/23 0450  NA 137 138 143 139 139  K 3.8 4.2 3.2* 3.7 3.8  CL 101 103 110 102 102  CO2 28 28 26 28 27   GLUCOSE 142* 191* 117* 119* 185*  BUN 41* 43* 34* 37* 39*  CREATININE 0.64 0.63 0.45 0.52 0.60  CALCIUM  9.4 9.1 8.3* 9.3 9.4  MG 1.8 1.6* 1.5* 1.9 1.8   Liver Function Tests: No results for input(s): "AST", "ALT", "ALKPHOS", "BILITOT", "PROT", "ALBUMIN" in the last 168 hours. No results for input(s): "LIPASE", "AMYLASE" in the last 168 hours. No results for input(s): "AMMONIA" in the last 168 hours. CBC: Recent Labs  Lab 11/14/23 0420 11/15/23 0412 11/17/23 0500 11/18/23 0450  WBC 4.6 4.5 4.9 5.8  HGB 7.4* 7.2* 7.7* 8.0*  HCT 23.5* 22.1* 23.8* 24.1*  MCV 106.8* 106.3* 106.3* 106.2*  PLT 36* 35* 40* 41*   Cardiac Enzymes: No results for input(s): "CKTOTAL", "CKMB", "CKMBINDEX", "TROPONINI" in the last 168 hours. BNP: Invalid input(s): "POCBNP" CBG: Recent Labs  Lab 11/19/23 1934 11/19/23 2314 11/20/23 0424 11/20/23 0804 11/20/23 1210  GLUCAP 270* 214* 137* 197* 150*   D-Dimer No results for input(s): "DDIMER" in the last 72 hours. Hgb A1c No results for input(s): "HGBA1C" in the last 72 hours. Lipid Profile No results for input(s): "CHOL", "HDL", "LDLCALC", "TRIG",  "CHOLHDL", "LDLDIRECT" in the last 72 hours. Thyroid function studies No results for input(s): "TSH", "T4TOTAL", "T3FREE", "THYROIDAB" in the last 72 hours.  Invalid input(s): "FREET3" Anemia work up No results for input(s): "VITAMINB12", "FOLATE", "FERRITIN", "TIBC", "IRON", "RETICCTPCT" in the last 72 hours. Urinalysis    Component Value Date/Time   COLORURINE YELLOW 11/05/2023 1633   APPEARANCEUR CLOUDY (A) 11/05/2023 1633   LABSPEC >=1.030 11/05/2023 1633   PHURINE 5.0 11/05/2023 1633   GLUCOSEU NEGATIVE 11/05/2023 1633   HGBUR LARGE (A) 11/05/2023 1633   BILIRUBINUR LARGE (A) 11/05/2023 1633   KETONESUR TRACE (A) 11/05/2023 1633   PROTEINUR >=300 (A) 11/05/2023 1633   NITRITE POSITIVE (A) 11/05/2023 1633   LEUKOCYTESUR MODERATE (A) 11/05/2023 1633   Sepsis Labs Recent Labs  Lab 11/14/23 0420 11/15/23 0412 11/17/23 0500 11/18/23 0450  WBC 4.6 4.5 4.9 5.8   Microbiology No results found for this or any previous visit (from the past 240 hours).  Procedures/Studies: CT HEAD WO CONTRAST ( ) Result Date: 11/09/2023 CLINICAL DATA:  Altered mental status.  History of glioblastoma. EXAM: CT HEAD WITHOUT CONTRAST TECHNIQUE: Contiguous axial images were obtained from the base of the skull through the vertex without intravenous contrast. RADIATION DOSE REDUCTION: This exam was performed according to the departmental dose-optimization program which includes automated exposure control, adjustment of the mA and/or kV according to patient size and/or use of iterative reconstruction technique. COMPARISON:  Head CT 11/05/2023 FINDINGS: Brain: A resection cavity and mild surrounding low-density in the left temporal lobe are unchanged. No acute infarct, intracranial hemorrhage, midline shift, or extra-axial fluid collection is identified. Patchy hypodensities elsewhere in the cerebral white matter bilaterally are unchanged and nonspecific but compatible  with mild chronic small vessel ischemic  disease. 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 There is mild cerebral atrophy. Vascular: No hyperdense vessel. Skull: Left temporoparietal craniotomy. Sinuses/Orbits: Minimal fluid or secretions in the right sphenoid sinus. Clear mastoid air cells. Unremarkable orbits. Other: None. IMPRESSION: 1. No evidence of acute intracranial abnormality. 2. Postoperative changes in the left temporal lobe. 3. Mild chronic small vessel ischemic disease. Electronically Signed   By: Aundra Lee M.D.   On: 11/09/2023 18:51   EEG adult Result Date: 11/09/2023 Arleene Lack, MD     11/09/2023  4:00 PM Patient Name: Dana Morton MRN: [id number] Epilepsy Attending: Arleene Lack Referring Physician/Provider: Rosena Conradi, MD Date: 11/09/2023 Duration: 24.30 mins Patient history: 73yo F with h/o GBP s/p resection now with ams. EEG to evaluate with seizure.  Level of alertness: awake/ lethargic  AEDs during EEG study: LEV, LCM, Onfi   Technical aspects: This EEG study was done with scalp electrodes positioned according to the 10-20 International system of electrode placement. Electrical activity was reviewed with band pass filter of 1-70Hz , sensitivity of 7 uV/mm, display speed of 6mm/sec with a 60Hz  notched filter applied as appropriate. EEG data were recorded continuously and digitally stored.  Video monitoring was available and reviewed as appropriate.  Description: No clear posterior dominant rhythm was seen. EEG showed continuous generalized and maximal left temporal 5 to 6 Hz theta slowing admixed with intermittent 2-3hz  delta slowing. Sharp waves were noted in left temporo-parietal region. Hyperventilation and photic stimulation were not performed.    ABNORMALITY - Sharp waves, left temporo-parietal region. 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  Additionally there is cortical dysfunction arising from left temporal region, likely secondary to underlying structural abnormality. Lastly there is mild to moderate diffuse encephalopathy. No seizures were seen throughout the recording.  Arleene Lack   DG Foot 2 Views Left Result Date: 11/07/2023 CLINICAL DATA:  Foot pain and swelling EXAM: LEFT FOOT - 2 VIEW COMPARISON:  None Available. FINDINGS: Calcaneal spurring and tarsal degenerative changes are noted. No acute fracture or dislocation is noted. No rows of changes are seen. IMPRESSION: Degenerative change without acute abnormality. Electronically Signed   By: Violeta Grey M.D.   On: 11/07/2023 14:30   DG Chest Port 1 View Result Date: 11/07/2023 CLINICAL DATA:  5626 Acute respiratory failure (HCC) 5626 EXAM: PORTABLE CHEST - 1 VIEW COMPARISON:  11/05/2023 FINDINGS: Feeding tube has been placed least as far as the stomach, tip not seen. Right arm PICC line to the mid right atrium. Minimal linear opacities at the left lung base. Right lung clear. Heart size and mediastinal contours are within normal limits. No effusion. Mild blunting of the left lateral costophrenic angle. IMPRESSION: Minimal left basilar atelectasis. Electronically Signed   By: Nicoletta Barrier M.D.   On: 11/07/2023 09:22   Overnight EEG with video Result Date: 11/07/2023 Arleene Lack, MD     11/08/2023  6:55 AM Patient Name: KIMBERLE PATALANO MRN: [id number] Epilepsy Attending: Arleene Lack Referring Physician/Provider: Ogan, Okoronkwo U, MD Duration: 11/06/2023 1202 to 11/07/2023 1202  Patient history: 73yo F with h/o GBP s/p resection now with ams. EEG to evaluate with seizure.  Level of alertness: comatose/lethargic  AEDs during EEG study: LEV, LCM, Onfi   Technical aspects: This EEG study was done with scalp electrodes positioned according to the 10-20  International system of electrode placement. Electrical activity was reviewed with band pass filter of 1-70Hz , sensitivity of 7  uV/mm, display speed of 2mm/sec with a 60Hz  notched filter applied as appropriate. EEG data were recorded continuously and digitally stored.  Video monitoring was available and reviewed as appropriate.  Description: EEG showed continuous generalized and maximal left temporal 3 to 6 Hz theta-delta slowing. Sharp waves were noted in left temporo-parietal region. Hyperventilation and photic stimulation were not performed.    ABNORMALITY - Sharp waves, left temporo-parietal region. 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 Additionally there is cortical dysfunction arising from left temporal region, likely secondary to underlying structural abnormality. Lastly there is moderate diffuse encephalopathy. No seizures were seen throughout the recording.  Priyanka Suzanne Erps   CT ABDOMEN PELVIS WO CONTRAST Result Date: 11/05/2023 CLINICAL DATA:  Sepsis altered EXAM: CT ABDOMEN AND PELVIS WITHOUT CONTRAST TECHNIQUE: Multidetector CT imaging of the abdomen and pelvis was performed following the standard protocol without IV contrast. RADIATION DOSE REDUCTION: This exam was performed according to the departmental dose-optimization program which includes automated exposure control, adjustment of the mA and/or kV according to patient size and/or use of iterative reconstruction technique. COMPARISON:  None Available. FINDINGS: Lower chest: Lung bases demonstrate mild dependent atelectasis. Vascular catheter tip in the low right atrium Hepatobiliary: No focal liver abnormality is seen. Status post cholecystectomy. No biliary dilatation. Pancreas: Unremarkable. No pancreatic ductal dilatation or surrounding inflammatory changes. Spleen: Normal in size without focal abnormality. Adrenals/Urinary Tract: Adrenal glands are normal. Kidneys show no hydronephrosis. 4 mm stone in the right kidney. The bladder is  decompressed by Foley catheter. Some perivesical stranding. Stomach/Bowel: Stomach nonenlarged. No dilated small bowel. No acute bowel wall thickening. Diverticular disease of the sigmoid colon. Moderate rectal distension by feces. Vascular/Lymphatic: Aortic atherosclerosis. No enlarged abdominal or pelvic lymph nodes. Reproductive: Hysterectomy.  No adnexal mass Other: Negative for pelvic effusion or free air. Small fat containing umbilical hernia. Anterior abdominal wall laxity with bulging fat. Musculoskeletal: No acute or suspicious osseous abnormality IMPRESSION: 1. The bladder is decompressed by Foley catheter. There is some perivesical stranding which could be due to cystitis. 2. Nonobstructing right kidney stone. 3. Diverticular disease of the sigmoid colon without acute inflammatory process. Moderate rectal distension by feces. 4. Aortic atherosclerosis. Aortic Atherosclerosis (ICD10-I70.0). Electronically Signed   By: Esmeralda Hedge M.D.   On: 11/05/2023 21:29   CT Head Wo Contrast Result Date: 11/05/2023 CLINICAL DATA:  Mental status change, unknown cause EXAM: CT HEAD WITHOUT CONTRAST TECHNIQUE: Contiguous axial images were obtained from the base of the skull through the vertex without intravenous contrast. RADIATION DOSE REDUCTION: This exam was performed according to the departmental dose-optimization program which includes automated exposure control, adjustment of the mA and/or kV according to patient size and/or use of iterative reconstruction technique. COMPARISON:  CT head 10/20/2023 FINDINGS: Brain: Patchy and confluent areas of decreased attenuation are noted throughout the deep and periventricular white matter of the cerebral hemispheres bilaterally, compatible with chronic microvascular ischemic disease. Similar-appearing left temporoparietal lobe encephalomalacia. No evidence of large-territorial acute infarction. No parenchymal hemorrhage. No mass lesion. No extra-axial collection. No mass  effect or midline shift. No hydrocephalus. Basilar cisterns are patent. Vascular: No hyperdense vessel. Atherosclerotic calcifications are present within the cavernous internal carotid and vertebral arteries. Skull: No acute fracture or focal lesion. Sinuses/Orbits: Right sphenoid sinus mucosal thickening. Otherwise paranasal sinuses  and mastoid air cells are clear. The orbits are unremarkable. Other: None. IMPRESSION: No acute intracranial abnormality. Electronically Signed   By: Morgane  Naveau M.D.   On: 11/05/2023 21:27   DG Chest Port 1 View Result Date: 11/05/2023 CLINICAL DATA:  Possible sepsis EXAM: PORTABLE CHEST 1 VIEW COMPARISON:  10/20/2023 FINDINGS: Low lung volumes. No focal airspace disease, pleural effusion or pneumothorax. Right upper extremity central venous catheter with the tip projecting at the right atrial IVC junction. Minimal atelectasis left base. IMPRESSION: Low lung volumes with minimal atelectasis at the left base. New right upper extremity central venous catheter with the tip projecting at the level of the right atrial IVC junction Electronically Signed   By: Esmeralda Hedge M.D.   On: 11/05/2023 17:50   EEG adult Result Date: 10/22/2023 Arleene Lack, MD     10/22/2023  9:07 AM Patient Name: Dana Morton MRN: [id number] Epilepsy Attending: Arleene Lack Referring Physician/Provider: Jayson Michael, MD Date: 10/21/2023 Duration: 22.28 mins Patient history: 73yo F with h/o GBP s/p resection now with ams. EEG to evaluate with seizure. Level of alertness: Awake AEDs during EEG study: LEV, LCM Technical aspects: This EEG study was done with scalp electrodes positioned according to the 10-20 International system of electrode placement. Electrical activity was reviewed with band pass filter of 1-70Hz , sensitivity of 7 uV/mm, display speed of 12mm/sec with a 60Hz  notched filter applied as appropriate. EEG data were recorded continuously and digitally stored.  Video monitoring was  available and reviewed as appropriate. Description: The posterior dominant rhythm consists of 8 Hz activity of moderate voltage (25-35 uV) seen predominantly in posterior head regions, symmetric and reactive to eye opening and eye closing. EEG showed continuous generalized and maximal left temporal 3 to 6 Hz theta-delta slowing. Physiologic photic driving was not seen during photic stimulation.  Hyperventilation was not performed.   ABNORMALITY - Continuous slow, generalized and maximal left temporal IMPRESSION: This study is suggestive of cortical dysfunction arising from left temporal region, likely secondary to underlying structural abnormality. Additionally there is mild to moderate diffuse encephalopathy. No seizures or epileptiform discharges were seen throughout the recording. Arleene Lack     Time coordinating discharge: Over 30 minutes    Faith Homes, MD  [facility] 11/20/2023, 2:51 PM

## 2023-11-20 NOTE — Progress Notes (Signed)
   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 She was d/c to facility 4/14 from [facility] after being treated for sepsis, UTI and she had a stage III sacral decubiti. She has a chronic Foley.    Palliative care has been asked to support additional goals of care conversations.   Today's Discussion 11/20/2023  *Please note that this is a verbal dictation therefore any spelling or grammatical errors are due to the "Dragon Medical One" system interpretation.  Chart reviewed inclusive of vital signs, progress notes, laboratory results, and diagnostic images. She is resting comfortably with soft mittens in place. No family present during my visit. Per MAR, no PRN morphine  or ativan  in the past 24 hours.  Called patient's husband [name] for ongoing palliative support. He shares that he stayed with patient for four hours yesterday and she slept through most of the visit. He was not able to speak with her. Emotional support and therapeutic listening was provided.  [name] confirms plan for discharge to hospice facility when a bed becomes available. He likely will not visit at the bedside today. No other needs at this time.  Questions and concerns addressed/Palliative Support Provided.   Objective Assessment: Vital Signs Vitals:   11/20/23 0428 11/20/23 0807  BP: 106/63 117/71  Pulse: 84 94  Resp: 16 15  Temp: 98.7 F (37.1 C) 98 F (36.7 C)  SpO2: 98% 96%    Gen:  Elderly Caucasian F chronically ill appearing HEENT: Coretrack, Dry mucous membranes CV: Regular rate and rhythm  PULM:  On RA, breathing is even and nonlabored ABD: soft/nontender/nondistended  EXT: (+) edema  Neuro:  sleeping  SUMMARY OF RECOMMENDATIONS   DNAR/DNI escalation of care to the ICU for pressors/antiarrhythmics/bipap if needed   Patient is stable for transfer to Hospice home in [place], await bed availability  Family would like to continue all interventions including coretrack until prior to transition to hospice home, at which point all goals would focus on comfort care  Psychosocial and emotional support provided  Ongoing PMT support   Time: 35 ______________________________________________________________________________________ Audie Leander PA-C [place] Palliative Medicine Team Team Cell Phone: [phone] Please utilize secure chat with additional questions, if there is no response within 30 minutes please call the above phone number  Palliative Medicine Team providers are available by phone from 7am to 7pm daily and can be reached through the team cell phone.  Should this patient require assistance outside of these hours, please call the patient's attending physician.

## 2023-11-21 DIAGNOSIS — A419 Sepsis, unspecified organism: Secondary | ICD-10-CM | POA: Diagnosis not present

## 2023-11-21 DIAGNOSIS — G9341 Metabolic encephalopathy: Secondary | ICD-10-CM | POA: Diagnosis not present

## 2023-11-21 DIAGNOSIS — G40909 Epilepsy, unspecified, not intractable, without status epilepticus: Secondary | ICD-10-CM

## 2023-11-21 DIAGNOSIS — R4701 Aphasia: Secondary | ICD-10-CM | POA: Diagnosis not present

## 2023-11-21 DIAGNOSIS — Z7189 Other specified counseling: Secondary | ICD-10-CM | POA: Diagnosis not present

## 2023-11-21 LAB — GLUCOSE, CAPILLARY
Glucose-Capillary: 164 mg/dL — ABNORMAL HIGH (ref 70–99)
Glucose-Capillary: 188 mg/dL — ABNORMAL HIGH (ref 70–99)
Glucose-Capillary: 217 mg/dL — ABNORMAL HIGH (ref 70–99)
Glucose-Capillary: 232 mg/dL — ABNORMAL HIGH (ref 70–99)
Glucose-Capillary: 235 mg/dL — ABNORMAL HIGH (ref 70–99)
Glucose-Capillary: 281 mg/dL — ABNORMAL HIGH (ref 70–99)

## 2023-11-21 NOTE — Plan of Care (Signed)
  Problem: Coping: Goal: Ability to adjust to condition or change in health will improve Outcome: Progressing   Problem: Education: Goal: Individualized Educational Video(s) Outcome: Progressing

## 2023-11-21 NOTE — Progress Notes (Signed)
 Progress Note    Dana Morton   ZOX:[id number]  DOB: 03/15/1951  DOA: 11/05/2023     16 PCP: Melchor Spoon, MD  Initial CC: Fall at nursing [facility] Course: Dana Morton is a 73 y/o female with with past medical history of glioblastoma s/p resection 05/2023 at [facility], Seizure disorder secondary to tumor/radiation , HTN, Hypothyroidism presented to hospital via skilled nursing facility after sustaining a fall.   Patient was recently discharged from rehab facility on 4/14.  Patient was not taking her medications and food at home and had been bedbound.  According to husband, who was at the beside, she has been refusing her food and meds for several weeks.  In the ED patient was hypotensive and required vasopressors.  Lactic acid was 2.2 initially.  Creatinine was 3.2 from baseline 0.7. See below for further details.  A&P:  Acute metabolic encephalopathy H/o Seizures  History of glioblastoma s/p resection Failure to thrive Goals of care discussion - Continue Vimpat  and Keppra  - Continue dexamethasone  - have treated infection, nutrition, empiric tx for HSV with steroid/valtrex  and patient having no improvement; also s/p EEG/LTM with no active seizure activity - Given aggressive treatments and extensive workup dating back to January (but much further still) along with underlying poor prognosis and functional decline, have discussed persistent decline with her husband.  She has not improved with maximal efforts and appears to be more appropriate for transitioning to hospice at this time; I have relayed this recommendation to him.  Also discussed case with neurology on 11/18/2023 and there is no further need for EEG monitoring; I do not think neuoro-onc will have much to offer - discussed at length with daughter on phone also on 5/8 - transitioned to comfort care after [facility] and family meeting - still no meaningful improvement on several days TF and no change in mentation; still agree  with pursuit of hospice and de-escalation of care with focus on comfort   Septic shock, secondary to UTI - resolved Patient initially received vasopressors.  Foley catheter was changed.  S/p vancomycin  and meropenem  due to history of ESBL.  Urine culture with no growth.  Blood cultures negative  AKI on CKD - resolved   Acute hypoxic respiratory failure Currently on room air   Sacral decub, unstageable present on admission - not a good surgical candidate    Severe Calorie malnutrition, present on admission Refeeding syndrome - s/p cortrak tube and tube feeding - After family discussion, no further tube feeds at discharge   Anemia of chronic disease Thrombocytopenia Received 1 unit of packed RBC.  Significant thrombocytopenia   Left toe swelling Secondary to chronic gout  Hypothyroidism.  Continue Synthroid   Sacral decub, left unstageable present on admission, right buttocks stage II ulceration present on admission Pressure Injury 10/20/23 Buttocks Right Stage 2 -  Partial thickness loss of dermis presenting as a shallow open injury with a red, pink wound bed without slough. the area is pink in the right buttocks (Active)  10/20/23 1500  Location: Buttocks  Location Orientation: Right  Staging: Stage 2 -  Partial thickness loss of dermis presenting as a shallow open injury with a red, pink wound bed without slough.  Wound Description (Comments): the area is pink in the right buttocks  Present on Admission: Yes     Pressure Injury 11/06/23 Buttocks Left Unstageable - Full thickness tissue loss in which the base of the injury is covered by slough (yellow, tan, gray, green or  brown) and/or eschar (tan, brown or black) in the wound bed. Full tissue loss in which base  (Active)  11/06/23 0430  Location: Buttocks  Location Orientation: Left  Staging: Unstageable - Full thickness tissue loss in which the base of the injury is covered by slough (yellow, tan, gray, green or brown)  and/or eschar (tan, brown or black) in the wound bed.  Wound Description (Comments): Full tissue loss in which base of injury covered by slough  Present on Admission: Yes   Wound care recommending surgical intervention and possible debridement in a nonurgent basis.   Hypokalemia.  Repleted   Hypomagnesemia.  Repleted   Class I obesity.Body mass index is 33.52 kg/m.  Nutrition on board.  On tube feeding at this time.   Concern for Herpes Zoster s/p Valtrex  1000 g 3 times daily for 7 days. Will d/c airborne precautions if no more rash; left thigh has crusted over  Interval History:  Remains clinically the same for me today.  Babbles to open-ended questions.  I have not seen any meaningful change or improvement in her mentation.  Not sure how she may have communicated what was reported to her granddaughter yesterday as she cannot say anything to me beyond her name and inappropriately will say yes or no to certain questions.   Old records reviewed in assessment of this patient  Antimicrobials:   DVT prophylaxis:  SCDs Start: 11/05/23 2151   Code Status:   Code Status: Limited: Do not attempt resuscitation (DNR) -DNR-LIMITED -Do Not Intubate/DNI   Mobility Assessment (Last 72 Hours)     Mobility Assessment     Row Name 11/20/23 2026 11/20/23 0751 11/19/23 2030 11/19/23 0733 11/18/23 2100   Does patient have an order for bedrest or is patient medically unstable No - Continue assessment No - Continue assessment No - Continue assessment No - Continue assessment No - Continue assessment   What is the highest level of mobility based on the progressive mobility assessment? Level 1 (Bedfast) - Unable to balance while sitting on edge of bed Level 1 (Bedfast) - Unable to balance while sitting on edge of bed Level 1 (Bedfast) - Unable to balance while sitting on edge of bed Level 1 (Bedfast) - Unable to balance while sitting on edge of bed Level 1 (Bedfast) - Unable to balance while sitting on  edge of bed   Is the above level different from baseline mobility prior to current illness? -- Yes - Recommend PT order -- Yes - Recommend PT order --            Barriers to discharge:  Disposition Plan:  TBD, possible hospice Status is: Inpt  Objective: Blood pressure 126/64, pulse 92, temperature 98.9 F (37.2 C), temperature source Axillary, resp. rate 20, height 5\' 7"  (1.702 m), weight 96.6 kg, SpO2 99%.  Examination:  Physical Exam Constitutional:      Comments: Chronically ill-appearing elderly woman lying in bed appearing uncomfortable.  Unable to speak or interact meaningfully  HENT:     Head: Normocephalic and atraumatic.     Mouth/Throat:     Mouth: Mucous membranes are moist.  Eyes:     Pupils: Pupils are equal, round, and reactive to light.  Cardiovascular:     Rate and Rhythm: Normal rate and regular rhythm.  Pulmonary:     Effort: Pulmonary effort is normal. No respiratory distress.     Breath sounds: Normal breath sounds. No wheezing.  Abdominal:     General: Bowel sounds  are normal. There is no distension.     Palpations: Abdomen is soft.     Tenderness: There is no abdominal tenderness.  Musculoskeletal:        General: No swelling.     Cervical back: Normal range of motion and neck supple.  Skin:    General: Skin is warm and dry.  Neurological:     Comments: Did not follow any commands for me today      Consultants:  Palliative care   Procedures:    Data Reviewed: Results for orders placed or performed during the hospital encounter of 11/05/23 (from the past 24 hours)  Glucose, capillary     Status: Abnormal   Collection Time: 11/20/23  4:23 PM  Result Value Ref Range   Glucose-Capillary 224 (H) 70 - 99 mg/dL  Glucose, capillary     Status: Abnormal   Collection Time: 11/20/23  7:43 PM  Result Value Ref Range   Glucose-Capillary 290 (H) 70 - 99 mg/dL   Comment 1 Notify RN    Comment 2 Document in Chart   Glucose, capillary     Status:  Abnormal   Collection Time: 11/20/23 11:47 PM  Result Value Ref Range   Glucose-Capillary 200 (H) 70 - 99 mg/dL   Comment 1 Notify RN    Comment 2 Document in Chart   Glucose, capillary     Status: Abnormal   Collection Time: 11/21/23  4:11 AM  Result Value Ref Range   Glucose-Capillary 235 (H) 70 - 99 mg/dL   Comment 1 Notify RN    Comment 2 Document in Chart   Glucose, capillary     Status: Abnormal   Collection Time: 11/21/23  7:52 AM  Result Value Ref Range   Glucose-Capillary 217 (H) 70 - 99 mg/dL  Glucose, capillary     Status: Abnormal   Collection Time: 11/21/23 12:22 PM  Result Value Ref Range   Glucose-Capillary 164 (H) 70 - 99 mg/dL    I have reviewed pertinent nursing notes, vitals, labs, and images as necessary. I have ordered labwork to follow up on as indicated.  I have reviewed the last notes from staff over past 24 hours. I have discussed patient's care plan and test results with nursing staff, CM/SW, and other staff as appropriate.  Time spent: Greater than 50% of the 55 minute visit was spent in counseling/coordination of care for the patient as laid out in the A&P.   LOS: 16 days   Faith Homes, MD [facility] 11/21/2023, 2:14 PM

## 2023-11-21 NOTE — Progress Notes (Signed)
 Arlin Benes [phone]  [facility] Liaison Note  Referral received for family interest in Hospice Home.   Hospice Liaison explained services and hospice philosophy to Mr. Evers and all questions answered.   Hospice Home is able to accept patient this afternoon once consents are complete.   RN staff, you may call report at any time to [facility] @ [phone], room is assigned when report is called.   Please leave IV intact and send completed DNR with patient.   Updated attending and [facility] manager via [employer].  Thank you for the opportunity to participate in this patient's care   Jacqlyn Matas, BSN, RN Hospice Nurse Liaison [phone]

## 2023-11-21 NOTE — Progress Notes (Signed)
  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 She was d/c to facility 4/14 from [facility] after being treated for sepsis, UTI and she had a stage III sacral decubiti. She has a chronic Foley.    Palliative care has been asked to support additional goals of care conversations.   Today's Discussion 11/21/2023  *Please note that this is a verbal dictation therefore any spelling or grammatical errors are due to the "Dragon Medical One" system interpretation.  Chart reviewed inclusive of vital signs, progress notes, laboratory results, and diagnostic images. She is resting comfortably with soft mittens in place. No family present during my visit.  She did not arouse to loud voice after several attempts and I allowed her to continue resting.  Called patient's daughter Trevor Fudge for ongoing palliative support.  She is on her way to the hospital and still has reservations about discontinuing midodrine .  Made plans to meet at the bedside with MD as well at 3 PM.  Reviewed course of patient's illness, poor overall prognosis, lack of further treatment options, lack of benefit from midodrine , potential for continued complications and decline even in the best case scenario with improvement in mental status, and explored patient's thoughts and feelings as best as possible.  She now agrees to a transfer via ambulance and does not seem as disturbed as she was to go anywhere compared to when Stephanie's daughter was visiting yesterday.  Questions and concerns addressed/Palliative Support Provided.   Objective Assessment: Vital Signs Vitals:   11/21/23 0348 11/21/23 0753  BP: (!) 123/59 112/84  Pulse: 84  98  Resp: 18 19  Temp: 98.1 F (36.7 C) 99 F (37.2 C)  SpO2: 99% 98%    Gen:  Elderly Caucasian F chronically ill appearing HEENT: Coretrack, Dry mucous membranes CV: Regular rate and rhythm  PULM:  On RA, breathing is even and nonlabored ABD: soft/nontender/nondistended  EXT: (+) edema  Neuro: Restless  SUMMARY OF RECOMMENDATIONS   -DNAR/DNI   -Patient is stable for transfer to Hospice home in [place]. discussed with MD, LCSW, hospice liaison  -Psychosocial and emotional support provided  -Ongoing PMT support as needed  Time: 32 ______________________________________________________________________________________ Audie Leander PA-C [place] Palliative Medicine Team Team Cell Phone: [phone] Please utilize secure chat with additional questions, if there is no response within 30 minutes please call the above phone number  Palliative Medicine Team providers are available by phone from 7am to 7pm daily and can be reached through the team cell phone.  Should this patient require assistance outside of these hours, please call the patient's attending physician.

## 2023-11-21 NOTE — Progress Notes (Signed)
 Arlin Benes [phone]  [facility] Liaison Note    Patient will not discharge today. Patient's husband [name]) was not able to sign consents electronically for [facility] and will meet social worker tomorrow morning in person at [facility] to complete.    Updated attending and [facility] manager via [employer].   Thank you for the opportunity to participate in this patient's care     Jacqlyn Matas, BSN, RN Hospice Nurse Liaison [phone]

## 2023-11-21 NOTE — Discharge Summary (Signed)
 Physician Discharge Summary   Dana Morton WUJ:[id number] DOB: 03/25/1951 DOA: 11/05/2023  PCP: Melchor Spoon, MD  Admit date: 11/05/2023 Discharge date: 11/21/2023  Admitted From: Home Disposition:  Hospice house Discharging physician: Faith Homes, MD Barriers to discharge: none  Recommendations at discharge: Continue comfort care  Discharge Condition: poor CODE STATUS: DNR Diet recommendation:  Diet Orders (From admission, onward)     Start     Ordered   11/11/23 1523  Diet full liquid Room service appropriate? Yes with Assist; Fluid consistency: Thin  Diet effective now       Comments: Meds whole with puree; allow pt to feed herself with support  Question Answer Comment  Room service appropriate? 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   Patient was recently discharged from rehab facility on 4/14.  Patient was not taking her medications and food at home and had been bedbound.  According to husband, who was at the beside, she has been refusing her food and meds for several weeks.  In the ED patient was hypotensive and required vasopressors.  Lactic acid was 2.2 initially.  Creatinine was 3.2 from baseline 0.7. See below for further details.  A&P:  Acute metabolic encephalopathy H/o Seizures  History of glioblastoma s/p resection Failure to thrive Goals of care discussion - Continue Vimpat  and Keppra  - Continue dexamethasone  - have treated infection, nutrition, empiric tx for HSV with steroid/valtrex  and patient having no improvement; also s/p EEG/LTM with no active seizure activity - Given aggressive treatments and extensive workup dating back to January (but  much further still) along with underlying poor prognosis and functional decline, have discussed persistent decline with her husband.  She has not improved with maximal efforts and appears to be more appropriate for transitioning to hospice at this time; I have relayed this recommendation to him.  Also discussed case with neurology on 11/18/2023 and there is no further need for EEG monitoring; I do not think neuoro-onc will have much to offer - discussed at length with daughter on phone also on 5/8 - transitioned to comfort care after [facility] and family meeting - still no meaningful improvement on several days TF and no change in mentation; still agree with pursuit of hospice and de-escalation of care with focus on comfort  - Bedside discussion held with daughter with her husband on the phone on 11/21/2023; family now in agreement with transitioning to residential hospice  Septic shock, secondary to UTI - resolved Patient initially received vasopressors.  Foley catheter was changed.  S/p vancomycin  and meropenem  due to history of ESBL.  Urine culture with no growth.  Blood cultures negative  AKI on CKD - resolved   Acute hypoxic respiratory failure Currently on room air   Sacral decub, unstageable present on admission - not a good surgical candidate    Severe Calorie malnutrition, present on admission Refeeding syndrome - s/p cortrak tube and tube feeding - After family discussion, no further tube feeds at discharge   Anemia of chronic disease Thrombocytopenia Received 1 unit of packed RBC.  Significant thrombocytopenia   Left toe swelling Secondary to chronic gout  Hypothyroidism.  Continue Synthroid   Sacral decub, left unstageable present on admission, right buttocks stage II ulceration present on admission Pressure Injury 10/20/23 Buttocks Right Stage 2 -  Partial thickness loss of dermis presenting as a shallow open injury with a red, pink wound bed without slough. the area is pink in  the right buttocks (Active)  10/20/23 1500  Location: Buttocks  Location Orientation: Right  Staging: Stage 2 -  Partial thickness loss of dermis presenting as a shallow open injury with a red, pink wound bed without slough.  Wound Description (Comments): the area is pink in the right buttocks  Present on Admission: Yes     Pressure Injury 11/06/23 Buttocks Left Unstageable - Full thickness tissue loss in which the base of the injury is covered by slough (yellow, tan, gray, green or brown) and/or eschar (tan, brown or black) in the wound bed. Full tissue loss in which base  (Active)  11/06/23 0430  Location: Buttocks  Location Orientation: Left  Staging: Unstageable - Full thickness tissue loss in which the base of the injury is covered by slough (yellow, tan, gray, green or brown) and/or eschar (tan, brown or black) in the wound bed.  Wound Description (Comments): Full tissue loss in which base of injury covered by slough  Present on Admission: Yes   Wound care recommending surgical intervention and possible debridement in a nonurgent basis.   Hypokalemia.  Repleted   Hypomagnesemia.  Repleted   Class I obesity.Body mass index is 33.52 kg/m.  Nutrition on board.  On tube feeding at this time.   Concern for Herpes Zoster s/p Valtrex  1000 g 3 times daily for 7 days. Will d/c airborne precautions if no more rash; left thigh has crusted over   Principal Diagnosis: Septic shock [facility])  Discharge Diagnoses: Active Hospital Problems   Diagnosis Date Noted   Goals of care, counseling/discussion 11/18/2023    Priority: 2.   Acute metabolic encephalopathy 11/18/2023   Protein-calorie malnutrition, severe 11/07/2023   Hypokalemia 08/12/2023   Hypomagnesemia 08/12/2023   Seizure disorder (HCC) 08/07/2023   Glioblastoma (HCC) 08/06/2023   Aphasia 05/12/2023   Hypertension 09/22/2017   Diabetes mellitus type 2, uncomplicated (HCC) 09/22/2017   Hypothyroidism 09/22/2017    Resolved  Hospital Problems   Diagnosis Date Noted Date Resolved   Septic shock [facility]) 11/05/2023 11/18/2023     Discharge Instructions     Discharge wound care:   Complete by: As directed    Cleanse sacral wound with NS, apply Medihoney to wound bed daily, cover with dry gauze and silicone foam or ABD pad and tape whichever is preferred.   Discharge wound care:   Complete by: As directed    Cleanse sacral wound with NS, apply Medihoney to wound bed daily, cover with dry gauze and silicone foam or ABD pad and tape whichever is preferred.      Allergies as of 11/21/2023       Reactions   Celebrex [celecoxib] Nausea Only   Codeine Nausea Only        Medication List     STOP taking these medications    bisoprolol  10 MG tablet Commonly known as: ZEBETA    CALCIUM  600+D PO   cetirizine 10 MG tablet Commonly known as: ZYRTEC   Cholecalciferol  25 MCG (1000 UT) tablet   cyanocobalamin  1000 MCG tablet   feeding supplement (PRO-STAT SUGAR FREE 64) Liqd   levothyroxine  137 MCG tablet Commonly known as: SYNTHROID    lisinopril  40 MG tablet Commonly known as: ZESTRIL   LORazepam  1 MG tablet Commonly known as: ATIVAN    lovastatin 40 MG tablet Commonly known as: MEVACOR   metroNIDAZOLE  500 MG tablet Commonly known as: FLAGYL    mirtazapine  7.5 MG tablet Commonly known as: REMERON    multivitamin with minerals Tabs tablet   omeprazole 20 MG capsule Commonly known as: PRILOSEC   polyethylene glycol 17 g packet Commonly known as: MIRALAX  / GLYCOLAX    predniSONE  20 MG tablet Commonly known as: DELTASONE    senna-docusate 8.6-50 MG tablet Commonly known as: Senokot-S   sulfamethoxazole-trimethoprim 800-160 MG tablet Commonly known as: BACTRIM DS       TAKE these medications    cloBAZam  10 MG tablet Commonly known as: ONFI  Place 1 tablet (10 mg total) into feeding tube 2 (two) times daily. What changed: how to take this   dexamethasone  1.5 MG tablet Commonly known  as: DECADRON  Place 2 tablets (3 mg total) into feeding tube daily. What changed: how to take this   fluconazole  40 MG/ML suspension Commonly known as: DIFLUCAN  Place 5 mLs (200 mg total) into feeding tube daily for 3 days.   lacosamide  200 MG Tabs tablet Commonly known as: VIMPAT  Place 1 tablet (200 mg total) into feeding tube 2 (two) times daily. What changed: how to take this   levETIRAcetam  1000 MG tablet Commonly known as: KEPPRA  Place 1 tablet (1,000 mg total) into feeding tube 2 (two) times daily. What changed:  medication strength how much to take how to take this   mouth rinse Liqd solution 15 mLs by Mouth Rinse route as needed (oral care).   white petrolatum Oint Commonly known as: VASELINE Apply 1 Application topically as needed for lip care (.).               Discharge Care Instructions  (From admission, onward)           Start     Ordered   11/21/23 0000  Discharge wound care:       Comments: Cleanse sacral wound with NS, apply Medihoney to wound bed daily, cover with dry gauze and silicone foam or ABD pad and tape whichever is preferred.   11/21/23 1601   11/20/23 0000  Discharge wound care:       Comments: Cleanse sacral wound with NS, apply Medihoney to wound bed daily, cover with dry gauze and silicone foam or ABD pad and tape whichever is preferred.   11/20/23 1450            Allergies  Allergen Reactions   Celebrex [Celecoxib] Nausea Only   Codeine Nausea Only    Consultations: Palliative care  Procedures:   Discharge Exam: BP 126/64 (BP Location: Right Wrist)   Pulse 92   Temp 98.9 F (37.2 C) (Axillary)   Resp 20   Ht 5\' 7"  (1.702 m)   Wt 96.6 kg   SpO2 99%   BMI 33.36 kg/m  Physical Exam Constitutional:      Comments: Chronically ill-appearing elderly woman lying in bed appearing uncomfortable.  Unable to speak or interact meaningfully  HENT:     Head: Normocephalic and atraumatic.     Mouth/Throat:     Mouth:  Mucous membranes are moist.  Eyes:     Pupils: Pupils are equal, round, and reactive to light.  Cardiovascular:     Rate and Rhythm: Normal rate and regular rhythm.  Pulmonary:     Effort: Pulmonary effort is normal. No respiratory distress.     Breath sounds: Normal breath sounds.  No wheezing.  Abdominal:     General: Bowel sounds are normal. There is no distension.     Palpations: Abdomen is soft.     Tenderness: There is no abdominal tenderness.  Musculoskeletal:        General: No swelling.     Cervical back: Normal range of motion and neck supple.  Skin:    General: Skin is warm and dry.  Neurological:     Comments: Did not follow any commands for me today      The results of significant diagnostics from this hospitalization (including imaging, microbiology, ancillary and laboratory) are listed below for reference.   Microbiology: No results found for this or any previous visit (from the past 240 hours).   Labs: BNP (last 3 results) No results for input(s): "BNP" in the last 8760 hours. Basic Metabolic Panel: Recent Labs  Lab 11/15/23 0412 11/16/23 0830 11/17/23 0500 11/18/23 0450  NA 138 143 139 139  K 4.2 3.2* 3.7 3.8  CL 103 110 102 102  CO2 28 26 28 27   GLUCOSE 191* 117* 119* 185*  BUN 43* 34* 37* 39*  CREATININE 0.63 0.45 0.52 0.60  CALCIUM  9.1 8.3* 9.3 9.4  MG 1.6* 1.5* 1.9 1.8   Liver Function Tests: No results for input(s): "AST", "ALT", "ALKPHOS", "BILITOT", "PROT", "ALBUMIN" in the last 168 hours. No results for input(s): "LIPASE", "AMYLASE" in the last 168 hours. No results for input(s): "AMMONIA" in the last 168 hours. CBC: Recent Labs  Lab 11/15/23 0412 11/17/23 0500 11/18/23 0450  WBC 4.5 4.9 5.8  HGB 7.2* 7.7* 8.0*  HCT 22.1* 23.8* 24.1*  MCV 106.3* 106.3* 106.2*  PLT 35* 40* 41*   Cardiac Enzymes: No results for input(s): "CKTOTAL", "CKMB", "CKMBINDEX", "TROPONINI" in the last 168 hours. BNP: Invalid input(s):  "POCBNP" CBG: Recent Labs  Lab 11/20/23 1943 11/20/23 2347 11/21/23 0411 11/21/23 0752 11/21/23 1222  GLUCAP 290* 200* 235* 217* 164*   D-Dimer No results for input(s): "DDIMER" in the last 72 hours. Hgb A1c No results for input(s): "HGBA1C" in the last 72 hours. Lipid Profile No results for input(s): "CHOL", "HDL", "LDLCALC", "TRIG", "CHOLHDL", "LDLDIRECT" in the last 72 hours. Thyroid function studies No results for input(s): "TSH", "T4TOTAL", "T3FREE", "THYROIDAB" in the last 72 hours.  Invalid input(s): "FREET3" Anemia work up No results for input(s): "VITAMINB12", "FOLATE", "FERRITIN", "TIBC", "IRON", "RETICCTPCT" in the last 72 hours. Urinalysis    Component Value Date/Time   COLORURINE YELLOW 11/05/2023 1633   APPEARANCEUR CLOUDY (A) 11/05/2023 1633   LABSPEC >=1.030 11/05/2023 1633   PHURINE 5.0 11/05/2023 1633   GLUCOSEU NEGATIVE 11/05/2023 1633   HGBUR LARGE (A) 11/05/2023 1633   BILIRUBINUR LARGE (A) 11/05/2023 1633   KETONESUR TRACE (A) 11/05/2023 1633   PROTEINUR >=300 (A) 11/05/2023 1633   NITRITE POSITIVE (A) 11/05/2023 1633   LEUKOCYTESUR MODERATE (A) 11/05/2023 1633   Sepsis Labs Recent Labs  Lab 11/15/23 0412 11/17/23 0500 11/18/23 0450  WBC 4.5 4.9 5.8   Microbiology No results found for this or any previous visit (from the past 240 hours).  Procedures/Studies: CT HEAD WO CONTRAST ( ) Result Date: 11/09/2023 CLINICAL DATA:  Altered mental status.  History of glioblastoma. EXAM: CT HEAD WITHOUT CONTRAST TECHNIQUE: Contiguous axial images were obtained from the base of the skull through the vertex without intravenous contrast. RADIATION DOSE REDUCTION: This exam was performed according to the departmental dose-optimization program which includes automated exposure control, adjustment of the mA and/or kV according to patient  size and/or use of iterative reconstruction technique. COMPARISON:  Head CT 11/05/2023 FINDINGS: Brain: A resection cavity  and mild surrounding low-density in the left temporal lobe are unchanged. No acute infarct, intracranial hemorrhage, midline shift, or extra-axial fluid collection is identified. Patchy hypodensities elsewhere in the cerebral white matter bilaterally are unchanged and nonspecific but compatible with mild chronic small vessel ischemic disease. 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 There is mild cerebral atrophy. Vascular: No hyperdense vessel. Skull: Left temporoparietal craniotomy. Sinuses/Orbits: Minimal fluid or secretions in the right sphenoid sinus. Clear mastoid air cells. Unremarkable orbits. Other: None. IMPRESSION: 1. No evidence of acute intracranial abnormality. 2. Postoperative changes in the left temporal lobe. 3. Mild chronic small vessel ischemic disease. Electronically Signed   By: Aundra Lee M.D.   On: 11/09/2023 18:51   EEG adult Result Date: 11/09/2023 Arleene Lack, MD     11/09/2023  4:00 PM Patient Name: KATIYA CROMPTON MRN: [id number] Epilepsy Attending: Arleene Lack Referring Physician/Provider: Rosena Conradi, MD Date: 11/09/2023 Duration: 24.30 mins Patient history: 73yo F with h/o GBP s/p resection now with ams. EEG to evaluate with seizure.  Level of alertness: awake/ lethargic  AEDs during EEG study: LEV, LCM, Onfi   Technical aspects: This EEG study was done with scalp electrodes positioned according to the 10-20 International system of electrode placement. Electrical activity was reviewed with band pass filter of 1-70Hz , sensitivity of 7 uV/mm, display speed of 11mm/sec with a 60Hz  notched filter applied as appropriate. EEG data were recorded continuously and digitally stored.  Video monitoring was available and reviewed as appropriate.  Description: No clear posterior dominant rhythm was seen. EEG showed continuous generalized and maximal left temporal 5 to 6 Hz theta slowing admixed with intermittent 2-3hz  delta slowing. Sharp waves were noted  in left temporo-parietal region. Hyperventilation and photic stimulation were not performed.    ABNORMALITY - Sharp waves, left temporo-parietal region. 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 Additionally there is cortical dysfunction arising from left temporal region, likely secondary to underlying structural abnormality. Lastly there is mild to moderate diffuse encephalopathy. No seizures were seen throughout the recording.  Arleene Lack   DG Foot 2 Views Left Result Date: 11/07/2023 CLINICAL DATA:  Foot pain and swelling EXAM: LEFT FOOT - 2 VIEW COMPARISON:  None Available. FINDINGS: Calcaneal spurring and tarsal degenerative changes are noted. No acute fracture or dislocation is noted. No rows of changes are seen. IMPRESSION: Degenerative change without acute abnormality. Electronically Signed   By: Violeta Grey M.D.   On: 11/07/2023 14:30   DG Chest Port 1 View Result Date: 11/07/2023 CLINICAL DATA:  5626 Acute respiratory failure (HCC) 5626 EXAM: PORTABLE CHEST - 1 VIEW COMPARISON:  11/05/2023 FINDINGS: Feeding tube has been placed least as far as the stomach, tip not seen. Right arm PICC line to the mid right atrium. Minimal linear opacities at the left lung base. Right lung clear. Heart size and mediastinal contours are within normal limits. No effusion. Mild blunting of the left lateral costophrenic angle. IMPRESSION: Minimal left basilar atelectasis. Electronically Signed   By: Nicoletta Barrier M.D.   On: 11/07/2023 09:22   Overnight EEG with video Result Date: 11/07/2023 Arleene Lack, MD     11/08/2023  6:55 AM Patient Name: YARIELY DIGAETANO MRN: [id number] Epilepsy Attending: Arleene Lack Referring Physician/Provider: Iran Manna  Shari Daughters, MD Duration: 11/06/2023 1202 to 11/07/2023 1202  Patient history: 73yo F with h/o GBP s/p resection now with ams. EEG to  evaluate with seizure.  Level of alertness: comatose/lethargic  AEDs during EEG study: LEV, LCM, Onfi   Technical aspects: This EEG study was done with scalp electrodes positioned according to the 10-20 International system of electrode placement. Electrical activity was reviewed with band pass filter of 1-70Hz , sensitivity of 7 uV/mm, display speed of 81mm/sec with a 60Hz  notched filter applied as appropriate. EEG data were recorded continuously and digitally stored.  Video monitoring was available and reviewed as appropriate.  Description: EEG showed continuous generalized and maximal left temporal 3 to 6 Hz theta-delta slowing. Sharp waves were noted in left temporo-parietal region. Hyperventilation and photic stimulation were not performed.    ABNORMALITY - Sharp waves, left temporo-parietal region. 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 Additionally there is cortical dysfunction arising from left temporal region, likely secondary to underlying structural abnormality. Lastly there is moderate diffuse encephalopathy. No seizures were seen throughout the recording.  Priyanka Suzanne Erps   CT ABDOMEN PELVIS WO CONTRAST Result Date: 11/05/2023 CLINICAL DATA:  Sepsis altered EXAM: CT ABDOMEN AND PELVIS WITHOUT CONTRAST TECHNIQUE: Multidetector CT imaging of the abdomen and pelvis was performed following the standard protocol without IV contrast. RADIATION DOSE REDUCTION: This exam was performed according to the departmental dose-optimization program which includes automated exposure control, adjustment of the mA and/or kV according to patient size and/or use of iterative reconstruction technique. COMPARISON:  None Available. FINDINGS: Lower chest: Lung bases demonstrate mild dependent atelectasis. Vascular catheter tip in the low right atrium Hepatobiliary: No focal liver abnormality is  seen. Status post cholecystectomy. No biliary dilatation. Pancreas: Unremarkable. No pancreatic ductal dilatation or surrounding inflammatory changes. Spleen: Normal in size without focal abnormality. Adrenals/Urinary Tract: Adrenal glands are normal. Kidneys show no hydronephrosis. 4 mm stone in the right kidney. The bladder is decompressed by Foley catheter. Some perivesical stranding. Stomach/Bowel: Stomach nonenlarged. No dilated small bowel. No acute bowel wall thickening. Diverticular disease of the sigmoid colon. Moderate rectal distension by feces. Vascular/Lymphatic: Aortic atherosclerosis. No enlarged abdominal or pelvic lymph nodes. Reproductive: Hysterectomy.  No adnexal mass Other: Negative for pelvic effusion or free air. Small fat containing umbilical hernia. Anterior abdominal wall laxity with bulging fat. Musculoskeletal: No acute or suspicious osseous abnormality IMPRESSION: 1. The bladder is decompressed by Foley catheter. There is some perivesical stranding which could be due to cystitis. 2. Nonobstructing right kidney stone. 3. Diverticular disease of the sigmoid colon without acute inflammatory process. Moderate rectal distension by feces. 4. Aortic atherosclerosis. Aortic Atherosclerosis (ICD10-I70.0). Electronically Signed   By: Esmeralda Hedge M.D.   On: 11/05/2023 21:29   CT Head Wo Contrast Result Date: 11/05/2023 CLINICAL DATA:  Mental status change, unknown cause EXAM: CT HEAD WITHOUT CONTRAST TECHNIQUE: Contiguous axial images were obtained from the base of the skull through the vertex without intravenous contrast. RADIATION DOSE REDUCTION: This exam was performed according to the departmental dose-optimization program which includes automated exposure control, adjustment of the mA and/or kV according to patient size and/or use of iterative reconstruction technique. COMPARISON:  CT head 10/20/2023 FINDINGS: Brain: Patchy and confluent areas of decreased attenuation are noted throughout  the deep and periventricular white matter of the cerebral hemispheres bilaterally, compatible with chronic microvascular ischemic disease. Similar-appearing left temporoparietal lobe encephalomalacia. No evidence of  large-territorial acute infarction. No parenchymal hemorrhage. No mass lesion. No extra-axial collection. No mass effect or midline shift. No hydrocephalus. Basilar cisterns are patent. Vascular: No hyperdense vessel. Atherosclerotic calcifications are present within the cavernous internal carotid and vertebral arteries. Skull: No acute fracture or focal lesion. Sinuses/Orbits: Right sphenoid sinus mucosal thickening. Otherwise paranasal sinuses and mastoid air cells are clear. The orbits are unremarkable. Other: None. IMPRESSION: No acute intracranial abnormality. Electronically Signed   By: Morgane  Naveau M.D.   On: 11/05/2023 21:27   DG Chest Port 1 View Result Date: 11/05/2023 CLINICAL DATA:  Possible sepsis EXAM: PORTABLE CHEST 1 VIEW COMPARISON:  10/20/2023 FINDINGS: Low lung volumes. No focal airspace disease, pleural effusion or pneumothorax. Right upper extremity central venous catheter with the tip projecting at the right atrial IVC junction. Minimal atelectasis left base. IMPRESSION: Low lung volumes with minimal atelectasis at the left base. New right upper extremity central venous catheter with the tip projecting at the level of the right atrial IVC junction Electronically Signed   By: Esmeralda Hedge M.D.   On: 11/05/2023 17:50     Time coordinating discharge: Over 30 minutes    Faith Homes, MD  [facility] 11/21/2023, 4:01 PM

## 2023-11-21 NOTE — TOC Transition Note (Signed)
 Transition of Care [facility]) - Discharge Note   Patient Details  Name: Dana Morton MRN: [id number] Date of Birth: 23-Aug-1950  Transition of Care [facility]) CM/SW Contact:  Jarrell Merritts, LCSW Phone Number: 11/21/2023, 3:59 PM   Clinical Narrative:     [facility]  Anticipated DC date:?11/22/2023 Transport by: [facility]   CSW was notified that family will proceed with DC that had been scheduled for yesterday.Per MD patient ready for DC to [facility].. RN, patient, patient's family, and facility notified of DC. Discharge Summary sent to facility. RN given number for report  [phone]. DC packet on chart. Ambulance transport requested for patient.   CSW signing off.   Georgine Kitchens, [place] [phone]    Final next level of care: Skilled Nursing Facility Barriers to Discharge: No Barriers Identified   Patient Goals and CMS Choice            Discharge Placement                       Discharge Plan and Services Additional resources added to the After Visit Summary for   In-house Referral: Clinical Social Work   Post Acute Care Choice: Residential Hospice Bed                               Social Drivers of Health (SDOH) Interventions SDOH Screenings   Food Insecurity: No Food Insecurity (11/07/2023)  Housing: Low Risk  (11/07/2023)  Transportation Needs: Patient Unable To Answer (11/07/2023)  Utilities: Patient Unable To Answer (11/07/2023)  Financial Resource Strain: Low Risk  (10/03/2023)   Received from [facility]  Social Connections: Unknown (11/09/2023)  Tobacco Use: Low Risk  (11/05/2023)     Readmission Risk Interventions     No data to display

## 2023-11-22 DIAGNOSIS — Z7189 Other specified counseling: Secondary | ICD-10-CM | POA: Diagnosis not present

## 2023-11-22 DIAGNOSIS — R4701 Aphasia: Secondary | ICD-10-CM | POA: Diagnosis not present

## 2023-11-22 DIAGNOSIS — A419 Sepsis, unspecified organism: Secondary | ICD-10-CM | POA: Diagnosis not present

## 2023-11-22 DIAGNOSIS — G9341 Metabolic encephalopathy: Secondary | ICD-10-CM | POA: Diagnosis not present

## 2023-11-22 LAB — GLUCOSE, CAPILLARY
Glucose-Capillary: 228 mg/dL — ABNORMAL HIGH (ref 70–99)
Glucose-Capillary: 121 mg/dL — ABNORMAL HIGH (ref 70–99)

## 2023-11-22 NOTE — Plan of Care (Signed)

## 2023-11-22 NOTE — Progress Notes (Addendum)
 Arlin Benes [phone]  [facility] Liaison Note   Referral received for family interest in [facility].   Consents have been completed by husband.   RN staff, you may call and give updated report at any time to [facility] @ [phone].     Please leave IV intact and send completed DNR with patient.     Updated attending and [facility] manager via [employer].   Thank you for the opportunity to participate in this patient's care     Jacqlyn Matas, BSN, RN Hospice Nurse Liaison [phone]

## 2023-11-22 NOTE — Progress Notes (Addendum)
  Patient's discharge delayed yesterday. Patient will discharge to the [facility]. Please refer to transition note from yesterday with details.

## 2023-11-22 NOTE — Discharge Summary (Signed)
 Physician Discharge Summary   Dana Morton BMW:[id number] DOB: 09-14-1950 DOA: 11/05/2023  PCP: Melchor Spoon, MD  Admit date: 11/05/2023 Discharge date: 11/22/2023  Admitted From: Home Disposition:  Hospice house Discharging physician: Faith Homes, MD Barriers to discharge: none  Recommendations at discharge: Continue comfort care  Discharge Condition: poor CODE STATUS: DNR Diet recommendation:  Diet Orders (From admission, onward)     Start     Ordered   11/11/23 1523  Diet full liquid Room service appropriate? Yes with Assist; Fluid consistency: Thin  Diet effective now       Comments: Meds whole with puree; allow pt to feed herself with support  Question Answer Comment  Room service appropriate? 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   Patient was recently discharged from rehab facility on 4/14.  Patient was not taking her medications and food at home and had been bedbound.  According to husband, who was at the beside, she has been refusing her food and meds for several weeks.  In the ED patient was hypotensive and required vasopressors.  Lactic acid was 2.2 initially.  Creatinine was 3.2 from baseline 0.7. See below for further details.  A&P:  Acute metabolic encephalopathy H/o Seizures  History of glioblastoma s/p resection Failure to thrive Goals of care discussion - Continue Vimpat  and Keppra  - Continue dexamethasone  - have treated infection, nutrition, empiric tx for HSV with steroid/valtrex  and patient having no improvement; also s/p EEG/LTM with no active seizure activity - Given aggressive treatments and extensive workup dating back to January (but  much further still) along with underlying poor prognosis and functional decline, have discussed persistent decline with her husband.  She has not improved with maximal efforts and appears to be more appropriate for transitioning to hospice at this time; I have relayed this recommendation to him.  Also discussed case with neurology on 11/18/2023 and there is no further need for EEG monitoring; I do not think neuoro-onc will have much to offer - discussed at length with daughter on phone also on 5/8 - transitioned to comfort care after [facility] and family meeting - still no meaningful improvement on several days TF and no change in mentation; still agree with pursuit of hospice and de-escalation of care with focus on comfort  - Bedside discussion held with daughter with her husband on the phone on 11/21/2023; family now in agreement with transitioning to residential hospice  Septic shock, secondary to UTI - resolved Patient initially received vasopressors.  Foley catheter was changed.  S/p vancomycin  and meropenem  due to history of ESBL.  Urine culture with no growth.  Blood cultures negative  AKI on CKD - resolved   Acute hypoxic respiratory failure Currently on room air   Sacral decub, unstageable present on admission - not a good surgical candidate    Severe Calorie malnutrition, present on admission Refeeding syndrome - s/p cortrak tube and tube feeding - After family discussion, no further tube feeds at discharge   Anemia of chronic disease Thrombocytopenia Received 1 unit of packed RBC.  Significant thrombocytopenia   Left toe swelling Secondary to chronic gout  Hypothyroidism.  Continue Synthroid   Sacral decub, left unstageable present on admission, right buttocks stage II ulceration present on admission Pressure Injury 10/20/23 Buttocks Right Stage 2 -  Partial thickness loss of dermis presenting as a shallow open injury with a red, pink wound bed without slough. the area is pink in  the right buttocks (Active)  10/20/23 1500  Location: Buttocks  Location Orientation: Right  Staging: Stage 2 -  Partial thickness loss of dermis presenting as a shallow open injury with a red, pink wound bed without slough.  Wound Description (Comments): the area is pink in the right buttocks  Present on Admission: Yes     Pressure Injury 11/06/23 Buttocks Left Unstageable - Full thickness tissue loss in which the base of the injury is covered by slough (yellow, tan, gray, green or brown) and/or eschar (tan, brown or black) in the wound bed. Full tissue loss in which base  (Active)  11/06/23 0430  Location: Buttocks  Location Orientation: Left  Staging: Unstageable - Full thickness tissue loss in which the base of the injury is covered by slough (yellow, tan, gray, green or brown) and/or eschar (tan, brown or black) in the wound bed.  Wound Description (Comments): Full tissue loss in which base of injury covered by slough  Present on Admission: Yes   Wound care recommending surgical intervention and possible debridement in a nonurgent basis.   Hypokalemia.  Repleted   Hypomagnesemia.  Repleted   Class I obesity.Body mass index is 33.52 kg/m.  Nutrition on board.  On tube feeding at this time.   Concern for Herpes Zoster s/p Valtrex  1000 g 3 times daily for 7 days. Will d/c airborne precautions if no more rash; left thigh has crusted over   Principal Diagnosis: Septic shock [facility])  Discharge Diagnoses: Active Hospital Problems   Diagnosis Date Noted   Goals of care, counseling/discussion 11/18/2023    Priority: 2.   Acute metabolic encephalopathy 11/18/2023   Protein-calorie malnutrition, severe 11/07/2023   Hypokalemia 08/12/2023   Hypomagnesemia 08/12/2023   Seizure disorder (HCC) 08/07/2023   Glioblastoma (HCC) 08/06/2023   Aphasia 05/12/2023   Hypertension 09/22/2017   Diabetes mellitus type 2, uncomplicated (HCC) 09/22/2017   Hypothyroidism 09/22/2017    Resolved  Hospital Problems   Diagnosis Date Noted Date Resolved   Septic shock [facility]) 11/05/2023 11/18/2023     Discharge Instructions     Discharge wound care:   Complete by: As directed    Cleanse sacral wound with NS, apply Medihoney to wound bed daily, cover with dry gauze and silicone foam or ABD pad and tape whichever is preferred.   Discharge wound care:   Complete by: As directed    Cleanse sacral wound with NS, apply Medihoney to wound bed daily, cover with dry gauze and silicone foam or ABD pad and tape whichever is preferred.   Discharge wound care:   Complete by: As directed    Cleanse sacral wound with NS, apply Medihoney to wound bed daily, cover with dry gauze and silicone foam or ABD pad and tape whichever is preferred.   Increase activity slowly   Complete by: As directed       Allergies as of 11/22/2023       Reactions   Celebrex [celecoxib] Nausea Only   Codeine Nausea Only        Medication List     STOP taking these medications    bisoprolol  10 MG tablet Commonly known as: ZEBETA    CALCIUM  600+D PO   cetirizine  10 MG tablet Commonly known as: ZYRTEC   Cholecalciferol  25 MCG (1000 UT) tablet   cyanocobalamin  1000 MCG tablet   feeding supplement (PRO-STAT SUGAR FREE 64) Liqd   levothyroxine  137 MCG tablet Commonly known as: SYNTHROID    lisinopril  40 MG tablet Commonly known as: ZESTRIL    LORazepam  1 MG tablet Commonly known as: ATIVAN    lovastatin 40 MG tablet Commonly known as: MEVACOR   metroNIDAZOLE  500 MG tablet Commonly known as: FLAGYL    mirtazapine  7.5 MG tablet Commonly known as: REMERON    multivitamin with minerals Tabs tablet   omeprazole 20 MG capsule Commonly known as: PRILOSEC   polyethylene glycol 17 g packet Commonly known as: MIRALAX  / GLYCOLAX    predniSONE  20 MG tablet Commonly known as: DELTASONE    senna-docusate 8.6-50 MG tablet Commonly known as: Senokot-S   sulfamethoxazole-trimethoprim 800-160 MG  tablet Commonly known as: BACTRIM DS       TAKE these medications    cloBAZam  10 MG tablet Commonly known as: ONFI  Place 1 tablet (10 mg total) into feeding tube 2 (two) times daily. What changed: how to take this   dexamethasone  1.5 MG tablet Commonly known as: DECADRON  Place 2 tablets (3 mg total) into feeding tube daily. What changed: how to take this   fluconazole  40 MG/ML suspension Commonly known as: DIFLUCAN  Place 5 mLs (200 mg total) into feeding tube daily for 3 days.   lacosamide  200 MG Tabs tablet Commonly known as: VIMPAT  Place 1 tablet (200 mg total) into feeding tube 2 (two) times daily. What changed: how to take this   levETIRAcetam  1000 MG tablet Commonly known as: KEPPRA  Place 1 tablet (1,000 mg total) into feeding tube 2 (two) times daily. What changed:  medication strength how much to take how to take this   mouth rinse Liqd solution 15 mLs by Mouth Rinse route as needed (oral care).   white petrolatum Oint Commonly known as: VASELINE Apply 1 Application topically as needed for lip care (.).               Discharge Care Instructions  (From admission, onward)           Start     Ordered   11/22/23 0000  Discharge wound care:       Comments: Cleanse sacral wound with NS, apply Medihoney to wound bed daily, cover with dry gauze and silicone foam or ABD pad and tape whichever is preferred.   11/22/23 0909   11/21/23 0000  Discharge wound care:       Comments: Cleanse sacral wound with NS, apply Medihoney to wound bed daily, cover with dry gauze and silicone foam or ABD pad and tape whichever is preferred.   11/21/23 1601   11/20/23 0000  Discharge wound care:       Comments: Cleanse sacral wound with NS, apply Medihoney to wound bed daily, cover with dry gauze and silicone foam or ABD pad and tape whichever is preferred.   11/20/23 1450            Allergies  Allergen Reactions   Celebrex [Celecoxib] Nausea Only   Codeine  Nausea Only    Consultations: Palliative care  Procedures:   Discharge Exam: BP 135/71 (BP Location: Left Wrist)   Pulse 83   Temp 98.7 F (37.1 C) (Oral)   Resp 19   Ht 5\' 7"  (1.702 m)   Wt 96.6 kg   SpO2 98%   BMI 33.36 kg/m  Physical Exam Constitutional:  Comments: Chronically ill-appearing elderly woman lying in bed appearing uncomfortable.  Unable to speak or interact meaningfully  HENT:     Head: Normocephalic and atraumatic.     Mouth/Throat:     Mouth: Mucous membranes are moist.  Eyes:     Pupils: Pupils are equal, round, and reactive to light.  Cardiovascular:     Rate and Rhythm: Normal rate and regular rhythm.  Pulmonary:     Effort: Pulmonary effort is normal. No respiratory distress.     Breath sounds: Normal breath sounds. No wheezing.  Abdominal:     General: Bowel sounds are normal. There is no distension.     Palpations: Abdomen is soft.     Tenderness: There is no abdominal tenderness.  Musculoskeletal:        General: No swelling.     Cervical back: Normal range of motion and neck supple.  Skin:    General: Skin is warm and dry.  Neurological:     Comments: Did not follow any commands for me today      The results of significant diagnostics from this hospitalization (including imaging, microbiology, ancillary and laboratory) are listed below for reference.   Microbiology: No results found for this or any previous visit (from the past 240 hours).   Labs: BNP (last 3 results) No results for input(s): "BNP" in the last 8760 hours. Basic Metabolic Panel: Recent Labs  Lab 11/16/23 0830 11/17/23 0500 11/18/23 0450  NA 143 139 139  K 3.2* 3.7 3.8  CL 110 102 102  CO2 26 28 27   GLUCOSE 117* 119* 185*  BUN 34* 37* 39*  CREATININE 0.45 0.52 0.60  CALCIUM  8.3* 9.3 9.4  MG 1.5* 1.9 1.8   Liver Function Tests: No results for input(s): "AST", "ALT", "ALKPHOS", "BILITOT", "PROT", "ALBUMIN" in the last 168 hours. No results for  input(s): "LIPASE", "AMYLASE" in the last 168 hours. No results for input(s): "AMMONIA" in the last 168 hours. CBC: Recent Labs  Lab 11/17/23 0500 11/18/23 0450  WBC 4.9 5.8  HGB 7.7* 8.0*  HCT 23.8* 24.1*  MCV 106.3* 106.2*  PLT 40* 41*   Cardiac Enzymes: No results for input(s): "CKTOTAL", "CKMB", "CKMBINDEX", "TROPONINI" in the last 168 hours. BNP: Invalid input(s): "POCBNP" CBG: Recent Labs  Lab 11/21/23 1635 11/21/23 1945 11/21/23 2352 11/22/23 0337 11/22/23 0819  GLUCAP 188* 281* 232* 228* 121*   D-Dimer No results for input(s): "DDIMER" in the last 72 hours. Hgb A1c No results for input(s): "HGBA1C" in the last 72 hours. Lipid Profile No results for input(s): "CHOL", "HDL", "LDLCALC", "TRIG", "CHOLHDL", "LDLDIRECT" in the last 72 hours. Thyroid function studies No results for input(s): "TSH", "T4TOTAL", "T3FREE", "THYROIDAB" in the last 72 hours.  Invalid input(s): "FREET3" Anemia work up No results for input(s): "VITAMINB12", "FOLATE", "FERRITIN", "TIBC", "IRON", "RETICCTPCT" in the last 72 hours. Urinalysis    Component Value Date/Time   COLORURINE YELLOW 11/05/2023 1633   APPEARANCEUR CLOUDY (A) 11/05/2023 1633   LABSPEC >=1.030 11/05/2023 1633   PHURINE 5.0 11/05/2023 1633   GLUCOSEU NEGATIVE 11/05/2023 1633   HGBUR LARGE (A) 11/05/2023 1633   BILIRUBINUR LARGE (A) 11/05/2023 1633   KETONESUR TRACE (A) 11/05/2023 1633   PROTEINUR >=300 (A) 11/05/2023 1633   NITRITE POSITIVE (A) 11/05/2023 1633   LEUKOCYTESUR MODERATE (A) 11/05/2023 1633   Sepsis Labs Recent Labs  Lab 11/17/23 0500 11/18/23 0450  WBC 4.9 5.8   Microbiology No results found for this or any previous visit (from the past  240 hours).  Procedures/Studies: CT HEAD WO CONTRAST ( ) Result Date: 11/09/2023 CLINICAL DATA:  Altered mental status.  History of glioblastoma. EXAM: CT HEAD WITHOUT CONTRAST TECHNIQUE: Contiguous axial images were obtained from the base of the skull  through the vertex without intravenous contrast. RADIATION DOSE REDUCTION: This exam was performed according to the departmental dose-optimization program which includes automated exposure control, adjustment of the mA and/or kV according to patient size and/or use of iterative reconstruction technique. COMPARISON:  Head CT 11/05/2023 FINDINGS: Brain: A resection cavity and mild surrounding low-density in the left temporal lobe are unchanged. No acute infarct, intracranial hemorrhage, midline shift, or extra-axial fluid collection is identified. Patchy hypodensities elsewhere in the cerebral white matter bilaterally are unchanged and nonspecific but compatible with mild chronic small vessel ischemic disease. 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 There is mild cerebral atrophy. Vascular: No hyperdense vessel. Skull: Left temporoparietal craniotomy. Sinuses/Orbits: Minimal fluid or secretions in the right sphenoid sinus. Clear mastoid air cells. Unremarkable orbits. Other: None. IMPRESSION: 1. No evidence of acute intracranial abnormality. 2. Postoperative changes in the left temporal lobe. 3. Mild chronic small vessel ischemic disease. Electronically Signed   By: Aundra Lee M.D.   On: 11/09/2023 18:51   EEG adult Result Date: 11/09/2023 Arleene Lack, MD     11/09/2023  4:00 PM Patient Name: SHIMERE LEVERE MRN: [id number] Epilepsy Attending: Arleene Lack Referring Physician/Provider: Rosena Conradi, MD Date: 11/09/2023 Duration: 24.30 mins Patient history: 73yo F with h/o GBP s/p resection now with ams. EEG to evaluate with seizure.  Level of alertness: awake/ lethargic  AEDs during EEG study: LEV, LCM, Onfi   Technical aspects: This EEG study was done with scalp electrodes positioned according to the 10-20 International system of electrode placement. Electrical activity was reviewed with band pass filter of 1-70Hz , sensitivity of 7 uV/mm, display speed of 97mm/sec with a  60Hz  notched filter applied as appropriate. EEG data were recorded continuously and digitally stored.  Video monitoring was available and reviewed as appropriate.  Description: No clear posterior dominant rhythm was seen. EEG showed continuous generalized and maximal left temporal 5 to 6 Hz theta slowing admixed with intermittent 2-3hz  delta slowing. Sharp waves were noted in left temporo-parietal region. Hyperventilation and photic stimulation were not performed.    ABNORMALITY - Sharp waves, left temporo-parietal region. 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 Additionally there is cortical dysfunction arising from left temporal region, likely secondary to underlying structural abnormality. Lastly there is mild to moderate diffuse encephalopathy. No seizures were seen throughout the recording.  Arleene Lack   DG Foot 2 Views Left Result Date: 11/07/2023 CLINICAL DATA:  Foot pain and swelling EXAM: LEFT FOOT - 2 VIEW COMPARISON:  None Available. FINDINGS: Calcaneal spurring and tarsal degenerative changes are noted. No acute fracture or dislocation is noted. No rows of changes are seen. IMPRESSION: Degenerative change without acute abnormality. Electronically Signed   By: Violeta Grey M.D.   On: 11/07/2023 14:30   DG Chest Port 1 View Result Date: 11/07/2023 CLINICAL DATA:  5626 Acute respiratory failure (HCC) 5626 EXAM: PORTABLE CHEST - 1 VIEW COMPARISON:  11/05/2023 FINDINGS: Feeding tube has been placed least as far as the stomach, tip not seen. Right arm PICC line to the mid right atrium. Minimal linear opacities at the left lung base. Right lung clear. Heart size  and mediastinal contours are within normal limits. No effusion. Mild blunting of the left lateral costophrenic angle. IMPRESSION: Minimal left basilar atelectasis. Electronically Signed   By: Nicoletta Barrier M.D.    On: 11/07/2023 09:22   Overnight EEG with video Result Date: 11/07/2023 Arleene Lack, MD     11/08/2023  6:55 AM Patient Name: LINSAY ARNO MRN: [id number] Epilepsy Attending: Arleene Lack Referring Physician/Provider: Ogan, Okoronkwo U, MD Duration: 11/06/2023 1202 to 11/07/2023 1202  Patient history: 73yo F with h/o GBP s/p resection now with ams. EEG to evaluate with seizure.  Level of alertness: comatose/lethargic  AEDs during EEG study: LEV, LCM, Onfi   Technical aspects: This EEG study was done with scalp electrodes positioned according to the 10-20 International system of electrode placement. Electrical activity was reviewed with band pass filter of 1-70Hz , sensitivity of 7 uV/mm, display speed of 66mm/sec with a 60Hz  notched filter applied as appropriate. EEG data were recorded continuously and digitally stored.  Video monitoring was available and reviewed as appropriate.  Description: EEG showed continuous generalized and maximal left temporal 3 to 6 Hz theta-delta slowing. Sharp waves were noted in left temporo-parietal region. Hyperventilation and photic stimulation were not performed.    ABNORMALITY - Sharp waves, left temporo-parietal region. 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 Additionally there is cortical dysfunction arising from left temporal region, likely secondary to underlying structural abnormality. Lastly there is moderate diffuse encephalopathy. No seizures were seen throughout the recording.  Priyanka Suzanne Erps   CT ABDOMEN PELVIS WO CONTRAST Result Date: 11/05/2023 CLINICAL DATA:  Sepsis altered EXAM: CT ABDOMEN AND PELVIS WITHOUT CONTRAST TECHNIQUE: Multidetector CT imaging of the abdomen and pelvis was performed following the standard protocol without IV contrast. RADIATION DOSE REDUCTION: This exam was performed according to the departmental  dose-optimization program which includes automated exposure control, adjustment of the mA and/or kV according to patient size and/or use of iterative reconstruction technique. COMPARISON:  None Available. FINDINGS: Lower chest: Lung bases demonstrate mild dependent atelectasis. Vascular catheter tip in the low right atrium Hepatobiliary: No focal liver abnormality is seen. Status post cholecystectomy. No biliary dilatation. Pancreas: Unremarkable. No pancreatic ductal dilatation or surrounding inflammatory changes. Spleen: Normal in size without focal abnormality. Adrenals/Urinary Tract: Adrenal glands are normal. Kidneys show no hydronephrosis. 4 mm stone in the right kidney. The bladder is decompressed by Foley catheter. Some perivesical stranding. Stomach/Bowel: Stomach nonenlarged. No dilated small bowel. No acute bowel wall thickening. Diverticular disease of the sigmoid colon. Moderate rectal distension by feces. Vascular/Lymphatic: Aortic atherosclerosis. No enlarged abdominal or pelvic lymph nodes. Reproductive: Hysterectomy.  No adnexal mass Other: Negative for pelvic effusion or free air. Small fat containing umbilical hernia. Anterior abdominal wall laxity with bulging fat. Musculoskeletal: No acute or suspicious osseous abnormality IMPRESSION: 1. The bladder is decompressed by Foley catheter. There is some perivesical stranding which could be due to cystitis. 2. Nonobstructing right kidney stone. 3. Diverticular disease of the sigmoid colon without acute inflammatory process. Moderate rectal distension by feces. 4. Aortic atherosclerosis. Aortic Atherosclerosis (ICD10-I70.0). Electronically Signed   By: Esmeralda Hedge M.D.   On: 11/05/2023 21:29   CT Head Wo Contrast Result Date: 11/05/2023 CLINICAL DATA:  Mental status change, unknown cause EXAM: CT HEAD WITHOUT CONTRAST TECHNIQUE: Contiguous axial images were obtained from the base of the skull through the vertex without intravenous contrast.  RADIATION DOSE REDUCTION: This  exam was performed according to the departmental dose-optimization program which includes automated exposure control, adjustment of the mA and/or kV according to patient size and/or use of iterative reconstruction technique. COMPARISON:  CT head 10/20/2023 FINDINGS: Brain: Patchy and confluent areas of decreased attenuation are noted throughout the deep and periventricular white matter of the cerebral hemispheres bilaterally, compatible with chronic microvascular ischemic disease. Similar-appearing left temporoparietal lobe encephalomalacia. No evidence of large-territorial acute infarction. No parenchymal hemorrhage. No mass lesion. No extra-axial collection. No mass effect or midline shift. No hydrocephalus. Basilar cisterns are patent. Vascular: No hyperdense vessel. Atherosclerotic calcifications are present within the cavernous internal carotid and vertebral arteries. Skull: No acute fracture or focal lesion. Sinuses/Orbits: Right sphenoid sinus mucosal thickening. Otherwise paranasal sinuses and mastoid air cells are clear. The orbits are unremarkable. Other: None. IMPRESSION: No acute intracranial abnormality. Electronically Signed   By: Morgane  Naveau M.D.   On: 11/05/2023 21:27   DG Chest Port 1 View Result Date: 11/05/2023 CLINICAL DATA:  Possible sepsis EXAM: PORTABLE CHEST 1 VIEW COMPARISON:  10/20/2023 FINDINGS: Low lung volumes. No focal airspace disease, pleural effusion or pneumothorax. Right upper extremity central venous catheter with the tip projecting at the right atrial IVC junction. Minimal atelectasis left base. IMPRESSION: Low lung volumes with minimal atelectasis at the left base. New right upper extremity central venous catheter with the tip projecting at the level of the right atrial IVC junction Electronically Signed   By: Esmeralda Hedge M.D.   On: 11/05/2023 17:50     Time coordinating discharge: Over 30 minutes    Faith Homes, MD  [facility] 11/22/2023, 9:09 AM

## 2023-12-13 DEATH — deceased
